# Patient Record
Sex: Male | Born: 1965 | Race: White | Hispanic: No | Marital: Single | Smoking: Never smoker
Health system: Southern US, Community
[De-identification: ages and names within clinical notes are randomized; demographics above are authoritative.]

## PROBLEM LIST (undated history)

## (undated) DIAGNOSIS — I7100 Dissection of unspecified site of aorta: Secondary | ICD-10-CM

## (undated) DIAGNOSIS — K219 Gastro-esophageal reflux disease without esophagitis: Secondary | ICD-10-CM

## (undated) DIAGNOSIS — I714 Abdominal aortic aneurysm, without rupture, unspecified: Secondary | ICD-10-CM

## (undated) DIAGNOSIS — I6529 Occlusion and stenosis of unspecified carotid artery: Secondary | ICD-10-CM

## (undated) DIAGNOSIS — I719 Aortic aneurysm of unspecified site, without rupture: Secondary | ICD-10-CM

## (undated) DIAGNOSIS — H269 Unspecified cataract: Secondary | ICD-10-CM

## (undated) DIAGNOSIS — N189 Chronic kidney disease, unspecified: Secondary | ICD-10-CM

## (undated) DIAGNOSIS — I639 Cerebral infarction, unspecified: Secondary | ICD-10-CM

## (undated) DIAGNOSIS — E786 Lipoprotein deficiency: Secondary | ICD-10-CM

## (undated) DIAGNOSIS — I10 Essential (primary) hypertension: Secondary | ICD-10-CM

## (undated) DIAGNOSIS — I480 Paroxysmal atrial fibrillation: Secondary | ICD-10-CM

## (undated) DIAGNOSIS — Z5189 Encounter for other specified aftercare: Secondary | ICD-10-CM

## (undated) DIAGNOSIS — D649 Anemia, unspecified: Secondary | ICD-10-CM

## (undated) DIAGNOSIS — G4730 Sleep apnea, unspecified: Secondary | ICD-10-CM

## (undated) DIAGNOSIS — Z952 Presence of prosthetic heart valve: Secondary | ICD-10-CM

## (undated) DIAGNOSIS — R911 Solitary pulmonary nodule: Secondary | ICD-10-CM

## (undated) DIAGNOSIS — E785 Hyperlipidemia, unspecified: Secondary | ICD-10-CM

## (undated) DIAGNOSIS — I499 Cardiac arrhythmia, unspecified: Secondary | ICD-10-CM

## (undated) DIAGNOSIS — F419 Anxiety disorder, unspecified: Secondary | ICD-10-CM

## (undated) DIAGNOSIS — D689 Coagulation defect, unspecified: Secondary | ICD-10-CM

## (undated) DIAGNOSIS — K760 Fatty (change of) liver, not elsewhere classified: Secondary | ICD-10-CM

## (undated) DIAGNOSIS — R011 Cardiac murmur, unspecified: Secondary | ICD-10-CM

## (undated) DIAGNOSIS — I4891 Unspecified atrial fibrillation: Secondary | ICD-10-CM

## (undated) DIAGNOSIS — I7103 Dissection of thoracoabdominal aorta: Secondary | ICD-10-CM

## (undated) DIAGNOSIS — F32A Depression, unspecified: Secondary | ICD-10-CM

## (undated) DIAGNOSIS — Z9889 Other specified postprocedural states: Secondary | ICD-10-CM

## (undated) DIAGNOSIS — E611 Iron deficiency: Secondary | ICD-10-CM

## (undated) DIAGNOSIS — Z7901 Long term (current) use of anticoagulants: Secondary | ICD-10-CM

## (undated) DIAGNOSIS — I509 Heart failure, unspecified: Secondary | ICD-10-CM

## (undated) DIAGNOSIS — I359 Nonrheumatic aortic valve disorder, unspecified: Secondary | ICD-10-CM

## (undated) DIAGNOSIS — I2510 Atherosclerotic heart disease of native coronary artery without angina pectoris: Secondary | ICD-10-CM

## (undated) HISTORY — DX: Iron deficiency: E61.1

## (undated) HISTORY — DX: Encounter for other specified aftercare: Z51.89

## (undated) HISTORY — DX: Depression, unspecified: F32.A

## (undated) HISTORY — DX: Coagulation defect, unspecified: D68.9

## (undated) HISTORY — DX: Dissection of thoracoabdominal aorta: I71.03

## (undated) HISTORY — DX: Sleep apnea, unspecified: G47.30

## (undated) HISTORY — DX: Anxiety disorder, unspecified: F41.9

## (undated) HISTORY — PX: EYE SURGERY: SHX253

## (undated) HISTORY — PX: CARDIAC VALVE REPLACEMENT: SHX585

## (undated) HISTORY — DX: Anemia, unspecified: D64.9

## (undated) HISTORY — DX: Cardiac murmur, unspecified: R01.1

## (undated) HISTORY — DX: Paroxysmal atrial fibrillation: I48.0

## (undated) HISTORY — DX: Unspecified cataract: H26.9

## (undated) HISTORY — DX: Long term (current) use of anticoagulants: Z79.01

## (undated) HISTORY — PX: ARTHROSCOPIC REPAIR ACL: SUR80

## (undated) HISTORY — DX: Abdominal aortic aneurysm, without rupture, unspecified: I71.40

## (undated) HISTORY — DX: Dissection of unspecified site of aorta: I71.00

## (undated) HISTORY — DX: Lipoprotein deficiency: E78.6

## (undated) HISTORY — DX: Cerebral infarction, unspecified: I63.9

## (undated) HISTORY — DX: Chronic kidney disease, unspecified: N18.9

## (undated) HISTORY — DX: Presence of prosthetic heart valve: Z95.2

## (undated) HISTORY — PX: ABDOMINAL SURGERY: SHX537

## (undated) HISTORY — DX: Hyperlipidemia, unspecified: E78.5

## (undated) HISTORY — DX: Solitary pulmonary nodule: R91.1

## (undated) HISTORY — DX: Occlusion and stenosis of unspecified carotid artery: I65.29

## (undated) HISTORY — DX: Nonrheumatic aortic valve disorder, unspecified: I35.9

## (undated) HISTORY — DX: Aortic aneurysm of unspecified site, without rupture: I71.9

## (undated) HISTORY — PX: ABDOMINAL AORTIC ANEURYSM REPAIR: SUR1152

## (undated) HISTORY — DX: Gastro-esophageal reflux disease without esophagitis: K21.9

## (undated) HISTORY — DX: Unspecified atrial fibrillation: I48.91

## (undated) HISTORY — PX: CORONARY ARTERY BYPASS GRAFT: SHX141

## (undated) HISTORY — DX: Other specified postprocedural states: Z98.890

## (undated) HISTORY — PX: CORONARY ANGIOPLASTY: SHX604

## (undated) HISTORY — PX: MENISCUS REPAIR: SHX5179

## (undated) HISTORY — DX: Cardiac arrhythmia, unspecified: I49.9

## (undated) HISTORY — DX: Fatty (change of) liver, not elsewhere classified: K76.0

## (undated) HISTORY — DX: Atherosclerotic heart disease of native coronary artery without angina pectoris: I25.10

## (undated) HISTORY — DX: Essential (primary) hypertension: I10

---

## 1999-06-16 HISTORY — PX: AORTIC VALVE REPLACEMENT: SHX41

## 2009-08-02 DIAGNOSIS — R7301 Impaired fasting glucose: Secondary | ICD-10-CM | POA: Insufficient documentation

## 2009-08-02 DIAGNOSIS — Z7901 Long term (current) use of anticoagulants: Secondary | ICD-10-CM | POA: Insufficient documentation

## 2011-06-30 DIAGNOSIS — I7103 Dissection of thoracoabdominal aorta: Secondary | ICD-10-CM | POA: Insufficient documentation

## 2013-10-16 DIAGNOSIS — E785 Hyperlipidemia, unspecified: Secondary | ICD-10-CM | POA: Insufficient documentation

## 2013-10-16 DIAGNOSIS — E782 Mixed hyperlipidemia: Secondary | ICD-10-CM | POA: Insufficient documentation

## 2014-03-15 DIAGNOSIS — Z952 Presence of prosthetic heart valve: Secondary | ICD-10-CM | POA: Insufficient documentation

## 2015-02-08 DIAGNOSIS — R001 Bradycardia, unspecified: Secondary | ICD-10-CM | POA: Insufficient documentation

## 2016-02-10 DIAGNOSIS — R3129 Other microscopic hematuria: Secondary | ICD-10-CM | POA: Insufficient documentation

## 2017-02-02 DIAGNOSIS — H53132 Sudden visual loss, left eye: Secondary | ICD-10-CM | POA: Insufficient documentation

## 2017-06-15 HISTORY — PX: COLONOSCOPY: SHX174

## 2018-02-16 DIAGNOSIS — E876 Hypokalemia: Secondary | ICD-10-CM | POA: Insufficient documentation

## 2018-02-17 DIAGNOSIS — K648 Other hemorrhoids: Secondary | ICD-10-CM | POA: Insufficient documentation

## 2018-08-13 DIAGNOSIS — S7012XA Contusion of left thigh, initial encounter: Secondary | ICD-10-CM | POA: Insufficient documentation

## 2018-11-12 DIAGNOSIS — R918 Other nonspecific abnormal finding of lung field: Secondary | ICD-10-CM | POA: Insufficient documentation

## 2018-12-12 DIAGNOSIS — R0683 Snoring: Secondary | ICD-10-CM | POA: Insufficient documentation

## 2019-10-08 DIAGNOSIS — R7989 Other specified abnormal findings of blood chemistry: Secondary | ICD-10-CM | POA: Insufficient documentation

## 2019-10-08 DIAGNOSIS — I480 Paroxysmal atrial fibrillation: Secondary | ICD-10-CM | POA: Insufficient documentation

## 2019-10-08 DIAGNOSIS — R778 Other specified abnormalities of plasma proteins: Secondary | ICD-10-CM | POA: Insufficient documentation

## 2019-10-08 DIAGNOSIS — R55 Syncope and collapse: Secondary | ICD-10-CM | POA: Insufficient documentation

## 2019-10-08 DIAGNOSIS — N182 Chronic kidney disease, stage 2 (mild): Secondary | ICD-10-CM | POA: Insufficient documentation

## 2019-10-08 DIAGNOSIS — G4733 Obstructive sleep apnea (adult) (pediatric): Secondary | ICD-10-CM | POA: Insufficient documentation

## 2019-10-08 DIAGNOSIS — Z8679 Personal history of other diseases of the circulatory system: Secondary | ICD-10-CM | POA: Insufficient documentation

## 2019-10-18 DIAGNOSIS — U071 COVID-19: Secondary | ICD-10-CM | POA: Insufficient documentation

## 2019-10-18 DIAGNOSIS — R791 Abnormal coagulation profile: Secondary | ICD-10-CM | POA: Insufficient documentation

## 2020-01-05 DIAGNOSIS — R079 Chest pain, unspecified: Secondary | ICD-10-CM | POA: Insufficient documentation

## 2020-01-30 DIAGNOSIS — S060X0A Concussion without loss of consciousness, initial encounter: Secondary | ICD-10-CM | POA: Insufficient documentation

## 2020-02-05 DIAGNOSIS — K625 Hemorrhage of anus and rectum: Secondary | ICD-10-CM | POA: Insufficient documentation

## 2020-02-07 DIAGNOSIS — Z8673 Personal history of transient ischemic attack (TIA), and cerebral infarction without residual deficits: Secondary | ICD-10-CM | POA: Insufficient documentation

## 2020-02-08 DIAGNOSIS — D62 Acute posthemorrhagic anemia: Secondary | ICD-10-CM | POA: Insufficient documentation

## 2020-02-16 DIAGNOSIS — H9319 Tinnitus, unspecified ear: Secondary | ICD-10-CM | POA: Insufficient documentation

## 2020-02-16 DIAGNOSIS — K5790 Diverticulosis of intestine, part unspecified, without perforation or abscess without bleeding: Secondary | ICD-10-CM | POA: Insufficient documentation

## 2020-03-12 DIAGNOSIS — F0781 Postconcussional syndrome: Secondary | ICD-10-CM | POA: Insufficient documentation

## 2020-03-21 DIAGNOSIS — H40013 Open angle with borderline findings, low risk, bilateral: Secondary | ICD-10-CM | POA: Insufficient documentation

## 2020-03-21 DIAGNOSIS — H2520 Age-related cataract, morgagnian type, unspecified eye: Secondary | ICD-10-CM | POA: Insufficient documentation

## 2020-05-21 DIAGNOSIS — H59022 Cataract (lens) fragments in eye following cataract surgery, left eye: Secondary | ICD-10-CM | POA: Insufficient documentation

## 2020-11-20 DIAGNOSIS — N3001 Acute cystitis with hematuria: Secondary | ICD-10-CM | POA: Insufficient documentation

## 2020-11-20 DIAGNOSIS — K5901 Slow transit constipation: Secondary | ICD-10-CM | POA: Insufficient documentation

## 2020-11-20 HISTORY — DX: Acute cystitis with hematuria: N30.01

## 2021-08-14 DIAGNOSIS — I219 Acute myocardial infarction, unspecified: Secondary | ICD-10-CM | POA: Insufficient documentation

## 2021-08-14 DIAGNOSIS — I517 Cardiomegaly: Secondary | ICD-10-CM | POA: Insufficient documentation

## 2021-08-14 DIAGNOSIS — G459 Transient cerebral ischemic attack, unspecified: Secondary | ICD-10-CM | POA: Insufficient documentation

## 2021-08-14 DIAGNOSIS — I10 Essential (primary) hypertension: Secondary | ICD-10-CM | POA: Insufficient documentation

## 2021-08-14 DIAGNOSIS — I7100 Dissection of unspecified site of aorta: Secondary | ICD-10-CM | POA: Insufficient documentation

## 2021-08-14 DIAGNOSIS — I7789 Other specified disorders of arteries and arterioles: Secondary | ICD-10-CM | POA: Insufficient documentation

## 2021-08-15 ENCOUNTER — Other Ambulatory Visit: Payer: Self-pay

## 2021-08-15 ENCOUNTER — Encounter: Payer: Self-pay | Admitting: Internal Medicine

## 2021-08-15 ENCOUNTER — Ambulatory Visit (INDEPENDENT_AMBULATORY_CARE_PROVIDER_SITE_OTHER): Payer: Medicaid Other | Admitting: Internal Medicine

## 2021-08-15 ENCOUNTER — Other Ambulatory Visit
Admission: RE | Admit: 2021-08-15 | Discharge: 2021-08-15 | Disposition: A | Discharge status: Home / Self Care | Payer: Medicaid Other | Source: Ambulatory Visit | Attending: Internal Medicine | Admitting: Internal Medicine

## 2021-08-15 VITALS — BP 120/78 | HR 61 | Ht 68.0 in | Wt 176.2 lb

## 2021-08-15 DIAGNOSIS — I719 Aortic aneurysm of unspecified site, without rupture: Secondary | ICD-10-CM | POA: Insufficient documentation

## 2021-08-15 DIAGNOSIS — I480 Paroxysmal atrial fibrillation: Secondary | ICD-10-CM

## 2021-08-15 DIAGNOSIS — I10 Essential (primary) hypertension: Secondary | ICD-10-CM | POA: Diagnosis present

## 2021-08-15 DIAGNOSIS — I7100 Dissection of unspecified site of aorta: Secondary | ICD-10-CM | POA: Insufficient documentation

## 2021-08-15 DIAGNOSIS — Z7901 Long term (current) use of anticoagulants: Secondary | ICD-10-CM | POA: Diagnosis present

## 2021-08-15 DIAGNOSIS — I219 Acute myocardial infarction, unspecified: Secondary | ICD-10-CM | POA: Insufficient documentation

## 2021-08-15 DIAGNOSIS — I252 Old myocardial infarction: Secondary | ICD-10-CM | POA: Diagnosis not present

## 2021-08-15 DIAGNOSIS — Z952 Presence of prosthetic heart valve: Secondary | ICD-10-CM | POA: Insufficient documentation

## 2021-08-15 DIAGNOSIS — I38 Endocarditis, valve unspecified: Secondary | ICD-10-CM | POA: Insufficient documentation

## 2021-08-15 LAB — LIPID PANEL
Cholesterol: 88 mg/dL (ref 0–200)
HDL: 31 mg/dL — ABNORMAL LOW (ref >40)
LDL Cholesterol: 24 mg/dL (ref 0–99)
Total CHOL/HDL Ratio: 2.8 RATIO
Triglycerides: 165 mg/dL — ABNORMAL HIGH (ref <150)
VLDL: 33 mg/dL (ref 0–40)

## 2021-08-15 LAB — COMPREHENSIVE METABOLIC PANEL
ALT: 18 U/L (ref 0–44)
AST: 26 U/L (ref 15–41)
Albumin: 3.9 g/dL (ref 3.5–5.0)
Alkaline Phosphatase: 49 U/L (ref 38–126)
Anion gap: 3 — ABNORMAL LOW (ref 5–15)
BUN: 13 mg/dL (ref 6–20)
CO2: 28 mmol/L (ref 22–32)
Calcium: 8.7 mg/dL — ABNORMAL LOW (ref 8.9–10.3)
Chloride: 105 mmol/L (ref 98–111)
Creatinine, Ser: 1.07 mg/dL (ref 0.61–1.24)
GFR, Estimated: >60 mL/min (ref >60)
Glucose, Bld: 104 mg/dL — ABNORMAL HIGH (ref 70–99)
Potassium: 3.7 mmol/L (ref 3.5–5.1)
Sodium: 136 mmol/L (ref 135–145)
Total Bilirubin: 0.7 mg/dL (ref 0.3–1.2)
Total Protein: 6.9 g/dL (ref 6.5–8.1)

## 2021-08-15 LAB — CBC
HCT: 37.8 % — ABNORMAL LOW (ref 39.0–52.0)
Hemoglobin: 12.7 g/dL — ABNORMAL LOW (ref 13.0–17.0)
MCH: 31.1 pg (ref 26.0–34.0)
MCHC: 33.6 g/dL (ref 30.0–36.0)
MCV: 92.4 fL (ref 80.0–100.0)
Platelets: 134 10*3/uL — ABNORMAL LOW (ref 150–400)
RBC: 4.09 MIL/uL — ABNORMAL LOW (ref 4.22–5.81)
RDW: 14.5 % (ref 11.5–15.5)
WBC: 5.7 10*3/uL (ref 4.0–10.5)
nRBC: 0 % (ref 0.0–0.2)

## 2021-08-15 LAB — PROTIME-INR
INR: 2.7 — ABNORMAL HIGH (ref 0.8–1.2)
Prothrombin Time: 28.5 seconds — ABNORMAL HIGH (ref 11.4–15.2)

## 2021-08-15 MED ORDER — LISINOPRIL 10 MG PO TABS: 10.0000 mg | ORAL_TABLET | Freq: Every day | ORAL | 1 refills | Status: DC

## 2021-08-15 NOTE — Progress Notes (Signed)
? ?New Outpatient Visit ?Date: 08/15/2021 ? ?Primary Care provider: ?None ? ?Chief Complaint: Establish cardiology care ? ?HPI:  Ryan [name] is a 56 y.o. male who is being seen today to establish cardiology care after moving to the area from [place]. 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 ? ?Today, Ryan [name] reports that he feels fairly well.  He has experienced chronic, daily chest pain since his aortic dissection repair and AVR in 2001.  He notes that it is sometimes a pressure-like sensation and sometimes a sharp pain.  The discomfort comes and goes.  If it gets up to 7/10 in intensity, he starts to get worried.  He underwent catheterization for evaluation of this same pain a year ago and was found to have no CAD.  He did have a remote lateral wall MI seen on cardiac MRI in the absence of angiographic CAD.  He denies shortness of breath, palpitations, lightheadedness, and edema.  His last INR was checked in [place] on 07/22/2021 and was therapeutic at 2.3. ? ?-------------------------------------------------------------------------------------------------- ? ?Cardiovascular History & Procedures: ?Cardiovascular Problems: ?Aortic dissection status post root repair and mechanical AVR with residual chronic dissection extending from innominate artery through the aortic bifurcation ?Stroke ?Chronic left bundle branch block ? ?Risk Factors: ?Stroke, hypertension, hyperlipidemia, and male gender ? ?Cath/PCI: ?LHC (07/22/2020, [facility]): LMCA normal.  LAD normal.  LCx normal.  Dominant RCA normal. ? ?CV Surgery: ?Aortic root repair and mechanical AVR  (2001), ? ?EP Procedures and Devices: ?None ? ?Non-Invasive Evaluation(s): ?CTA chest, abdomen, and pelvis (04/25/2021, [facility]): Patient status post AVR and ascending aortic graft.  Residual dissection in the arch to the level of the bilateral common iliac arteries.  Aortic root measures up to 5.0 cm.  Arch and descending aorta measure up to 4.8 cm.  Findings are stable from prior study in 01/2021. ?TTE (02/05/2021, [facility]): Technically difficult study.  Mildly dilated left ventricle with LVEF 55-60% with abnormal septal motion.  Moderate left atrial enlargement.  Normal mechanical AVR gradient (mean gradient 7 mmHg) with mild regurgitation. ? ?-------------------------------------------------------------------------------------------------- ? ?Past Medical History:  ?Diagnosis Date  ? Anemia   ? Aortic aneurysm and dissection ([place])   ? Aortic valvular disease   ? Arrhythmia   ? Atrial fibrillation ([place])   ? Chronic anticoagulation   ? Coronary atherosclerosis   ? Dissecting aortic aneurysm, thoracoabdominal ([place])   ? Fatty liver   ? GERD (gastroesophageal reflux disease)   ? Heart murmur   ? Hyperlipidemia   ? Hypertension   ? Hypolipidemia   ? Iron deficiency   ? Pulmonary nodule   ? Sleep apnea   ? Stroke [facility])   ? ? ?Past Surgical History:  ?Procedure Laterality Date  ? ABDOMINAL AORTIC ANEURYSM REPAIR    ? AORTIC VALVE REPLACEMENT  2001  ? St Jude Mechanical  ? ARTHROSCOPIC REPAIR ACL    ? COLONOSCOPY  2019  ? MENISCUS REPAIR    ? ? ?Current Meds  ?Medication Sig  ? acetaminophen (TYLENOL) 500 MG tablet Take 500 mg by mouth every 6 (six) hours as needed.  ? aspirin 81 MG EC tablet Take 81 mg by mouth daily.  ? Ferrous Sulfate Dried (HIGH POTENCY IRON)  65 MG TABS Take 650 mg by mouth daily.  ? lisinopril (ZESTRIL) 10 MG tablet Take 10 mg by mouth daily.  ? lovastatin (MEVACOR) 20 MG tablet Take 20 mg by mouth at bedtime.  ? Potassium 99 MG TABS Take 99 mg by mouth every evening.  ? warfarin  (COUMADIN) 5 MG tablet 5 mg take 2.5 mg all days except Sat and Wed take 5 mg or as directed by anticoagulation clinic.  ? ? ?Allergies: Alitraq, Iodinated contrast media, and Iodine ? ?Social History  ? ?Tobacco Use  ? Smoking status: Never  ?  Passive exposure: Past  ? Smokeless tobacco: Never  ?Vaping Use  ? Vaping Use: Never used  ?Substance Use Topics  ? Alcohol use: Yes  ?  Comment: 2 mixed drinks per year  ? Drug use: Never  ? ? ?Family History  ?Problem Relation Age of Onset  ? Heart attack Mother   ? Hypertension Mother   ? Hyperlipidemia Mother   ? Heart disease Mother   ? Heart attack Maternal Grandfather   ? ? ?Review of Systems: ?A 12-system review of systems was performed and was negative except as noted in the HPI. ? ?-------------------------------------------------------------------------------------------------- ? ?Physical Exam: ?BP 120/78 (BP Location: Right Arm, Patient Position: Sitting, Cuff Size: Normal)   Pulse 61   Ht 5\' 8"  (1.727 m)   Wt 176 lb 4 oz (79.9 kg)   SpO2 98%   BMI 26.80 kg/m?  ? ?General: NAD. ?HEENT: No conjunctival pallor or scleral icterus. Facemask in place. ?Neck: Supple without lymphadenopathy, thyromegaly, JVD, or HJR. No carotid bruit. ?Lungs: Normal work of breathing. Clear to auscultation bilaterally without wheezes or crackles. ?Heart: Regular rate and rhythm with 3/6 systolic murmur and mechanical S2.  No rubs or gallops.  Non-displaced PMI. ?Abd: Bowel sounds present. Soft, NT/ND without hepatosplenomegaly ?Ext: No lower extremity edema. Radial, PT, and DP pulses are 2+ bilaterally ?Skin: Warm and dry without rash. ?Neuro: CNIII-XII intact. Strength and fine-touch sensation intact in upper and lower extremities bilaterally. ?Psych: Normal mood and affect. ? ?EKG: Normal sinus rhythm with left bundle branch block. ? ?Outside labs: ?CBC (04/25/2021): WBC 4.3, Hgb 12.9, HCT 37.0, PLT 166 ? ?CMP (04/25/2021): Sodium 140, potassium 3.0, chloride 110, CO2 27, BUN  10, creatinine 1.1, calcium 8.2, AST 20, ALT 25, alkaline phosphatase 69, total bilirubin 0.8, total protein 6.5, albumin 3.6 ? ?Lipid panel (02/04/2021): Total cholesterol 67, triglycerides 121, HDL 27, LDL 16 ? ?TSH (02/04/2021): 1.58 ? ?INR (07/22/2021): 2.3 ? ?-------------------------------------------------------------------------------------------------- ? ?ASSESSMENT AND PLAN: ?Aortic aneurysm and dissection status post repair and AVR: ?Patient status post aortic root repair and mechanical AVR for management of aortic dissection.  There is a residual dissection flap extending from the innominate artery to the aortic bifurcation that has been monitored by cardiothoracic surgery.  We will continue current medications including aspirin and warfarin.  We will check a CBC and INR today as well as refer Ryan [name] to cardiac surgery for ongoing surveillance.  We will also arrange for long-term management of his anticoagulation in our clinic. ? ?History of MI: ?Query if this may be cardioembolic given absence of atherosclerotic CAD but evidence of scar on remote cardiac MRI.  Continue aspirin and statin therapy.  I will recheck a lipid panel and CMP today. ? ?Hypertension: ?Blood pressure well controlled today.  Continue lisinopril. ? ?Paroxysmal atrial fibrillation: ?No palpitations reported.  EKG today shows sinus rhythm with chronic left bundle branch block.  Continue anticoagulation with  warfarin in the setting of mechanical AVR. ? ?Follow-up: Return to clinic in 6 months. ? ?Nelva Bush, MD ?08/15/2021 ?4:07 PM ? ?

## 2021-08-15 NOTE — Patient Instructions (Signed)
Medication Instructions:  ? ?Your physician recommends that you continue on your current medications as directed. Please refer to the Current Medication list given to you today. ? ?*If you need a refill on your cardiac medications before your next appointment, please call your pharmacy* ? ? ?Lab Work: ? ?Today at the medical mall at [facility]: CBC, CMET, Lipid panel, PT/INR ? ?If you have labs (blood work) drawn today and your tests are completely normal, you will receive your results only by: ?MyChart Message (if you have MyChart) OR ?A paper copy in the mail ?If you have any lab test that is abnormal or we need to change your treatment, we will call you to review the results. ? ? ?Testing/Procedures: ? ?None ordered ? ? ?Follow-Up: ?At [facility], you and your health needs are our priority.  As part of our continuing mission to provide you with exceptional heart care, we have created designated Provider Care Teams.  These Care Teams include your primary Cardiologist (physician) and Advanced Practice Providers (APPs -  Physician Assistants and Nurse Practitioners) who all work together to provide you with the care you need, when you need it. ? ?We recommend signing up for the patient portal called "MyChart".  Sign up information is provided on this After Visit Summary.  MyChart is used to connect with patients for Virtual Visits (Telemedicine).  Patients are able to view lab/test results, encounter notes, upcoming appointments, etc.  Non-urgent messages can be sent to your provider as well.   ?To learn more about what you can do with MyChart, go to [email or web address].   ? ?Your next appointment:   ? ?1) Scheduled with coumadin clinic ~08/20/21 or the following week ? ?2) 6 month(s) with Dr. Okey Dupre or APP ? ?The format for your next appointment:   ?In Person ? ?Provider:   ?You may see Dr. Cristal Deer End or one of the following Advanced Practice Providers on your designated Care Team:   ?Nicolasa Ducking, NP ?Eula Listen, PA-C ?Cadence Fransico Michael, PA-C ? ? ?Other Instructions ? ?You have been referred to cardiothoracic surgery to follow up thoracic aortic dissection.  ?You will receive a call from their office to schedule appointment.  ? ?

## 2021-08-20 ENCOUNTER — Ambulatory Visit (INDEPENDENT_AMBULATORY_CARE_PROVIDER_SITE_OTHER): Payer: Medicaid Other

## 2021-08-20 ENCOUNTER — Other Ambulatory Visit: Payer: Self-pay

## 2021-08-20 DIAGNOSIS — Z952 Presence of prosthetic heart valve: Secondary | ICD-10-CM

## 2021-08-20 DIAGNOSIS — Z7901 Long term (current) use of anticoagulants: Secondary | ICD-10-CM

## 2021-08-20 DIAGNOSIS — Z5181 Encounter for therapeutic drug level monitoring: Secondary | ICD-10-CM

## 2021-08-20 LAB — POCT INR: INR: 3 (ref 2.0–3.0)

## 2021-08-20 NOTE — Patient Instructions (Signed)
-   continue current dosage of warfarin 5 mg - 1/2 tablet every day EXCEPT 1 whole tablet on MONDAYS, WEDNESDAYS & SATURDAYS. ?- Recheck INR in 4 weeks ?

## 2021-08-26 ENCOUNTER — Other Ambulatory Visit: Payer: Self-pay

## 2021-08-26 ENCOUNTER — Observation Stay
Admission: EM | Admit: 2021-08-26 | Discharge: 2021-08-27 | Disposition: A | Discharge status: Home / Self Care | Payer: Medicaid Other | Attending: Hospitalist | Admitting: Hospitalist

## 2021-08-26 ENCOUNTER — Emergency Department: Payer: Medicaid Other

## 2021-08-26 DIAGNOSIS — I480 Paroxysmal atrial fibrillation: Secondary | ICD-10-CM | POA: Diagnosis present

## 2021-08-26 DIAGNOSIS — G459 Transient cerebral ischemic attack, unspecified: Secondary | ICD-10-CM | POA: Diagnosis not present

## 2021-08-26 DIAGNOSIS — Z7901 Long term (current) use of anticoagulants: Secondary | ICD-10-CM | POA: Insufficient documentation

## 2021-08-26 DIAGNOSIS — I716 Thoracoabdominal aortic aneurysm, without rupture, unspecified: Secondary | ICD-10-CM | POA: Diagnosis not present

## 2021-08-26 DIAGNOSIS — Z20822 Contact with and (suspected) exposure to covid-19: Secondary | ICD-10-CM | POA: Diagnosis not present

## 2021-08-26 DIAGNOSIS — K219 Gastro-esophageal reflux disease without esophagitis: Secondary | ICD-10-CM | POA: Insufficient documentation

## 2021-08-26 DIAGNOSIS — I10 Essential (primary) hypertension: Secondary | ICD-10-CM | POA: Diagnosis not present

## 2021-08-26 DIAGNOSIS — Z7982 Long term (current) use of aspirin: Secondary | ICD-10-CM | POA: Diagnosis not present

## 2021-08-26 DIAGNOSIS — Z79899 Other long term (current) drug therapy: Secondary | ICD-10-CM | POA: Diagnosis not present

## 2021-08-26 DIAGNOSIS — Z952 Presence of prosthetic heart valve: Secondary | ICD-10-CM

## 2021-08-26 DIAGNOSIS — R202 Paresthesia of skin: Secondary | ICD-10-CM | POA: Diagnosis present

## 2021-08-26 LAB — URINE DRUG SCREEN, QUALITATIVE (ARMC ONLY)
Amphetamines, Ur Screen: NOT DETECTED
Barbiturates, Ur Screen: NOT DETECTED
Benzodiazepine, Ur Scrn: NOT DETECTED
Cannabinoid 50 Ng, Ur ~~LOC~~: NOT DETECTED
Cocaine Metabolite,Ur ~~LOC~~: NOT DETECTED
MDMA (Ecstasy)Ur Screen: NOT DETECTED
Methadone Scn, Ur: NOT DETECTED
Opiate, Ur Screen: NOT DETECTED
Phencyclidine (PCP) Ur S: NOT DETECTED
Tricyclic, Ur Screen: NOT DETECTED

## 2021-08-26 LAB — DIFFERENTIAL
Abs Immature Granulocytes: 0.04 10*3/uL (ref 0.00–0.07)
Basophils Absolute: 0 10*3/uL (ref 0.0–0.1)
Basophils Relative: 0 %
Eosinophils Absolute: 0.1 10*3/uL (ref 0.0–0.5)
Eosinophils Relative: 2 %
Immature Granulocytes: 1 %
Lymphocytes Relative: 25 %
Lymphs Abs: 1.2 10*3/uL (ref 0.7–4.0)
Monocytes Absolute: 0.3 10*3/uL (ref 0.1–1.0)
Monocytes Relative: 7 %
Neutro Abs: 3 10*3/uL (ref 1.7–7.7)
Neutrophils Relative %: 65 %

## 2021-08-26 LAB — PROTIME-INR
INR: 2.3 — ABNORMAL HIGH (ref 0.8–1.2)
Prothrombin Time: 25 seconds — ABNORMAL HIGH (ref 11.4–15.2)

## 2021-08-26 LAB — CBC
HCT: 36.7 % — ABNORMAL LOW (ref 39.0–52.0)
Hemoglobin: 12.3 g/dL — ABNORMAL LOW (ref 13.0–17.0)
MCH: 30.8 pg (ref 26.0–34.0)
MCHC: 33.5 g/dL (ref 30.0–36.0)
MCV: 92 fL (ref 80.0–100.0)
Platelets: 176 10*3/uL (ref 150–400)
RBC: 3.99 MIL/uL — ABNORMAL LOW (ref 4.22–5.81)
RDW: 14.2 % (ref 11.5–15.5)
WBC: 4.7 10*3/uL (ref 4.0–10.5)
nRBC: 0 % (ref 0.0–0.2)

## 2021-08-26 LAB — ETHANOL: Alcohol, Ethyl (B): <10 mg/dL (ref <10)

## 2021-08-26 LAB — COMPREHENSIVE METABOLIC PANEL
ALT: 17 U/L (ref 0–44)
AST: 27 U/L (ref 15–41)
Albumin: 3.5 g/dL (ref 3.5–5.0)
Alkaline Phosphatase: 50 U/L (ref 38–126)
Anion gap: 5 (ref 5–15)
BUN: 11 mg/dL (ref 6–20)
CO2: 27 mmol/L (ref 22–32)
Calcium: 8.2 mg/dL — ABNORMAL LOW (ref 8.9–10.3)
Chloride: 101 mmol/L (ref 98–111)
Creatinine, Ser: 0.92 mg/dL (ref 0.61–1.24)
GFR, Estimated: >60 mL/min (ref >60)
Glucose, Bld: 106 mg/dL — ABNORMAL HIGH (ref 70–99)
Potassium: 3.4 mmol/L — ABNORMAL LOW (ref 3.5–5.1)
Sodium: 133 mmol/L — ABNORMAL LOW (ref 135–145)
Total Bilirubin: 0.7 mg/dL (ref 0.3–1.2)
Total Protein: 6.6 g/dL (ref 6.5–8.1)

## 2021-08-26 LAB — URINALYSIS, ROUTINE W REFLEX MICROSCOPIC
Bacteria, UA: NONE SEEN
Bilirubin Urine: NEGATIVE
Glucose, UA: NEGATIVE mg/dL
Ketones, ur: NEGATIVE mg/dL
Leukocytes,Ua: NEGATIVE
Nitrite: NEGATIVE
Protein, ur: NEGATIVE mg/dL
Specific Gravity, Urine: 1.019 (ref 1.005–1.030)
Squamous Epithelial / HPF: NONE SEEN (ref 0–5)
pH: 5 (ref 5.0–8.0)

## 2021-08-26 LAB — APTT: aPTT: 38 seconds — ABNORMAL HIGH (ref 24–36)

## 2021-08-26 LAB — CBG MONITORING, ED: Glucose-Capillary: 112 mg/dL — ABNORMAL HIGH (ref 70–99)

## 2021-08-26 MED ORDER — PANTOPRAZOLE SODIUM 40 MG PO TBEC
40.0000 mg | DELAYED_RELEASE_TABLET | Freq: Every day | ORAL | Status: DC
  Administered 2021-08-27: 40 mg via ORAL
  Filled 2021-08-26: qty 1

## 2021-08-26 MED ORDER — SENNOSIDES-DOCUSATE SODIUM 8.6-50 MG PO TABS: 1.0000 | ORAL_TABLET | Freq: Every evening | ORAL | Status: DC | PRN

## 2021-08-26 MED ORDER — POTASSIUM CHLORIDE 10 MEQ/100ML IV SOLN: 10.0000 meq | Freq: Once | INTRAVENOUS | Status: DC

## 2021-08-26 MED ORDER — ACETAMINOPHEN 325 MG PO TABS
650.0000 mg | ORAL_TABLET | ORAL | Status: DC | PRN
  Administered 2021-08-26: 650 mg via ORAL
  Filled 2021-08-26: qty 2

## 2021-08-26 MED ORDER — SODIUM CHLORIDE 0.9 % IV SOLN: INTRAVENOUS | Status: DC

## 2021-08-26 MED ORDER — ACETAMINOPHEN 650 MG RE SUPP: 650.0000 mg | RECTAL | Status: DC | PRN

## 2021-08-26 MED ORDER — WARFARIN SODIUM 2.5 MG PO TABS: 2.5000 mg | ORAL_TABLET | ORAL | Status: DC

## 2021-08-26 MED ORDER — ASPIRIN EC 81 MG PO TBEC
81.0000 mg | DELAYED_RELEASE_TABLET | Freq: Every day | ORAL | Status: DC
  Administered 2021-08-27: 81 mg via ORAL
  Filled 2021-08-26: qty 1

## 2021-08-26 MED ORDER — WARFARIN SODIUM 5 MG PO TABS
5.0000 mg | ORAL_TABLET | ORAL | Status: DC
  Filled 2021-08-26: qty 1

## 2021-08-26 MED ORDER — STROKE: EARLY STAGES OF RECOVERY BOOK
Freq: Once | Status: AC
  Administered 2021-08-26: 1

## 2021-08-26 MED ORDER — ACETAMINOPHEN 160 MG/5ML PO SOLN
650.0000 mg | ORAL | Status: DC | PRN
  Filled 2021-08-26: qty 20.3

## 2021-08-26 MED ORDER — WARFARIN SODIUM 2.5 MG PO TABS
1.2500 mg | ORAL_TABLET | Freq: Once | ORAL | Status: AC
  Administered 2021-08-27: 1.25 mg via ORAL
  Filled 2021-08-26 (×2): qty 0.5

## 2021-08-26 MED ORDER — POTASSIUM CHLORIDE CRYS ER 20 MEQ PO TBCR
20.0000 meq | EXTENDED_RELEASE_TABLET | Freq: Once | ORAL | Status: AC
  Administered 2021-08-26: 20 meq via ORAL
  Filled 2021-08-26: qty 1

## 2021-08-26 MED ORDER — WARFARIN - PHARMACIST DOSING INPATIENT
Freq: Every day | Status: DC
  Filled 2021-08-26: qty 1

## 2021-08-26 MED ORDER — PRAVASTATIN SODIUM 20 MG PO TABS: 20.0000 mg | ORAL_TABLET | Freq: Every day | ORAL | Status: DC

## 2021-08-26 MED ORDER — LISINOPRIL 10 MG PO TABS
10.0000 mg | ORAL_TABLET | Freq: Every day | ORAL | Status: DC
  Administered 2021-08-27: 10 mg via ORAL
  Filled 2021-08-26: qty 1

## 2021-08-26 NOTE — ED Notes (Signed)
Neurologist speaking with pt via teleneurology ?

## 2021-08-26 NOTE — ED Notes (Signed)
Teleneurology on at the bedside, called for RN to provide assessment then be transferred to neurologist ?

## 2021-08-26 NOTE — Consult Note (Signed)
[facility] ?[facility] TeleNeurology Consult Services ? ? ?Patient Name:   Ryan [name], Ryan [name] ?Date of Birth:   16-May-1966 ?Identification Number:   MRN - [id number] ?Date of Service:   08/26/2021 20:56:31 ? ?Diagnosis: ?      G45.9 - Transient cerebral ischemic attack, unspecified ? ?Impression: ?     Patient presents for transient right upper extremity paresthesias which have since resolved. No tPA due to resolution of symptoms as well as on Coumadin with recent therapeutic INR. Concern for potential recurrent TIA. Recommend continuation of aspirin and Coumadin, admission for MRI to rule out acute ischemic stroke, echocardiogram, neuro follow-up. ? ?Our recommendations are outlined below. ? ?Recommendations: ? ?      Stroke/Telemetry Floor ?      Neuro Checks ?      Bedside Swallow Eval ?      DVT Prophylaxis ?      IV Fluids, Normal Saline ?      Head of Bed 30 Degrees ?      Euglycemia and Avoid Hyperthermia (PRN Acetaminophen) ?      Toxic/metabolic/infx workup per ED including UA, UDS ?      MRI brain without IV contrast ?      If no cerebral blood vessel imaging done in ED (such as CTA), recommend MRA head/neck without contrast, or carotid ultrasound if cannot obtain MRA ?      TSH, A1c, lipid profile ?      Transthoracic echocardiogram ?      Continuous telemetry ?      Physical, occupational, and speech therapies ?      q4h neuro checks/NIHSS ?      NPO until bedside swallow ?      Neurology follow-up ? ?Routine Consultation with [facility] for Follow up Care ? ?Sign Out: ?      Discussed with Emergency Department Provider ? ? ? ?------------------------------------------------------------------------------ ? ?Advanced Imaging: ?Advanced Imaging Not Completed because: ? ?does not meet criteria due to NIHSS <6 and no cortical signs ? ? ?Metrics: ?Last Known Well: 08/26/2021 19:15:00 ?[facility] Notification Time: 08/26/2021 20:56:31 ?Arrival Time: 08/26/2021 20:14:00 ?Stamp Time: 08/26/2021  20:56:31 ?Initial Response Time: 08/26/2021 20:58:30 ?Symptoms: RUE parethesias. ?NIHSS Start Assessment Time: 08/26/2021 21:05:11 ?Patient is not a candidate for Thrombolytic. ?Thrombolytic Medical Decision: 08/26/2021 21:05:14 ?Patient was not deemed candidate for Thrombolytic because of following reasons: ?Coagulopathy. ?Resolved symptoms (no residual disabling symptoms). ? ?I personally Reviewed the CT Head and it Showed ? ?ED Physician notified of diagnostic impression and management plan on 08/26/2021 21:08:47 ? ? ? ?------------------------------------------------------------------------------ ? ?History of Present Illness: ?Patient is a 56 year old Male. ? ?Patient was brought by private transportation with symptoms of RUE parethesias. ?Patient with a history of an aortic dissection in 2001 that was fixed surgically, aortic valve replacement, prior strokes and TIAs, on Coumadin last INR 2.7 in early March 2023, presents to the hospital for transient right upper extremity paresthesias that lasted about 45 minutes this evening and have now completely resolved. Denies any other symptoms. His NIH stroke scale is a 0. Reports that his previous TIAs presented similarly. ? ? ?Past Medical History: ?     Coronary Artery Disease ?     Stroke ?[place] PMH:  TIA x3, AVR, aortic dissection ? ?Medications: ? ?Anticoagulant use:  Yes coumadin ?Antiplatelet use: Yes asa ?Reviewed EMR for current medications ? ?Allergies:  ?Reviewed ? ?Social History: ?Drug Use: No ? ?Family History: ? ?There is no family history of premature  cerebrovascular disease pertinent to this consultation ? ?ROS : ?14 Points Review of Systems was performed and was negative except mentioned in HPI. ? ?Past Surgical History: ?There Is No Surgical History Contributory To Today?s Visit ? ? ? ?Examination: ?BP(114/71), Pulse(80), Blood Glucose(112) ?1A: Level of Consciousness - Alert; keenly responsive + 0 ?1B: Ask Month and Age - Both Questions Right +  0 ?1C: Blink Eyes & Squeeze Hands - Performs Both Tasks + 0 ?2: Test Horizontal Extraocular Movements - Normal + 0 ?3: Test Visual Fields - No Visual Loss + 0 ?4: Test Facial Palsy (Use Grimace if Obtunded) - Normal symmetry + 0 ?5A: Test Left Arm Motor Drift - No Drift for 10 Seconds + 0 ?5B: Test Right Arm Motor Drift - No Drift for 10 Seconds + 0 ?6A: Test Left Leg Motor Drift - No Drift for 5 Seconds + 0 ?6B: Test Right Leg Motor Drift - No Drift for 5 Seconds + 0 ?7: Test Limb Ataxia (FNF/Heel-Shin) - No Ataxia + 0 ?8: Test Sensation - Normal; No sensory loss + 0 ?9: Test Language/Aphasia - Normal; No aphasia + 0 ?10: Test Dysarthria - Normal + 0 ?11: Test Extinction/Inattention - No abnormality + 0 ? ?NIHSS Score: 0 ? ? ?Pre-Morbid Modified Rankin Scale: ?0 Points = No symptoms at all ? ? ?Patient/Family was informed the Neurology Consult would occur via TeleHealth consult by way of interactive audio and video telecommunications and consented to receiving care in this manner. ? ? ?Patient is being evaluated for possible acute neurologic impairment and high probability of imminent or life-threatening deterioration. I spent total of 35 minutes providing care to this patient, including time for face to face visit via telemedicine, review of medical records, imaging studies and discussion of findings with providers, the patient and/or family. ? ? ?Dr Knox Royalty ? ? ?[facility] ?[phone] ? ? ?Case [id number] ? ?

## 2021-08-26 NOTE — ED Notes (Signed)
CODE STROKE CALLED BY MD ?

## 2021-08-26 NOTE — Progress Notes (Addendum)
ANTICOAGULATION CONSULT NOTE - Initial Consult ? ?Pharmacy Consult for  Warfarin  ?Indication:  Mechanical heart valve  ? ?Allergies  ?Allergen Reactions  ? Alitraq Rash  ? Iodinated Contrast Media Rash  ? Iodine Rash  ?  CT dye ?CT dye ? ?CT dye ?CT dye  ? ? ?Patient Measurements: ?  ?Heparin Dosing Weight:  ? ?Vital Signs: ?Temp: 97.9 ?F (36.6 ?C) (03/14 2021) ?Temp Source: Oral (03/14 2021) ?BP: 106/70 (03/14 2215) ?Pulse Rate: 54 (03/14 2215) ? ?Labs: ?Recent Labs  ?  08/26/21 ?2036  ?HGB 12.3*  ?HCT 36.7*  ?PLT 176  ?APTT 38*  ?LABPROT 25.0*  ?INR 2.3*  ?CREATININE 0.92  ? ? ?Estimated Creatinine Clearance: 88.8 mL/min (by C-G formula based on SCr of 0.92 mg/dL). ? ? ?Medical History: ?Past Medical History:  ?Diagnosis Date  ? Anemia   ? Aortic aneurysm and dissection ([facility])   ? Aortic valvular disease   ? Arrhythmia   ? Atrial fibrillation ([facility])   ? Chronic anticoagulation   ? Coronary atherosclerosis   ? Dissecting aortic aneurysm, thoracoabdominal ([facility])   ? Fatty liver   ? GERD (gastroesophageal reflux disease)   ? Heart murmur   ? Hyperlipidemia   ? Hypertension   ? Hypolipidemia   ? Iron deficiency   ? Pulmonary nodule   ? Sleep apnea   ? Stroke [facility])   ? ? ?Medications:  ? ? ?Assessment: ?Pharmacy consulted to dose warfarin in this 56 year old male admitted with mechanical heart valve, Afib.    ?Pt was on warfarin 5 mg PO on Mon, Wed and Sat and warfarin 2.5 mg PO Tue, Thur, Fri and Sun. ?Last dose on 3/14 @ 0200.  ? ?3/14:  INR @ 2036 = 2.3  ? ?Goal of Therapy:  ?INR 2.5 - 3.5  ?Monitor platelets by anticoagulation protocol: Yes ?  ?Plan:  ?Will order warfarin 1.25 mg PO STAT on 3/14 @ 2300 to bring INR into therapeutic range. ?Will continue pt home warfarin dosing for now but may need to adjust based on INR results.  ?Will recheck INR on 3/15 with AM labs.  ? ?Chanan Detwiler D ?08/26/2021,10:30 PM ? ? ?

## 2021-08-26 NOTE — ED Provider Notes (Signed)
? ?Ryan [name] ?Provider Note ? ? ? Event Date/Time  ? First MD Initiated Contact with Patient 08/26/21 2020   ?  (approximate) ? ?History  ? ?Chief Complaint: Numbness ? ?HPI ? ?Ryan [name] is a 56 y.o. male with a past medical history of a TIA/CVA without long-term deficit per patient, hypertension, hyperlipidemia, presents to the emergency department for cute onset of right arm numbness.  According to the patient 45 minutes prior to arrival he developed acute onset of right arm numbness.  States he was working at [employer] trying to grab the fry basket but could not do so due to numbness and some weakness in the right upper extremity.  Patient came immediately to the emergency department and a code stroke was activated upon my evaluation.  Patient denies any headache.  Denies any slurred speech or confusion.  Denies any leg symptoms. ? ?Physical Exam  ? ?Triage Vital Signs: ?ED Triage Vitals  ?Enc Vitals Group  ?   BP 08/26/21 2021 114/71  ?   Pulse Rate 08/26/21 2021 72  ?   Resp 08/26/21 2021 19  ?   Temp 08/26/21 2021 97.9 ?F (36.6 ?C)  ?   Temp Source 08/26/21 2021 Oral  ?   SpO2 08/26/21 2021 98 %  ?   Weight --   ?   Height --   ?   Head Circumference --   ?   Peak Flow --   ?   Pain Score 08/26/21 2020 0  ?   Pain Loc --   ?   Pain Edu? --   ?   Excl. in [place]? --   ? ? ?Most recent vital signs: ?Vitals:  ? 08/26/21 2130 08/26/21 2145  ?BP: 117/65 116/85  ?Pulse: (!) 57 62  ?Resp: (!) 24 20  ?Temp:    ?SpO2: 95% 97%  ? ? ?General: Awake, no distress.  ?CV:  Good peripheral perfusion.  Regular rate and rhythm  ?Resp:  Normal effort.  Equal breath sounds bilaterally.  ?Abd:  No distention.  Soft, nontender.  No rebound or guarding. ?Other:  On examination patient has equal grip strength 5/5 strength in all extremities.  No cranial nerve deficits.  Sensation is intact and equal bilateral upper and lower extremities on my examination.  Continues to feel a sensation of heaviness and weakness in  that right arm. ? ? ?ED Results / Procedures / Treatments  ? ?EKG ? ?EKG viewed and interpreted by myself shows a normal sinus rhythm at 75 bpm with a widened QRS, normal axis, normal intervals, nonspecific but no concerning ST changes. ? ?RADIOLOGY ? ?I personally reviewed the CT images, no acute abnormality seen on my evaluation.  [place] no large bleed. ?Radiology is read the CT scan is negative. ? ? ?MEDICATIONS ORDERED IN ED: ?Medications - No data to display ? ? ?IMPRESSION / MDM / ASSESSMENT AND PLAN / ED COURSE  ?I reviewed the triage vital signs and the nursing notes. ? ?Patient presents to the emergency department for right arm weakness/numbness starting 45 minutes prior to arrival.  Overall the patient appears well, no distress.  Good neurological exam on my evaluation however given the onset of symptoms he is within the window and still complaining of a heaviness and numbness sensation I have activated a code stroke.  Neurology is currently evaluating via telemetry neurology. ? ?Patient's lab work is overall reassuring.  Chemistry is normal.  CBC is normal.  Alcohol negative.  Please see neurology note for NIH stroke scale. ? ?I spoken to neurology, do not wish to give TNK given improving exam.  They recommend admission to the [name] for further work-up and evaluation for TIA/CVA work-up. ? ?CRITICAL CARE ?Performed by: Harvest Dark ? ? ?Total critical care time: 30 minutes ? ?Critical care time was exclusive of separately billable procedures and treating other patients. ? ?Critical care was necessary to treat or prevent imminent or life-threatening deterioration. ? ?Critical care was time spent personally by me on the following activities: development of treatment plan with patient and/or surrogate as well as nursing, discussions with consultants, evaluation of patient's response to treatment, examination of patient, obtaining history from patient or surrogate, ordering and performing treatments  and interventions, ordering and review of laboratory studies, ordering and review of radiographic studies, pulse oximetry and re-evaluation of patient's condition. ? ? ?FINAL CLINICAL IMPRESSION(S) / ED DIAGNOSES  ? ?TIA  ? ? ?Note:  This document was prepared using Dragon voice recognition software and may include unintentional dictation errors. ?  Harvest Dark, MD ?08/26/21 2210 ? ?

## 2021-08-26 NOTE — ED Notes (Signed)
Pt to CT scan at this time.

## 2021-08-26 NOTE — ED Notes (Signed)
Neurologist called to speak with MD and plan to admit for observation of TIA symptoms. Pt and family updated with plan. Pt also encouraged to use urinal to provide urine specimen.  ?

## 2021-08-26 NOTE — H&P (Signed)
?History and Physical  ? ? ?Ryan [name] TKW:[id number] DOB: 24-Jan-1966 DOA: 08/26/2021 ? ?PCP: Pcp, No  ? ?Patient coming from: Home  ? ?Chief Complaint: Right arm numbness  ? ?HPI: Ryan [name] is a pleasant 56 y.o. 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  Patient reports that he was in his usual state and having an uneventful day at work when he noticed right arm numbness at approximately 7:15 PM.  He was using his right arm to prepare food when symptoms developed.  There was no associated change in vision or hearing, no weakness, no speech difficulty. He denies palpitations, chest discomfort, cough, SOB, or recent fever or chills.  ? ?ED Course: Upon arrival to the ED, patient is found to be afebrile and saturating well on room air with stable BP. EKG features SR with chronic LBBB. No acute findings on head CT. INR is 2.3 and potassium 3.4. Neurology assessed patient in ED, no tPA was given due to resolution of symptoms and elevated INR, and admission for TIA workup was recommended.  ? ?Review of Systems:  ?All other systems reviewed and apart from HPI, are negative. ? ?Past Medical History:  ?Diagnosis Date  ? Anemia   ? Aortic aneurysm and dissection (HCC)   ? Aortic valvular disease   ? Arrhythmia   ? Atrial fibrillation (HCC)   ? Chronic anticoagulation   ? Coronary atherosclerosis   ? Dissecting aortic aneurysm, thoracoabdominal (HCC)   ? Fatty liver   ? GERD (gastroesophageal reflux disease)   ? Heart murmur   ? Hyperlipidemia   ? Hypertension   ? Hypolipidemia   ? Iron deficiency   ? Pulmonary nodule   ? Sleep apnea   ? Stroke [facility])   ? ? ?Past Surgical History:  ?Procedure Laterality Date  ? ABDOMINAL AORTIC ANEURYSM REPAIR    ? AORTIC VALVE REPLACEMENT  2001  ? [employer] Mechanical  ?  ARTHROSCOPIC REPAIR ACL    ? COLONOSCOPY  2019  ? MENISCUS REPAIR    ? ? ?Social History:  ? reports that he has never smoked. He has been exposed to tobacco smoke. He has never used smokeless tobacco. He reports current alcohol use. He reports that he does not use drugs. ? ?Allergies  ?Allergen Reactions  ? Alitraq Rash  ? Iodinated Contrast Media Rash  ? Iodine Rash  ?  CT dye ?CT dye ? ?CT dye ?CT dye  ? ? ?Family History  ?Problem Relation Age of Onset  ? Heart attack Mother   ? Hypertension Mother   ? Hyperlipidemia Mother   ? Heart disease Mother   ? Heart attack Maternal Grandfather   ? ? ? ?Prior to Admission medications   ?Medication Sig Start Date End Date Taking? Authorizing Provider  ?aspirin 81 MG EC tablet Take 81 mg by mouth daily.   Yes [provider]  ?Ferrous Sulfate Dried (HIGH POTENCY IRON) 65 MG TABS Take 650 mg by mouth daily. 08/13/21  Yes [provider]  ?lisinopril (ZESTRIL) 10 MG tablet Take 1 tablet (10 mg total) by mouth daily. 08/15/21 02/11/22 Yes End, Cristal Deer, MD  ?lovastatin (MEVACOR) 20 MG tablet Take 20 mg by mouth at bedtime.   Yes [provider]  ?pantoprazole (PROTONIX) 40 MG tablet Take 40 mg by mouth daily. 02/12/20  Yes [provider]  ?Potassium 99 MG TABS Take 99 mg by mouth every evening.   Yes [provider]  ?warfarin (COUMADIN) 5 MG tablet Take 2.5-5 mg by mouth daily at 4 PM. Take 5 mg Monday, Weds, Sat. 2.5 mg on all other days   Yes [provider]  ?acetaminophen (TYLENOL) 500 MG tablet Take 500 mg by mouth every 6 (six) hours as needed.    [provider]  ? ? ?Physical Exam: ?Vitals:  ? 08/26/21 2130 08/26/21 2145 08/26/21 2200 08/26/21 2215  ?BP: 117/65 116/85 115/62 106/70  ?Pulse: (!) 57 62 (!) 48 (!) 54  ?Resp: (!) 24 20 20 20   ?Temp:      ?TempSrc:      ?SpO2: 95% 97% 95% 99%  ? ? ?Constitutional: NAD, calm  ?Eyes: PERTLA, lids and conjunctivae normal ?ENMT: Mucous membranes are moist. Posterior  pharynx clear of any exudate or lesions.   ?Neck: supple, no masses  ?Respiratory:  no wheezing, no crackles. No accessory muscle use.  ?Cardiovascular: S1 & S2 heard, regular rate and rhythm. No extremity edema.   ?Abdomen: No distension, no tenderness, soft. Bowel sounds active.  ?Musculoskeletal: no clubbing / cyanosis. No joint deformity upper and lower extremities.   ?Skin: no significant rashes, lesions, ulcers. Warm, dry, well-perfused. ?Neurologic: CN 2-12 grossly intact. Sensation intact. Strength 5/5 in all 4 limbs. Alert and oriented.  ?Psychiatric: Pleasant. Cooperative.  ? ? ?Labs and Imaging on Admission: I have personally reviewed following labs and imaging studies ? ?CBC: ?Recent Labs  ?Lab 08/26/21 ?2036  ?WBC 4.7  ?NEUTROABS 3.0  ?HGB 12.3*  ?HCT 36.7*  ?MCV 92.0  ?PLT 176  ? ?Basic Metabolic Panel: ?Recent Labs  ?Lab 08/26/21 ?2036  ?NA 133*  ?K 3.4*  ?CL 101  ?CO2 27  ?GLUCOSE 106*  ?BUN 11  ?CREATININE 0.92  ?CALCIUM 8.2*  ? ?GFR: ?Estimated Creatinine Clearance: 88.8 mL/min (by C-G formula based on SCr of 0.92 mg/dL). ?Liver Function Tests: ?Recent Labs  ?Lab 08/26/21 ?2036  ?AST 27  ?ALT 17  ?ALKPHOS 50  ?BILITOT 0.7  ?PROT 6.6  ?ALBUMIN 3.5  ? ?No results for input(s): LIPASE, AMYLASE in the last 168 hours. ?No results for input(s): AMMONIA in the last 168 hours. ?Coagulation Profile: ?Recent Labs  ?Lab 08/20/21 ?1437 08/26/21 ?2036  ?INR 3.0 2.3*  ? ?Cardiac Enzymes: ?No results for input(s): CKTOTAL, CKMB, CKMBINDEX, TROPONINI in the last 168 hours. ?BNP (last 3 results) ?No results for input(s): PROBNP in the last 8760 hours. ?HbA1C: ?No results for input(s): HGBA1C in the last 72 hours. ?CBG: ?Recent Labs  ?Lab 08/26/21 ?2026  ?GLUCAP 112*  ? ?Lipid Profile: ?No results for input(s): CHOL, HDL, LDLCALC, TRIG, CHOLHDL, LDLDIRECT in the last 72 hours. ?Thyroid Function Tests: ?No results for input(s): TSH, T4TOTAL, FREET4, T3FREE, THYROIDAB in the last 72 hours. ?Anemia Panel: ?No results  for input(s): VITAMINB12, FOLATE, FERRITIN, TIBC, IRON, RETICCTPCT in the last 72 hours. ?Urine analysis: ?   ?Component Value Date/Time  ? COLORURINE YELLOW (A) 08/26/2021 2036  ? APPEARANCEUR CLEAR (A) 08/26/2021 2036  ? LABSPEC 1.019 08/26/2021 2036  ? PHURINE 5.0 08/26/2021 2036  ? GLUCOSEU NEGATIVE 08/26/2021 2036  ? HGBUR LARGE (A) 08/26/2021 2036  ? BILIRUBINUR NEGATIVE 08/26/2021 2036  ? KETONESUR NEGATIVE 08/26/2021 2036  ? PROTEINUR NEGATIVE 08/26/2021 2036  ? NITRITE NEGATIVE 08/26/2021 2036  ? LEUKOCYTESUR NEGATIVE 08/26/2021 2036  ? ?Sepsis Labs: ?@LABRCNTIP (procalcitonin:4,lacticidven:4) ?)No results found for this or any  previous visit (from the past 240 hour(s)).  ? ?Radiological Exams on Admission: ?CT HEAD CODE STROKE WO CONTRAST ? ?Result Date: 08/26/2021 ?CLINICAL DATA:  Code stroke.  Right arm numbness EXAM: CT HEAD WITHOUT CONTRAST TECHNIQUE: Contiguous axial images were obtained from the base of the skull through the vertex without intravenous contrast. RADIATION DOSE REDUCTION: This exam was performed according to the departmental dose-optimization program which includes automated exposure control, adjustment of the mA and/or kV according to patient size and/or use of iterative reconstruction technique. COMPARISON:  None. FINDINGS: Brain: No evidence of acute infarction, hemorrhage, cerebral edema, mass, mass effect, or midline shift. Ventricles and sulci are normal for age. Posterior fossa arachnoid cyst. No hydrocephalus or acute extra-axial fluid collection. Vascular: No hyperdense vessel or unexpected calcification. Skull: Normal. Negative for fracture or focal lesion. Sinuses/Orbits: Mucosal thickening in the imaged right maxillary sinus, right ethmoid air cells, and inferior right frontal sinus. Imaged orbits are unremarkable. Other: Fluid in the left mastoid air cells. ASPECTS [facility] Stroke Program Early CT Score) - Ganglionic level infarction (caudate, lentiform nuclei, internal  capsule, insula, M1-M3 cortex): 7 - Supraganglionic infarction (M4-M6 cortex): 3 Total score (0-10 with 10 being normal): 10 IMPRESSION: 1. No acute intracranial process. 2. ASPECTS is 10 Code stroke imaging result

## 2021-08-26 NOTE — ED Triage Notes (Signed)
Pt presents to ER c/o right arm numbness that started appx 45 minutes ago.  Pt denies numbness anywhere else.  Pt has hx of TIA and MI in past.  Pt A&O x4 at this time in NAD. No unilateral weakness noted, no headache, blurry vision or dizziness.  Pt states he takes coumadin at home and is compliant.  NIH 0 at this time.   ?

## 2021-08-26 NOTE — ED Notes (Signed)
Pt return from CT.

## 2021-08-26 NOTE — Progress Notes (Signed)
Code stroke activated at 2053. Dr Cheral Bay on screen at 2102 ?

## 2021-08-26 NOTE — ED Notes (Signed)
Called [facility] for code STROKE Mora Bellman) ?

## 2021-08-27 ENCOUNTER — Observation Stay: Payer: Medicaid Other

## 2021-08-27 ENCOUNTER — Observation Stay (HOSPITAL_BASED_OUTPATIENT_CLINIC_OR_DEPARTMENT_OTHER)
Admit: 2021-08-27 | Discharge: 2021-08-27 | Disposition: A | Discharge status: Home / Self Care | Payer: Medicaid Other | Attending: Family Medicine | Admitting: Family Medicine

## 2021-08-27 DIAGNOSIS — G459 Transient cerebral ischemic attack, unspecified: Secondary | ICD-10-CM

## 2021-08-27 LAB — LIPID PANEL
Cholesterol: 76 mg/dL (ref 0–200)
HDL: 22 mg/dL — ABNORMAL LOW (ref >40)
LDL Cholesterol: 34 mg/dL (ref 0–99)
Total CHOL/HDL Ratio: 3.5 RATIO
Triglycerides: 98 mg/dL (ref <150)
VLDL: 20 mg/dL (ref 0–40)

## 2021-08-27 LAB — ECHOCARDIOGRAM COMPLETE
AR max vel: 1.3 cm2
AV Area VTI: 1.41 cm2
AV Area mean vel: 1.35 cm2
AV Mean grad: 6.8 mmHg
AV Peak grad: 13.4 mmHg
Ao pk vel: 1.83 m/s
Area-P 1/2: 3.2 cm2
Height: 68 in
MV VTI: 1.45 cm2
S' Lateral: 3.4 cm
Weight: 2768 oz

## 2021-08-27 LAB — PROTIME-INR
INR: 2.6 — ABNORMAL HIGH (ref 0.8–1.2)
Prothrombin Time: 28 seconds — ABNORMAL HIGH (ref 11.4–15.2)

## 2021-08-27 LAB — TSH: TSH: 1.506 u[IU]/mL (ref 0.350–4.500)

## 2021-08-27 LAB — HIV ANTIBODY (ROUTINE TESTING W REFLEX): HIV Screen 4th Generation wRfx: NONREACTIVE

## 2021-08-27 LAB — RESP PANEL BY RT-PCR (FLU A&B, COVID) ARPGX2
Influenza A by PCR: NEGATIVE
Influenza B by PCR: NEGATIVE
SARS Coronavirus 2 by RT PCR: NEGATIVE

## 2021-08-27 IMAGING — MR MR MRA NECK W/O CM
1 of 2 series · 28 of 48 positions shown · non-contrast
Comparison: No prior MRI or MRA, correlation is made with CT head
[DATE]

CLINICAL DATA: Stroke code, right arm numbness

EXAM:
MRI HEAD WITHOUT CONTRAST
MRA HEAD WITHOUT CONTRAST
MRA NECK WITHOUT CONTRAST
TECHNIQUE: Multiplanar, multi-echo pulse sequences of the brain and surrounding
structures were acquired without intravenous contrast. Angiographic
images of the Circle of Willis were acquired using MRA technique
without intravenous contrast. Angiographic images of the neck were
acquired using MRA technique without intravenous contrast. Carotid
stenosis measurements (when applicable) are obtained utilizing
NASCET criteria, using the distal internal carotid diameter as the
denominator.

[Series 19: TOF · axial · 0.5mm · 0.54mm/px · z∈[-189,-18]mm · 28 of 388 slices shown]
[im 1/388]
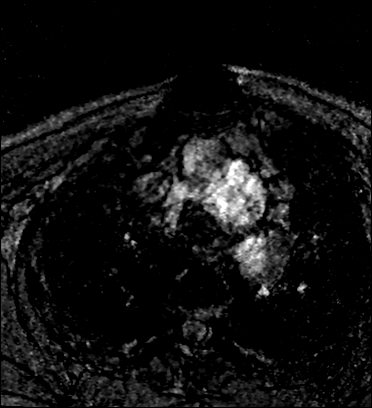
[im 12/388]
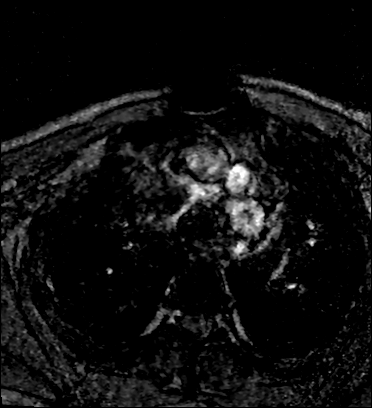
[im 23/388]
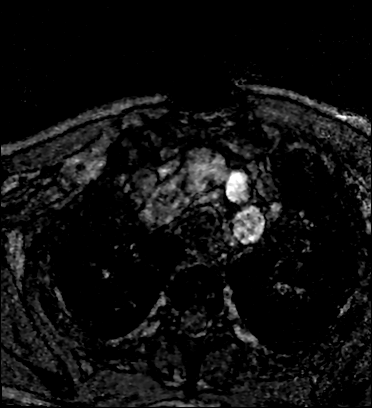
[im 34/388]
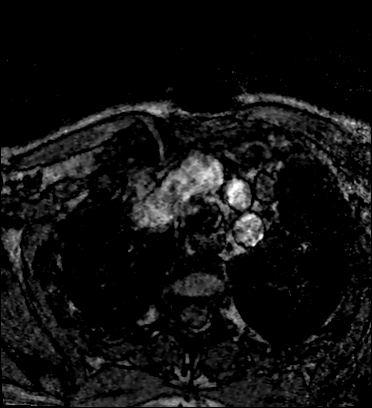
[im 45/388]
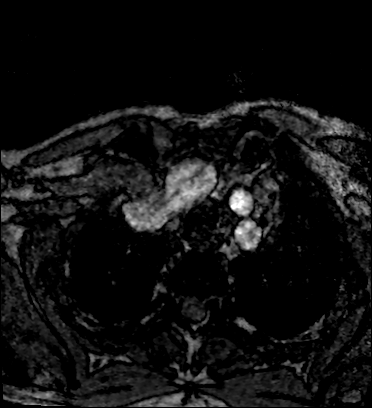
[im 56/388]
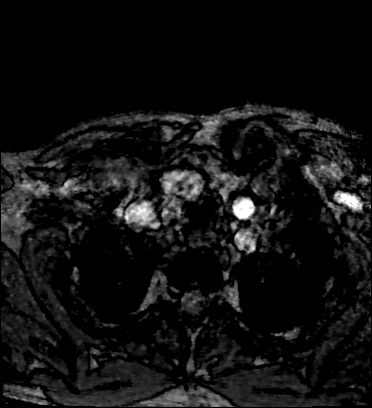
[im 67/388]
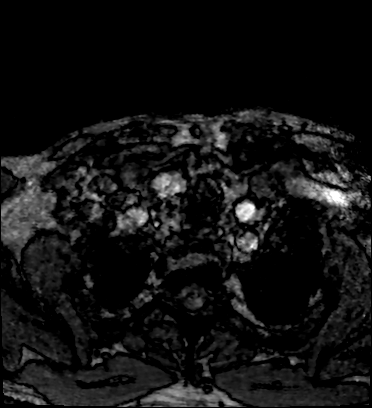
[im 78/388]
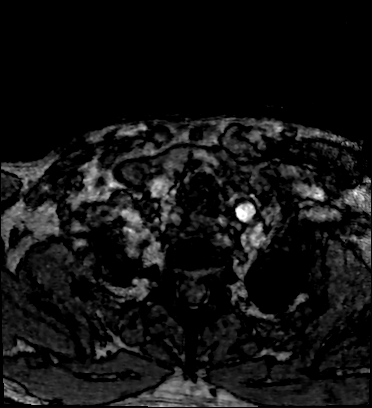
[im 89/388]
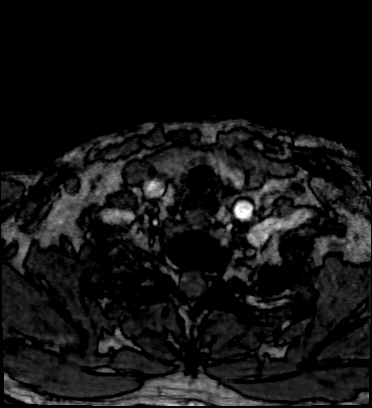
[im 100/388]
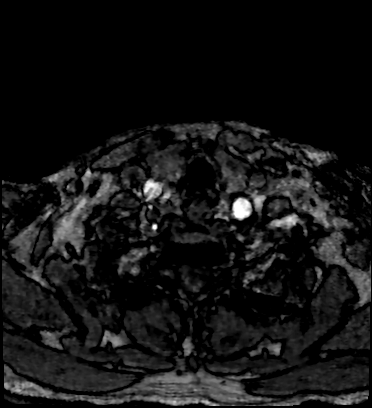
[im 111/388]
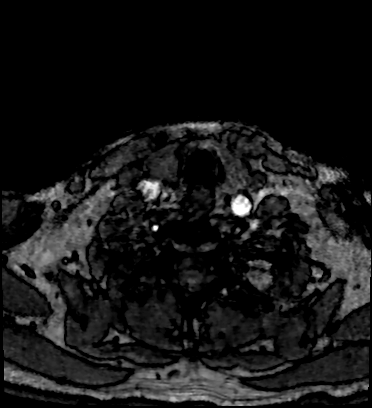
[im 122/388]
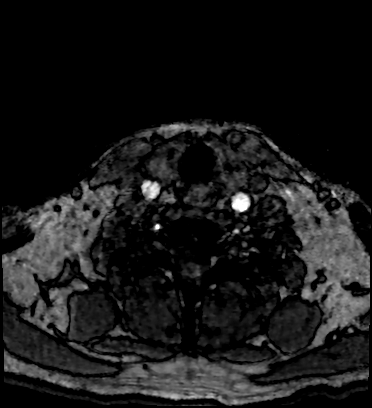
[im 133/388]
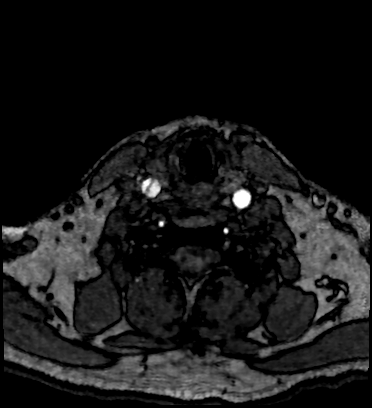
[im 144/388]
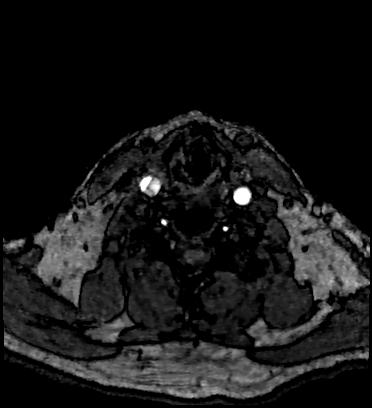
[im 155/388]
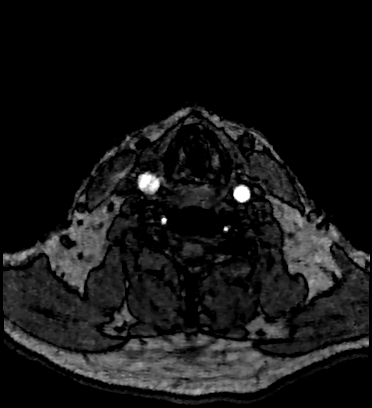
[im 166/388]
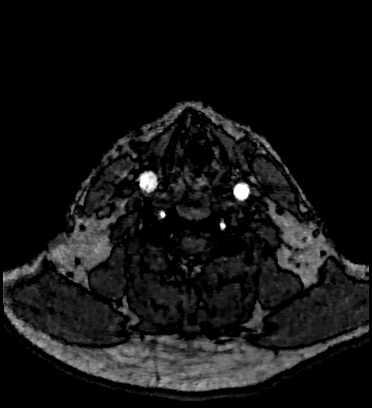
[im 177/388]
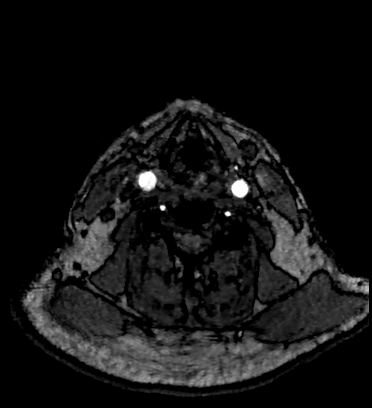
[im 188/388]
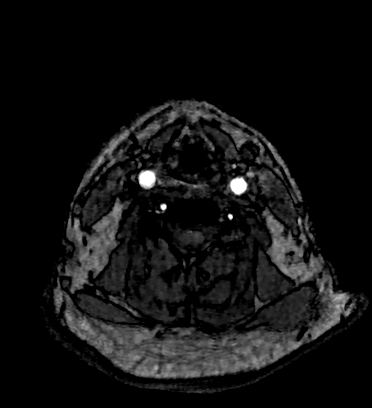
[im 200/388]
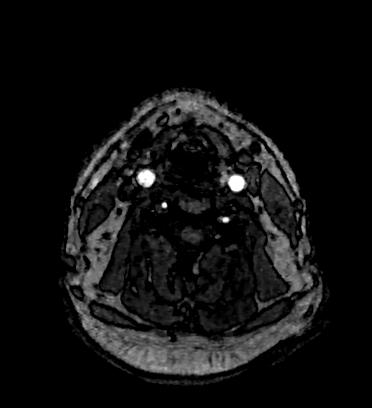
[im 211/388]
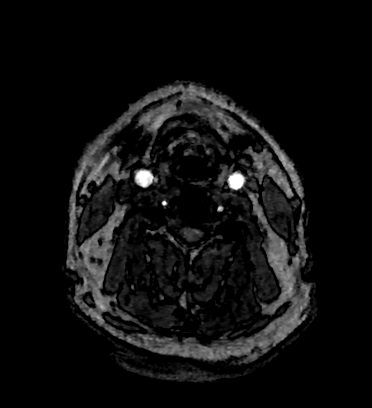
[im 222/388]
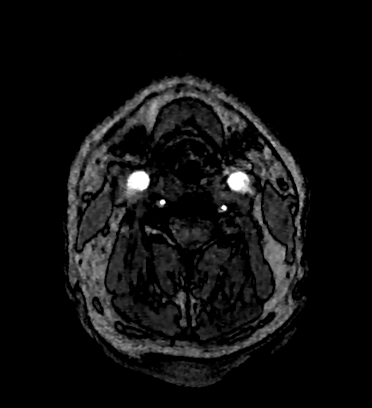
[im 233/388]
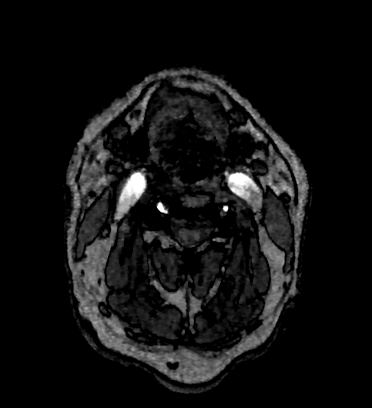
[im 244/388]
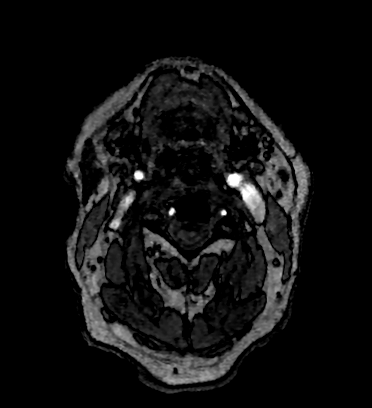
[im 255/388]
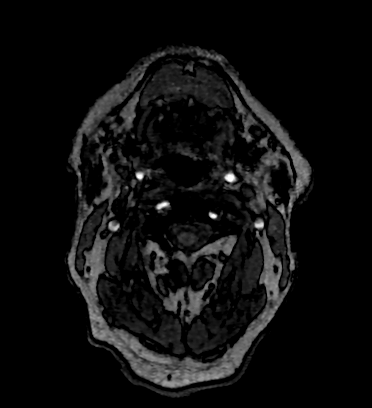
[im 266/388]
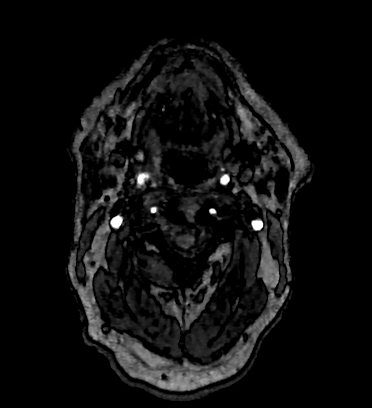
[im 321/388]
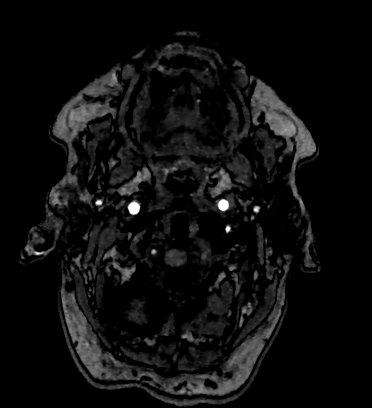
[im 332/388]
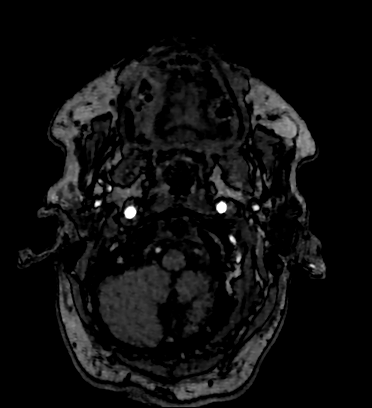
[im 365/388]
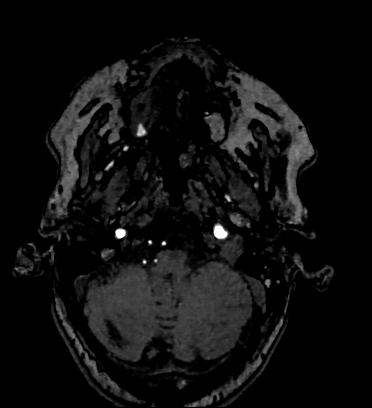

[28 of 48 positions shown; findings below may reference images not displayed]

FINDINGS: MRI HEAD FINDINGS

Brain: No restricted diffusion to suggest acute or subacute infarct.
No acute hemorrhage, mass, mass effect, or midline shift. Numerous
foci of hemosiderin deposition, primarily in the bilateral
cerebellar hemispheres, but also in the inferior cerebral
hemispheres, which are nonspecific but likely sequela of prior
microhemorrhages. Arachnoid cyst in the posterior fossa, superior to
the cerebellum. No hydrocephalus or acute extra-axial collection.
Multiple lacunar infarcts in the bilateral cerebellar hemispheres,
basal ganglia, and right thalamus. T2 hyperintense signal in the
periventricular white matter, likely the sequela of chronic small
vessel ischemic disease.

Vascular: Normal flow voids.

Skull and upper cervical spine: Normal marrow signal. Degenerative
changes in the cervical spine.

Sinuses/Orbits: Mucosal thickening in the right-greater-than-left
maxillary sinus and right-greater-than-left ethmoid air cells.
Status post left lens replacement.

Other: Fluid in left mastoid air cells.

MRA HEAD FINDINGS

Anterior circulation: Both internal carotid arteries are patent to
the termini, without significant stenosis.

A1 segments patent. Normal anterior communicating artery. Anterior
cerebral arteries are patent to their distal aspects.

No M1 stenosis or occlusion. Normal MCA bifurcations. Distal MCA
branches perfused and symmetric.

Posterior circulation: Vertebral arteries patent to the
vertebrobasilar junction without stenosis. Posterior inferior
cerebral arteries patent bilaterally.

Basilar patent to its distal aspect. Superior cerebellar arteries
patent bilaterally.

Fetal origin of the left PCA, from a patent left posterior
communicating artery with aplastic left P1. Near fetal origin of the
right PCA, with a patent right posterior communicating artery with a
diminutive right P1. PCAs perfused to their distal aspects without
stenosis. The bilateral posterior communicating arteries are not
visualized.

Anatomic variants: Fetal origin of the left PCA. Near fetal origin
of the right PCA.

MRA NECK FINDINGS

The aorta is poorly visualized; however there is suggestion of a
dissection extending from the aorta into the brachiocephalic and
right common carotid artery (series 19, image 135 and series [1X],
image 6), although this may be artifactual.

Common, internal, and external carotid arteries are otherwise
patent, without hemodynamically significant stenosis.

The origins of the vertebral arteries are poorly evaluated secondary
to artifact. The remainder of the extracranial vertebral arteries
are patent, without hemodynamically significant stenosis.

Other: None
IMPRESSION: 1. No acute intracranial process.
2. Multiple small foci of hemosiderin deposition, primarily in the
cerebellar hemispheres but also in the inferior cerebral
hemispheres. These are nonspecific and most likely the consequence
of hypertensive microhemorrhages, although they can also be seen in
the setting of cerebral amyloid angiopathy.
3. Suggestion of aortic dissection extending into the
brachiocephalic and right common carotid artery, although this may
be artifactual. If there is clinical concern for dissection,
consider CTA neck or CTA chest.
4. No hemodynamically significant stenosis in the neck.
5. No intracranial large vessel occlusion or significant stenosis.

These results were called by telephone at the time of interpretation
on [DATE] at [DATE] to provider TAM , who verbally
acknowledged these results.

## 2021-08-27 IMAGING — MR MR HEAD W/O CM
13 of 15 series · 39 of 48 positions shown · non-contrast
Comparison: No prior MRI or MRA, correlation is made with CT head
[DATE]

CLINICAL DATA: Stroke code, right arm numbness

EXAM:
MRI HEAD WITHOUT CONTRAST
MRA HEAD WITHOUT CONTRAST
MRA NECK WITHOUT CONTRAST
TECHNIQUE: Multiplanar, multi-echo pulse sequences of the brain and surrounding
structures were acquired without intravenous contrast. Angiographic
images of the Circle of Willis were acquired using MRA technique
without intravenous contrast. Angiographic images of the neck were
acquired using MRA technique without intravenous contrast. Carotid
stenosis measurements (when applicable) are obtained utilizing
NASCET criteria, using the distal internal carotid diameter as the
denominator.

[Series 5: ax dwi_tracew · axial · 3.0mm · 0.65mm/px · z∈[-37,+117]mm · 3 of 48 slices shown (1 of 2)]
[im 1/48]
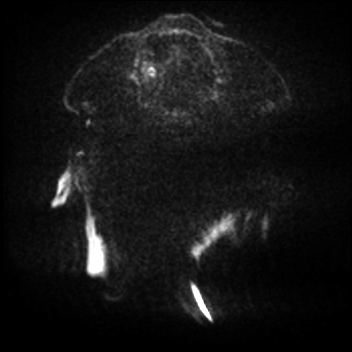
[im 24/48]
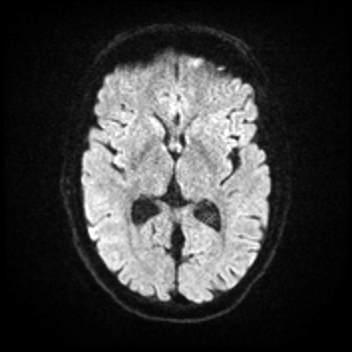
[im 48/48]
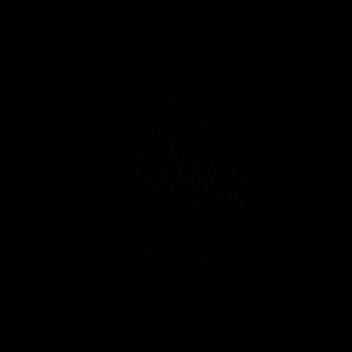

[Series 6: ax dwi_adc · axial · 3.0mm · 0.65mm/px · z∈[-37,+110]mm · 3 of 46 slices shown (1 of 2)]
[im 1/46]
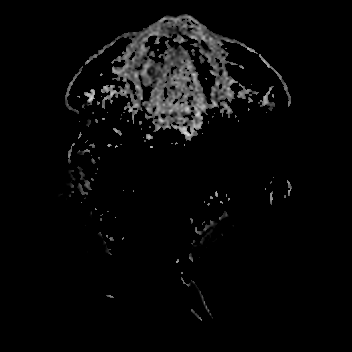
[im 23/46]
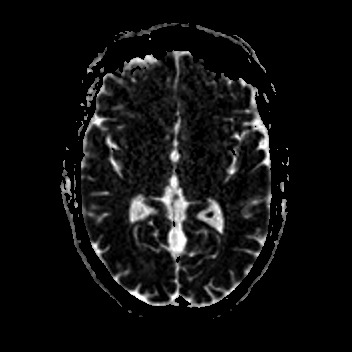
[im 46/46]
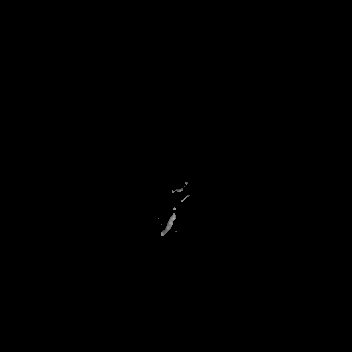

[Series 13: cor dwi_tracew · coronal · 5.0mm · 0.65mm/px · 2 of 40 slices shown]
[im 1/40]
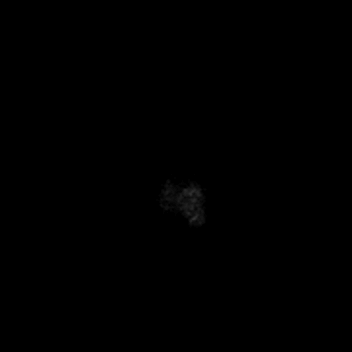
[im 40/40]
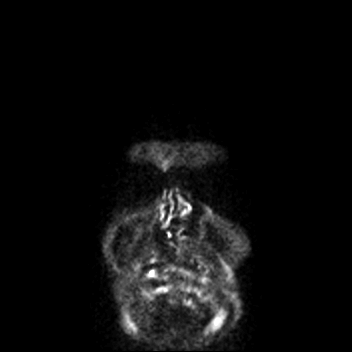

[Series 14: cor dwi_adc · coronal · 5.0mm · 0.65mm/px · 2 of 39 slices shown]
[im 1/39]
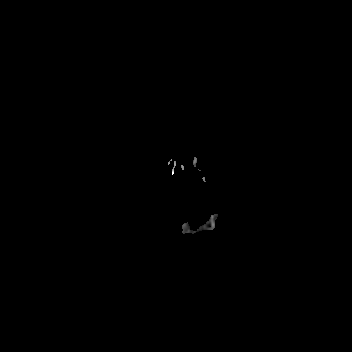
[im 39/39]
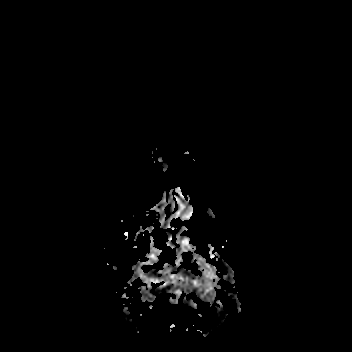

[Series 15: ax dwi_tracew · axial · 3.0mm · 0.65mm/px · z∈[-27,+125]mm · 3 of 48 slices shown (2 of 2)]
[im 1/48]
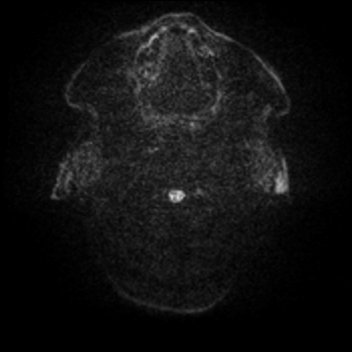
[im 24/48]
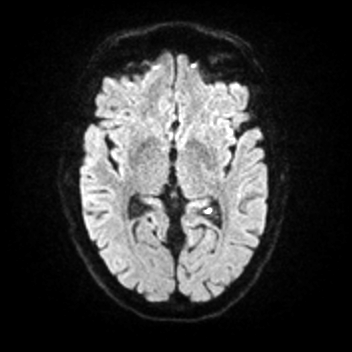
[im 48/48]
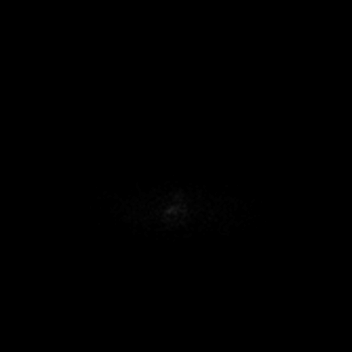

[Series 16: ax dwi_adc · axial · 3.0mm · 0.65mm/px · z∈[-27,+125]mm · 3 of 48 slices shown (2 of 2)]
[im 1/48]
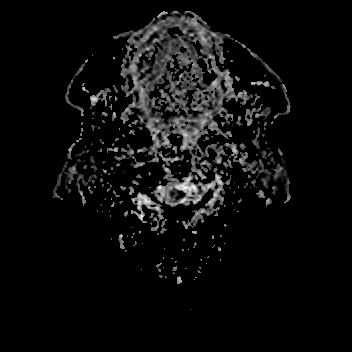
[im 24/48]
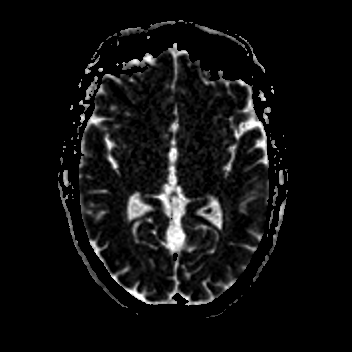
[im 48/48]
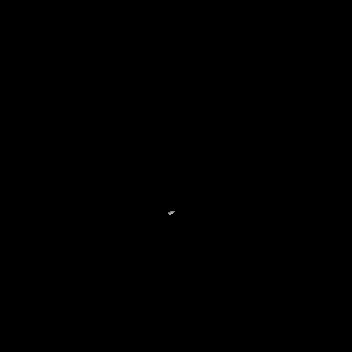

[Series 17: T1 · sagittal · 5.0mm · 0.62mm/px · 1 of 21 slices shown (1 of 2)]
[im 1/21]
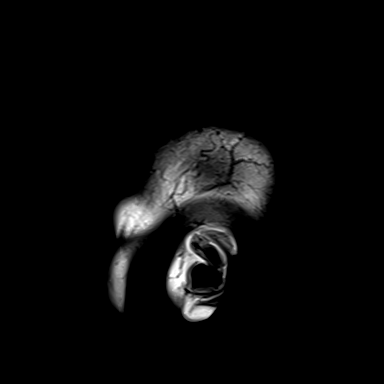

[Series 18: T2 · axial · 5.0mm · 0.53mm/px · z∈[-17,+124]mm · 2 of 25 slices shown (1 of 2)]
[im 1/25]
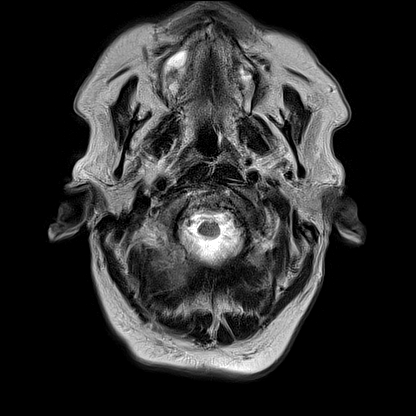
[im 25/25]
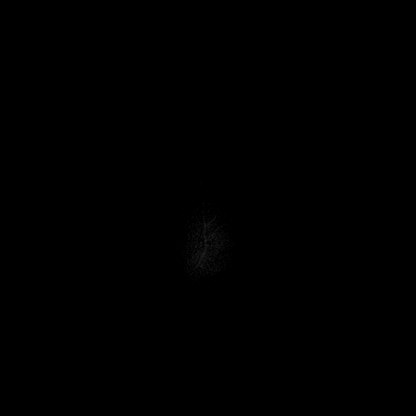

[Series 19: mag_images · axial · 3.0mm · 0.90mm/px · z∈[-33,+141]mm · 4 of 60 slices shown]
[im 1/60]
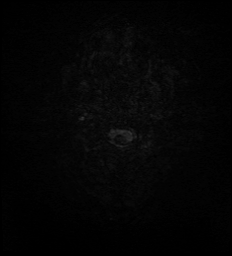
[im 20/60]
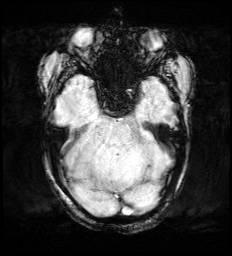
[im 40/60]
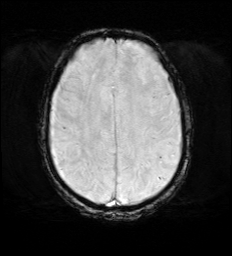
[im 60/60]
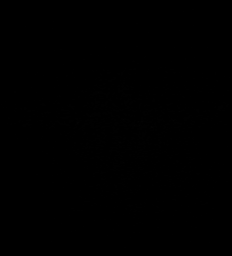

[Series 20: pha_images · axial · 3.0mm · 0.90mm/px · z∈[-33,+141]mm · 3 of 57 slices shown]
[im 1/57]
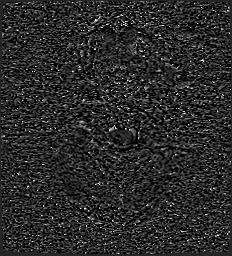
[im 29/57]
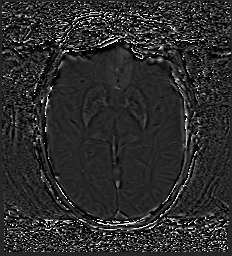
[im 57/57]
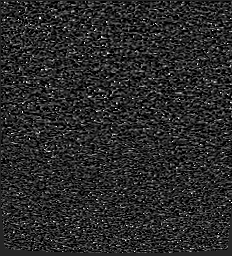

[Series 23: FLAIR · axial · 3.0mm · 0.53mm/px · z∈[-26,+133]mm · 3 of 55 slices shown]
[im 1/55]
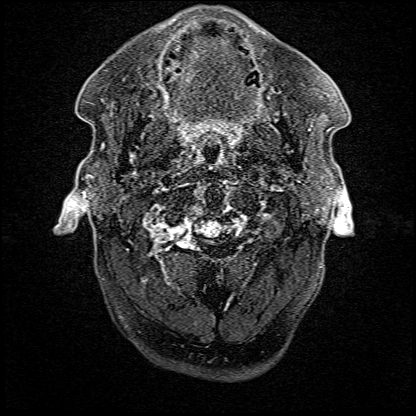
[im 28/55]
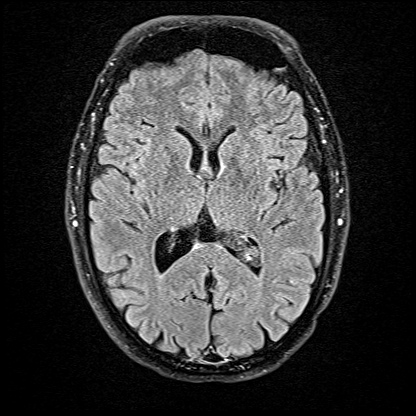
[im 55/55]
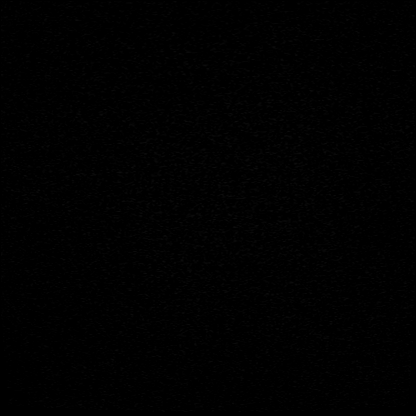

[Series 24: T1 · axial · 1.0mm · 0.98mm/px · z∈[-22,+134]mm · 8 of 160 slices shown (2 of 2)]
[im 1/160]
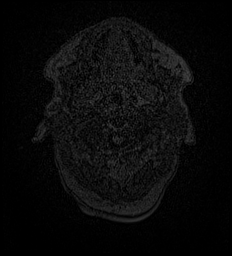
[im 18/160]
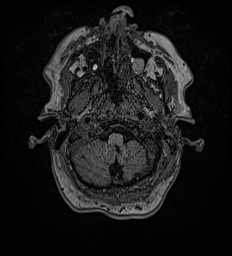
[im 54/160]
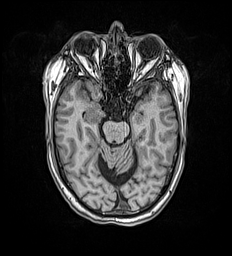
[im 71/160]
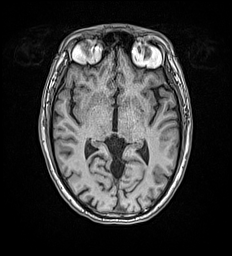
[im 89/160]
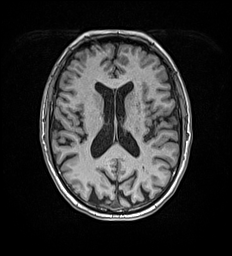
[im 107/160]
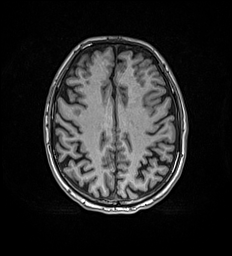
[im 142/160]
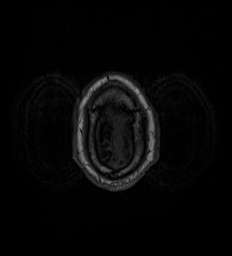
[im 160/160]
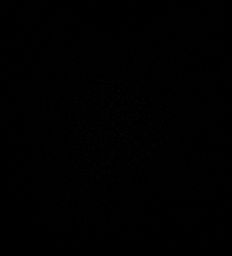

[Series 25: T2 · coronal · 5.0mm · 0.57mm/px · 2 of 29 slices shown (2 of 2)]
[im 1/29]
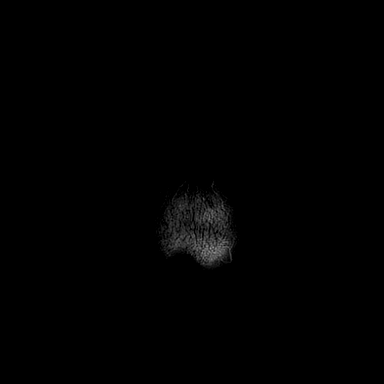
[im 29/29]
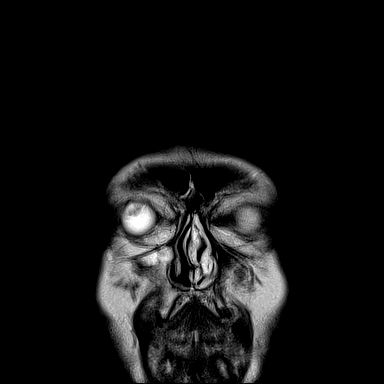

[39 of 48 positions shown; findings below may reference images not displayed]

FINDINGS: MRI HEAD FINDINGS

Brain: No restricted diffusion to suggest acute or subacute infarct.
No acute hemorrhage, mass, mass effect, or midline shift. Numerous
foci of hemosiderin deposition, primarily in the bilateral
cerebellar hemispheres, but also in the inferior cerebral
hemispheres, which are nonspecific but likely sequela of prior
microhemorrhages. Arachnoid cyst in the posterior fossa, superior to
the cerebellum. No hydrocephalus or acute extra-axial collection.
Multiple lacunar infarcts in the bilateral cerebellar hemispheres,
basal ganglia, and right thalamus. T2 hyperintense signal in the
periventricular white matter, likely the sequela of chronic small
vessel ischemic disease.

Vascular: Normal flow voids.

Skull and upper cervical spine: Normal marrow signal. Degenerative
changes in the cervical spine.

Sinuses/Orbits: Mucosal thickening in the right-greater-than-left
maxillary sinus and right-greater-than-left ethmoid air cells.
Status post left lens replacement.

Other: Fluid in left mastoid air cells.

MRA HEAD FINDINGS

Anterior circulation: Both internal carotid arteries are patent to
the termini, without significant stenosis.

A1 segments patent. Normal anterior communicating artery. Anterior
cerebral arteries are patent to their distal aspects.

No M1 stenosis or occlusion. Normal MCA bifurcations. Distal MCA
branches perfused and symmetric.

Posterior circulation: Vertebral arteries patent to the
vertebrobasilar junction without stenosis. Posterior inferior
cerebral arteries patent bilaterally.

Basilar patent to its distal aspect. Superior cerebellar arteries
patent bilaterally.

Fetal origin of the left PCA, from a patent left posterior
communicating artery with aplastic left P1. Near fetal origin of the
right PCA, with a patent right posterior communicating artery with a
diminutive right P1. PCAs perfused to their distal aspects without
stenosis. The bilateral posterior communicating arteries are not
visualized.

Anatomic variants: Fetal origin of the left PCA. Near fetal origin
of the right PCA.

MRA NECK FINDINGS

The aorta is poorly visualized; however there is suggestion of a
dissection extending from the aorta into the brachiocephalic and
right common carotid artery (series 19, image 135 and series [1X],
image 6), although this may be artifactual.

Common, internal, and external carotid arteries are otherwise
patent, without hemodynamically significant stenosis.

The origins of the vertebral arteries are poorly evaluated secondary
to artifact. The remainder of the extracranial vertebral arteries
are patent, without hemodynamically significant stenosis.

Other: None
IMPRESSION: 1. No acute intracranial process.
2. Multiple small foci of hemosiderin deposition, primarily in the
cerebellar hemispheres but also in the inferior cerebral
hemispheres. These are nonspecific and most likely the consequence
of hypertensive microhemorrhages, although they can also be seen in
the setting of cerebral amyloid angiopathy.
3. Suggestion of aortic dissection extending into the
brachiocephalic and right common carotid artery, although this may
be artifactual. If there is clinical concern for dissection,
consider CTA neck or CTA chest.
4. No hemodynamically significant stenosis in the neck.
5. No intracranial large vessel occlusion or significant stenosis.

These results were called by telephone at the time of interpretation
on [DATE] at [DATE] to provider TAM , who verbally
acknowledged these results.

## 2021-08-27 MED ORDER — POTASSIUM CHLORIDE CRYS ER 20 MEQ PO TBCR
40.0000 meq | EXTENDED_RELEASE_TABLET | Freq: Once | ORAL | Status: AC
  Administered 2021-08-27: 40 meq via ORAL
  Filled 2021-08-27: qty 2

## 2021-08-27 NOTE — Evaluation (Signed)
Occupational Therapy Evaluation ?Patient Details ?Name: Ryan [name] ?MRN: [id number] ?DOB: [birth year] ?Today's Date: 08/27/2021 ? ? ?History of Present Illness Ryan [name] is a pleasant 56 y.o. 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 Upon arrival to the ED, patient is found to be afebrile and saturating well on room air with stable BP. EKG features SR with chronic LBBB. No acute findings on head CT. INR is 2.3 and potassium 3.4. Neurology assessed patient in ED, no tPA was given due to resolution of symptoms and elevated INR, and admission for TIA workup was recommended. Immaging negative for acute infarct.  ? ?Clinical Impression ?  ?Ryan [name] was seen for OT evaluation this date. Prior to hospital admission, pt was independent in all aspects of ADL/IADL, working 4 days/week, and denies falls history in past 12 months. Pt lives alone in a 1 story home with 3 steps to enter. Currently pt reporting symptoms have resolved. Pt demonstrates baseline independence to perform ADL and mobility tasks and no strength, sensory, coordination, cognitive, or visual deficits appreciated with assessment. No skilled OT needs identified. Will sign off. Please re-consult if additional OT needs arise during this hospital stay.  ?  ?   ? ?Recommendations for follow up therapy are one component of a multi-disciplinary discharge planning process, led by the attending physician.  Recommendations may be updated based on patient status, additional functional criteria and insurance authorization.  ? ?Follow Up Recommendations ? No OT follow up  ?  ?Assistance Recommended at Discharge PRN  ?Patient can return home with the following   ? ?  ?Functional Status Assessment ? Patient has not had a recent decline in their functional status   ?Equipment Recommendations ? None recommended by OT  ?  ?Recommendations for Other Services   ? ? ?  ?Precautions / Restrictions Precautions ?Precautions: Fall ?Precaution Comments: Low fall ?Restrictions ?Weight Bearing Restrictions: No  ? ?  ? ?Mobility Bed Mobility ?Overal bed mobility: Independent ?  ?  ?  ?  ?  ?  ?  ?  ? ?Transfers ?Overall transfer level: Independent ?  ?  ?  ?  ?  ?  ?  ?  ?  ?  ? ?  ?Balance Overall balance assessment: No apparent balance deficits (not formally assessed) ?  ?  ?  ?  ?  ?  ?  ?  ?  ?  ?  ?  ?  ?  ?  ?  ?  ?  ?   ? ?ADL either performed or assessed with clinical judgement  ? ?ADL Overall ADL's : At baseline ?  ?  ?  ?  ?  ?  ?  ?  ?  ?  ?  ?  ?  ?  ?  ?  ?  ?  ?  ?General ADL Comments: Pt presents at baseline level of functional independence for ADL management. He is up ad lib in room, denies any strength, sensory, or visual deficits at this time. Endorses all symptoms have resolved.  ? ? ? ?Vision Patient Visual Report: No change from baseline ?   ?   ?Perception   ?  ?Praxis   ?  ? ?Pertinent Vitals/Pain Pain Assessment ?Pain Assessment: No/denies pain  ? ? ? ?Hand Dominance Right ?  ?Extremity/Trunk  Assessment Upper Extremity Assessment ?Upper Extremity Assessment: Overall WFL for tasks assessed ?  ?Lower Extremity Assessment ?Lower Extremity Assessment: Overall WFL for tasks assessed ?  ?Cervical / Trunk Assessment ?Cervical / Trunk Assessment: Normal ?  ?Communication Communication ?Communication: No difficulties ?  ?Cognition Arousal/Alertness: Awake/alert ?Behavior During Therapy: [facility] for tasks assessed/performed ?Overall Cognitive Status: Within Functional Limits for tasks assessed ?  ?  ?  ?  ?  ?  ?  ?  ?  ?  ?  ?  ?  ?  ?  ?  ?General Comments: Pleasant, conversational, A&Ox4 ?  ?  ?General Comments    ? ?  ?Exercises   ?  ?Shoulder Instructions    ? ? ?Home Living Family/patient expects to be discharged to:: Private residence ?Living Arrangements:  Alone ?Available Help at Discharge: Friend(s);Available PRN/intermittently ?Type of Home: House ?Home Access: Stairs to enter ?Entrance Stairs-Number of Steps: 3 ?Entrance Stairs-Rails: None ?Home Layout: One level ?  ?  ?  ?  ?  ?  ?  ?Home Equipment: None ?  ?  ?  ? ?  ?Prior Functioning/Environment Prior Level of Function : Independent/Modified Independent ?  ?  ?  ?  ?  ?  ?  ?ADLs Comments: Independent, working 4 days/week at [employer]. ?  ? ?  ?  ?OT Problem List: Impaired sensation;Impaired vision/perception;Decreased knowledge of use of DME or AE ?  ?   ?OT Treatment/Interventions:    ?  ?OT Goals(Current goals can be found in the care plan section) Acute Rehab OT Goals ?Patient Stated Goal: to get back to work ?OT Goal Formulation: All assessment and education complete, DC therapy ?Time For Goal Achievement: 08/27/21 ?Potential to Achieve Goals: Good  ?OT Frequency:   ?  ? ?Co-evaluation   ?  ?  ?  ?  ? ?  ?AM-PAC OT "6 Clicks" Daily Activity     ?Outcome Measure Help from another person eating meals?: None ?Help from another person taking care of personal grooming?: None ?Help from another person toileting, which includes using toliet, bedpan, or urinal?: None ?Help from another person bathing (including washing, rinsing, drying)?: None ?Help from another person to put on and taking off regular upper body clothing?: None ?Help from another person to put on and taking off regular lower body clothing?: None ?6 Click Score: 24 ?  ?End of Session   ? ?Activity Tolerance: Patient tolerated treatment well ?Patient left: in bed ? ?OT Visit Diagnosis: Other symptoms and signs involving the nervous system (R29.898)  ?              ?Time: [id number] ?OT Time Calculation (min): 13 min ?Charges:  OT General Charges ?$OT Visit: 1 Visit ?OT Evaluation ?$OT Eval Low Complexity: 1 Low ? ?Shara Blazing, M.S., OTR/L ?[facility] - [facility] ?Ascom: [phone] ?08/27/21, 10:05 AM ? ?

## 2021-08-27 NOTE — Discharge Summary (Signed)
? ?Physician Discharge Summary ? ? ?Ryan [name]  male DOB: 1965/07/30  ?YIR:[id number] ? ?PCP: Pcp, No ? ?Admit date: 08/26/2021 ?Discharge date: 08/27/2021 ? ?Admitted From: home ?Disposition:  home ?CODE STATUS: Full code ? ? ?Hospital Course:  ?For full details, please see H&P, progress notes, consult notes and ancillary notes.  ?Briefly,  ?Ryan [name] is a pleasant 56 y.o. 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  Pt was seen by teleNeuro on presentation.   ? ?TIA  ?- Presents with transient RUE numbness, now resolved.  Pt reported multiple past TIA events.   ?- No acute findings on head CT.  MRI brain neg for stroke. ?--cont home ASA 81 and statin ?  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 ?--cont warfarin for the mech aortic valve ? ?PAF ?- Continue warfarin  ? ?Hypokalemia ?--repleted. ?--cont home potassium supplement ? ? ?Discharge Diagnoses:  ?Principal Problem: ?  TIA (transient ischemic attack) ?Active Problems: ?  History of heart valve replacement with mechanical valve ?  Paroxysmal atrial fibrillation (HCC) ? ? ? ? ?Discharge Instructions: ? ?Allergies as of 08/27/2021   ? ?   Reactions  ? Alitraq Rash  ? Iodinated Contrast Media Rash  ? Iodine Rash  ? CT dye ?CT dye ?CT dye ?CT dye  ? ?  ? ?  ?Medication List  ?  ? ?TAKE these medications   ? ?acetaminophen 500 MG tablet ?Commonly known as: TYLENOL ?Take  500 mg by mouth every 6 (six) hours as needed. ?  ?aspirin 81 MG EC tablet ?Take 81 mg by mouth daily. ?  ?High Potency Iron 65 MG Tabs ?Take 650 mg by mouth daily. ?  ?lisinopril 10 MG tablet ?Commonly known as: ZESTRIL ?Take 1 tablet (10 mg total) by mouth daily. ?  ?lovastatin 20 MG tablet ?Commonly known as: MEVACOR ?Take 20 mg by mouth at bedtime. ?  ?pantoprazole 40 MG tablet ?Commonly known as: PROTONIX ?Take 40 mg by mouth daily. ?  ?Potassium 99 MG Tabs ?Take 99 mg by mouth every evening. ?  ?warfarin 5 MG tablet ?Commonly known as: COUMADIN ?Take as directed. If you are unsure how to take this medication, talk to your nurse or doctor. ?Original instructions: Take 2.5-5 mg by mouth daily at 4 PM. Take 5 mg Monday, Weds, Sat. 2.5 mg on all other days ?  ? ?  ? ? ? ? ?Allergies  ?Allergen Reactions  ? Alitraq Rash  ? Iodinated Contrast Media Rash  ? Iodine Rash  ?  CT dye ?CT dye ? ?CT dye ?CT dye  ? ? ? ?The results of significant diagnostics from this hospitalization (including imaging, microbiology, ancillary and laboratory) are listed below for reference.  ? ?Consultations: ? ? ?Procedures/Studies: ?MR ANGIO HEAD WO CONTRAST ? ?Result Date: 08/27/2021 ?CLINICAL DATA:  Stroke code, right arm numbness EXAM: MRI HEAD WITHOUT CONTRAST MRA HEAD WITHOUT CONTRAST MRA NECK WITHOUT CONTRAST TECHNIQUE: Multiplanar, multi-echo pulse sequences of the  brain and surrounding structures were acquired without intravenous contrast. Angiographic images of the Circle of Willis were acquired using MRA technique without intravenous contrast. Angiographic images of the neck were acquired using MRA technique without intravenous contrast. Carotid stenosis measurements (when applicable) are obtained utilizing NASCET criteria, using the distal internal carotid diameter as the denominator. COMPARISON:  No prior MRI or MRA, correlation is made with CT head 08/26/2021 FINDINGS: MRI HEAD FINDINGS Brain: No restricted diffusion to  suggest acute or subacute infarct. No acute hemorrhage, mass, mass effect, or midline shift. Numerous foci of hemosiderin deposition, primarily in the bilateral cerebellar hemispheres, but also in the inferior cerebral hemispheres, which are nonspecific but likely sequela of prior microhemorrhages. 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 No hydrocephalus or acute extra-axial collection. Multiple lacunar infarcts in the bilateral cerebellar hemispheres, basal ganglia, and right thalamus. T2 hyperintense signal in the periventricular white matter, likely the sequela of chronic small vessel ischemic disease. Vascular: Normal flow voids. Skull and upper cervical spine: Normal marrow signal. Degenerative changes in the cervical spine. Sinuses/Orbits: Mucosal thickening in the right-greater-than-left maxillary sinus and right-greater-than-left ethmoid air cells. Status post left lens replacement. Other: Fluid in left mastoid air cells. MRA HEAD FINDINGS Anterior circulation: Both internal carotid arteries are patent to the termini, without significant stenosis. A1 segments patent. Normal anterior communicating artery. Anterior cerebral arteries are patent to their distal aspects. No M1 stenosis or occlusion. Normal MCA bifurcations. Distal MCA branches perfused and symmetric. Posterior circulation: Vertebral arteries patent to the vertebrobasilar junction without stenosis. Posterior inferior cerebral arteries patent bilaterally. Basilar patent to its distal aspect. Superior cerebellar arteries patent bilaterally. Fetal origin of the left PCA, from a patent left posterior communicating artery with aplastic left P1. Near fetal origin of the right PCA, with a patent right posterior communicating artery with a diminutive right P1. PCAs perfused to their distal aspects without stenosis. The bilateral posterior communicating arteries are not visualized. Anatomic variants: Fetal origin of the left PCA.  Near fetal origin of the right PCA. MRA NECK FINDINGS The aorta is poorly visualized; however there is suggestion of a dissection extending from the aorta into the brachiocephalic and right common carotid artery (series 19, image 135 and series 1113, image 6), although this may be artifactual. Common, internal, and external carotid arteries are otherwise patent, without hemodynamically significant stenosis. The origins of the vertebral arteries are poorly evaluated secondary to artifact. The remainder of the extracranial vertebral arteries are patent, without hemodynamically significant stenosis. Other: None IMPRESSION: 1. No acute intracranial process. 2. Multiple small foci of hemosiderin deposition, primarily in the cerebellar hemispheres but also in the inferior cerebral hemispheres. These are nonspecific and most likely the consequence of hypertensive microhemorrhages, although they can also be seen in the setting of cerebral amyloid angiopathy. 3. Suggestion of aortic dissection extending into the brachiocephalic and right common carotid artery, although this may be artifactual. If there is clinical concern for dissection, consider CTA neck or CTA chest. 4. No hemodynamically significant stenosis in the neck. 5. No intracranial large vessel occlusion or significant stenosis. These results were called by telephone at the time of interpretation on 08/27/2021 at 1:41 am to provider TIMOTHY OPYD , who verbally acknowledged these results. Electronically Signed   By: Wiliam Ke M.D.   On: 08/27/2021 01:40  ? ?MR ANGIO NECK WO CONTRAST ? ?Result Date: 08/27/2021 ?CLINICAL DATA:  Stroke code, right arm numbness EXAM: MRI HEAD WITHOUT CONTRAST MRA HEAD WITHOUT CONTRAST  MRA NECK WITHOUT CONTRAST TECHNIQUE: Multiplanar, multi-echo pulse sequences of the brain and surrounding structures were acquired without intravenous contrast. Angiographic images of the Circle of Willis were acquired using MRA technique without  intravenous contrast. Angiographic images of the neck were acquired using MRA technique without intravenous contrast. Carotid stenosis measurements (when applicable) are obtained utilizing NASCET criteria, usin

## 2021-08-27 NOTE — Progress Notes (Signed)
SLP Cancellation Note ? ?Patient Details ?Name: Ryan [name] ?MRN: [id number] ?DOB: 09/10/1965 ? ? ?Cancelled treatment:       Reason Eval/Treat Not Completed: SLP screened, no needs identified, will sign off (chart reviewed; consulted pt in room during breakfast meal) ?Pt denied any difficulty swallowing and is currently on a regular diet; tolerates swallowing pills w/ water per NSG. Pt fed himself sips of liquids during visit w/ no overt deficits noted. Pt conversed in conversation w/out expressive/receptive deficits noted; pt denied any speech-language deficits. Speech clear. Pt was oriented x3 during conversation; quite pleasant in conversation.  ?No further skilled ST services indicated as pt appears at his baseline. Pt agreed. NSG to reconsult if any change in status while admitted.   ? ? ? ? ? ? ?Jerilynn Som, MS, CCC-SLP ?Speech Language Pathologist ?Rehab Services; [facility] [place] ?[phone] (ascom) ?Mendi Constable ?08/27/2021, 9:28 AM ?

## 2021-08-27 NOTE — Progress Notes (Signed)
PT Cancellation Note ? ?Patient Details ?Name: Ryan [name] ?MRN: [id number] ?DOB: 12-12-1965 ? ? ?Cancelled Treatment:    Reason Eval/Treat Not Completed: PT screened, no needs identified, will sign off (Pt reports full resolution of symptoms. Pt denies any acute changes in strength, balance, or basic mobility. No PT evaluation warranted at this time- PT will sign off and defer any additional workup to outpatient PT on as need basis.) ? ?10:22 AM, 08/27/21 ?Rosamaria Lints, PT, DPT ?Physical Therapist - [place] ?[facility]  ?[phone] (ASCOM)  ? ? ?Ryan [name] ?08/27/2021, 10:22 AM ?

## 2021-08-27 NOTE — Clinical Social Work Note (Signed)
?  Transition of Care (TOC) Screening Note ? ? ?Patient Details  ?Name: Ryan [name] ?Date of Birth: 1966/01/01 ? ? ?Transition of Care (TOC) CM/SW Contact:    ?Akansha Wyche A Lakecia Deschamps, LCSW ?Phone [phone] ?08/27/2021, 9:20 AM ? ? ? ?Transition of Care Department [facility]) has reviewed patient and no TOC needs have been identified at this time. We will continue to monitor patient advancement through interdisciplinary progression rounds. If new patient transition needs arise, please place a TOC consult. ? ? ?

## 2021-08-27 NOTE — Progress Notes (Signed)
ANTICOAGULATION CONSULT NOTE - Initial Consult ? ?Pharmacy Consult for  Warfarin  ?Indication:  Mechanical heart valve  ? ?Allergies  ?Allergen Reactions  ? Alitraq Rash  ? Iodinated Contrast Media Rash  ? Iodine Rash  ?  CT dye ?CT dye ? ?CT dye ?CT dye  ? ? ?Patient Measurements: ?Height: 5\' 8"  (172.7 cm) ?Weight: 78.5 kg (173 lb) ?IBW/kg (Calculated) : 68.4 ?Heparin Dosing Weight:  ? ?Vital Signs: ?Temp: 97.5 ?F (36.4 ?C) (03/15 0749) ?Temp Source: Oral (03/15 [id number]) ?BP: 129/65 (03/15 0749) ?Pulse Rate: 56 (03/15 0749) ? ?Labs: ?Recent Labs  ?  08/26/21 ?2036 08/27/21 ?0457  ?HGB 12.3*  --   ?HCT 36.7*  --   ?PLT 176  --   ?APTT 38*  --   ?LABPROT 25.0* 28.0*  ?INR 2.3* 2.6*  ?CREATININE 0.92  --   ? ? ? ?Estimated Creatinine Clearance: 88.8 mL/min (by C-G formula based on SCr of 0.92 mg/dL). ? ? ?Medical History: ?Past Medical History:  ?Diagnosis Date  ? Anemia   ? Aortic aneurysm and dissection ([place])   ? Aortic valvular disease   ? Arrhythmia   ? Atrial fibrillation ([place])   ? Chronic anticoagulation   ? Coronary atherosclerosis   ? 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 Fatty liver   ? GERD (gastroesophageal reflux disease)   ? Heart murmur   ? Hyperlipidemia   ? Hypertension   ? Hypolipidemia   ? Iron deficiency   ? Pulmonary nodule   ? Sleep apnea   ? Stroke [facility])   ? ? ?Medications:  ? ? ?Assessment: ?Pharmacy consulted to dose warfarin in this 56 year old male admitted with mechanical heart valve, Afib.    ?Pt was on warfarin 5 mg PO on Mon, Wed and Sat and warfarin 2.5 mg PO Tue, Thur, Fri and Sun. ?Last dose on 3/14 @ 0200.  ? ?3/14:  INR @ 2036 = 2.3  ?3/15:  INR @ 0539 = 2.6 ? ?Baseline labs:  ?HgB 12.3, Plts 176 ? ?Goal of Therapy:  ?INR 2.5 - 3.5  ?Monitor platelets by anticoagulation protocol: Yes ?  ?Plan: INR is therapeutic.  ?Patient received 1.25mg  PO dose on 3/15 @ 0111 to bring INR into therapeutic range. ?Predict INR to trend up tomorrow  ?Will give warfarin 2.5mg  x1 today and  continue home regimen upon discharge. ?Monitor INR daily, CBC at least every 3 days per protocol. ? ? ?Herby Abraham, PharmD Candidate ?08/27/2021,8:59 AM ? ? ?

## 2021-08-27 NOTE — Progress Notes (Signed)
*  PRELIMINARY RESULTS* ?Echocardiogram ?2D Echocardiogram has been performed. ? ?Ryan [name], Sonia Side ?08/27/2021, 10:41 AM ?

## 2021-08-28 LAB — HEMOGLOBIN A1C
Hgb A1c MFr Bld: 5.4 % (ref 4.8–5.6)
Mean Plasma Glucose: 108 mg/dL

## 2021-09-06 ENCOUNTER — Emergency Department: Payer: Medicaid Other

## 2021-09-06 ENCOUNTER — Emergency Department
Admission: EM | Admit: 2021-09-06 | Discharge: 2021-09-06 | Disposition: A | Discharge status: Home / Self Care | Payer: Medicaid Other | Attending: Emergency Medicine | Admitting: Emergency Medicine

## 2021-09-06 DIAGNOSIS — H538 Other visual disturbances: Secondary | ICD-10-CM | POA: Insufficient documentation

## 2021-09-06 DIAGNOSIS — Z7901 Long term (current) use of anticoagulants: Secondary | ICD-10-CM | POA: Diagnosis not present

## 2021-09-06 DIAGNOSIS — H539 Unspecified visual disturbance: Secondary | ICD-10-CM

## 2021-09-06 DIAGNOSIS — D649 Anemia, unspecified: Secondary | ICD-10-CM | POA: Insufficient documentation

## 2021-09-06 DIAGNOSIS — N189 Chronic kidney disease, unspecified: Secondary | ICD-10-CM | POA: Diagnosis not present

## 2021-09-06 LAB — PROTIME-INR
INR: 2.9 — ABNORMAL HIGH (ref 0.8–1.2)
Prothrombin Time: 30.1 seconds — ABNORMAL HIGH (ref 11.4–15.2)

## 2021-09-06 LAB — CBC WITH DIFFERENTIAL/PLATELET
Abs Immature Granulocytes: 0.06 10*3/uL (ref 0.00–0.07)
Basophils Absolute: 0 10*3/uL (ref 0.0–0.1)
Basophils Relative: 0 %
Eosinophils Absolute: 0.1 10*3/uL (ref 0.0–0.5)
Eosinophils Relative: 2 %
HCT: 36.9 % — ABNORMAL LOW (ref 39.0–52.0)
Hemoglobin: 12.3 g/dL — ABNORMAL LOW (ref 13.0–17.0)
Immature Granulocytes: 1 %
Lymphocytes Relative: 23 %
Lymphs Abs: 1.3 10*3/uL (ref 0.7–4.0)
MCH: 31.1 pg (ref 26.0–34.0)
MCHC: 33.3 g/dL (ref 30.0–36.0)
MCV: 93.4 fL (ref 80.0–100.0)
Monocytes Absolute: 0.4 10*3/uL (ref 0.1–1.0)
Monocytes Relative: 7 %
Neutro Abs: 3.7 10*3/uL (ref 1.7–7.7)
Neutrophils Relative %: 67 %
Platelets: 168 10*3/uL (ref 150–400)
RBC: 3.95 MIL/uL — ABNORMAL LOW (ref 4.22–5.81)
RDW: 14.3 % (ref 11.5–15.5)
WBC: 5.6 10*3/uL (ref 4.0–10.5)
nRBC: 0 % (ref 0.0–0.2)

## 2021-09-06 LAB — BASIC METABOLIC PANEL
Anion gap: 7 (ref 5–15)
BUN: 10 mg/dL (ref 6–20)
CO2: 26 mmol/L (ref 22–32)
Calcium: 8.6 mg/dL — ABNORMAL LOW (ref 8.9–10.3)
Chloride: 104 mmol/L (ref 98–111)
Creatinine, Ser: 1.08 mg/dL (ref 0.61–1.24)
GFR, Estimated: >60 mL/min (ref >60)
Glucose, Bld: 136 mg/dL — ABNORMAL HIGH (ref 70–99)
Potassium: 3.5 mmol/L (ref 3.5–5.1)
Sodium: 137 mmol/L (ref 135–145)

## 2021-09-06 IMAGING — CT CT HEAD W/O CM
4 series · 17 of 47 positions shown, 19 images · non-contrast
Comparison: Chest CT [DATE].

CLINICAL DATA: 56-year-old male with history of acute onset of
neurologic deficit. Headache and visual changes.



[Series 2: head wo · axial · 0.43mm/px · z∈[-87,+18]mm · 7 of 29 slices shown, 9 images]
[im 4/29  brain]
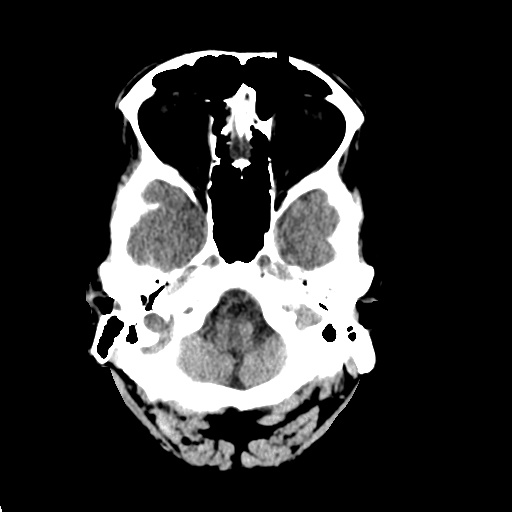
[im 4/29  bone]
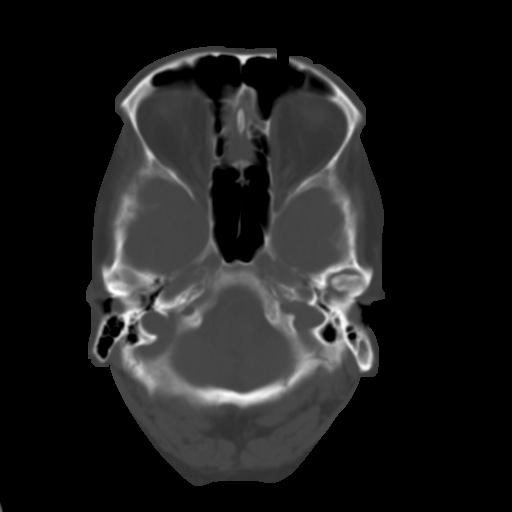
[im 8/29  brain]
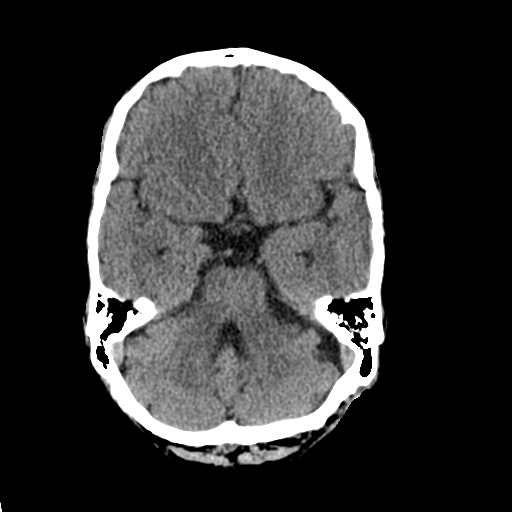
[im 11/29  brain]
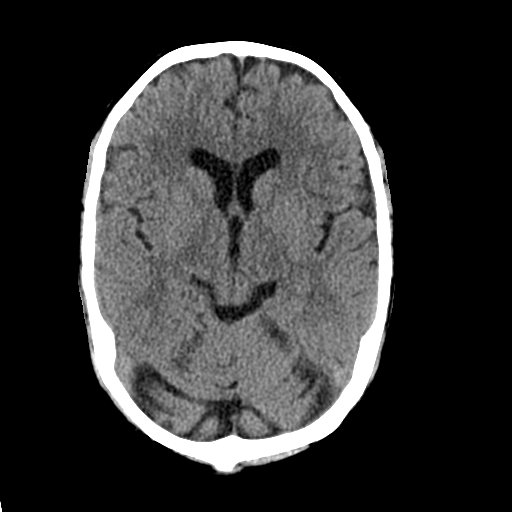
[im 15/29  brain]
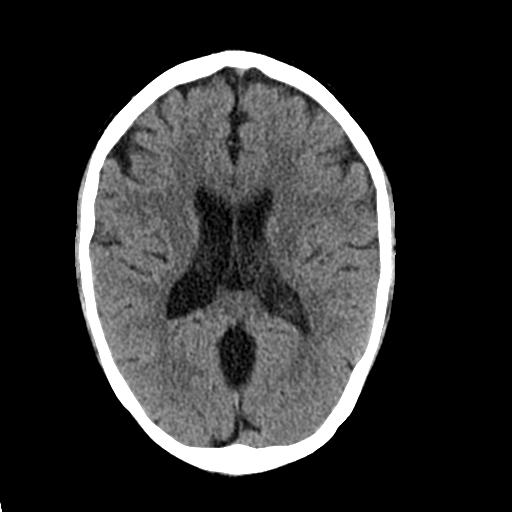
[im 18/29  brain]
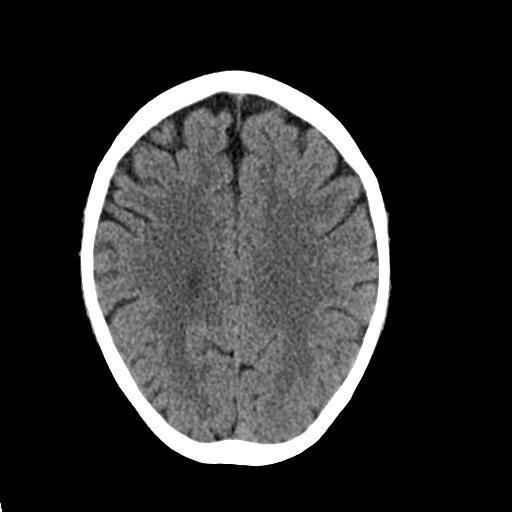
[im 18/29  bone]
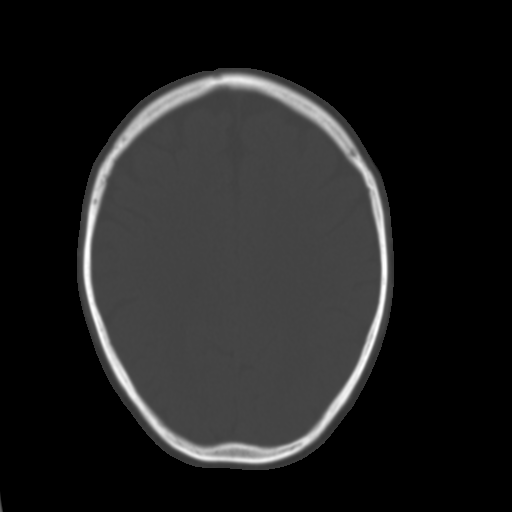
[im 22/29  brain]
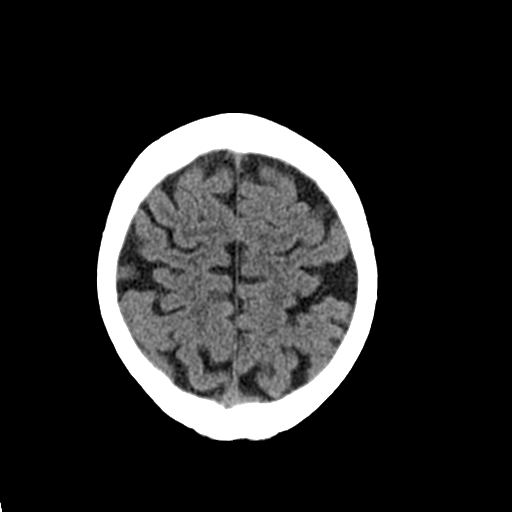
[im 25/29  brain]
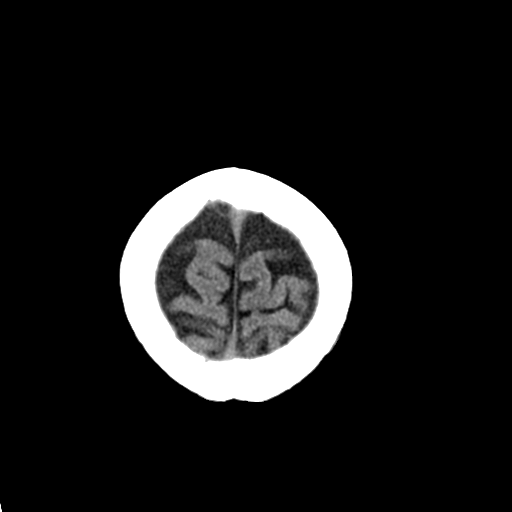

[Series 3: head bone · axial · 0.43mm/px · z∈[-88,-40]mm · 4 of 71 slices shown]
[im 8/71  bone]
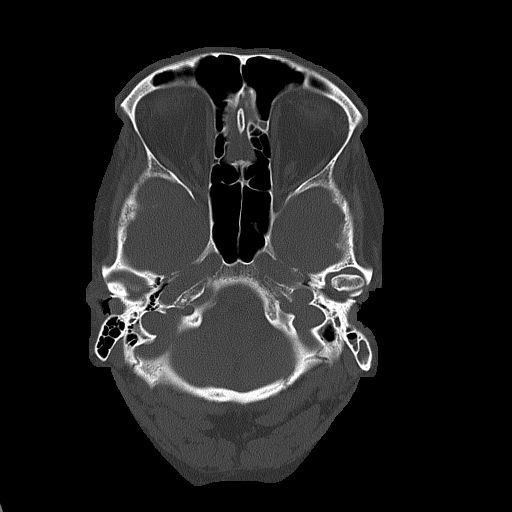
[im 15/71  bone]
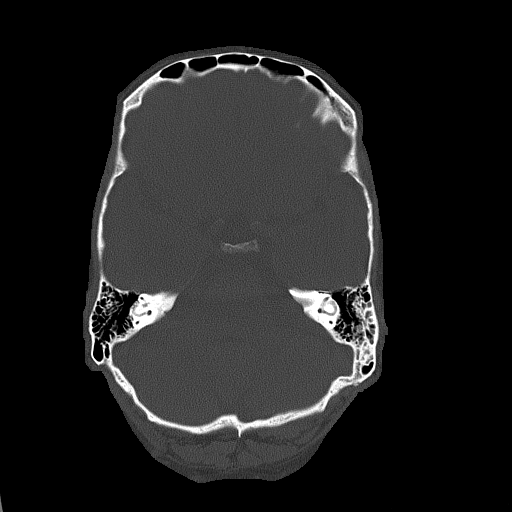
[im 22/71  bone]
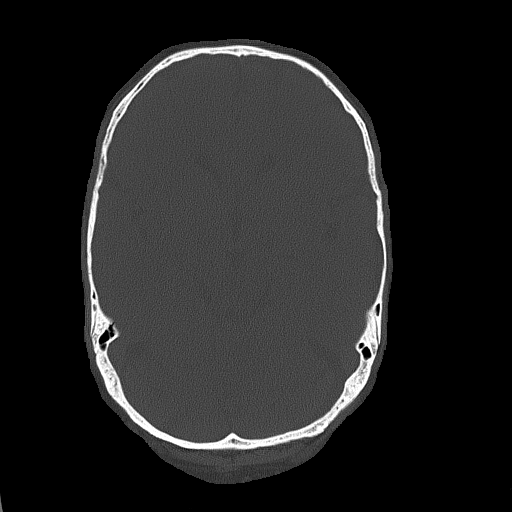
[im 32/71  bone]
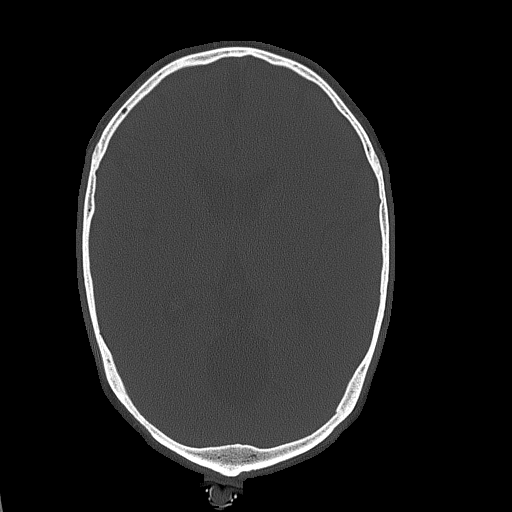

[Series 4: coronal soft tissue · coronal · 0.31mm/px · 3 of 66 slices shown]
[im 22/66  brain]
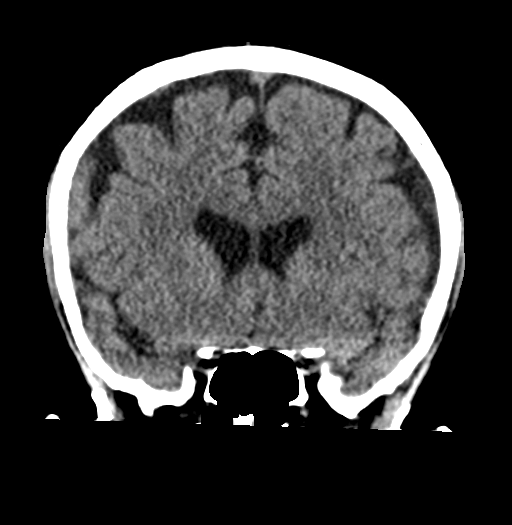
[im 29/66  brain]
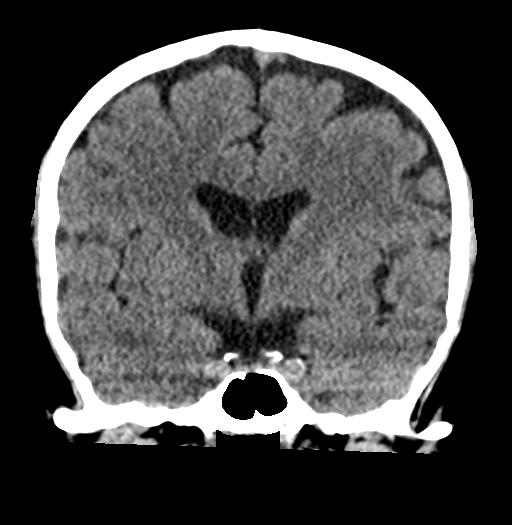
[im 37/66  brain]
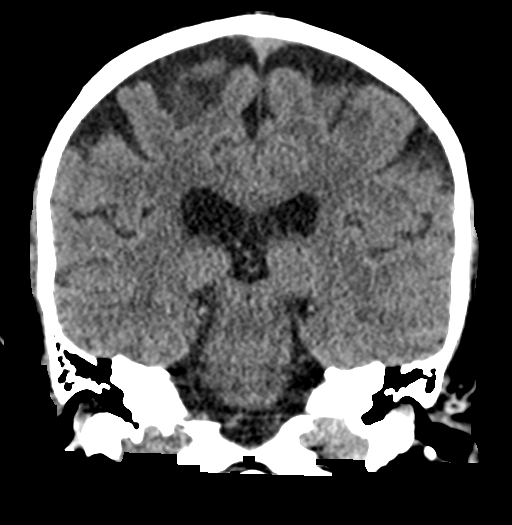

[Series 5: sagittal soft tissue · sagittal · 0.31mm/px · 3 of 53 slices shown]
[im 18/53  brain]
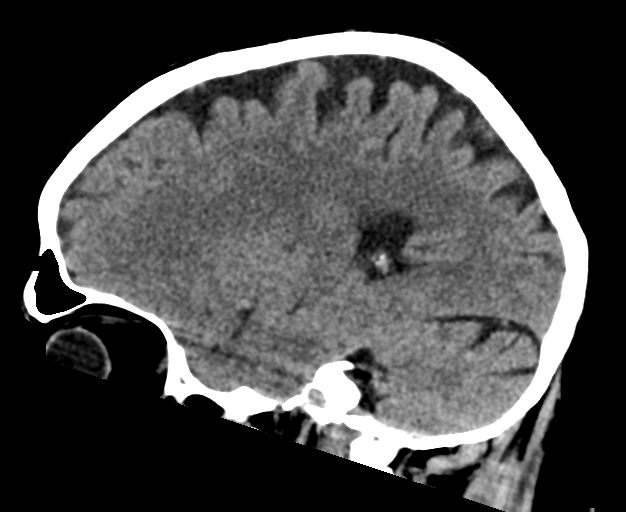
[im 27/53  brain]
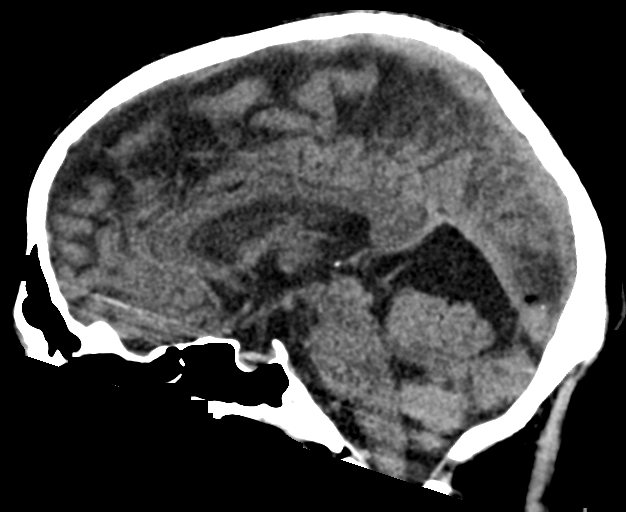
[im 35/53  brain]
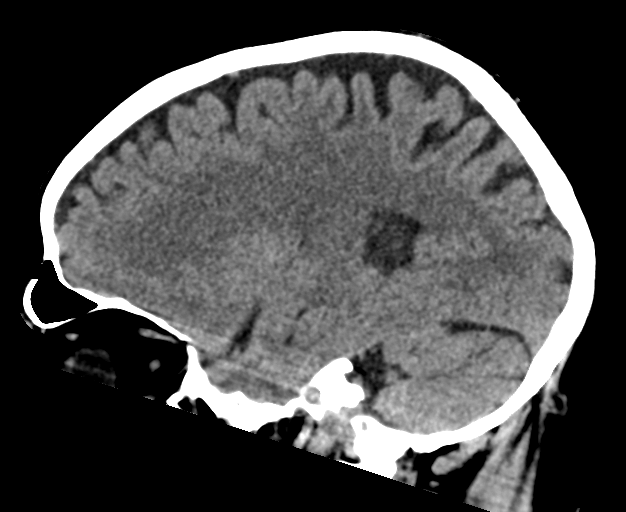

[17 of 47 positions shown; findings below may reference images not displayed]

FINDINGS: Brain: No evidence of acute infarction, hemorrhage, hydrocephalus,
extra-axial collection or mass lesion/mass effect. Mild cerebellar
atrophy. Arachnoid cyst in the superior aspect of the posterior
cranial fossa, similar to prior examination.

Vascular: No hyperdense vessel or unexpected calcification.

Skull: Normal. Negative for fracture or focal lesion.

Sinuses/Orbits: No acute finding.

Other: None.
IMPRESSION: 1. No acute intracranial abnormalities. The appearance of the brain
is unchanged compared to the prior examination, as above.

## 2021-09-06 NOTE — ED Notes (Signed)
Pt taken to CT.

## 2021-09-06 NOTE — ED Provider Notes (Signed)
? ?[facility] ?Provider Note ? ? ? Event Date/Time  ? First MD Initiated Contact with Patient 09/06/21 [phone]   ?  (approximate) ? ? ?History  ? ?Eye Problem and Headache ? ? ?HPI ? ?Ryan [name] is a 56 y.o. 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  He presents tonight for evaluation of visual changes.  He reports that he was watching TV around 2:30 AM and he noticed black spots on the lateral aspects of his vision.  He reports that if he covered his right eye he is dark spots on the left lateral visual field only.  If he covered his left eye he only saw dark spots on the right lateral visual field.  The symptoms then resolved on their own. ? ?He continued watching TV but then about an hour later he said that it was as if he was looking through a window screen.  He could still see everything clearly but it appeared that a screen pattern was in his vision.  He came to the ED for further evaluation. ? ?He denies any numbness or weakness in any extremities.  He reports that the visual changes have persisted.  He has a mild frontal headache that started after the onset of the visual changes.  No difficulty speaking or finding words.  No recent infectious symptoms.  Denies chest pain or shortness of breath as well as nausea and vomiting. ?  ? ? ?Physical Exam  ? ?Triage Vital Signs: ?ED Triage Vitals  ?Enc Vitals Group  ?   BP 09/06/21 0320 133/70  ?   Pulse Rate 09/06/21 0320 68  ?   Resp 09/06/21 0320 20  ?   Temp 09/06/21 0320 98.7 ?F (37.1 ?C)  ?   Temp Source 09/06/21 0320 Oral  ?   SpO2 09/06/21 0320 95 %  ?   Weight --   ?   Height --   ?   Head Circumference --   ?   Peak Flow --   ?   Pain Score 09/06/21 0332 5  ?   Pain Loc --   ?   Pain Edu? --   ?   Excl. in [place]? --    ? ? ?Most recent vital signs: ?Vitals:  ? 09/06/21 0516 09/06/21 0615  ?BP: 109/65 107/68  ?Pulse: 66 64  ?Resp: 20 20  ?Temp:    ?SpO2: 97% 95%  ? ? ? ?General: Awake, no distress.  ?CV:  Good peripheral perfusion.  ?Resp:  Normal effort.  ?Abd:  No distention.  ?Other:  Normal pupillary exam; patient has some chronic changes in the left eye due to a cataract surgery but his pupils are equal and reactive, extraocular motion is intact.  He has normal peripheral vision and acuity.  No dysmetria with finger-to-nose testing, no numbness or weakness in his extremities, good extremity strength, no balance issues or difficulty with ambulation. ? ? ?ED Results / Procedures / Treatments  ? ?Labs ?(all labs ordered are listed, but only abnormal results are displayed) ?Labs Reviewed  ?PROTIME-INR - Abnormal; Notable for the following components:  ?    Result Value  ? Prothrombin Time 30.1 (*)   ? INR 2.9 (*)   ? All other components within normal limits  ?CBC WITH DIFFERENTIAL/PLATELET - Abnormal;  Notable for the following components:  ? RBC 3.95 (*)   ? Hemoglobin 12.3 (*)   ? HCT 36.9 (*)   ? All other components within normal limits  ?BASIC METABOLIC PANEL - Abnormal; Notable for the following components:  ? Glucose, Bld 136 (*)   ? Calcium 8.6 (*)   ? All other components within normal limits  ? ? ? ?EKG ? ?ED ECG REPORT ?ILoleta Rose, the attending physician, personally viewed and interpreted this ECG. ? ?Date: 09/06/2021 ?EKG Time: 5:14 AM ?Rate: 58 ?Rhythm: Borderline sinus bradycardia ?QRS Axis: normal ?Intervals: Left bundle branch block ?ST/T Wave abnormalities: Non-specific ST segment / T-wave changes, but no clear evidence of acute ischemia. ?Narrative Interpretation: no definitive evidence of acute ischemia; does not meet STEMI criteria. ? ? ? ?RADIOLOGY ?No acute abnormalities identified on head CT. ? ? ? ?PROCEDURES: ? ?Critical Care performed: No ? ?Procedures ? ? ?MEDICATIONS ORDERED IN ED: ?Medications -  No data to display ? ? ?IMPRESSION / MDM / ASSESSMENT AND PLAN / ED COURSE  ?I reviewed the triage vital signs and the nursing notes. ?             ?               ? ?Differential diagnosis includes, but is not limited to, optic neuritis, CVA, TIA, retinal vein or artery occlusion, vitreous detachment, retinal detachment. ? ?Patient's symptoms are somewhat confusing and not consistent with any 1 particular diagnosis or neurological pattern.  He has extensive chronic medical issues that could contribute, including but not limited to his long-term anticoagulation with warfarin, but the symptoms he is describing do not seem somewhat inconsistent. ? ?Given his comorbidities and nonspecific symptoms, we will begin evaluation with a CT head without contrast to look for any evidence of bleeding in the setting of warfarin anticoagulation.  I will check basic lab work including CBC, BMP, and pro time INR.  If his symptoms continue or worsen and he has a reassuring head CT, anticipate that he may benefit from an MRI.  I do not think it is likely he is having an acute CVA, and there is no benefit to a teleneurology evaluation at this point as he is not a candidate for tPA given his anticoagulation and minimal symptoms.  He agrees with the current plan of care. ? ? ? ? ?Clinical Course as of 09/06/21 0751  ?Sat Sep 06, 2021  ?2376 CT HEAD WO CONTRAST ( ) ?I personally reviewed the patient's CT head and see no evidence of bleeding or other acute abnormality.  Radiologist confirms that there is no evidence of acute abnormality and no change from prior. [CF]  ?0531 CBC with Differential(!) ?Reassuring CBC with differential with an essentially normal hemoglobin and no leukocytosis [CF]  ?2831 I reassessed the patient and he has been sleeping comfortably.  He said that his symptoms have gone away completely.  They have been gone for more than an hour.  His vision is completely back to normal.  He has no pain. ? ?I explained the  results of his CT scan and explained that our next step would be to get an MRI if he has persistent issues.  However given that he has recently been worked up for stroke like symptoms and he is anticoagulated on warfarin, it is his choice if she wants to pursue additional evaluation or follow-up as an outpatient.  He said that he would like to go home.  I explained  to him that I cannot definitively tell him that nothing emergent is happening, and he says that he understands but he wants to go home.  He said that he would like a work note for later tonight but he is comfortable going home for now. ? ?I gave him strong recommendations to follow-up as an outpatient with his neurologist and ophthalmologist and I gave him some contact information should he need to do so.  I also gave strict return precautions.  He understands and agrees with the plan. [CF]  ?  ?Clinical Course User Index ?[CF] Loleta Rose, MD  ? ? ? ?FINAL CLINICAL IMPRESSION(S) / ED DIAGNOSES  ? ?Final diagnoses:  ?Visual changes  ? ? ? ?Rx / DC Orders  ? ?ED Discharge Orders   ? ? None  ? ?  ? ? ? ?Note:  This document was prepared using Dragon voice recognition software and may include unintentional dictation errors. ?  ?Loleta Rose, MD ?09/06/21 [phone] ? ?

## 2021-09-06 NOTE — ED Triage Notes (Signed)
Pt states that tonight around 0230 he was watching TV when he noticed black spots in his vision. The pt states that now it appears he is looking through a screen in both eyes. He reports that he developed a headache after the visual changes.  ?

## 2021-09-06 NOTE — Discharge Instructions (Signed)
As we discussed, your evaluation was generally reassuring although we do not have a good reason for the symptoms you experienced.  We offered to obtain an MRI for further evaluation of possible stroke, but given that your symptoms have completely resolved and that you were just seen at a hospital for strokelike symptoms, your preference is to go home.  We think that is appropriate but encourage you to follow-up as an outpatient with your neurologist and eye doctor.  You were given contact information for the [facility] if you need an eye doctor that you can call.  Return to the emergency department if you develop new or worsening symptoms that concern you. ?

## 2021-09-06 NOTE — ED Notes (Signed)
Per ems pt with blurry vision for 30 minutes, feels like he has a screen over his eyes.  ?

## 2021-09-17 ENCOUNTER — Ambulatory Visit (INDEPENDENT_AMBULATORY_CARE_PROVIDER_SITE_OTHER): Payer: Medicaid Other

## 2021-09-17 DIAGNOSIS — Z952 Presence of prosthetic heart valve: Secondary | ICD-10-CM | POA: Diagnosis not present

## 2021-09-17 DIAGNOSIS — Z5181 Encounter for therapeutic drug level monitoring: Secondary | ICD-10-CM

## 2021-09-17 DIAGNOSIS — Z7901 Long term (current) use of anticoagulants: Secondary | ICD-10-CM

## 2021-09-17 LAB — POCT INR: INR: 3.6 — AB (ref 2.0–3.0)

## 2021-09-17 NOTE — Patient Instructions (Signed)
-   skip warfarin tonight, then ?- continue current dosage of warfarin 5 mg - 1/2 tablet every day EXCEPT 1 whole tablet on MONDAYS, [place]. ?- Recheck INR in 3 weeks ?

## 2021-09-22 ENCOUNTER — Encounter: Payer: Self-pay | Admitting: Internal Medicine

## 2021-09-22 ENCOUNTER — Other Ambulatory Visit: Payer: Self-pay

## 2021-09-22 MED ORDER — WARFARIN SODIUM 5 MG PO TABS: 2.5000 mg | ORAL_TABLET | Freq: Every day | ORAL | 1 refills | Status: DC

## 2021-09-27 ENCOUNTER — Emergency Department
Admission: EM | Admit: 2021-09-27 | Discharge: 2021-09-28 | Disposition: A | Discharge status: Home / Self Care | Payer: Medicaid Other | Attending: Emergency Medicine | Admitting: Emergency Medicine

## 2021-09-27 ENCOUNTER — Emergency Department: Payer: Medicaid Other

## 2021-09-27 DIAGNOSIS — Z8673 Personal history of transient ischemic attack (TIA), and cerebral infarction without residual deficits: Secondary | ICD-10-CM | POA: Insufficient documentation

## 2021-09-27 DIAGNOSIS — R0602 Shortness of breath: Secondary | ICD-10-CM | POA: Diagnosis not present

## 2021-09-27 DIAGNOSIS — R079 Chest pain, unspecified: Secondary | ICD-10-CM | POA: Diagnosis not present

## 2021-09-27 DIAGNOSIS — R778 Other specified abnormalities of plasma proteins: Secondary | ICD-10-CM | POA: Insufficient documentation

## 2021-09-27 DIAGNOSIS — Z7982 Long term (current) use of aspirin: Secondary | ICD-10-CM | POA: Insufficient documentation

## 2021-09-27 LAB — BASIC METABOLIC PANEL
Anion gap: 6 (ref 5–15)
BUN: 10 mg/dL (ref 6–20)
CO2: 26 mmol/L (ref 22–32)
Calcium: 8.5 mg/dL — ABNORMAL LOW (ref 8.9–10.3)
Chloride: 105 mmol/L (ref 98–111)
Creatinine, Ser: 1.06 mg/dL (ref 0.61–1.24)
GFR, Estimated: >60 mL/min (ref >60)
Glucose, Bld: 114 mg/dL — ABNORMAL HIGH (ref 70–99)
Potassium: 3.6 mmol/L (ref 3.5–5.1)
Sodium: 137 mmol/L (ref 135–145)

## 2021-09-27 LAB — CBC WITH DIFFERENTIAL/PLATELET
Abs Immature Granulocytes: 0.04 10*3/uL (ref 0.00–0.07)
Basophils Absolute: 0 10*3/uL (ref 0.0–0.1)
Basophils Relative: 0 %
Eosinophils Absolute: 0.1 10*3/uL (ref 0.0–0.5)
Eosinophils Relative: 2 %
HCT: 36 % — ABNORMAL LOW (ref 39.0–52.0)
Hemoglobin: 11.9 g/dL — ABNORMAL LOW (ref 13.0–17.0)
Immature Granulocytes: 1 %
Lymphocytes Relative: 30 %
Lymphs Abs: 1.7 10*3/uL (ref 0.7–4.0)
MCH: 31.2 pg (ref 26.0–34.0)
MCHC: 33.1 g/dL (ref 30.0–36.0)
MCV: 94.5 fL (ref 80.0–100.0)
Monocytes Absolute: 0.3 10*3/uL (ref 0.1–1.0)
Monocytes Relative: 6 %
Neutro Abs: 3.4 10*3/uL (ref 1.7–7.7)
Neutrophils Relative %: 61 %
Platelets: 139 10*3/uL — ABNORMAL LOW (ref 150–400)
RBC: 3.81 MIL/uL — ABNORMAL LOW (ref 4.22–5.81)
RDW: 14.6 % (ref 11.5–15.5)
WBC: 5.6 10*3/uL (ref 4.0–10.5)
nRBC: 0 % (ref 0.0–0.2)

## 2021-09-27 LAB — TROPONIN I (HIGH SENSITIVITY): Troponin I (High Sensitivity): 28 ng/L — ABNORMAL HIGH (ref <18)

## 2021-09-27 LAB — PROTIME-INR
INR: 1.9 — ABNORMAL HIGH (ref 0.8–1.2)
Prothrombin Time: 21.6 seconds — ABNORMAL HIGH (ref 11.4–15.2)

## 2021-09-27 IMAGING — DX DG CHEST 1V PORT
1 series · 1 of 1 positions shown · non-contrast
Comparison: None.

CLINICAL DATA: Chest pain.

EXAM:
PORTABLE CHEST 1 VIEW

[chest ap]
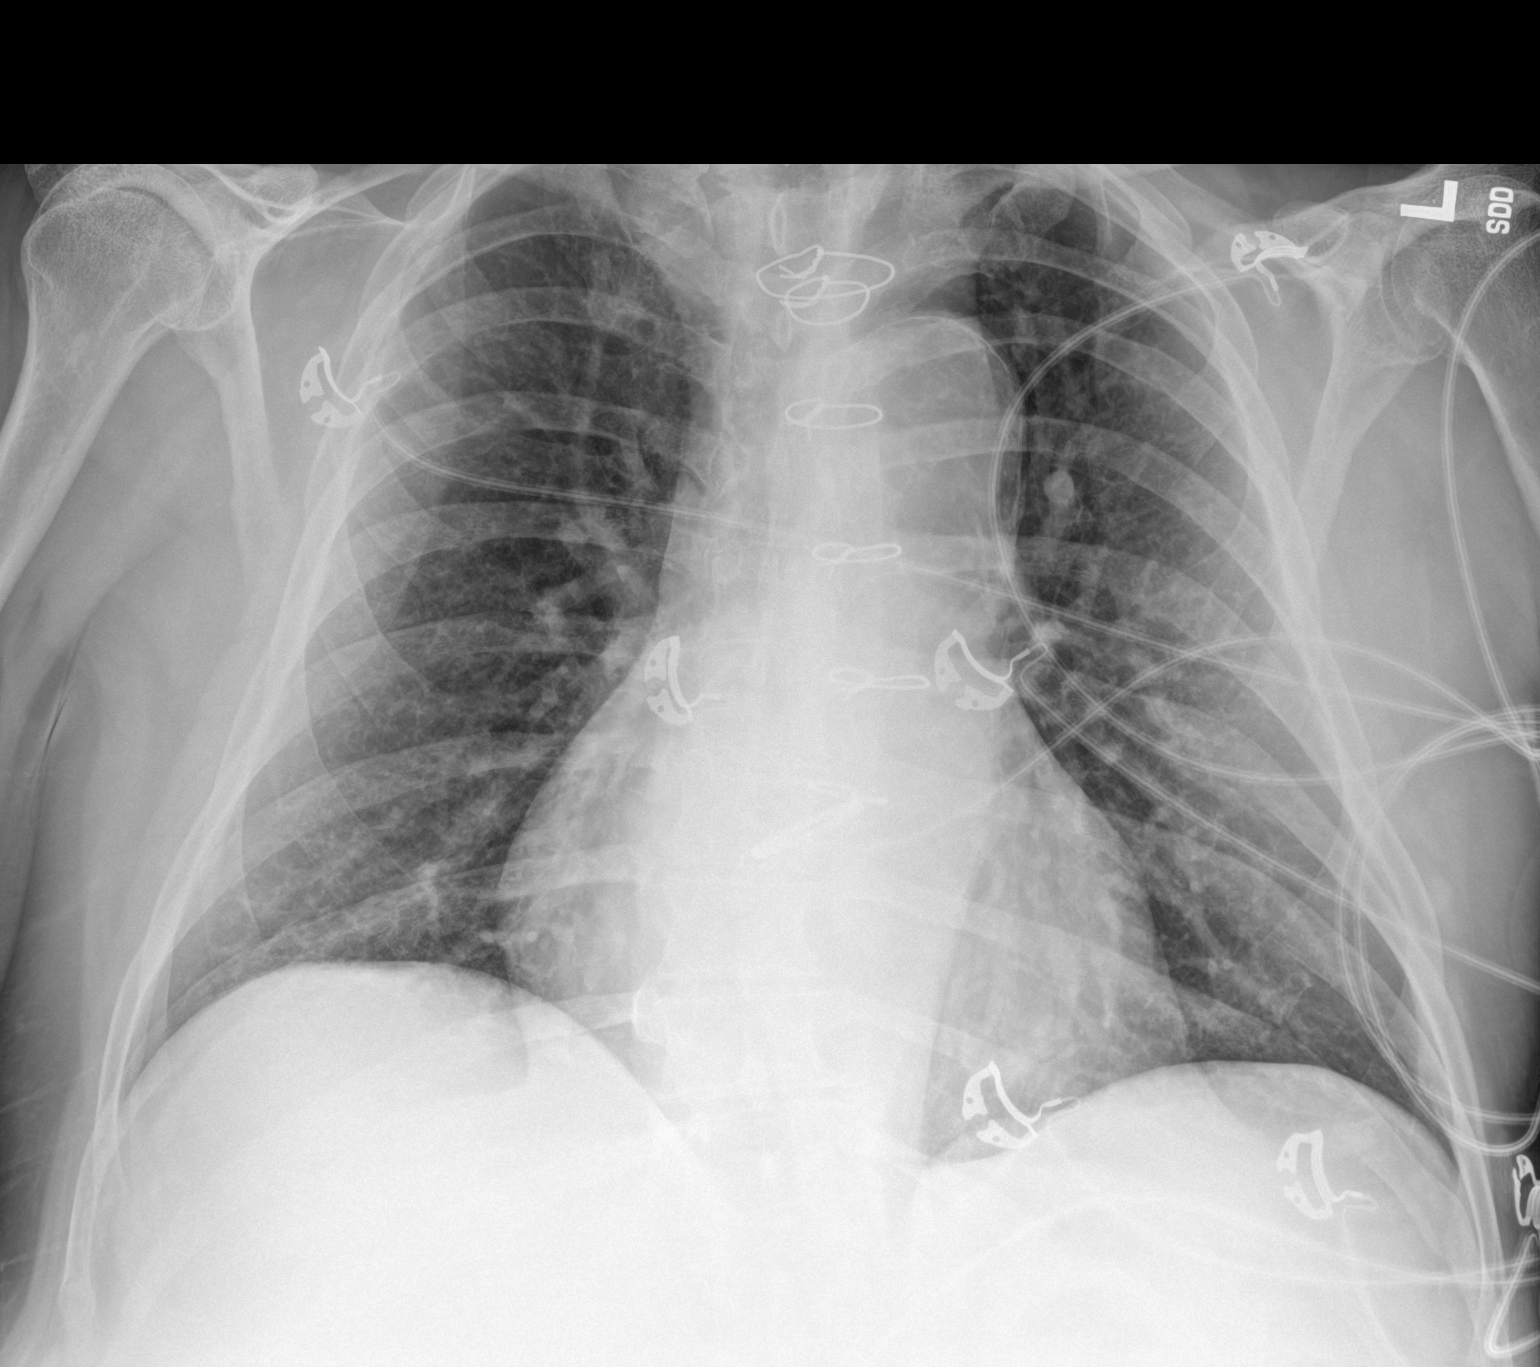

[1 of 1 positions shown; findings below may reference images not displayed]

FINDINGS: Multiple sternal wires are noted. The heart size and mediastinal
contours are within normal limits. Mild atelectasis is seen right
lung base. Both lungs are otherwise clear. There is mild
levoscoliosis of the lower thoracic spine.
IMPRESSION: Evidence of prior median sternotomy without acute or active
cardiopulmonary disease.

## 2021-09-27 MED ORDER — HYDROCORTISONE SOD SUC (PF) 250 MG IJ SOLR
200.0000 mg | Freq: Once | INTRAMUSCULAR | Status: AC
  Administered 2021-09-27: 200 mg via INTRAVENOUS
  Filled 2021-09-27: qty 200

## 2021-09-27 MED ORDER — DIPHENHYDRAMINE HCL 50 MG/ML IJ SOLN
50.0000 mg | Freq: Once | INTRAMUSCULAR | Status: AC
  Administered 2021-09-28: 50 mg via INTRAVENOUS
  Filled 2021-09-27: qty 1

## 2021-09-27 MED ORDER — DIPHENHYDRAMINE HCL 25 MG PO CAPS: 50.0000 mg | ORAL_CAPSULE | Freq: Once | ORAL | Status: AC

## 2021-09-27 NOTE — ED Triage Notes (Signed)
Pt arrive with [facility], stroke 2012, MI 2011, afib pmh, 2 weeks ago TIA. Started having chest pain approx 836 pm. CBG 117 with ems, non-radiating CP. Vss per ems. Pt received 2x nitro spray. 324 aspirin. Pt takes aspirin daily. 18 g in R AC place. Nitro given 818, 827pm ?

## 2021-09-27 NOTE — ED Provider Notes (Signed)
? ?[facility] ?Provider Note ? ? ? Event Date/Time  ? First MD Initiated Contact with Patient 09/27/21 2102   ?  (approximate) ? ? ?History  ? ?Chest Pain ? ? ?HPI ? ?Ryan [name] is a 56 y.o. male  who, per cardiology note dated 03/04/21 has history of aortic dissection repair, TIA, LBBB, who presents to the emergency department today because of concerns for chest pain.  Patient states that he has history of chronic chest pain.  He states he normally lives at a 4 out of 10.  He was at work today when his pain went up to an 8.  Located in the center part of his chest.  It was accompanied by some shortness of breath.  Patient was given nitro and aspirin by EMS.  He states that that took his pain down to a 7.  He does have a history of aortic dissection, aortic valve replacement and MI. ? ?Physical Exam  ? ?Triage Vital Signs: ?ED Triage Vitals [09/27/21 2040]  ?Enc Vitals Group  ?   BP 137/70  ?   Pulse Rate 63  ?   Resp 20  ?   Temp 98.2 ?F (36.8 ?C)  ?   Temp src   ?   SpO2 96 %  ?   Weight 173 lb (78.5 kg)  ?   Height 5\' 8"  (1.727 m)  ?   Head Circumference   ?   Peak Flow   ?   Pain Score 7  ?   Pain Loc   ?   Pain Edu?   ?   Excl. in [place]?   ? ? ?Most recent vital signs: ?Vitals:  ? 09/27/21 2040  ?BP: 137/70  ?Pulse: 63  ?Resp: 20  ?Temp: 98.2 ?F (36.8 ?C)  ?SpO2: 96%  ? ?General: Awake, no distress.  ?CV:  Good peripheral perfusion. Regular rate and rhythm. ?Resp:  Normal effort. Lungs clear to auscultation ?Abd:  No distention. Non tender. ? ?ED Results / Procedures / Treatments  ? ?Labs ?(all labs ordered are listed, but only abnormal results are displayed) ?Labs Reviewed  ?CBC WITH DIFFERENTIAL/PLATELET - Abnormal; Notable for the following components:  ?    Result Value  ? RBC 3.81 (*)   ? Hemoglobin 11.9 (*)   ? HCT 36.0 (*)   ? Platelets 139 (*)   ? All other components within normal limits  ?BASIC METABOLIC PANEL  ?TROPONIN I (HIGH SENSITIVITY)  ? ? ? ?EKG ? ?INance Pear,  attending physician, personally viewed and interpreted this EKG ? ?EKG Time: 2039 ?Rate: 61 ?Rhythm: sinus rhythm ?Axis: normal ?Intervals: qtc 462 ?QRS: LBBB ?ST changes: no st elevation ?Impression: abnormal ekg ? ? ?RADIOLOGY ?I independently interpreted and visualized the cxr. My interpretation: No pneumonia. No pneumothorax. ?Radiology interpretation:  ?   ?IMPRESSION:  ?Evidence of prior median sternotomy without acute or active  ?cardiopulmonary disease.  ?   ? ? ?PROCEDURES: ? ?Critical Care performed: No ? ?Procedures ? ? ?MEDICATIONS ORDERED IN ED: ?Medications - No data to display ? ? ?IMPRESSION / MDM / ASSESSMENT AND PLAN / ED COURSE  ?I reviewed the triage vital signs and the nursing notes. ?             ?               ? ?Differential diagnosis includes, but is not limited to, ACS, pneumonia, pneumothorax, dissection. ? ?Patient presented to the emergency  department today because of concerns for chest pain.  Patient states he does have history of chronic chest pain.  The time my exam his chest pain is improving.  Initial troponin was very minimally elevated at 28.  EKG without findings concerning for ACS.  Given history of aortic dissection in the past I do feel it would be beneficial to evaluate his aorta.  He has contrast allergies.  We will premedicate. ? ?FINAL CLINICAL IMPRESSION(S) / ED DIAGNOSES  ? ?Chest pain ? ? ? ?Note:  This document was prepared using Dragon voice recognition software and may include unintentional dictation errors. ? ?  ?Nance Pear, MD ?09/27/21 2321 ? ?

## 2021-09-28 ENCOUNTER — Other Ambulatory Visit: Payer: Self-pay

## 2021-09-28 ENCOUNTER — Emergency Department: Payer: Medicaid Other

## 2021-09-28 LAB — TROPONIN I (HIGH SENSITIVITY): Troponin I (High Sensitivity): 27 ng/L — ABNORMAL HIGH (ref <18)

## 2021-09-28 IMAGING — CT CT ANGIO CHEST
2 of 8 series · 16 of 46 positions shown · IV contrast (agent unspecified)
Comparison: Portable chest [DATE].

CLINICAL DATA: 56-year-old male with history of aortic dissection.
Chest pain.

EXAM:
CT ANGIOGRAPHY CHEST WITH CONTRAST
TECHNIQUE: Multi detector noncontrast chest images 1st acquired.

[Series 8: thins · axial · 0.83mm/px · z∈[-688,-353]mm · 13 of 624 slices shown]
[im 33/624  lung]
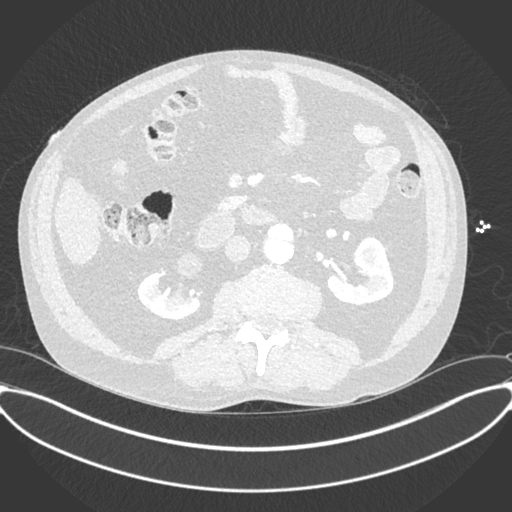
[im 99/624  soft-tissue]
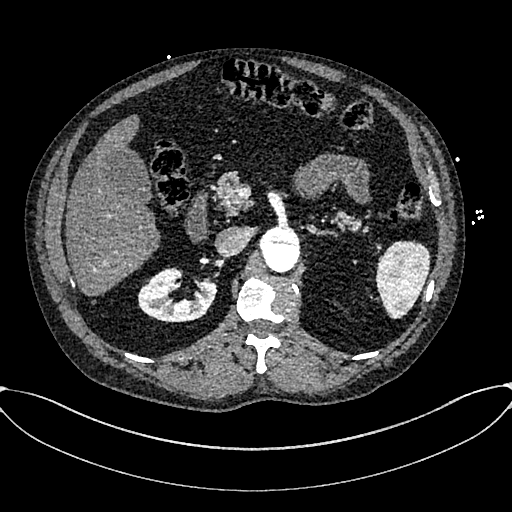
[im 132/624  lung]
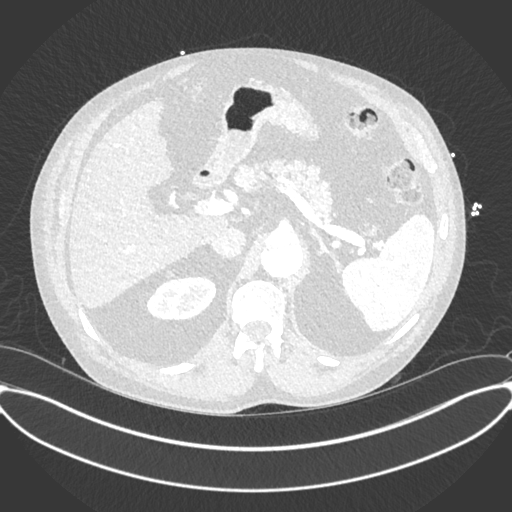
[im 164/624  soft-tissue]
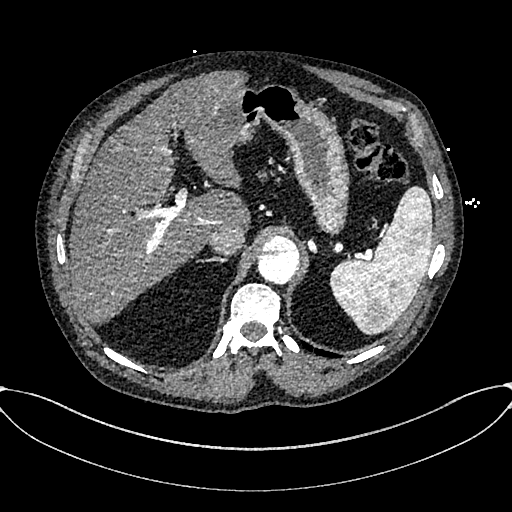
[im 230/624  lung]
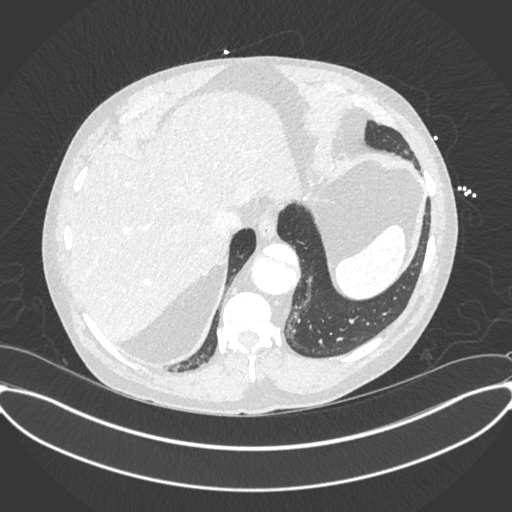
[im 263/624  soft-tissue]
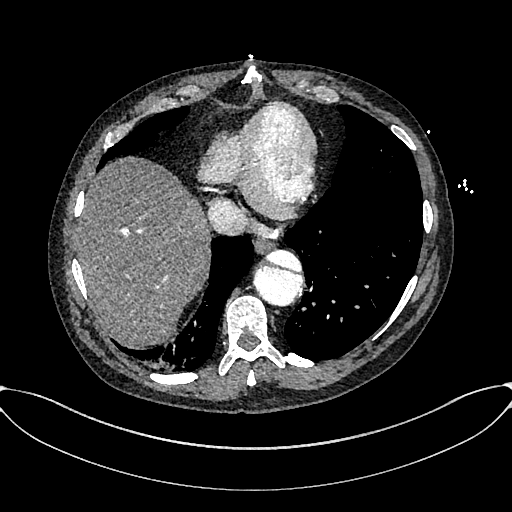
[im 328/624  lung]
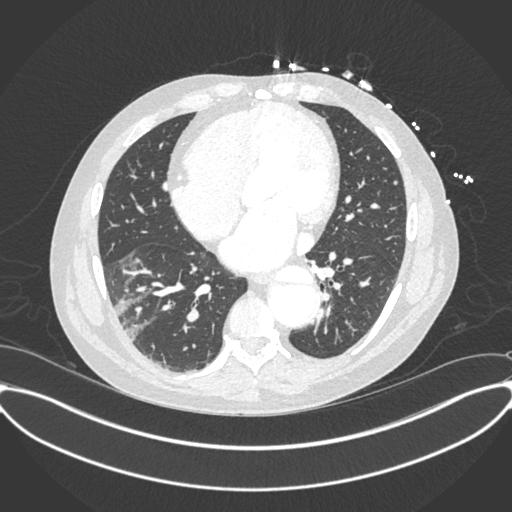
[im 361/624  soft-tissue]
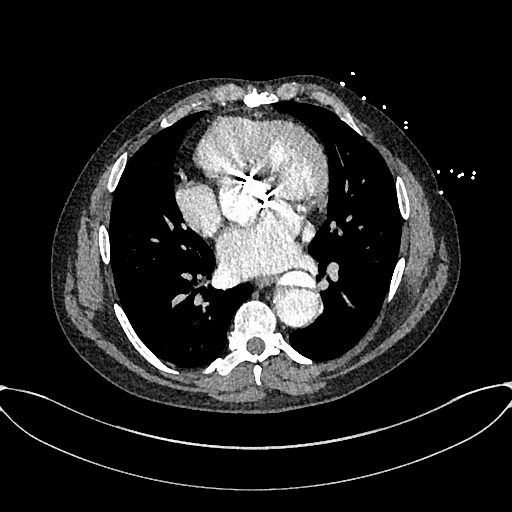
[im 394/624  lung]
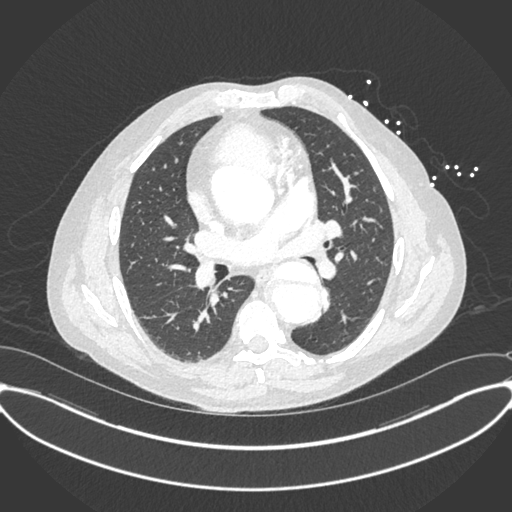
[im 460/624  soft-tissue]
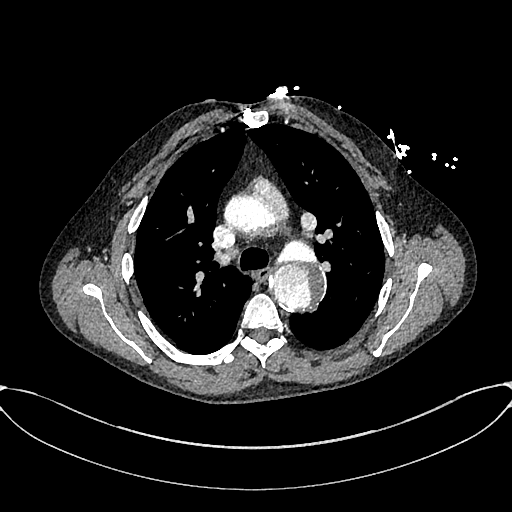
[im 492/624  lung]
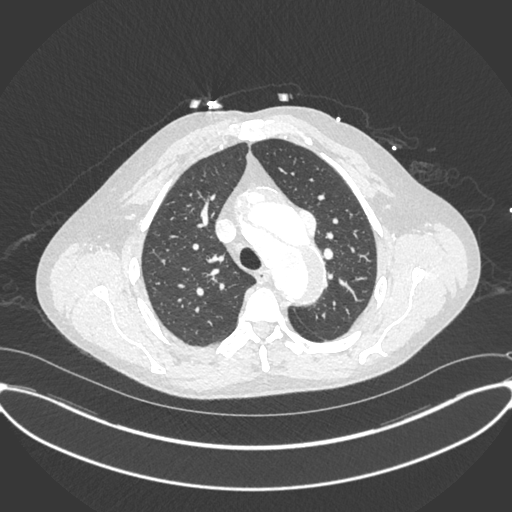
[im 525/624  soft-tissue]
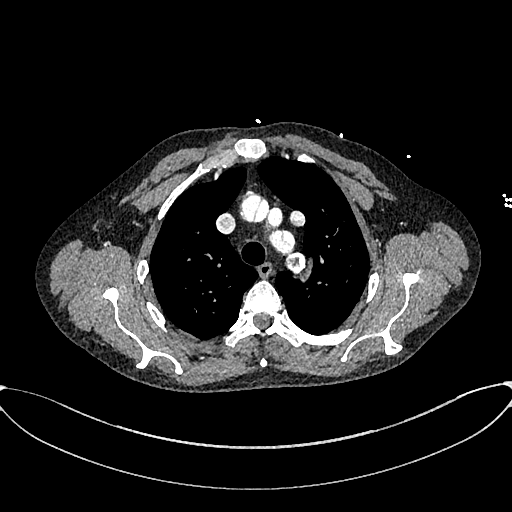
[im 591/624  lung]
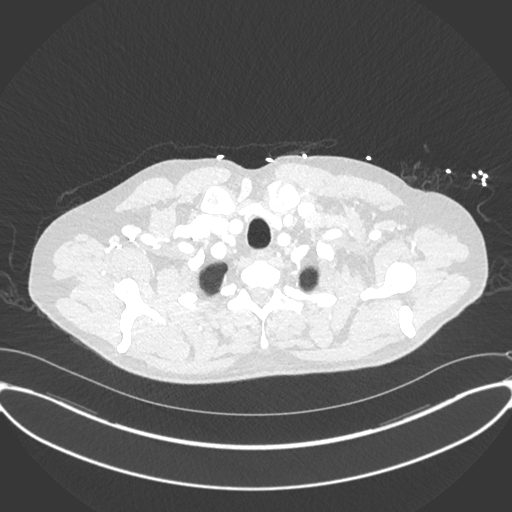

[Series 9: cor · coronal · 0.69mm/px · 3 of 164 slices shown]
[im 41/164  soft-tissue]
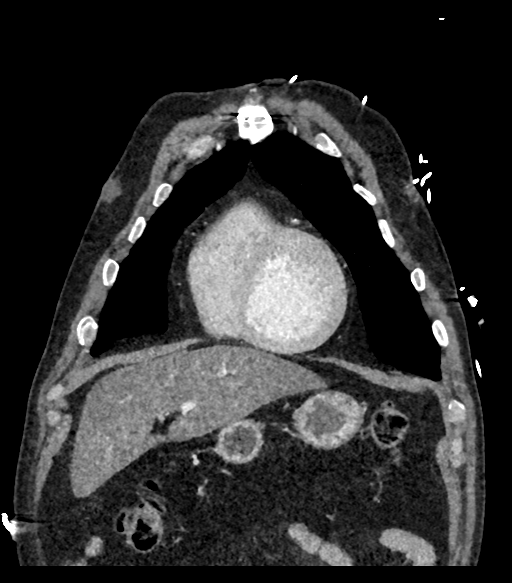
[im 82/164  soft-tissue]
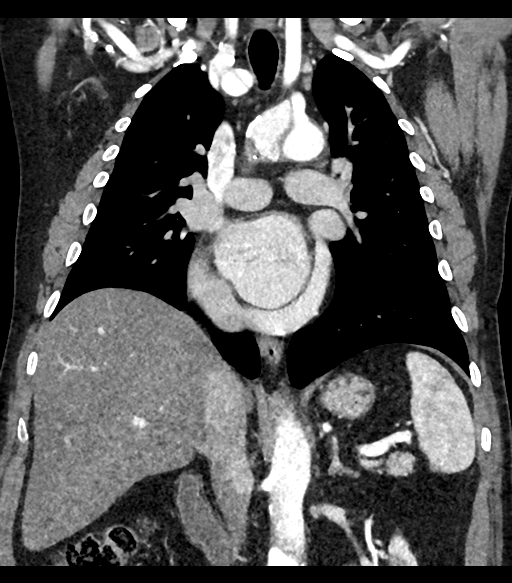
[im 123/164  soft-tissue]
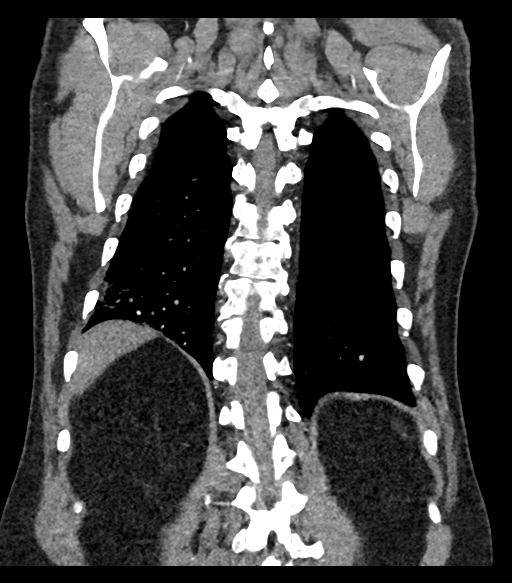

[16 of 46 positions shown; findings below may reference images not displayed]

Multidetector CT imaging of the chest was performed using the
standard protocol during bolus administration of intravenous
contrast. Multiplanar CT image reconstructions and MIPs were
obtained to evaluate the vascular anatomy.

RADIATION DOSE REDUCTION: This exam was performed according to the
departmental dose-optimization program which includes automated
exposure control, adjustment of the mA and/or kV according to
patient size and/or use of iterative reconstruction technique.

CONTRAST:  75mL OMNIPAQUE IOHEXOL 350 MG/ML SOLN
FINDINGS: Cardiovascular:  This study designed for aortic contrast timing.

Previous sternotomy and aortic valve replacement. Globular dilated
aortic root measures up to 51 mm diameter. Evidence of surgically
repaired ascending aorta distal to the root.

Complex aortic dissection begins in the arch at the brachiocephalic
origin. The true lumen is that depicted with a rose on series 8,
image 120. Dissection flap tracks cephalad through the
brachiocephalic artery into the visible Common carotid (series 9,
image 75 and series 8, image 1. A dissection flap extends a short
distance into the proximal right subclavian artery, terminating just
before the vertebral artery origin (series 9, image 87). Patent
right vertebral artery origin. Left CCA is spared. But dissection
flap spirals into the proximal left subclavian artery and terminates
at the left vertebral artery origin (series 8, images 88 through 50.
Left vertebral artery origin remains patent.

Complex partially thrombosed arch dissection then continues from the
great vessels distally. The true lumen is located ventrally and is
the smallest component of the descending aorta, demonstrating the
earliest enhancement on series 8, image 186. See also series 12,
image 101. Superimposed Calcified aortic atherosclerosis.

All told, the abnormally dilated arch measures up to 46 mm diameter
and the proximal descending aorta is up to 54 mm diameter.

Similar configuration of the true and false lumens throughout the
descending aorta and into the visible upper abdomen. Celiac and SMA
arise from the true lumen. Dissection flap extends into the left
main renal artery origin on series 8, image 536 and continues in the
left main renal artery. The right renal artery appears to primarily
arise from the false lumen but remains patent. Dissection continues
infrarenal E. superimposed calcified abdominal aortic
atherosclerosis.

Cardiomegaly. No pericardial effusion. Duplicated SVC (normal
variant). Grossly patent main renal arteries.

Mediastinum/Nodes: Postoperative changes with no mediastinal mass or
lymphadenopathy.

Lungs/Pleura: Major airways are patent. No pleural effusion. Left
lung is essentially clear.

There is streaky nonenhancing airspace opacity in the lateral and
posterior basal segments of the right lower lobe which more
resembles aspiration or infection than atelectasis.

Upper Abdomen: Abdominal aortic dissection detailed above. Hepatic
steatosis. Negative visible gallbladder, spleen, pancreas, adrenal
glands, and bowel in the visible upper abdomen. There is a degree of
bilateral renal cortical atrophy, but the kidneys are symmetrically
enhancing and nonobstructed.

Musculoskeletal: Prior sternotomy. No acute osseous abnormality
identified.

Review of the MIP images confirms the above findings.
IMPRESSION: 1. Complex Aortic Dissection with previously repaired ascending
aorta and aortic valve replacement.
Dilated aortic root up to 51 mm diameter. Arch dilated up to 46 mm
diameter. Descending aorta dilated up to 54 mm.
Complicated Arch dissection with multiple false and/or partially
thrombosed lumens.
Dissection flaps extend into all great vessels except the Left
carotid, and dissection continues cephalad in the Right CCA beyond
the level of this imaging.
Aortic dissection continues into the abdomen and tracks into the
left renal artery.
But all visible great vessels and major abdominal aortic branches
remain patent.
Additional details above. Aortic Atherosclerosis ([1X]-[1X]).
Aortic aneurysm NOS ([1X]-[1X]).

*Cardiomegaly. No pericardial or pleural effusion. Duplicated SVC
(normal variant).

*Streaky airspace opacity in the right lower lobe more resembles
infection or aspiration than atelectasis.

*Hepatic steatosis.

## 2021-09-28 MED ORDER — IOHEXOL 350 MG/ML SOLN
75.0000 mL | Freq: Once | INTRAVENOUS | Status: AC | PRN
  Administered 2021-09-28: 75 mL via INTRAVENOUS

## 2021-09-28 NOTE — Discharge Instructions (Signed)
Your lab tests and CT scan today were all okay.  Continue taking your medications and follow-up with your doctor this week for further evaluation of your symptoms. ?
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
## 2021-09-28 NOTE — ED Notes (Signed)
Patient transported to CT 

## 2021-09-28 NOTE — ED Notes (Signed)
Pt taken to lobby in wheelchair. Pt has personal phone which is charged. [place] froze up so pt was unable to sign for d/c paperwork.  ?

## 2021-09-28 NOTE — ED Provider Notes (Signed)
Procedures ? ?  ? ?----------------------------------------- ?5:03 AM on 09/28/2021 ?----------------------------------------- ? ?Second troponin flat.  CT angiogram viewed interpreted by me, extensive dissection as previously described.  Radiology report reviewed. ? ?Outside records reviewed including CT angiogram chest abdomen pelvis from August 2022 which describes extensive aortic dissection, showing that findings on today's CT are chronic and stable.   ? ?Work-up is overall reassuring and pain has improved back to patient's baseline chronic pain.  Does not require admission due to reassuring work-up vital signs and improvement of symptoms. ? ? ? ?  ?Sharman Cheek, MD ?09/28/21 [phone] ? ?

## 2021-09-29 ENCOUNTER — Telehealth: Payer: Self-pay

## 2021-09-29 ENCOUNTER — Ambulatory Visit (INDEPENDENT_AMBULATORY_CARE_PROVIDER_SITE_OTHER): Payer: Medicaid Other | Admitting: Nurse Practitioner

## 2021-09-29 ENCOUNTER — Encounter: Payer: Self-pay | Admitting: Nurse Practitioner

## 2021-09-29 VITALS — BP 109/59 | HR 52 | Temp 97.6°F | Ht 68.0 in | Wt 182.0 lb

## 2021-09-29 DIAGNOSIS — I480 Paroxysmal atrial fibrillation: Secondary | ICD-10-CM | POA: Diagnosis not present

## 2021-09-29 DIAGNOSIS — E611 Iron deficiency: Secondary | ICD-10-CM | POA: Insufficient documentation

## 2021-09-29 DIAGNOSIS — I7100 Dissection of unspecified site of aorta: Secondary | ICD-10-CM | POA: Diagnosis not present

## 2021-09-29 DIAGNOSIS — Z136 Encounter for screening for cardiovascular disorders: Secondary | ICD-10-CM

## 2021-09-29 DIAGNOSIS — R7301 Impaired fasting glucose: Secondary | ICD-10-CM | POA: Diagnosis not present

## 2021-09-29 DIAGNOSIS — G459 Transient cerebral ischemic attack, unspecified: Secondary | ICD-10-CM

## 2021-09-29 DIAGNOSIS — I7103 Dissection of thoracoabdominal aorta: Secondary | ICD-10-CM | POA: Diagnosis not present

## 2021-09-29 DIAGNOSIS — N182 Chronic kidney disease, stage 2 (mild): Secondary | ICD-10-CM

## 2021-09-29 DIAGNOSIS — Z7689 Persons encountering health services in other specified circumstances: Secondary | ICD-10-CM

## 2021-09-29 NOTE — Assessment & Plan Note (Signed)
Chronic. Followed by Cardiology, Dr. END. Continue with current medication regimen. Patient has mechanical valve and on Coumadin.  Has an appointment with Cardiac Surgeon soon.  Continue to follow up with Dr. Okey Dupre.  Return in 3 months.  Call sooner if concerns arise.  ?

## 2021-09-29 NOTE — Assessment & Plan Note (Signed)
New problem.  Happened in march 2023.  Unsure of whether he has follow up with Neurology.  Will place referral for patient to get established.  ?

## 2021-09-29 NOTE — Assessment & Plan Note (Signed)
Chronic. Followed by Cardiology, Dr. END. Continue with current medication regimen. Continue to follow up with Dr. End.  Return in 3 months.  Call sooner if concerns arise.  ?

## 2021-09-29 NOTE — Assessment & Plan Note (Signed)
Chronic. Followed by Cardiology, Dr. END. Continue with current medication regimen. Continue to follow up with Dr. Okey Dupre.  Return in 3 months.  Call sooner if concerns arise.  ?

## 2021-09-29 NOTE — Telephone Encounter (Signed)
? ?  Telephone encounter was:  Successful.  ?09/29/2021 ?Name: Ryan [name] MRN: [id number] DOB: 14-Jul-1965 ? ?Ryan [name] is a 56 y.o. year old male who is a primary care patient of Larae Grooms, NP . The community resource team was consulted for assistance with Financial Difficulties related to financial stain ? ?Care guide performed the following interventions: Patient provided with information about care guide support team and interviewed to confirm resource needs.Patient stated  no resources ae needed ? ?Follow Up Plan:  No further follow up planned at this time. The patient has been provided with needed resources. ? ? ? ?Lenard Forth ?Care Guide, Embedded Care Coordination ?[place], Care Management  ?[phone] ?[address] [place], [place], [place] [address] ?Phone: [phone] ?Email: [email or web address] ? ?  ?

## 2021-09-29 NOTE — Assessment & Plan Note (Signed)
Labs ordered today.  Will make recommendations based on lab results. ?

## 2021-09-29 NOTE — Assessment & Plan Note (Signed)
Chronic. Ongoing since 2001 per patient. Continue with iron supplement.  Labs ordered today. Will make recommendations based on labs results.   ?

## 2021-09-30 ENCOUNTER — Ambulatory Visit: Payer: Medicaid Other | Admitting: Nurse Practitioner

## 2021-09-30 ENCOUNTER — Encounter: Payer: Self-pay | Admitting: Nurse Practitioner

## 2021-09-30 VITALS — BP 128/72 | HR 50 | Ht 68.0 in | Wt 183.0 lb

## 2021-09-30 DIAGNOSIS — I10 Essential (primary) hypertension: Secondary | ICD-10-CM

## 2021-09-30 DIAGNOSIS — E782 Mixed hyperlipidemia: Secondary | ICD-10-CM

## 2021-09-30 DIAGNOSIS — I7100 Dissection of unspecified site of aorta: Secondary | ICD-10-CM

## 2021-09-30 DIAGNOSIS — R072 Precordial pain: Secondary | ICD-10-CM

## 2021-09-30 DIAGNOSIS — I480 Paroxysmal atrial fibrillation: Secondary | ICD-10-CM

## 2021-09-30 DIAGNOSIS — Z952 Presence of prosthetic heart valve: Secondary | ICD-10-CM

## 2021-09-30 DIAGNOSIS — R778 Other specified abnormalities of plasma proteins: Secondary | ICD-10-CM

## 2021-09-30 LAB — COMPREHENSIVE METABOLIC PANEL
ALT: 14 IU/L (ref 0–44)
AST: 17 IU/L (ref 0–40)
Albumin/Globulin Ratio: 1.7 (ref 1.2–2.2)
Albumin: 3.8 g/dL (ref 3.8–4.9)
Alkaline Phosphatase: 66 IU/L (ref 44–121)
BUN/Creatinine Ratio: 12 (ref 9–20)
BUN: 11 mg/dL (ref 6–24)
Bilirubin Total: 0.3 mg/dL (ref 0.0–1.2)
CO2: 23 mmol/L (ref 20–29)
Calcium: 8.3 mg/dL — ABNORMAL LOW (ref 8.7–10.2)
Chloride: 106 mmol/L (ref 96–106)
Creatinine, Ser: 0.95 mg/dL (ref 0.76–1.27)
Globulin, Total: 2.2 g/dL (ref 1.5–4.5)
Glucose: 111 mg/dL — ABNORMAL HIGH (ref 70–99)
Potassium: 3.6 mmol/L (ref 3.5–5.2)
Sodium: 142 mmol/L (ref 134–144)
Total Protein: 6 g/dL (ref 6.0–8.5)
eGFR: 95 mL/min/{1.73_m2} (ref >59)

## 2021-09-30 LAB — CBC WITH DIFFERENTIAL/PLATELET
Basophils Absolute: 0 10*3/uL (ref 0.0–0.2)
Basos: 0 %
EOS (ABSOLUTE): 0.1 10*3/uL (ref 0.0–0.4)
Eos: 2 %
Hematocrit: 38.6 % (ref 37.5–51.0)
Hemoglobin: 12.6 g/dL — ABNORMAL LOW (ref 13.0–17.7)
Immature Grans (Abs): 0.1 10*3/uL (ref 0.0–0.1)
Immature Granulocytes: 1 %
Lymphocytes Absolute: 1.5 10*3/uL (ref 0.7–3.1)
Lymphs: 29 %
MCH: 31.8 pg (ref 26.6–33.0)
MCHC: 32.6 g/dL (ref 31.5–35.7)
MCV: 98 fL — ABNORMAL HIGH (ref 79–97)
Monocytes Absolute: 0.3 10*3/uL (ref 0.1–0.9)
Monocytes: 6 %
Neutrophils Absolute: 3.3 10*3/uL (ref 1.4–7.0)
Neutrophils: 62 %
Platelets: 154 10*3/uL (ref 150–450)
RBC: 3.96 x10E6/uL — ABNORMAL LOW (ref 4.14–5.80)
RDW: 14.3 % (ref 11.6–15.4)
WBC: 5.4 10*3/uL (ref 3.4–10.8)

## 2021-09-30 LAB — HEMOGLOBIN A1C
Est. average glucose Bld gHb Est-mCnc: 114 mg/dL
Hgb A1c MFr Bld: 5.6 % (ref 4.8–5.6)

## 2021-09-30 LAB — LIPID PANEL
Chol/HDL Ratio: 2.9 ratio (ref 0.0–5.0)
Cholesterol, Total: 81 mg/dL — ABNORMAL LOW (ref 100–199)
HDL: 28 mg/dL — ABNORMAL LOW (ref >39)
LDL Chol Calc (NIH): 18 mg/dL (ref 0–99)
Triglycerides: 232 mg/dL — ABNORMAL HIGH (ref 0–149)
VLDL Cholesterol Cal: 35 mg/dL (ref 5–40)

## 2021-09-30 NOTE — Progress Notes (Signed)
? ? ?Office Visit  ?  ?Patient Name: Ryan [name] ?Date of Encounter: 09/30/2021 ? ?Primary Care Provider:  Jon Billings, NP ?Primary Cardiologist:  None ? ?Chief Complaint  ?  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 ? ?Past Medical History  ?  ?Past Medical History:  ?Diagnosis Date  ? Anemia   ? Aortic aneurysm and dissection ([place])   ? a. 2001 s/p grafting and AVR; b. 09/2021 CTA Chest: Dil Ao root up to 60mm. Ao arch 59mm. Desc Ao 77mm. Complicated arch dissection w/ mult false and/or partially thrombosed lumens. Dissection flaps extend into all great vessels except L carotid. Dissection cont into R CCA and into abd->L RA.  ? Chronic anticoagulation   ? Dissecting aortic aneurysm, thoracoabdominal ([place])   ? Fatty liver   ? GERD (gastroesophageal reflux disease)   ? H/O mechanical aortic valve replacement   ? a. 2001 s/p AVR in setting of Asc Ao aneurysm repair; b. 08/2021 Echo: EF 50-55%, no rwma, mild LVH, GrII DD, nl RV fxn, mild MR, triv AI w/ mean AoV grad 56mmHg.  ? Heart murmur   ? History of cardiac catheterization   ? a. 08/2019 MV: basal-dist lateral and inflat defect; b. 07/2020 Cath: nl cors.  ? Hyperlipidemia   ? Hypertension   ? Hypolipidemia   ? Iron deficiency   ? PAF (paroxysmal atrial fibrillation) ([place])   ? a. CHA2DS2VASc = 3.  Chronic warfarin (also mech AVR).  ? Pulmonary nodule   ? Sleep apnea   ? Stroke [facility])   ? ?Past Surgical History:  ?Procedure Laterality Date  ? ABDOMINAL SURGERY    ? AORTIC VALVE REPLACEMENT  2001  ? St Jude Mechanical  ? ARTHROSCOPIC REPAIR ACL    ? COLONOSCOPY  2019  ? MENISCUS REPAIR    ? ? ?Allergies ? ?Allergies   ?Allergen Reactions  ? Alitraq Rash  ? Iodinated Contrast Media Rash  ? Iodine Rash  ?  CT dye ?CT dye ? ?CT dye ?CT dye  ? ? ?History of Present Illness  ?  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  He has chronic, daily chest pain, dating back to his aortic dissection repair and AVR in 2001.  Stress testing in March 2021 suggested basal-distal lateral and inferolateral defect.  Diagnostic catheterization performed in [place] in February 2022 showed normal coronary arteries.  He recently moved to [place] from [place] and establish care with Dr. Saunders Revel in early March.  Approximately 10 days after his most recent cardiology office visit, he was admitted to [facility] for transient right upper extremity numbness without acute findings on head CT or brain MRI.  Symptoms were felt to be most consistent with TIA.  An echocardiogram was performed and showed an EF of 50-55%, mild LVH, grade 2 diastolic dysfunction, mild MR, and trivial AI.  More recently, he was seen in the emergency department between April 15 and 16 due to chest pain associated with  dyspnea that worsened while he was at work.  He says that when he awoke this morning, he just felt "off."  He noted some visual disturbance while at work with tunnel vision, and just felt uneasy and then noted an increase in baseline level of chest pain.  Troponin was minimally elevated with a flat trend at 28  27.  CTA of the chest continued to show dilated thoracic aorta with complicated arch dissection and multiple false and/or partially thrombosed lumens as described above in the past medical history.  Overall,  CT findings were felt to be stable from CT performed in [place] in November 2022, and patient was discharged home. ? ?Since recent ED visit, Ryan [name] has felt well.  He does note some degree of dyspnea on exertion but also notes that he has been much less active, eating more, and gaining weight.  His weight is up 7 pounds on our scale since his last visit in March.  He has constant, 4/10 chest pain, which he notes is being tolerable.  He works at [employer] but tries to limit his fast food intake.  That said, he does frequently snack on the onion rings.  He denies palpitations, PND, orthopnea, dizziness, syncope, edema, or early satiety. ? ?Home Medications  ?  ?Current Outpatient Medications  ?Medication Sig Dispense Refill  ? acetaminophen (TYLENOL) 500 MG tablet Take 500 mg by mouth every 6 (six) hours as needed.    ? aspirin 81 MG EC tablet Take 81 mg by mouth daily.    ? Ferrous Sulfate Dried (HIGH POTENCY IRON) 65 MG TABS Take 650 mg by mouth daily.    ? lisinopril (ZESTRIL) 10 MG tablet Take 1 tablet (10 mg total) by mouth daily. 90 tablet 1  ? lovastatin (MEVACOR) 20 MG tablet Take 20 mg by mouth at bedtime.    ? Potassium 99 MG TABS Take 99 mg by mouth every evening.    ? warfarin (COUMADIN) 5 MG tablet Take 0.5-1 tablets (2.5-5 mg total) by mouth daily at 4 PM. Take 5 mg Monday, Weds, Sat. 2.5 mg on all other days 30 tablet 1  ? ?No current facility-administered medications for this visit.  ?  ? ?Review of Systems  ?  ?Constant 4/10 chest pain with occasional worsening.  Some increase in baseline level of dyspnea on exertion since moving to [place], which she associates with inactivity and weight gain.  He denies palpitations, PND, orthopnea, dizziness, syncope, edema, or early satiety.  All other systems reviewed and are otherwise negative except as noted above. ?  ? ?Physical Exam  ?  ?VS:  BP 128/72 (BP Location: Left Arm, Patient Position: Sitting, Cuff Size: Normal)   Pulse (!) 50   Ht  5\' 8"  (1.727 m)   Wt 183 lb (83 kg)   SpO2 99%   BMI 27.83 kg/m?  , BMI Body mass index is 27.83 kg/m?. ?    ?GEN: Well nourished, well developed, in no acute distress. ?HEENT: normal. ?Neck: Supple, no JVD, carotid bruits, or masses. ?Cardiac: RRR, 2/6 systolic murmur at the upper sternal borders with mechanical S2.  No rubs or gallops.  No clubbing, cyanosis, edema.  Radials/PT 2+ and equal bilaterally.  ?Respiratory:  Respirations regular and unlabored, clear to auscultation bilaterally. ?GI: Soft, nontender, nondistended, BS + x 4. ?MS: no deformity or atrophy. ?Skin: warm and dry, no rash. ?Neuro:  Strength and sensation are intact. ?Psych: Normal affect. ? ?Accessory Clinical Findings  ?  ?  ECG personally reviewed by me today -sinus bradycardia, 50, left bundle branch block- no acute changes. ? ?Lab Results  ?Component Value Date  ? WBC 5.4 09/29/2021  ? HGB 12.6 (L) 09/29/2021  ? HCT 38.6 09/29/2021  ? MCV 98 (H) 09/29/2021  ? PLT 154 09/29/2021  ? ?Lab Results  ?Component Value Date  ? CREATININE 0.95 09/29/2021  ? BUN 11 09/29/2021  ? NA 142 09/29/2021  ? K 3.6 09/29/2021  ? CL 106 09/29/2021  ? CO2 23 09/29/2021  ? ?Lab Results  ?Component Value Date  ? ALT 14 09/29/2021  ? AST 17 09/29/2021  ? ALKPHOS 66 09/29/2021  ? BILITOT 0.3 09/29/2021  ? ?Lab Results  ?Component Value Date  ? CHOL 81 (L) 09/29/2021  ? HDL 28 (L) 09/29/2021  ? [place] 18 09/29/2021  ? TRIG 232 (H) 09/29/2021  ? CHOLHDL 2.9 09/29/2021  ?  ?Lab Results  ?Component Value Date  ? HGBA1C 5.6 09/29/2021  ? ?Lab Results  ?Component Value Date  ? INR 1.9 (H) 09/27/2021  ? INR 3.6 (A) 09/17/2021  ? INR 2.9 (H) 09/06/2021  ?  ?Assessment & Plan  ?  ?1.  Chronic chest pain/elevated troponin: Patient with a history of constant 4/10 chest pain dating back to his aneurysm repair and aortic valve replacement in 2001.  He has had multiple cardiac evaluations over the years with stress testing in March 2001 suggesting basal to distal lateral and  inferolateral defect while diagnostic catheterization in February 2022 showed normal coronary arteries.  He was recently seen in the emergency department with acute on chronic worsening of chest discomfort.  Troponin

## 2021-09-30 NOTE — Progress Notes (Signed)
Hi Ryan [name].  It was nice to meet you yesterday.  Your lab work looks good.  Your triglycerides are elevated.  I recommend decreasing your processed food and refined sugar intake.  Your hemoglobin and hematocrit has improved from prior. Continue with your iron supplement.  I will see you at our next visit.  Please let me know if you have any questions.

## 2021-09-30 NOTE — Patient Instructions (Signed)
Medication Instructions:  ?Your physician has recommended you make the following change in your medication:  ? ?TAKE WHOLE TABLET Coumadin 5 mg today only. ? ?*If you need a refill on your cardiac medications before your next appointment, please call your pharmacy* ? ? ?Lab Work: ?None ? ?If you have labs (blood work) drawn today and your tests are completely normal, you will receive your results only by: ?MyChart Message (if you have MyChart) OR ?A paper copy in the mail ?If you have any lab test that is abnormal or we need to change your treatment, we will call you to review the results. ? ? ?Testing/Procedures: ?None ? ? ?Follow-Up: ?At [facility], you and your health needs are our priority.  As part of our continuing mission to provide you with exceptional heart care, we have created designated Provider Care Teams.  These Care Teams include your primary Cardiologist (physician) and Advanced Practice Providers (APPs -  Physician Assistants and Nurse Practitioners) who all work together to provide you with the care you need, when you need it. ? ? ?Your next appointment:   ?2-3 month(s) ? ?The format for your next appointment:   ?In Person ? ?Provider:   ?Nelva Bush, MD or Murray Hodgkins, NP  ? ? ? ? ?Important Information About Sugar ? ? ? ? ?  ?

## 2021-10-08 ENCOUNTER — Ambulatory Visit (INDEPENDENT_AMBULATORY_CARE_PROVIDER_SITE_OTHER): Payer: Medicaid Other

## 2021-10-08 DIAGNOSIS — Z7901 Long term (current) use of anticoagulants: Secondary | ICD-10-CM

## 2021-10-08 DIAGNOSIS — Z5181 Encounter for therapeutic drug level monitoring: Secondary | ICD-10-CM | POA: Diagnosis not present

## 2021-10-08 DIAGNOSIS — Z952 Presence of prosthetic heart valve: Secondary | ICD-10-CM | POA: Diagnosis not present

## 2021-10-08 LAB — POCT INR: INR: 2.5 (ref 2.0–3.0)

## 2021-10-08 NOTE — Patient Instructions (Signed)
-   continue 1/2 tablet every day EXCEPT 1 whole tablet on MONDAYS, WEDNESDAYS & SATURDAYS. - Recheck INR in 6 weeks 

## 2021-10-15 ENCOUNTER — Institutional Professional Consult (permissible substitution): Payer: Medicaid Other | Admitting: Surgery

## 2021-10-15 ENCOUNTER — Encounter: Payer: Self-pay | Admitting: Surgery

## 2021-10-15 VITALS — BP 124/63 | HR 50 | Resp 20 | Ht 68.0 in | Wt 183.0 lb

## 2021-10-15 DIAGNOSIS — I7100 Dissection of unspecified site of aorta: Secondary | ICD-10-CM

## 2021-10-15 NOTE — Progress Notes (Signed)
? ?Cardiothoracic Surgery Consultation ? ?PCP is Larae Grooms, NP ?Referring Provider is End, Cristal Deer, MD ? ?Chief Complaint  ?Patient presents with  ? Thoracic Aortic Dissection  ?  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 ? ?HPI: ? ?The patient is a 56 year old gentleman with history of hypertension, hyperlipidemia, paroxysmal atrial fibrillation, obstructive sleep apnea, previous stroke, who underwent replacement of his ascending aorta and aortic valve replacement using a mechanical valve for an acute type a dissection in 2001 at [facility] in [place] by Dr. Rigoberto Noel.  He reports an uncomplicated postoperative course.  He has been maintained on Coumadin.  He has been followed there since until he recently moved to [place].  Review of CT scan reports on Care Everywhere shows that the patient had a residual dissection flap beyond the repair extending up into the innominate and right common carotid arteries as well as left subclavian artery.  His most recent CTA of the chest and abdomen on 04/25/2021 at [facility] network showed redemonstration of a residual dissection in the aortic arch through the descending and abdominal aorta and extending into bilateral common iliac arteries.  There was stable dilation of the aortic root measuring 5 cm.  There is aneurysmal dilation of the arch to 4.7 cm and descending thoracic aorta to 4.8 cm which was stable.  The dissection flap continued into the innominate and right common carotid artery as well as the left subclavian artery without change.  The celiac and SMA arise from the true lumen.  The dissection involve the origin of the renal arteries which enhance normally.  The IMA enhances arising from the true lumen.  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 ? ?The patient reports chronic chest pain that has been present since his  surgery.  He has chronic shortness of breath.  He continues to work for [employer]. ?Past Medical History:  ?Diagnosis Date  ? Anemia   ? Aortic aneurysm and dissection (HCC)   ? a. 2001 s/p grafting and AVR; b. 09/2021 CTA Chest: Dil Ao root up to 27mm. Ao arch 46mm. Desc Ao 52mm. Complicated arch dissection w/ mult false and/or partially thrombosed lumens. Dissection flaps extend into all great vessels except L carotid. Dissection cont into R CCA and into abd->L RA.  ? Chronic anticoagulation   ? Dissecting aortic aneurysm, thoracoabdominal (HCC)   ? Fatty liver   ? GERD (gastroesophageal reflux disease)   ? H/O mechanical aortic valve replacement   ? a. 2001 s/p AVR in setting of Asc Ao aneurysm repair; b. 08/2021 Echo: EF 50-55%, no rwma, mild LVH, GrII DD, nl RV fxn, mild MR, triv AI w/ mean AoV grad .  ? Heart murmur   ? History of cardiac catheterization   ? a. 08/2019 MV: basal-dist lateral and inflat defect; b. 07/2020 Cath: nl cors.  ? Hyperlipidemia   ? Hypertension   ? Hypolipidemia   ? Iron deficiency   ? PAF (paroxysmal atrial fibrillation) (HCC)   ? a. CHA2DS2VASc = 3.  Chronic warfarin (also mech AVR).  ? Pulmonary nodule   ? Sleep apnea   ? Stroke [facility])   ? ? ?Past Surgical History:  ?Procedure Laterality Date  ? ABDOMINAL SURGERY    ? AORTIC VALVE REPLACEMENT  2001  ? [employer] Jude Mechanical  ? ARTHROSCOPIC REPAIR ACL    ? COLONOSCOPY  2019  ? MENISCUS REPAIR    ? ? ?Family History  ?Problem Relation Age of Onset  ? Heart attack Mother   ? Hypertension Mother   ? Hyperlipidemia Mother   ? Heart disease Mother   ? Heart attack Maternal Grandfather   ? ? ?Social History ?Social History  ? ?Tobacco Use  ? Smoking status: Never  ?  Passive exposure: Past  ? Smokeless tobacco: Never  ?Vaping Use  ? Vaping Use: Never used  ?Substance Use Topics  ? Alcohol use: Yes  ?  Comment: 2 mixed drinks per year  ? Drug use: Never  ? ? ?Current Outpatient Medications  ?Medication Sig Dispense Refill  ? acetaminophen  (TYLENOL) 500 MG tablet Take 500 mg by mouth every 6 (six) hours as needed.    ? aspirin 81 MG EC tablet Take 81 mg by mouth daily.    ? Ferrous Sulfate Dried (HIGH POTENCY IRON) 65 MG TABS Take 650 mg by mouth daily.    ? lisinopril (ZESTRIL) 10 MG tablet Take 1 tablet (10 mg total) by mouth daily. 90 tablet 1  ? lovastatin (MEVACOR) 20 MG tablet Take 20 mg by mouth at bedtime.    ? Potassium 99 MG TABS Take 99 mg by mouth every evening.    ? warfarin (COUMADIN) 5 MG tablet Take 0.5-1 tablets (2.5-5 mg total) by mouth daily at 4 PM. Take 5 mg Monday, Weds, Sat. 2.5 mg on all other days 30 tablet 1  ? ?No current facility-administered medications for this visit.  ? ? ?Allergies  ?Allergen Reactions  ? Alitraq Rash  ? Iodinated Contrast Media Rash  ? Iodine Rash  ?  CT dye ?CT dye ? ?CT dye ?CT dye  ? ? ?Review of Systems  ?Constitutional:  Negative for activity change and fatigue.  ?HENT: Negative.    ?Eyes:   ?     Blurry vision and floaters.  ?Respiratory:  Positive for shortness of breath.   ?Cardiovascular:  Positive for chest pain and leg swelling.  ?Gastrointestinal:  Positive for abdominal pain.  ?     History of black stools  ?Endocrine: Negative.   ?Genitourinary:  Positive for frequency.  ?Musculoskeletal:  Positive for myalgias.  ?Skin: Negative.   ?Allergic/Immunologic: Negative.   ?Neurological:  Positive for numbness and headaches. Negative for dizziness and syncope.  ?Hematological:  Bruises/bleeds easily.  ?Psychiatric/Behavioral:    ?     Anxiety and depression.  ? ?BP 124/63   Pulse (!) 50   Resp 20   Ht 5\' 8"  (1.727 m)   Wt 183 lb (83 kg)   SpO2 97% Comment: RA  BMI 27.83 kg/m?  ?Physical Exam ?Constitutional:   ?   Appearance: Normal appearance. He is normal weight.  ?HENT:  ?   Head: Normocephalic and atraumatic.  ?Eyes:  ?   Extraocular Movements: Extraocular movements intact.  ?   Conjunctiva/sclera: Conjunctivae normal.  ?   Pupils: Pupils are equal, round, and reactive to light.   ?Neck:  ?   Vascular: No carotid bruit.  ?Cardiovascular:  ?   Rate and Rhythm: Normal rate and regular rhythm.  ?   Pulses: Normal pulses.  ?   Heart sounds: Murmur heard.  ?   Comments: 2/6 systolic murmur along the right sternal border.  There is no diastolic murmur.  Crisp mechanical valve click. ?Pulmonary:  ?   Effort: Pulmonary effort is normal.  ?   Breath sounds: Normal breath sounds.  ?  Abdominal:  ?   General: Abdomen is flat. There is no distension.  ?   Palpations: Abdomen is soft.  ?   Tenderness: There is no abdominal tenderness.  ?Musculoskeletal:     ?   General: No swelling. Normal range of motion.  ?   Cervical back: Neck supple.  ?Skin: ?   General: Skin is warm and dry.  ?Neurological:  ?   General: No focal deficit present.  ?   Mental Status: He is alert and oriented to person, place, and time.  ?Psychiatric:     ?   Mood and Affect: Mood normal.     ?   Behavior: Behavior normal.  ? ? ? ?Diagnostic Tests: ? ?Narrative & Impression  ?CLINICAL DATA:  56 year old male with history of aortic dissection. ?Chest pain. ?  ?EXAM: ?CT ANGIOGRAPHY CHEST WITH CONTRAST ?  ?TECHNIQUE: ?Multi detector noncontrast chest images 1st acquired. ?  ?Multidetector CT imaging of the chest was performed using the ?standard protocol during bolus administration of intravenous ?contrast. Multiplanar CT image reconstructions and MIPs were ?obtained to evaluate the vascular anatomy. ?  ?RADIATION DOSE REDUCTION: This exam was performed according to the ?departmental dose-optimization program which includes automated ?exposure control, adjustment of the mA and/or kV according to ?patient size and/or use of iterative reconstruction technique. ?  ?CONTRAST:  14mL OMNIPAQUE IOHEXOL 350 MG/ML SOLN ?  ?COMPARISON:  Portable chest 09/27/2021. ?  ?FINDINGS: ?Cardiovascular:  This study designed for aortic contrast timing. ?  ?Previous sternotomy and aortic valve replacement. Globular dilated ?aortic root measures up to 51 mm  diameter. Evidence of surgically ?repaired ascending aorta distal to the root. ?  ?Complex aortic dissection begins in the arch at the brachiocephalic ?origin. The true lumen is that depicted with a rose on series 8,

## 2021-10-20 ENCOUNTER — Other Ambulatory Visit: Payer: Self-pay | Admitting: Nurse Practitioner

## 2021-10-20 MED ORDER — LOVASTATIN 20 MG PO TABS: 20.0000 mg | ORAL_TABLET | Freq: Every day | ORAL | 1 refills | Status: DC

## 2021-11-19 ENCOUNTER — Telehealth: Payer: Self-pay

## 2021-11-19 NOTE — Telephone Encounter (Signed)
Tried calling patient to reschedule INR check.  No VM set up.

## 2021-11-29 ENCOUNTER — Emergency Department: Payer: Medicaid Other

## 2021-11-29 ENCOUNTER — Other Ambulatory Visit: Payer: Self-pay

## 2021-11-29 ENCOUNTER — Emergency Department
Admission: EM | Admit: 2021-11-29 | Discharge: 2021-11-30 | Disposition: A | Discharge status: Home / Self Care | Payer: Medicaid Other | Attending: Emergency Medicine | Admitting: Emergency Medicine

## 2021-11-29 DIAGNOSIS — I447 Left bundle-branch block, unspecified: Secondary | ICD-10-CM | POA: Insufficient documentation

## 2021-11-29 DIAGNOSIS — R079 Chest pain, unspecified: Secondary | ICD-10-CM

## 2021-11-29 DIAGNOSIS — N189 Chronic kidney disease, unspecified: Secondary | ICD-10-CM | POA: Insufficient documentation

## 2021-11-29 DIAGNOSIS — R0789 Other chest pain: Secondary | ICD-10-CM | POA: Insufficient documentation

## 2021-11-29 LAB — CBC WITH DIFFERENTIAL/PLATELET
Abs Immature Granulocytes: 0.05 10*3/uL (ref 0.00–0.07)
Basophils Absolute: 0 10*3/uL (ref 0.0–0.1)
Basophils Relative: 1 %
Eosinophils Absolute: 0.1 10*3/uL (ref 0.0–0.5)
Eosinophils Relative: 2 %
HCT: 36.2 % — ABNORMAL LOW (ref 39.0–52.0)
Hemoglobin: 12.1 g/dL — ABNORMAL LOW (ref 13.0–17.0)
Immature Granulocytes: 1 %
Lymphocytes Relative: 24 %
Lymphs Abs: 1.6 10*3/uL (ref 0.7–4.0)
MCH: 31.3 pg (ref 26.0–34.0)
MCHC: 33.4 g/dL (ref 30.0–36.0)
MCV: 93.8 fL (ref 80.0–100.0)
Monocytes Absolute: 0.5 10*3/uL (ref 0.1–1.0)
Monocytes Relative: 7 %
Neutro Abs: 4.4 10*3/uL (ref 1.7–7.7)
Neutrophils Relative %: 65 %
Platelets: 155 10*3/uL (ref 150–400)
RBC: 3.86 MIL/uL — ABNORMAL LOW (ref 4.22–5.81)
RDW: 14 % (ref 11.5–15.5)
WBC: 6.7 10*3/uL (ref 4.0–10.5)
nRBC: 0 % (ref 0.0–0.2)

## 2021-11-29 LAB — BASIC METABOLIC PANEL
Anion gap: 6 (ref 5–15)
BUN: 17 mg/dL (ref 6–20)
CO2: 27 mmol/L (ref 22–32)
Calcium: 8.8 mg/dL — ABNORMAL LOW (ref 8.9–10.3)
Chloride: 110 mmol/L (ref 98–111)
Creatinine, Ser: 1.26 mg/dL — ABNORMAL HIGH (ref 0.61–1.24)
GFR, Estimated: >60 mL/min (ref >60)
Glucose, Bld: 99 mg/dL (ref 70–99)
Potassium: 3.7 mmol/L (ref 3.5–5.1)
Sodium: 143 mmol/L (ref 135–145)

## 2021-11-29 LAB — TROPONIN I (HIGH SENSITIVITY): Troponin I (High Sensitivity): 44 ng/L — ABNORMAL HIGH (ref <18)

## 2021-11-29 IMAGING — DX DG CHEST 1V PORT
1 series · 1 of 1 positions shown · non-contrast
Comparison: [DATE]

CLINICAL DATA: Left chest pain and shortness of breath.

EXAM:
PORTABLE CHEST 1 VIEW

[chest ap]
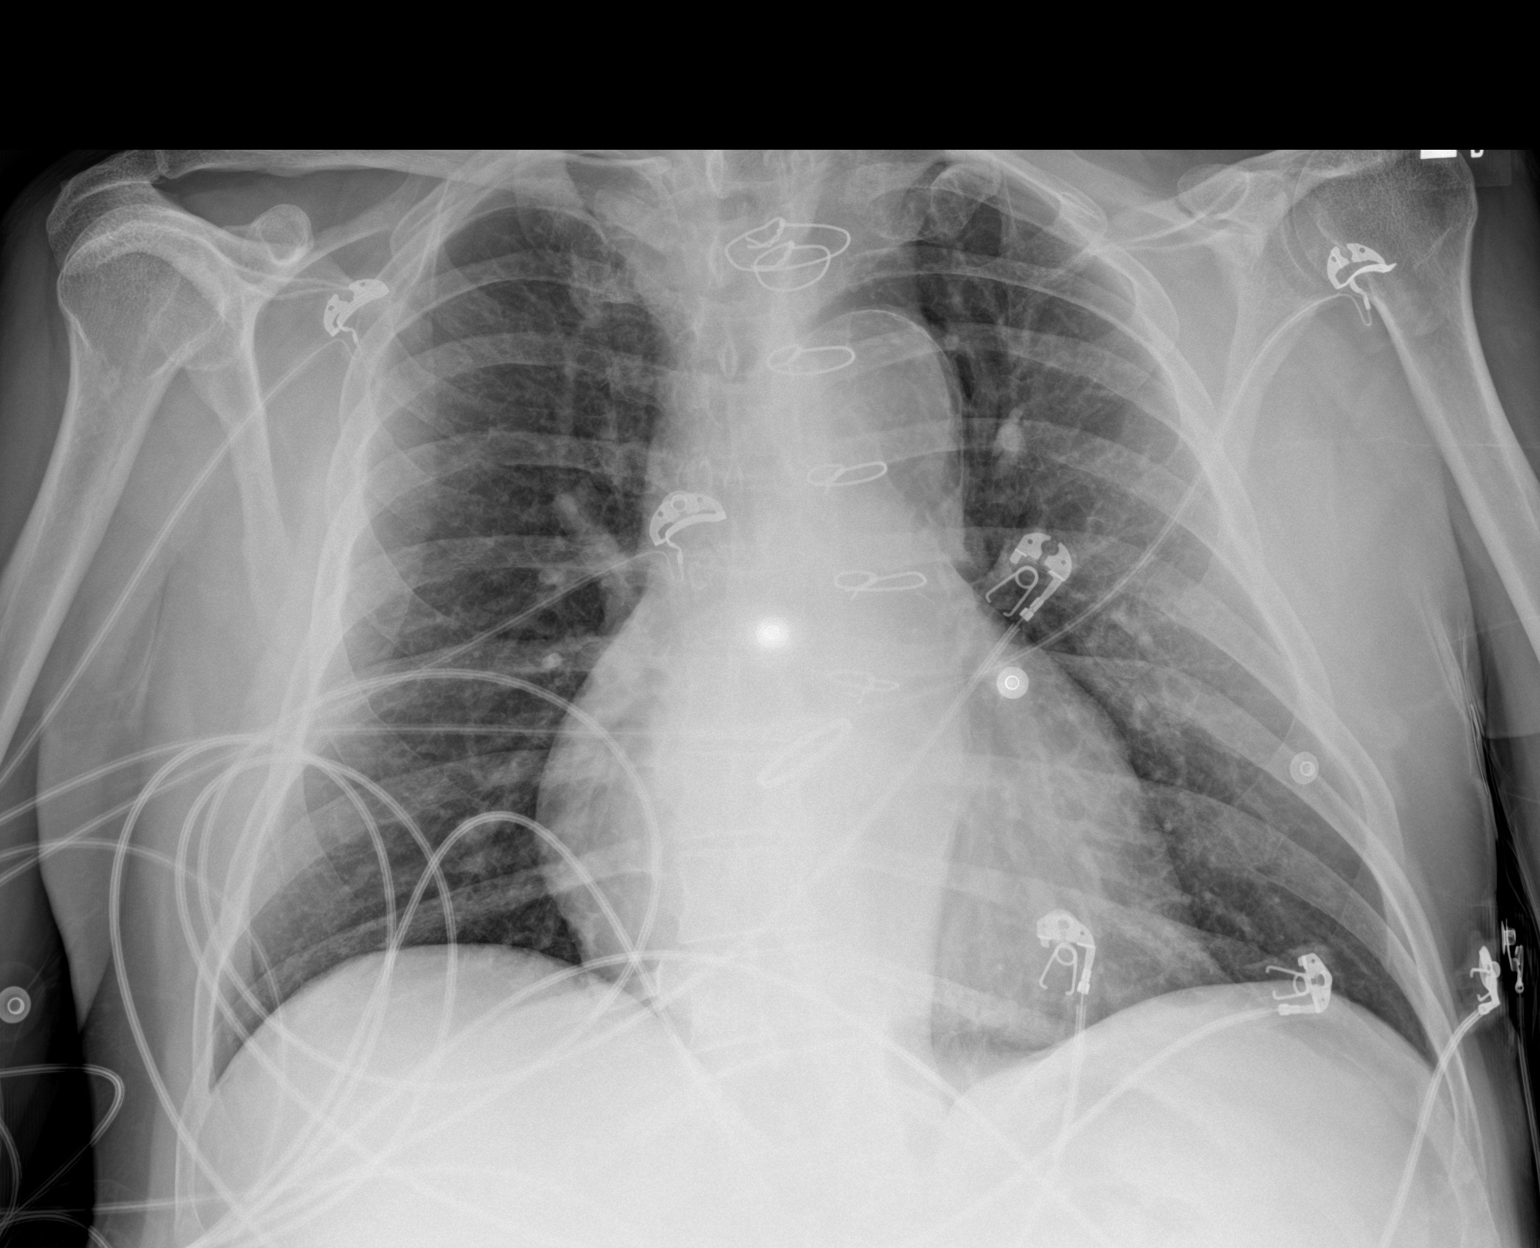

[1 of 1 positions shown; findings below may reference images not displayed]

FINDINGS: Postoperative changes in the mediastinum. Heart size and pulmonary
vascularity are normal. Lungs are clear. No pleural effusions. No
pneumothorax. Mediastinal contours appear intact. Calcification of
the aorta. Degenerative changes in the spine and shoulders.
IMPRESSION: No active disease.

## 2021-11-29 MED ORDER — ASPIRIN 81 MG PO CHEW
324.0000 mg | CHEWABLE_TABLET | Freq: Once | ORAL | Status: AC
  Administered 2021-11-29: 324 mg via ORAL
  Filled 2021-11-29: qty 4

## 2021-11-29 NOTE — ED Provider Notes (Signed)
[facility] Provider Note    Event Date/Time   First MD Initiated Contact with Patient 11/29/21 2131     (approximate)   History   Chief Complaint: Chest Pain   HPI  Ryan [name] is a 56 y.o. male with a past history of TIA, CKD, paroxysmal atrial fibrillation, aortic dissection who comes ED complaining of chest pain that woke him up from sleep at about 8:00 PM tonight.  Pain is in left lateral chest, felt like tightness, nonradiating.  No shortness of breath diaphoresis or vomiting.  No exertional or pleuritic symptoms.  Lasted about 5 minutes and then resolved spontaneously.  He has not had any symptoms since first responders arrived, currently back to baseline and asymptomatic on arrival to the ED.  Denies any recent illness, eating and drinking normally.     Physical Exam   Triage Vital Signs: ED Triage Vitals  Enc Vitals Group     BP 11/29/21 2135 125/69     Pulse Rate 11/29/21 2135 74     Resp 11/29/21 2135 (!) 22     Temp 11/29/21 2135 97.8 F (36.6 C)     Temp Source 11/29/21 2135 Oral     SpO2 11/29/21 2132 96 %     Weight 11/29/21 2136 173 lb (78.5 kg)     Height 11/29/21 2136 5\' 8"  (1.727 m)     Head Circumference --      Peak Flow --      Pain Score 11/29/21 2135 2     Pain Loc --      Pain Edu? --      Excl. in GC? --     Most recent vital signs: Vitals:   11/29/21 2200 11/29/21 2230  BP: (!) 116/59 (!) 106/57  Pulse: 66 63  Resp:  19  Temp:    SpO2: 95% 95%    General: Awake, no distress.  CV:  Good peripheral perfusion.  Regular rate and rhythm.  Symmetric peripheral pulses Resp:  Normal effort.  Clear to auscultation bilaterally Abd:  No distention.  Soft nontender Other:  Moist mucosa.  Chest wall nontender   ED Results / Procedures / Treatments   Labs (all labs ordered are listed, but only abnormal results are displayed) Labs Reviewed  BASIC METABOLIC PANEL - Abnormal; Notable for the following components:       Result Value   Creatinine, Ser 1.26 (*)    Calcium 8.8 (*)    All other components within normal limits  CBC WITH DIFFERENTIAL/PLATELET - Abnormal; Notable for the following components:   RBC 3.86 (*)    Hemoglobin 12.1 (*)    HCT 36.2 (*)    All other components within normal limits  TROPONIN I (HIGH SENSITIVITY) - Abnormal; Notable for the following components:   Troponin I (High Sensitivity) 44 (*)    All other components within normal limits  TROPONIN I (HIGH SENSITIVITY)     EKG Interpreted by me Sinus rhythm, rate of 78.  Normal axis.  Left bundle branch block.  No acute ischemic changes.  There is slight septal ST depression.  Compared to previous EKG on September 30, 2021, no significant change.   RADIOLOGY Chest x-ray interpreted by me, negative for consolidation or pneumothorax.  Radiology report reviewed   PROCEDURES:  Procedures   MEDICATIONS ORDERED IN ED: Medications  aspirin chewable tablet 324 mg (324 mg Oral Given 11/29/21 2143)     IMPRESSION / MDM / ASSESSMENT  AND PLAN / ED COURSE  I reviewed the triage vital signs and the nursing notes.                              Differential diagnosis includes, but is not limited to, musculoskeletal pain, GERD, non-STEMI, pulmonary edema, pleural effusion, pneumothorax  Patient's presentation is most consistent with acute presentation with potential threat to life or bodily function.  Patient with multiple comorbidities comes the ED complaining of nonspecific chest pain.  He had 1 single brief episode which resolved spontaneously.  Patient given aspirin.  No acute EKG changes.  Chest x-ray unremarkable.  Initial serum labs overall unremarkable, high-sensitivity troponin of 44 which is compatible with his chronic baseline of mildly elevated troponin.   Considering the patient's symptoms, medical history, and physical examination today, I have low suspicion for ACS, PE, TAD, pneumothorax, carditis, mediastinitis,  pneumonia, CHF, or sepsis.   We will trend with repeat troponin, if steady I think patient can be discharged home to follow-up with his doctor.          FINAL CLINICAL IMPRESSION(S) / ED DIAGNOSES   Final diagnoses:  Nonspecific chest pain     Rx / DC Orders   ED Discharge Orders     None        Note:  This document was prepared using Dragon voice recognition software and may include unintentional dictation errors.   Sharman Cheek, MD 11/29/21 2321

## 2021-11-29 NOTE — Discharge Instructions (Addendum)

## 2021-11-29 NOTE — ED Triage Notes (Signed)
Pt arrives via [facility] from home with CC of CP that woke pt up from sleep. Denies SOB at this time.

## 2021-11-30 LAB — PROTIME-INR
INR: 2.2 — ABNORMAL HIGH (ref 0.8–1.2)
Prothrombin Time: 24.2 seconds — ABNORMAL HIGH (ref 11.4–15.2)

## 2021-11-30 LAB — TROPONIN I (HIGH SENSITIVITY): Troponin I (High Sensitivity): 42 ng/L — ABNORMAL HIGH (ref <18)

## 2021-11-30 NOTE — ED Provider Notes (Signed)
I was asked by Dr. Scotty Court to follow-up the second troponin and as long as that was stable plan was to discharge patient home.  Repeat troponin is 42 with initial being 44.  Patient remains without any episodes of chest pain for almost 3 hours since arriving to the emergency room.  He does have a cardiologist.   Recommended close follow-up with his cardiologist or return to the hospital if he develops any further episodes of chest pain.  At this time patient is stable for discharge as per plan left by Dr. Darleen Crocker, [place], MD 11/30/21 0020

## 2021-12-09 ENCOUNTER — Encounter: Payer: Self-pay | Admitting: Nurse Practitioner

## 2021-12-09 ENCOUNTER — Other Ambulatory Visit: Payer: Self-pay

## 2021-12-09 ENCOUNTER — Ambulatory Visit (INDEPENDENT_AMBULATORY_CARE_PROVIDER_SITE_OTHER): Payer: Medicaid Other | Admitting: Nurse Practitioner

## 2021-12-09 VITALS — BP 108/56 | HR 58 | Ht 67.0 in | Wt 178.8 lb

## 2021-12-09 DIAGNOSIS — I480 Paroxysmal atrial fibrillation: Secondary | ICD-10-CM

## 2021-12-09 DIAGNOSIS — I7100 Dissection of unspecified site of aorta: Secondary | ICD-10-CM

## 2021-12-09 DIAGNOSIS — E782 Mixed hyperlipidemia: Secondary | ICD-10-CM

## 2021-12-09 DIAGNOSIS — I10 Essential (primary) hypertension: Secondary | ICD-10-CM | POA: Diagnosis not present

## 2021-12-09 DIAGNOSIS — R072 Precordial pain: Secondary | ICD-10-CM

## 2021-12-09 DIAGNOSIS — Z952 Presence of prosthetic heart valve: Secondary | ICD-10-CM | POA: Diagnosis not present

## 2021-12-10 ENCOUNTER — Ambulatory Visit (INDEPENDENT_AMBULATORY_CARE_PROVIDER_SITE_OTHER): Payer: Medicaid Other

## 2021-12-10 ENCOUNTER — Other Ambulatory Visit: Payer: Self-pay

## 2021-12-10 DIAGNOSIS — Z952 Presence of prosthetic heart valve: Secondary | ICD-10-CM | POA: Diagnosis not present

## 2021-12-10 DIAGNOSIS — Z7901 Long term (current) use of anticoagulants: Secondary | ICD-10-CM | POA: Diagnosis not present

## 2021-12-10 DIAGNOSIS — Z5181 Encounter for therapeutic drug level monitoring: Secondary | ICD-10-CM | POA: Diagnosis not present

## 2021-12-10 LAB — POCT INR: INR: 3.1 — AB (ref 2.0–3.0)

## 2021-12-10 MED ORDER — WARFARIN SODIUM 5 MG PO TABS: 2.5000 mg | ORAL_TABLET | Freq: Every day | ORAL | 1 refills | Status: DC

## 2021-12-10 NOTE — Patient Instructions (Signed)
-   continue 1/2 tablet every day EXCEPT 1 whole tablet on MONDAYS, WEDNESDAYS & SATURDAYS. - Recheck INR in 6 weeks

## 2021-12-12 ENCOUNTER — Encounter
Admission: RE | Admit: 2021-12-12 | Discharge: 2021-12-12 | Disposition: A | Discharge status: Home / Self Care | Payer: Medicaid Other | Source: Ambulatory Visit | Attending: Nurse Practitioner | Admitting: Nurse Practitioner

## 2021-12-12 DIAGNOSIS — R072 Precordial pain: Secondary | ICD-10-CM | POA: Insufficient documentation

## 2021-12-12 LAB — NM MYOCAR MULTI W/SPECT W/WALL MOTION / EF
LV dias vol: 188 mL (ref 62–150)
LV sys vol: 94 mL
Nuc Stress EF: 50 %
Peak HR: 72 {beats}/min
Percent HR: 43 %
Rest HR: 52 {beats}/min
Rest Nuclear Isotope Dose: 10.9 mCi
SDS: 2
SRS: 14
SSS: 11
ST Depression (mm): 0 mm
Stress Nuclear Isotope Dose: 30.7 mCi
TID: 1.35

## 2021-12-12 MED ORDER — TECHNETIUM TC 99M TETROFOSMIN IV KIT
30.7200 | PACK | Freq: Once | INTRAVENOUS | Status: AC | PRN
  Administered 2021-12-12: 30.72 via INTRAVENOUS

## 2021-12-12 MED ORDER — REGADENOSON 0.4 MG/5ML IV SOLN
0.4000 mg | Freq: Once | INTRAVENOUS | Status: AC
  Administered 2021-12-12: 0.4 mg via INTRAVENOUS

## 2021-12-12 MED ORDER — TECHNETIUM TC 99M TETROFOSMIN IV KIT
10.9300 | PACK | Freq: Once | INTRAVENOUS | Status: AC | PRN
  Administered 2021-12-12: 10.93 via INTRAVENOUS

## 2021-12-18 NOTE — Progress Notes (Signed)
BP 117/68   Pulse (!) 47   Temp 97.7 F (36.5 C) (Oral)   Wt 176 lb (79.8 kg)   SpO2 97%   BMI 27.57 kg/m    Subjective:    Patient ID: Ryan [name], male    DOB: 15-Sep-1965, 56 y.o.   MRN: [id number]  HPI: Ryan [name] is a 56 y.o. male  Chief Complaint  Patient presents with   Abdominal Pain     Near hernia, ongoing since. Denies nausea, vomiting or diarrhea. States LBM: this AM.    ABDOMINAL ISSUES Duration:  Sunday Nature:  feels like someone is pushing out on his abdomen and bloating Location: peri-umbilical  Severity: 8/10  Radiation: no Episode duration: Frequency: intermittent Alleviating factors: nothing Aggravating factors: nothing Treatments attempted: antacids Constipation: yes Diarrhea: no Episodes of diarrhea/day: Mucous in the stool: no Heartburn: yes Bloating:yes Flatulence: yes Nausea: no Vomiting: no Episodes of vomit/day: Melena or hematochezia: no Rash: no Jaundice: no Fever: no Weight loss: no  Relevant past medical, surgical, family and social history reviewed and updated as indicated. Interim medical history since our last visit reviewed. Allergies and medications reviewed and updated.  Review of Systems  Constitutional:  Negative for fatigue and fever.  Gastrointestinal:  Positive for abdominal pain and constipation. Negative for blood in stool, diarrhea, nausea and vomiting.    Per HPI unless specifically indicated above     Objective:    BP 117/68   Pulse (!) 47   Temp 97.7 F (36.5 C) (Oral)   Wt 176 lb (79.8 kg)   SpO2 97%   BMI 27.57 kg/m   Wt Readings from Last 3 Encounters:  12/19/21 176 lb (79.8 kg)  12/09/21 178 lb 12.8 oz (81.1 kg)  11/29/21 173 lb (78.5 kg)    Physical Exam Vitals and nursing note reviewed.  Constitutional:      General: He is not in acute distress.    Appearance: Normal appearance. He is not ill-appearing, toxic-appearing or diaphoretic.  HENT:     Head: Normocephalic.     Right Ear:  External ear normal.     Left Ear: External ear normal.     Nose: Nose normal. No congestion or rhinorrhea.     Mouth/Throat:     Mouth: Mucous membranes are moist.  Eyes:     General:        Right eye: No discharge.        Left eye: No discharge.     Extraocular Movements: Extraocular movements intact.     Conjunctiva/sclera: Conjunctivae normal.     Pupils: Pupils are equal, round, and reactive to light.  Cardiovascular:     Rate and Rhythm: Normal rate and regular rhythm.     Heart sounds: No murmur heard. Pulmonary:     Effort: Pulmonary effort is normal. No respiratory distress.     Breath sounds: Normal breath sounds. No wheezing, rhonchi or rales.  Abdominal:     General: Abdomen is flat. Bowel sounds are normal.     Tenderness: There is abdominal tenderness in the right upper quadrant and left upper quadrant.  Musculoskeletal:     Cervical back: Normal range of motion and neck supple.  Skin:    General: Skin is warm and dry.     Capillary Refill: Capillary refill takes less than 2 seconds.  Neurological:     General: No focal deficit present.     Mental Status: He is alert and oriented to person, place,  and time.  Psychiatric:        Mood and Affect: Mood normal.        Behavior: Behavior normal.        Thought Content: Thought content normal.        Judgment: Judgment normal.     Results for orders placed or performed during the hospital encounter of 12/12/21  NM Myocar Multi W/Spect W/Wall Motion / EF  Result Value Ref Range   Rest BP 107/78 mmHg   Rest HR 52.0 bpm   Peak HR 72 bpm   Peak BP 110/54 mmHg   Percent HR 43.0 %   ST Depression (mm) 0 mm   Rest Nuclear Isotope Dose 10.9 mCi   Stress Nuclear Isotope Dose 30.7 mCi   SSS 11.0    SRS 14.0    SDS 2.0    TID 1.35    LV sys vol 94.0 mL   LV dias vol 188.0 62 - 150 mL   Nuc Stress EF 50 %      Assessment & Plan:   Problem List Items Addressed This Visit       Other   RUQ pain - Primary     Acute RUQ pain x 5 days.  Having constipation which is unusual for him.  Suspect acute cholecystitis.  Patient does have a hernia as well as Abdominal Aneurysm.  Will order [place] of abdomen to rule out acute cholecystitis.  At 2am patient last had cereal has not had anything since.  Will make further recommendations based on imaging results.       Relevant Orders   US Abdomen Complete     Follow up plan: No follow-ups on file.

## 2021-12-19 ENCOUNTER — Encounter: Payer: Self-pay | Admitting: Emergency Medicine

## 2021-12-19 ENCOUNTER — Other Ambulatory Visit: Payer: Self-pay | Admitting: Nurse Practitioner

## 2021-12-19 ENCOUNTER — Ambulatory Visit: Payer: Self-pay | Admitting: Nurse Practitioner

## 2021-12-19 ENCOUNTER — Inpatient Hospital Stay
Admission: EM | Admit: 2021-12-19 | Discharge: 2021-12-22 | DRG: 446 | Disposition: A | Discharge status: Home / Self Care | Payer: Medicaid Other | Source: Ambulatory Visit | Attending: Internal Medicine | Admitting: Internal Medicine

## 2021-12-19 ENCOUNTER — Ambulatory Visit
Admission: RE | Admit: 2021-12-19 | Discharge: 2021-12-19 | Disposition: A | Discharge status: Home / Self Care | Payer: Medicaid Other | Source: Ambulatory Visit | Attending: Nurse Practitioner | Admitting: Nurse Practitioner

## 2021-12-19 ENCOUNTER — Encounter: Payer: Self-pay | Admitting: Nurse Practitioner

## 2021-12-19 ENCOUNTER — Other Ambulatory Visit: Payer: Self-pay

## 2021-12-19 VITALS — BP 117/68 | HR 47 | Temp 97.7°F | Wt 176.0 lb

## 2021-12-19 DIAGNOSIS — E785 Hyperlipidemia, unspecified: Secondary | ICD-10-CM | POA: Diagnosis present

## 2021-12-19 DIAGNOSIS — I716 Thoracoabdominal aortic aneurysm, without rupture, unspecified: Secondary | ICD-10-CM | POA: Diagnosis present

## 2021-12-19 DIAGNOSIS — I480 Paroxysmal atrial fibrillation: Secondary | ICD-10-CM | POA: Diagnosis present

## 2021-12-19 DIAGNOSIS — H40013 Open angle with borderline findings, low risk, bilateral: Secondary | ICD-10-CM | POA: Diagnosis present

## 2021-12-19 DIAGNOSIS — Z83438 Family history of other disorder of lipoprotein metabolism and other lipidemia: Secondary | ICD-10-CM

## 2021-12-19 DIAGNOSIS — Z8673 Personal history of transient ischemic attack (TIA), and cerebral infarction without residual deficits: Secondary | ICD-10-CM

## 2021-12-19 DIAGNOSIS — Z91041 Radiographic dye allergy status: Secondary | ICD-10-CM

## 2021-12-19 DIAGNOSIS — D6959 Other secondary thrombocytopenia: Secondary | ICD-10-CM | POA: Diagnosis present

## 2021-12-19 DIAGNOSIS — Z7982 Long term (current) use of aspirin: Secondary | ICD-10-CM

## 2021-12-19 DIAGNOSIS — R1011 Right upper quadrant pain: Principal | ICD-10-CM

## 2021-12-19 DIAGNOSIS — R109 Unspecified abdominal pain: Secondary | ICD-10-CM | POA: Diagnosis present

## 2021-12-19 DIAGNOSIS — Z8249 Family history of ischemic heart disease and other diseases of the circulatory system: Secondary | ICD-10-CM

## 2021-12-19 DIAGNOSIS — Z7901 Long term (current) use of anticoagulants: Secondary | ICD-10-CM

## 2021-12-19 DIAGNOSIS — K819 Cholecystitis, unspecified: Secondary | ICD-10-CM

## 2021-12-19 DIAGNOSIS — Z952 Presence of prosthetic heart valve: Secondary | ICD-10-CM

## 2021-12-19 DIAGNOSIS — N182 Chronic kidney disease, stage 2 (mild): Secondary | ICD-10-CM | POA: Diagnosis present

## 2021-12-19 DIAGNOSIS — K8000 Calculus of gallbladder with acute cholecystitis without obstruction: Principal | ICD-10-CM | POA: Diagnosis present

## 2021-12-19 DIAGNOSIS — I7103 Dissection of thoracoabdominal aorta: Secondary | ICD-10-CM | POA: Diagnosis present

## 2021-12-19 DIAGNOSIS — I129 Hypertensive chronic kidney disease with stage 1 through stage 4 chronic kidney disease, or unspecified chronic kidney disease: Secondary | ICD-10-CM | POA: Diagnosis present

## 2021-12-19 DIAGNOSIS — Z79899 Other long term (current) drug therapy: Secondary | ICD-10-CM

## 2021-12-19 LAB — CBC
HCT: 40 % (ref 39.0–52.0)
Hemoglobin: 13.4 g/dL (ref 13.0–17.0)
MCH: 31.4 pg (ref 26.0–34.0)
MCHC: 33.5 g/dL (ref 30.0–36.0)
MCV: 93.7 fL (ref 80.0–100.0)
Platelets: 166 10*3/uL (ref 150–400)
RBC: 4.27 MIL/uL (ref 4.22–5.81)
RDW: 14 % (ref 11.5–15.5)
WBC: 5.8 10*3/uL (ref 4.0–10.5)
nRBC: 0 % (ref 0.0–0.2)

## 2021-12-19 LAB — COMPREHENSIVE METABOLIC PANEL
ALT: 24 U/L (ref 0–44)
AST: 26 U/L (ref 15–41)
Albumin: 4.3 g/dL (ref 3.5–5.0)
Alkaline Phosphatase: 57 U/L (ref 38–126)
Anion gap: 8 (ref 5–15)
BUN: 12 mg/dL (ref 6–20)
CO2: 24 mmol/L (ref 22–32)
Calcium: 8.7 mg/dL — ABNORMAL LOW (ref 8.9–10.3)
Chloride: 106 mmol/L (ref 98–111)
Creatinine, Ser: 1.03 mg/dL (ref 0.61–1.24)
GFR, Estimated: >60 mL/min (ref >60)
Glucose, Bld: 98 mg/dL (ref 70–99)
Potassium: 4 mmol/L (ref 3.5–5.1)
Sodium: 138 mmol/L (ref 135–145)
Total Bilirubin: 1.2 mg/dL (ref 0.3–1.2)
Total Protein: 7.3 g/dL (ref 6.5–8.1)

## 2021-12-19 LAB — URINALYSIS, ROUTINE W REFLEX MICROSCOPIC
Bilirubin Urine: NEGATIVE
Glucose, UA: NEGATIVE mg/dL
Ketones, ur: NEGATIVE mg/dL
Leukocytes,Ua: NEGATIVE
Nitrite: NEGATIVE
Protein, ur: NEGATIVE mg/dL
Specific Gravity, Urine: 1.017 (ref 1.005–1.030)
pH: 5 (ref 5.0–8.0)

## 2021-12-19 LAB — PROTIME-INR
INR: 2.4 — ABNORMAL HIGH (ref 0.8–1.2)
Prothrombin Time: 26.2 seconds — ABNORMAL HIGH (ref 11.4–15.2)

## 2021-12-19 LAB — LIPASE, BLOOD: Lipase: 48 U/L (ref 11–51)

## 2021-12-19 MED ORDER — HYDROCODONE-ACETAMINOPHEN 5-325 MG PO TABS: 1.0000 | ORAL_TABLET | ORAL | Status: DC | PRN

## 2021-12-19 MED ORDER — MORPHINE SULFATE (PF) 2 MG/ML IV SOLN: 2.0000 mg | INTRAVENOUS | Status: DC | PRN

## 2021-12-19 MED ORDER — HYDRALAZINE HCL 20 MG/ML IJ SOLN: 5.0000 mg | INTRAMUSCULAR | Status: DC | PRN

## 2021-12-19 MED ORDER — SODIUM CHLORIDE 0.9 % IV SOLN: INTRAVENOUS | Status: AC

## 2021-12-19 MED ORDER — SODIUM CHLORIDE 0.9% FLUSH
3.0000 mL | Freq: Two times a day (BID) | INTRAVENOUS | Status: DC
  Administered 2021-12-20 – 2021-12-21 (×3): 3 mL via INTRAVENOUS

## 2021-12-19 MED ORDER — ACETAMINOPHEN 325 MG PO TABS: 650.0000 mg | ORAL_TABLET | Freq: Four times a day (QID) | ORAL | Status: DC | PRN

## 2021-12-19 MED ORDER — ACETAMINOPHEN 650 MG RE SUPP: 650.0000 mg | Freq: Four times a day (QID) | RECTAL | Status: DC | PRN

## 2021-12-19 NOTE — Progress Notes (Signed)
Called patient and made him aware of the results.  Recommended patient be seen in the ER for further evaluation and treatment.

## 2021-12-19 NOTE — Assessment & Plan Note (Signed)
Acute RUQ pain x 5 days.  Having constipation which is unusual for him.  Suspect acute cholecystitis.  Patient does have a hernia as well as Abdominal Aneurysm.  Will order [place] of abdomen to rule out acute cholecystitis.  At 2am patient last had cereal has not had anything since.  Will make further recommendations based on imaging results.
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
## 2021-12-19 NOTE — H&P (Signed)
History and Physical    MEHKI KLUMPP ERX:[id number] DOB: [birth year] DOA: 12/19/2021  PCP: Larae Grooms, NP    Patient coming from:  Home    Chief Complaint:  Abd pain    HPI:  Ryan [name] is a 56 y.o. male seen in ed with complaints of abdominal pain: Duration: Since Sunday Frequency: Intermittent Location: Periumbilical/upper abdominal, Quality: Pressure-like with bloating Rate: 8 out of 10 Radiation: Nonradiating Aggravating: None Alleviating: None Associated factors: +Heartburn, +bloating, +flatulence, no nausea vomiting diarrhea fever chills. Patient was seen by primary care physician who ordered right upper quadrant ultrasound for suspicion of acute cholecystitis and recommended patient go to the emergency room for further management.  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  ED Course:   Vitals:   12/19/21 2300 12/20/21 0000 12/20/21 0018 12/20/21 0100  BP: 128/70 121/69 135/71 (!) 106/47  Pulse: (!) 49 (!) 49 (!) 52 (!) 50  Resp: 20 (!) 23 (!) 22 20  Temp:   97.8 F (36.6 C)   TempSrc:   Oral   SpO2: 99% 97% 99% 96%  Weight:      Height:      In the emergency room patient is alert awake oriented afebrile oxygenating 99% on room air. CMP today is within normal limits. CBC is within normal limits today Urinalysis shows large hemoglobin and 0-5 RBCs. >>Ultrasound of the abdomen  shows: IMPRESSION: 1. Cholelithiasis with borderline gallbladder wall thickening and positive sonographic Murphy sign. Findings are indeterminate though could represent early acute cholecystitis.   2. Diffusely increased liver parenchymal echogenicity which is nonspecific but most commonly seen with hepatic steatosis.    Review of Systems:  Review of Systems  Gastrointestinal:  Positive for abdominal pain and nausea.  All other systems reviewed and are negative.  Past Medical History:  Diagnosis Date   Anemia    Aortic aneurysm and dissection (HCC)    a. 2001 s/p grafting and AVR; b. 09/2021 CTA Chest: Dil Ao root up to 70mm. Ao arch 20mm. Desc Ao 36mm. Complicated arch dissection w/ mult false and/or partially thrombosed lumens. Dissection flaps extend into all great vessels except L carotid. Dissection cont into R CCA and into abd->L RA.   Chronic anticoagulation    Dissecting aortic aneurysm, thoracoabdominal (HCC)    Fatty liver    GERD (gastroesophageal reflux disease)    H/O mechanical aortic valve replacement    a. 2001 s/p AVR in setting of Asc Ao aneurysm repair; b. 08/2021 Echo: EF 50-55%, no rwma, mild LVH, GrII DD, nl RV fxn, mild MR, triv AI w/ mean AoV grad .   Heart murmur    History of cardiac catheterization    a. 08/2019 MV: basal-dist lateral and inflat defect; b. 07/2020 Cath: nl cors.   Hyperlipidemia    Hypertension    Hypolipidemia    Iron deficiency    PAF (paroxysmal atrial fibrillation) (HCC)    a. CHA2DS2VASc = 3.  Chronic warfarin (also mech AVR).  Pulmonary nodule    Sleep apnea    Stroke [facility])     Past Surgical History:  Procedure Laterality Date   ABDOMINAL SURGERY     AORTIC VALVE REPLACEMENT  2001   [employer] Mechanical   ARTHROSCOPIC REPAIR ACL     COLONOSCOPY  2019   MENISCUS REPAIR       reports that he has never smoked. He has been exposed to tobacco smoke. He has never used smokeless tobacco. He reports current alcohol use. He  reports that he does not use drugs.  Allergies  Allergen Reactions   Alitraq Rash   Iodinated Contrast Media Rash   Iodine Rash    CT dye CT dye  CT dye CT dye    Family History  Problem Relation Age of Onset   Heart attack Mother    Hypertension Mother    Hyperlipidemia Mother    Heart disease Mother    Heart attack Maternal Grandfather     Prior to Admission medications   Medication Sig Start Date End Date Taking? Authorizing Provider  acetaminophen (TYLENOL) 500 MG tablet Take 500 mg by mouth every 6 (six) hours as needed.    [provider]  aspirin 81 MG EC tablet Take 81 mg by mouth daily.    [provider]  Ferrous Sulfate Dried (HIGH POTENCY IRON) 65 MG TABS Take 650 mg by mouth daily. 08/13/21   [provider]  lisinopril (ZESTRIL) 10 MG tablet Take 1 tablet (10 mg total) by mouth daily. 08/15/21 02/11/22  End, Harrell Gave, MD  lovastatin (MEVACOR) 20 MG tablet Take 1 tablet (20 mg total) by mouth at bedtime. 10/20/21   Jon Billings, NP  Potassium 99 MG TABS Take 99 mg by mouth every evening.    [provider]  warfarin (COUMADIN) 5 MG tablet Take 0.5-1 tablets (2.5-5 mg total) by mouth daily at 4 PM. Take 5 mg Monday, Weds, Sat. 2.5 mg on all other days 12/10/21   Nelva Bush, MD    Physical Exam: Vitals:   12/19/21 2300 12/20/21 0000 12/20/21 0018 12/20/21 0100  BP: 128/70 121/69 135/71 (!) 106/47  Pulse: (!) 49 (!) 49 (!) 52 (!) 50  Resp: 20 (!) 23 (!) 22 20  Temp:   97.8 F (36.6 C)   TempSrc:   Oral   SpO2: 99% 97% 99% 96%  Weight:      Height:       Physical Exam Vitals and nursing note reviewed.  Constitutional:      General: He is not in acute distress.    Appearance: Normal appearance. He is not ill-appearing, toxic-appearing or diaphoretic.  HENT:     Head: Normocephalic and atraumatic.     Right Ear: Hearing and external ear normal.     Left Ear: Hearing and external ear normal.     Nose: Nose normal.  No nasal deformity.     Mouth/Throat:     Lips: Pink.     Mouth: Mucous membranes are moist.     Tongue: No lesions.     Pharynx: Oropharynx is clear.  Eyes:     Extraocular Movements: Extraocular movements intact.     Pupils: Pupils are equal, round, and reactive to light.  Neck:     Vascular: No carotid bruit.  Cardiovascular:     Rate and Rhythm: Normal rate and regular rhythm.     Pulses: Normal pulses.     Heart sounds: Normal heart sounds.  Pulmonary:  Effort: Pulmonary effort is normal.     Breath sounds: Normal breath sounds.  Abdominal:     General: Bowel sounds are normal. There is distension.     Palpations: Abdomen is soft. There is no mass.     Tenderness: There is abdominal tenderness. There is guarding.     Hernia: No hernia is present.  Musculoskeletal:     Right lower leg: No edema.     Left lower leg: No edema.  Skin:    General: Skin is warm.  Neurological:     General: No focal deficit present.     Mental Status: He is alert and oriented to person, place, and time.     Cranial Nerves: Cranial nerves 2-12 are intact.     Motor: Motor function is intact.  Psychiatric:        Attention and Perception: Attention normal.        Mood and Affect: Mood normal.        Speech: Speech normal.        Behavior: Behavior normal. Behavior is cooperative.        Cognition and Memory: Cognition normal.     Labs on Admission: I have personally reviewed following labs and imaging studies BMET Recent Labs  Lab 12/19/21 1625  NA 138  K 4.0  CL 106  CO2 24  BUN 12  CREATININE 1.03  GLUCOSE 98   Electrolytes Recent Labs  Lab 12/19/21 1625  CALCIUM 8.7*   Sepsis Markers No results for input(s): "LATICACIDVEN", "PROCALCITON", "O2SATVEN" in the last 168 hours. ABG No results for input(s): "PHART", "PCO2ART", "PO2ART" in the last 168 hours. Liver Enzymes Recent Labs  Lab 12/19/21 1625  AST 26  ALT 24  ALKPHOS 57  BILITOT 1.2  ALBUMIN 4.3    Cardiac Enzymes No results for input(s): "TROPONINI", "PROBNP" in the last 168 hours. No results found for: "DDIMER" Coag's Recent Labs  Lab 12/19/21 2153  INR 2.4*    No results found for this or any previous visit (from the past 240 hour(s)).   Current Facility-Administered Medications:    0.9 %  sodium chloride infusion, , Intravenous, Continuous, Para Skeans, MD, Last Rate: 50 mL/hr at 12/20/21 0024, Started During Downtime at 12/20/21 0024   acetaminophen (TYLENOL) tablet 650 mg, 650 mg, Oral, Q6H PRN **OR** acetaminophen (TYLENOL) suppository 650 mg, 650 mg, Rectal, Q6H PRN, Para Skeans, MD   aspirin EC tablet 81 mg, 81 mg, Oral, Daily, Posey Pronto, Gretta Cool, MD   hydrALAZINE (APRESOLINE) injection 5 mg, 5 mg, Intravenous, Q4H PRN, Para Skeans, MD   HYDROcodone-acetaminophen (NORCO/VICODIN) 5-325 MG per tablet 1 tablet, 1 tablet, Oral, Q4H PRN, Para Skeans, MD   lisinopril (ZESTRIL) tablet 10 mg, 10 mg, Oral, Daily, Jenasis Straley V, MD   morphine (PF) 2 MG/ML injection 2 mg, 2 mg, Intravenous, Q4H PRN, Para Skeans, MD   pravastatin (PRAVACHOL) tablet 20 mg, 20 mg, Oral, q1800, Florina Ou V, MD   sodium chloride flush (NS) 0.9 % injection 3 mL, 3 mL, Intravenous, Q12H, Para Skeans, MD  Current Outpatient Medications:    acetaminophen (TYLENOL) 500 MG tablet, Take 500 mg by mouth every 6 (six) hours as needed., Disp: , Rfl:    aspirin 81 MG EC tablet, Take 81 mg by mouth daily., Disp: , Rfl:    Ferrous Sulfate Dried (HIGH POTENCY IRON) 65 MG TABS, Take 2 tablets by mouth daily., Disp: , Rfl:    lisinopril (  ZESTRIL) 10 MG tablet, Take 1 tablet (10 mg total) by mouth daily., Disp: 90 tablet, Rfl: 1   lovastatin (MEVACOR) 20 MG tablet, Take 1 tablet (20 mg total) by mouth at bedtime., Disp: 90 tablet, Rfl: 1   Potassium 99 MG TABS, Take 2 tablets by mouth every evening., Disp: , Rfl:    warfarin (COUMADIN) 5 MG tablet, Take 0.5-1 tablets (2.5-5 mg total) by mouth daily at 4 PM.  Take 5 mg Monday, Weds, Sat. 2.5 mg on all other days, Disp: 30 tablet, Rfl: 1  COVID-19 Labs No results for input(s): "DDIMER", "FERRITIN", "LDH", "CRP" in the last 72 hours. Lab Results  Component Value Date   SARSCOV2NAA NEGATIVE 08/26/2021    Radiological Exams on Admission: CT ABDOMEN PELVIS WO CONTRAST  Result Date: 12/20/2021 CLINICAL DATA:  Right-sided abdominal pain for several days, initial encounter EXAM: CT ABDOMEN AND PELVIS WITHOUT CONTRAST TECHNIQUE: Multidetector CT imaging of the abdomen and pelvis was performed following the standard protocol without IV contrast. RADIATION DOSE REDUCTION: This exam was performed according to the departmental dose-optimization program which includes automated exposure control, adjustment of the mA and/or kV according to patient size and/or use of iterative reconstruction technique. COMPARISON:  Ultrasound from the previous day, by report with CT from 11/09/2020. FINDINGS: Lower chest: No acute abnormality. Hepatobiliary: Fatty infiltration of the liver is noted. The gallbladder shows evidence of cholelithiasis similar to that seen on prior ultrasound. No pericholecystic fluid or inflammatory change is seen. Pancreas: Unremarkable. No pancreatic ductal dilatation or surrounding inflammatory changes. Spleen: Normal in size without focal abnormality. Adrenals/Urinary Tract: Adrenal glands are within normal limits. Kidneys show no renal calculi or obstructive changes. Small hypodense exophytic lesions are noted likely representing cysts. No follow-up up is recommended. No obstructive changes are seen. The bladder is well distended. Stomach/Bowel: Scattered diverticular change of the colon is noted. No obstructive changes are seen. The appendix is within normal limits. Small bowel and stomach are within normal limits. Vascular/Lymphatic: Dilatation of the abdominal aorta to 4.3 cm is noted. Chronic changes of dissection are seen. By report this is stable from  prior exam of 2022. Evaluation is limited due to lack of IV contrast. Reproductive: Prostate is within normal limits. Other: There is an area of calcification centrally within the mesentery best seen on image 39 of series 2. Some surrounding inflammatory change in the mesentery is noted. No free fluid is noted. Bilateral fat containing inguinal hernias are noted left greater than right. Musculoskeletal: No acute or significant osseous findings. IMPRESSION: Cholelithiasis without complicating factors. 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 Aneurysmal dilatation of the abdominal aorta to 4.3 cm is noted. Areas of chronic dissection are seen. These are stable in appearance when compared with prior CT by report from 11/09/2020. Recommend follow-up every 12 months and vascular consultation. This recommendation follows ACR consensus guidelines: White Paper of the ACR Incidental Findings Committee II on Vascular Findings. J Am Coll Radiol 2013; 10:789-794. Area of calcification in the mesentery with some surrounding inflammatory change also stable in appearance from prior CT examination from May of 2022. This is likely related to chronic scarring and possible fat necrosis. Electronically Signed   By: Alcide Clever M.D.   On: 12/20/2021 00:30   US ABDOMEN LIMITED RUQ (LIVER/GB)  Result Date: 12/19/2021 CLINICAL DATA:  Right upper quadrant pain EXAM: ULTRASOUND ABDOMEN LIMITED RIGHT UPPER QUADRANT COMPARISON:  None Available. FINDINGS: Gallbladder: Borderline gallbladder wall thickening. There is a shadowing calculus measuring 1.4 cm and sludge. Positive  sonographic Murphy sign. Common bile duct: Diameter: 0.5 cm, within normal limits Liver: No focal lesion identified. Diffusely increased parenchymal echogenicity. Portal vein is patent on color Doppler imaging with normal direction of blood flow towards the liver. Other: None. IMPRESSION: 1. Cholelithiasis with borderline gallbladder wall thickening and positive  sonographic Murphy sign. Findings are indeterminate though could represent early acute cholecystitis. 2. Diffusely increased liver parenchymal echogenicity which is nonspecific but most commonly seen with hepatic steatosis. Electronically Signed   By: Audie Pinto M.D.   On: 12/19/2021 15:21    EKG: Independently reviewed.  Sinus rhythm 73 LVH with QRS of 142 T wave inversions in the lateral leads-V2 V3 V4 V5 V6. T wave inversions in 1 to and aVL          Assessment and Plan: * RUQ pain Pt coming with abd pain concerning for cholecystitis.  We will admit and obtain HIDA. Gen surg on board - dr.cintron.  Prn tylenol.  Pt also has h/o kidney stones and will get ct abd non contrast.      Paroxysmal atrial fibrillation (HCC) Pt is on coumadin.  Pharmacy consult.  Sinus rhythm .   Open angle with borderline findings, low risk, bilateral No eyedrops in chart. review med rec and resume as needed.,   History of heart valve replacement with mechanical valve Cont coumadin and pharmacy consult for Anticoagulation management.   Dissecting aortic aneurysm, thoracoabdominal (HCC) Pt has chronic dissection being followed by cardiology. Pt has never smoked but has lived with smokers all his life, including parents. We will continue home regimen of lisinopril. Prn hydralazine.  Per ct results: Complex Aortic Dissection with previously repaired ascending aorta and aortic valve replacement. Dilated aortic root up to 51 mm diameter. Arch dilated up to 46 mm diameter. Descending aorta dilated up to 54 mm. Complicated Arch dissection with multiple false and/or partially thrombosed lumens. Dissection flaps extend into all great vessels except the Left carotid, and dissection continues cephalad in the Right CCA beyond the level of this imaging. Aortic dissection continues into the abdomen and tracks into the left renal artery.    Chronic kidney disease (CKD), stage II  (mild) Lab Results  Component Value Date   CREATININE 1.03 12/19/2021   CREATININE 1.26 (H) 11/29/2021   CREATININE 0.95 09/29/2021  we will follow. Avoid contrast study. Renally dose meds.     DVT prophylaxis:  SCDs  Code Status:  Full code  Family Communication:  Williams,Phyllis (Relative)  [phone] (Home Phone)   Disposition Plan:  To be determined  Consults called:  General surgery-Dr. Peyton Najjar  Admission status: Inpatient.   Para Skeans MD [facility]  6 PM- 2 AM. Please contact me via secure Chat 6 PM-2 AM. [phone] ( Pager ) To contact the [facility] Attending or Consulting provider [place] or covering provider during after hours [place], for this patient.   Check the care team in [facility] and look for a) attending/consulting [facility] provider listed and b) the [facility] team listed Log into [email or web address] and use [place]'s universal password to access. If you do not have the password, please contact the hospital operator. Locate the [facility] provider you are looking for under [facility] and page to a number that you can be directly reached. If you still have difficulty reaching the provider, please page the [facility] (Director on Call) for the Hospitalists listed on amion for assistance. [email or web address] 12/20/2021, 1:19 AM

## 2021-12-19 NOTE — ED Triage Notes (Signed)
Pt endorses abd pain since Sunday. Seen at PCP today and sent for [place] due to right side tenderness. US performed and worried about gallstones.

## 2021-12-19 NOTE — ED Provider Triage Note (Signed)
Emergency Medicine Provider Triage Evaluation Note  Ryan [name], a 56 y.o. male  was evaluated in triage.  Pt complains of right upper quadrant abdominal pain.  Patient reports after he was notified by his PCP of an outpatient ultrasound report concerning for an acute cholecystitis.  Review of Systems  Positive: RUQ abd pain Negative: FCS  Physical Exam  BP 130/79 (BP Location: Left Arm)   Pulse 75   Temp 98.8 F (37.1 C) (Oral)   Resp 16   SpO2 97%  Gen:   Awake, no distress   Resp:  Normal effort  MSK:   Moves extremities without difficulty  Other:  Soft, minimally tender to palp over the RUQ  Medical Decision Making  Medically screening exam initiated at 4:19 PM.  Appropriate orders placed.  Adelfa Koh was informed that the remainder of the evaluation will be completed by another provider, this initial triage assessment does not replace that evaluation, and the importance of remaining in the ED until their evaluation is complete.  Patient to the ED for evaluation following an outpatient abdominal ultrasound concerning for acute cholecystitis   Thaison Kolodziejski, Charlesetta Ivory, PA-C 12/19/21 1622

## 2021-12-20 ENCOUNTER — Inpatient Hospital Stay: Payer: Medicaid Other

## 2021-12-20 DIAGNOSIS — R1011 Right upper quadrant pain: Secondary | ICD-10-CM | POA: Diagnosis not present

## 2021-12-20 LAB — COMPREHENSIVE METABOLIC PANEL
ALT: 21 U/L (ref 0–44)
AST: 26 U/L (ref 15–41)
Albumin: 3.7 g/dL (ref 3.5–5.0)
Alkaline Phosphatase: 51 U/L (ref 38–126)
Anion gap: 4 — ABNORMAL LOW (ref 5–15)
BUN: 14 mg/dL (ref 6–20)
CO2: 25 mmol/L (ref 22–32)
Calcium: 8.3 mg/dL — ABNORMAL LOW (ref 8.9–10.3)
Chloride: 109 mmol/L (ref 98–111)
Creatinine, Ser: 1.04 mg/dL (ref 0.61–1.24)
GFR, Estimated: >60 mL/min (ref >60)
Glucose, Bld: 96 mg/dL (ref 70–99)
Potassium: 3.6 mmol/L (ref 3.5–5.1)
Sodium: 138 mmol/L (ref 135–145)
Total Bilirubin: 1.1 mg/dL (ref 0.3–1.2)
Total Protein: 6.6 g/dL (ref 6.5–8.1)

## 2021-12-20 LAB — CBC
HCT: 35.6 % — ABNORMAL LOW (ref 39.0–52.0)
Hemoglobin: 12.1 g/dL — ABNORMAL LOW (ref 13.0–17.0)
MCH: 31.8 pg (ref 26.0–34.0)
MCHC: 34 g/dL (ref 30.0–36.0)
MCV: 93.4 fL (ref 80.0–100.0)
Platelets: 126 10*3/uL — ABNORMAL LOW (ref 150–400)
RBC: 3.81 MIL/uL — ABNORMAL LOW (ref 4.22–5.81)
RDW: 14.1 % (ref 11.5–15.5)
WBC: 5.1 10*3/uL (ref 4.0–10.5)
nRBC: 0 % (ref 0.0–0.2)

## 2021-12-20 LAB — PROTIME-INR
INR: 2.4 — ABNORMAL HIGH (ref 0.8–1.2)
Prothrombin Time: 26.1 seconds — ABNORMAL HIGH (ref 11.4–15.2)

## 2021-12-20 MED ORDER — ASPIRIN 81 MG PO TBEC
81.0000 mg | DELAYED_RELEASE_TABLET | Freq: Every day | ORAL | Status: DC
  Administered 2021-12-20 – 2021-12-22 (×3): 81 mg via ORAL
  Filled 2021-12-20 (×3): qty 1

## 2021-12-20 MED ORDER — WARFARIN SODIUM 2.5 MG PO TABS: 2.5000 mg | ORAL_TABLET | ORAL | Status: DC

## 2021-12-20 MED ORDER — PIPERACILLIN-TAZOBACTAM 3.375 G IVPB
3.3750 g | Freq: Four times a day (QID) | INTRAVENOUS | Status: DC
Start: 2021-12-20 — End: 2021-12-20

## 2021-12-20 MED ORDER — WARFARIN SODIUM 5 MG PO TABS
5.0000 mg | ORAL_TABLET | ORAL | Status: AC
Start: 2021-12-20 — End: 2021-12-20
  Administered 2021-12-20: 5 mg via ORAL
  Filled 2021-12-20: qty 1

## 2021-12-20 MED ORDER — PRAVASTATIN SODIUM 20 MG PO TABS
20.0000 mg | ORAL_TABLET | Freq: Every day | ORAL | Status: DC
  Administered 2021-12-20 – 2021-12-21 (×2): 20 mg via ORAL
  Filled 2021-12-20 (×2): qty 1

## 2021-12-20 MED ORDER — WARFARIN SODIUM 2.5 MG PO TABS
2.5000 mg | ORAL_TABLET | Freq: Once | ORAL | Status: DC
Start: 2021-12-20 — End: 2021-12-20
  Filled 2021-12-20: qty 1

## 2021-12-20 MED ORDER — LISINOPRIL 10 MG PO TABS
10.0000 mg | ORAL_TABLET | Freq: Every day | ORAL | Status: DC
  Administered 2021-12-20 – 2021-12-22 (×3): 10 mg via ORAL
  Filled 2021-12-20 (×3): qty 1

## 2021-12-20 MED ORDER — TECHNETIUM TC 99M MEBROFENIN IV KIT
5.0000 | PACK | Freq: Once | INTRAVENOUS | Status: AC | PRN
  Administered 2021-12-20: 5.25 via INTRAVENOUS

## 2021-12-20 MED ORDER — WARFARIN SODIUM 5 MG PO TABS: 5.0000 mg | ORAL_TABLET | ORAL | Status: DC

## 2021-12-20 MED ORDER — WARFARIN SODIUM 2.5 MG PO TABS
2.5000 mg | ORAL_TABLET | ORAL | Status: DC
  Filled 2021-12-20: qty 1

## 2021-12-20 MED ORDER — WARFARIN - PHARMACIST DOSING INPATIENT
Freq: Every day | Status: DC
  Filled 2021-12-20: qty 1

## 2021-12-20 MED ORDER — PIPERACILLIN-TAZOBACTAM 3.375 G IVPB
3.3750 g | Freq: Three times a day (TID) | INTRAVENOUS | Status: DC
  Administered 2021-12-20 – 2021-12-22 (×6): 3.375 g via INTRAVENOUS
  Filled 2021-12-20 (×8): qty 50

## 2021-12-20 NOTE — Progress Notes (Signed)
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, history of strokes TIAs, paroxysmal atrial fibrillation, hypertension, hyperlipidemia, obstructive sleep apnea, March 2023 echo showing 50 to 55% ejection fraction with mild LVH grade 2 diastolic dysfunction normal RV function mild MR, no right wall motion abnormality, iron deficiency anemia  was seen by primary care physician who ordered right upper quadrant ultrasound for suspicion of acute cholecystitis and recommended patient go to the emergency room for further management    Consultants:  surgery  Procedures:   Antimicrobials:      Subjective: With ruq pain.no sob or cp  Objective: Vitals:   12/20/21 0600 12/20/21 0800 12/20/21 1000 12/20/21 1300  BP: 102/64 104/62 112/74 122/80  Pulse: (!) 53 (!) 49 (!) 50 (!) 55  Resp: 20 (!) 21 18 20   Temp:  98.8 F (37.1 C) 98.6 F (37 C) 98.7 F (37.1 C)  TempSrc:  Oral Oral Oral  SpO2: 94% 96% 98% 98%  Weight:      Height:        Intake/Output Summary (Last 24 hours) at 12/20/2021 1612 Last data filed at 12/20/2021 0411 Gross per 24 hour  Intake 124.58 ml  Output 200 ml  Net -75.42 ml   Filed Weights   12/19/21 1623  Weight: 80 kg    Examination: Calm, NAD Cta no w/r Reg s1/s2 no gallop Soft TTP /voluntary guarding of LUQ No edema Aaoxox3  Mood and affect appropriate in current setting     Data Reviewed: I have personally reviewed following labs and imaging studies  CBC: Recent Labs  Lab 12/19/21 1625 12/20/21 0411  WBC 5.8 5.1  HGB 13.4 12.1*  HCT 40.0 35.6*  MCV 93.7 93.4  PLT 166 126*   Basic Metabolic Panel: Recent Labs  Lab  12/19/21 1625 12/20/21 0411  NA 138 138  K 4.0 3.6  CL 106 109  CO2 24 25  GLUCOSE 98 96  BUN 12 14  CREATININE 1.03 1.04  CALCIUM 8.7* 8.3*   GFR: Estimated Creatinine Clearance: 82.3 mL/min (by C-G formula based on SCr of 1.04 mg/dL). Liver Function Tests: Recent Labs  Lab 12/19/21 1625 12/20/21 0411  AST 26 26  ALT 24 21  ALKPHOS 57 51  BILITOT 1.2 1.1  PROT 7.3 6.6  ALBUMIN 4.3 3.7   Recent Labs  Lab 12/19/21 1625  LIPASE 48   No results for input(s): "AMMONIA" in the last 168 hours. Coagulation Profile: Recent Labs  Lab 12/19/21 2153 12/20/21 0411  INR 2.4* 2.4*   Cardiac Enzymes: No results for input(s): "CKTOTAL", "CKMB", "CKMBINDEX", "TROPONINI" in the last 168 hours. BNP (last 3 results) No results for input(s): "PROBNP" in the last 8760 hours. HbA1C: No results for input(s): "HGBA1C" in the last 72 hours. CBG: No results for input(s): "GLUCAP" in the last 168 hours. Lipid Profile: No results for input(s): "CHOL", "HDL", "LDLCALC", "TRIG", "CHOLHDL", "LDLDIRECT" in the last 72 hours. Thyroid Function Tests: No results for input(s): "TSH", "T4TOTAL", "FREET4", "T3FREE", "THYROIDAB" in the last 72 hours. Anemia Panel: No results for input(s): "VITAMINB12", "FOLATE", "FERRITIN", "TIBC", "IRON", "RETICCTPCT" in the last 72 hours. Sepsis Labs: No results for input(s): "PROCALCITON", "LATICACIDVEN" in the  last 168 hours.  No results found for this or any previous visit (from the past 240 hour(s)).       Radiology Studies: NM Hepatobiliary Liver Func  Result Date: 12/20/2021 CLINICAL DATA:  Evaluate for acute cholecystitis. EXAM: NUCLEAR MEDICINE HEPATOBILIARY IMAGING TECHNIQUE: Sequential images of the abdomen were obtained out to 60 minutes following intravenous administration of radiopharmaceutical. RADIOPHARMACEUTICALS:  5.25 mCi Tc-81m  Choletec IV COMPARISON:  CT from 12/20/2021 and gallbladder sonogram from 12/19/2021. A. FINDINGS: Prompt  uptake and biliary excretion of activity by the liver is seen. Biliary activity passes into small bowel, consistent with patent common bile duct. After 1 hour of imaging no gallbladder activity was visualized. Imaging was performed for an additional 30 minutes without gallbladder activity. IMPRESSION: 1. Nonvisualization of the gallbladder compatible with cholecystitis. 2. Patent common bile duct. Electronically Signed   By: Signa Kell M.D.   On: 12/20/2021 11:39   CT ABDOMEN PELVIS WO CONTRAST  Result Date: 12/20/2021 CLINICAL DATA:  Right-sided abdominal pain for several days, initial encounter EXAM: CT ABDOMEN AND PELVIS WITHOUT CONTRAST TECHNIQUE: Multidetector CT imaging of the abdomen and pelvis was performed following the standard protocol without IV contrast. RADIATION DOSE REDUCTION: This exam was performed according to the departmental dose-optimization program which includes automated exposure control, adjustment of the mA and/or kV according to patient size and/or use of iterative reconstruction technique. COMPARISON:  Ultrasound from the previous day, by report with CT from 11/09/2020. FINDINGS: Lower chest: No acute abnormality. Hepatobiliary: Fatty infiltration of the liver is noted. The gallbladder shows evidence of cholelithiasis similar to that seen on prior ultrasound. No pericholecystic fluid or inflammatory change is seen. Pancreas: Unremarkable. No pancreatic ductal dilatation or surrounding inflammatory changes. Spleen: Normal in size without focal abnormality. Adrenals/Urinary Tract: Adrenal glands are within normal limits. Kidneys show no renal calculi or obstructive changes. Small hypodense exophytic lesions are noted likely representing cysts. No follow-up up is recommended. No obstructive changes are seen. The bladder is well distended. Stomach/Bowel: Scattered diverticular change of the colon is noted. No obstructive changes are seen. The appendix is within normal limits. Small  bowel and stomach are within normal limits. Vascular/Lymphatic: Dilatation of the abdominal aorta to 4.3 cm is noted. Chronic changes of dissection are seen. By report this is stable from prior exam of 2022. Evaluation is limited due to lack of IV contrast. Reproductive: Prostate is within normal limits. Other: There is an area of calcification centrally within the mesentery best seen on image 39 of series 2. Some surrounding inflammatory change in the mesentery is noted. No free fluid is noted. Bilateral fat containing inguinal hernias are noted left greater than right. Musculoskeletal: No acute or significant osseous findings. IMPRESSION: Cholelithiasis without complicating factors. 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 Aneurysmal dilatation of the abdominal aorta to 4.3 cm is noted. Areas of chronic dissection are seen. These are stable in appearance when compared with prior CT by report from 11/09/2020. Recommend follow-up every 12 months and vascular consultation. This recommendation follows ACR consensus guidelines: White Paper of the ACR Incidental Findings Committee II on Vascular Findings. J Am Coll Radiol 2013; 10:789-794. Area of calcification in the mesentery with some surrounding inflammatory change also stable in appearance from prior CT examination from May of 2022. This is likely related to chronic scarring and possible fat necrosis. Electronically Signed   By: Alcide Clever M.D.   On: 12/20/2021 00:30   US ABDOMEN LIMITED RUQ (LIVER/GB)  Result Date: 12/19/2021 CLINICAL DATA:  Right  upper quadrant pain EXAM: ULTRASOUND ABDOMEN LIMITED RIGHT UPPER QUADRANT COMPARISON:  None Available. FINDINGS: Gallbladder: Borderline gallbladder wall thickening. There is a shadowing calculus measuring 1.4 cm and sludge. Positive sonographic Murphy sign. Common bile duct: Diameter: 0.5 cm, within normal limits Liver: No focal lesion identified. Diffusely increased parenchymal echogenicity. Portal vein is  patent on color Doppler imaging with normal direction of blood flow towards the liver. Other: None. IMPRESSION: 1. Cholelithiasis with borderline gallbladder wall thickening and positive sonographic Murphy sign. Findings are indeterminate though could represent early acute cholecystitis. 2. Diffusely increased liver parenchymal echogenicity which is nonspecific but most commonly seen with hepatic steatosis. Electronically Signed   By: Audie Pinto M.D.   On: 12/19/2021 15:21        Scheduled Meds:  aspirin EC  81 mg Oral Daily   lisinopril  10 mg Oral Daily   pravastatin  20 mg Oral q1800   sodium chloride flush  3 mL Intravenous Q12H   Continuous Infusions:  sodium chloride 50 mL/hr at 12/20/21 0024   piperacillin-tazobactam (ZOSYN)  IV 3.375 g (12/20/21 1321)    Assessment & Plan:   Principal Problem:   RUQ pain Active Problems:   Paroxysmal atrial fibrillation (HCC)   Chronic kidney disease (CKD), stage II (mild)   Dissecting aortic aneurysm, thoracoabdominal (HCC)   History of heart valve replacement with mechanical valve   Open angle with borderline findings, low risk, bilateral   1.RUQ Hida with cholecystitis Holding Coumadin in case needs surgical intervention if fails conservative therapy General surgery following Continue IV antibiotics therapy and if he responds, he should be evaluated for delayed cholecystectomy in 6 to 8 weeks after episode.  If fails recommend percutaneous cholecystectomy by IR Continue IV antibiotics   2. PAF In sinus On coumadin..>will hold in case will need surgery  Once inr <2, will bridge with heparin   3. History of heart valve replacement with mechanical valve Coumadin, once INR less than 2 would bridge with heparin   4. Dissecting aortic aneurysm, thoracoabdominal (HCC) Chronic Dc hydralazine as it increases shear stress. Followed by Dr. End-cardiology Keep bp on lower side Currently without pain. Monitor  closely    DVT prophylaxis: on coumadin Code Status:full Family Communication: none at bedside Disposition Plan: back home Status is: Inpatient Remains inpatient appropriate because: iv treatment      LOS: 1 day   Time spent: 57 min    Nolberto Hanlon, MD [facility] Pager [phone]  If 7PM-7AM, please contact night-coverage 12/20/2021, 4:12 PM

## 2021-12-20 NOTE — Assessment & Plan Note (Addendum)
Pt coming with abd pain concerning for cholecystitis.  We will admit and obtain HIDA. Gen surg on board - dr.cintron.  Prn tylenol.  Pt also has h/o kidney stones and will get ct abd non contrast.

## 2021-12-20 NOTE — Assessment & Plan Note (Signed)
Lab Results  Component Value Date   CREATININE 1.03 12/19/2021   CREATININE 1.26 (H) 11/29/2021   CREATININE 0.95 09/29/2021  we will follow. Avoid contrast study. Renally dose meds.

## 2021-12-20 NOTE — Assessment & Plan Note (Signed)
Pt is on coumadin.  Pharmacy consult.  Sinus rhythm .

## 2021-12-20 NOTE — Assessment & Plan Note (Addendum)
Pt has chronic dissection being followed by cardiology. Pt has never smoked but has lived with smokers all his life, including parents. We will continue home regimen of lisinopril. Prn hydralazine.  Per ct results: Complex Aortic Dissection with previously repaired ascending aorta and aortic valve replacement. Dilated aortic root up to 51 mm diameter. Arch dilated up to 46 mm diameter. Descending aorta dilated up to 54 mm. Complicated Arch dissection with multiple false and/or partially thrombosed lumens. Dissection flaps extend into all great vessels except the Left carotid, and dissection continues cephalad in the Right CCA beyond the level of this imaging. Aortic dissection continues into the abdomen and tracks into the left renal artery.

## 2021-12-20 NOTE — Progress Notes (Signed)
ANTICOAGULATION CONSULT NOTE - Initial Consult  Pharmacy Consult for Warfarin  Indication: atrial fibrillation and mechanical heart valve  Allergies  Allergen Reactions   Alitraq Rash   Iodinated Contrast Media Rash   Iodine Rash    CT dye CT dye  CT dye CT dye    Patient Measurements: Height: 5\' 7"  (170.2 cm) Weight: 80 kg (176 lb 5.9 oz) IBW/kg (Calculated) : 66.1 Heparin Dosing Weight:   Vital Signs: Temp: 97.8 F (36.6 C) (07/08 0018) Temp Source: Oral (07/08 0018) BP: 106/47 (07/08 0100) Pulse Rate: 50 (07/08 0100)  Labs: Recent Labs    12/19/21 1625 12/19/21 2153  HGB 13.4  --   HCT 40.0  --   PLT 166  --   LABPROT  --  26.2*  INR  --  2.4*  CREATININE 1.03  --     Estimated Creatinine Clearance: 83.1 mL/min (by C-G formula based on SCr of 1.03 mg/dL).   Medical History: Past Medical History:  Diagnosis Date   Anemia    Aortic aneurysm and dissection (HCC)    a. 2001 s/p grafting and AVR; b. 09/2021 CTA Chest: Dil Ao root up to 36mm. Ao arch 41mm. Desc Ao 45mm. Complicated arch dissection w/ mult false and/or partially thrombosed lumens. Dissection flaps extend into all great vessels except L carotid. Dissection cont into R CCA and into abd->L RA.   Chronic anticoagulation    Dissecting aortic aneurysm, thoracoabdominal (HCC)    Fatty liver    GERD (gastroesophageal reflux disease)    H/O mechanical aortic valve replacement    a. 2001 s/p AVR in setting of Asc Ao aneurysm repair; b. 08/2021 Echo: EF 50-55%, no rwma, mild LVH, GrII DD, nl RV fxn, mild MR, triv AI w/ mean AoV grad 09/2021.   Heart murmur    History of cardiac catheterization    a. 08/2019 MV: basal-dist lateral and inflat defect; b. 07/2020 Cath: nl cors.   Hyperlipidemia    Hypertension    Hypolipidemia    Iron deficiency    PAF (paroxysmal atrial fibrillation) (HCC)    a. CHA2DS2VASc = 3.  Chronic warfarin (also mech AVR).   Pulmonary nodule    Sleep apnea    Stroke [facility])      Medications:    Assessment: Pharmacy consulted to dose warfarin in this 56 year old male admitted with AFib, Mech Heart Valve.    Home warfarin dose :   warfarin 5 mg PO Mon, Wed and Sat                                         Warfarin 2.5 mg PO Tue, Th, Fri and Sun -   last warfarin dose 2.5 mg PO on 7/6 @ 2300.  Pt did not take dose on 7/7.  INR :  7/7 @ 2153 = 2.4 , SUBtherapeutic  Goal of Therapy:  INR :  2.5 - 3.5   Plan:  INR on 7/7 @ 2153 = 2.4 , subtherapeutic - Pt did not have PM dose on 7/7 - Will order warfarin 5 mg PO STAT for 7/8 @ 0200 (100 % of usual Fri dose) - Will reorder pt home warfarin dose :         Warfarin 5 mg PO Mon, Wed and Sat         Warfarin 2.5 mg PO Tue, Thurs, Fri  and Sun  - Will check INR/CBC daily.   Makana Rostad D 12/20/2021,2:01 AM

## 2021-12-20 NOTE — Progress Notes (Signed)
ANTICOAGULATION CONSULT NOTE - Initial Consult  Pharmacy Consult for Warfarin  Indication: atrial fibrillation and mechanical heart valve  Allergies  Allergen Reactions   Alitraq Rash   Iodinated Contrast Media Rash   Iodine Rash    CT dye CT dye  CT dye CT dye    Patient Measurements: Height: 5\' 7"  (170.2 cm) Weight: 80 kg (176 lb 5.9 oz) IBW/kg (Calculated) : 66.1 Heparin Dosing Weight:   Vital Signs: Temp: 98.6 F (37 C) (07/08 1000) Temp Source: Oral (07/08 1000) BP: 112/74 (07/08 1000) Pulse Rate: 50 (07/08 1000)  Labs: Recent Labs    12/19/21 1625 12/19/21 2153 12/20/21 0411  HGB 13.4  --  12.1*  HCT 40.0  --  35.6*  PLT 166  --  126*  LABPROT  --  26.2* 26.1*  INR  --  2.4* 2.4*  CREATININE 1.03  --  1.04     Estimated Creatinine Clearance: 82.3 mL/min (by C-G formula based on SCr of 1.04 mg/dL).   Medical History: Past Medical History:  Diagnosis Date   Anemia    Aortic aneurysm and dissection (HCC)    a. 2001 s/p grafting and AVR; b. 09/2021 CTA Chest: Dil Ao root up to 27mm. Ao arch 33mm. Desc Ao 51mm. Complicated arch dissection w/ mult false and/or partially thrombosed lumens. Dissection flaps extend into all great vessels except L carotid. Dissection cont into R CCA and into abd->L RA.   Chronic anticoagulation    Dissecting aortic aneurysm, thoracoabdominal (HCC)    Fatty liver    GERD (gastroesophageal reflux disease)    H/O mechanical aortic valve replacement    a. 2001 s/p AVR in setting of Asc Ao aneurysm repair; b. 08/2021 Echo: EF 50-55%, no rwma, mild LVH, GrII DD, nl RV fxn, mild MR, triv AI w/ mean AoV grad 09/2021.   Heart murmur    History of cardiac catheterization    a. 08/2019 MV: basal-dist lateral and inflat defect; b. 07/2020 Cath: nl cors.   Hyperlipidemia    Hypertension    Hypolipidemia    Iron deficiency    PAF (paroxysmal atrial fibrillation) (HCC)    a. CHA2DS2VASc = 3.  Chronic warfarin (also mech AVR).   Pulmonary  nodule    Sleep apnea    Stroke [facility])     Medications:  Home warfarin dose :    Warfarin 5 mg PO Mon, Wed and Sat Warfarin 2.5 mg PO Tue, Th, Fri and Sun - last warfarin dose 2.5 mg PO on 7/6 @ 2300.  Pt did not take dose on 7/7.   Assessment: Pharmacy consulted to dose warfarin in this 56 year old male admitted with AFib, Mech Heart Valve.      INR :  7/7 @ 2153 = 2.4 , SUBtherapeutic  Goal of Therapy:  INR :  2.5 - 3.5   Plan:  INR on 7/7 @ 0411 = 2.4 , subtherapeutic - Pt did not have PM dose on 7/7, but was given 5mg  dose at 0409 on 7/8. - will give additional 2.5mg  dose this afternoon - Will check INR/CBC daily.   Lela Murfin A Gracen Ringwald 12/20/2021,11:11 AM

## 2021-12-20 NOTE — Assessment & Plan Note (Signed)
Cont coumadin and pharmacy consult for Anticoagulation management.

## 2021-12-20 NOTE — Assessment & Plan Note (Addendum)
No eyedrops in chart. review med rec and resume as needed.,

## 2021-12-20 NOTE — Consult Note (Signed)
SURGICAL CONSULTATION NOTE   HISTORY OF PRESENT ILLNESS (HPI):  56 y.o. male presented to [facility] ED for evaluation of abdominal pain since 6 days ago. Patient reports he started having abdominal pain last Sunday.  Pain localized in the upper abdomen.  Pain radiates to the right upper quadrant.  Pain aggravated by eating.  At the ED he was found with ultrasound showing cholelithiasis with minimal gallbladder wall thickening.  CT scan of the abdomen and pelvis shows distended bowel but no stranding.  I personally evaluated the images.  Due to acute occult findings of cholecystitis patient was admitted for further work-up of the abdominal pain.  Patient had HIDA scan this morning that shows nonvisualization of the gallbladder consistent with cholecystitis.  I personally evaluated the images of the HIDA scan.  Patient has significant history of A-fib on Coumadin, aortic aneurysm and dissection s/p grafting and aortic valve replacement on Coumadin.  Surgery is consulted by Dr. Sidney Ace in this context for evaluation and management of abdominal pain.  PAST MEDICAL HISTORY (PMH):  Past Medical History:  Diagnosis Date   Anemia    Aortic aneurysm and dissection (HCC)    a. 2001 s/p grafting and AVR; b. 09/2021 CTA Chest: Dil Ao root up to 72mm. Ao arch 75mm. Desc Ao 61mm. Complicated arch dissection w/ mult false and/or partially thrombosed lumens. Dissection flaps extend into all great vessels except L carotid. Dissection cont into R CCA and into abd->L RA.   Chronic anticoagulation    Dissecting aortic aneurysm, thoracoabdominal (HCC)    Fatty liver    GERD (gastroesophageal reflux disease)    H/O mechanical aortic valve replacement    a. 2001 s/p AVR in setting of Asc Ao aneurysm repair; b. 08/2021 Echo: EF 50-55%, no rwma, mild LVH, GrII DD, nl RV fxn, mild MR, triv AI w/ mean AoV grad .   Heart murmur    History of cardiac catheterization    a. 08/2019 MV: basal-dist lateral and inflat defect;  b. 07/2020 Cath: nl cors.   Hyperlipidemia    Hypertension    Hypolipidemia    Iron deficiency    PAF (paroxysmal atrial fibrillation) (HCC)    a. CHA2DS2VASc = 3.  Chronic warfarin (also mech AVR).   Pulmonary nodule    Sleep apnea    Stroke (HCC)      PAST SURGICAL HISTORY (PSH):  Past Surgical History:  Procedure Laterality Date   ABDOMINAL SURGERY     AORTIC VALVE REPLACEMENT  2001   St Jude Mechanical   ARTHROSCOPIC REPAIR ACL     COLONOSCOPY  2019   MENISCUS REPAIR       MEDICATIONS:  Prior to Admission medications   Medication Sig Start Date End Date Taking? Authorizing Provider  acetaminophen (TYLENOL) 500 MG tablet Take 500 mg by mouth every 6 (six) hours as needed.   Yes [provider]  aspirin 81 MG EC tablet Take 81 mg by mouth daily.   Yes [provider]  Ferrous Sulfate Dried (HIGH POTENCY IRON) 65 MG TABS Take 2 tablets by mouth daily. 08/13/21  Yes [provider]  lisinopril (ZESTRIL) 10 MG tablet Take 1 tablet (10 mg total) by mouth daily. 08/15/21 02/11/22 Yes End, Cristal Deer, MD  lovastatin (MEVACOR) 20 MG tablet Take 1 tablet (20 mg total) by mouth at bedtime. 10/20/21  Yes Larae Grooms, NP  Potassium 99 MG TABS Take 2 tablets by mouth every evening.   Yes [provider]  warfarin (COUMADIN)  5 MG tablet Take 0.5-1 tablets (2.5-5 mg total) by mouth daily at 4 PM. Take 5 mg Monday, Weds, Sat. 2.5 mg on all other days 12/10/21  Yes End, Cristal Deer, MD     ALLERGIES:  Allergies  Allergen Reactions   Alitraq Rash   Iodinated Contrast Media Rash   Iodine Rash    CT dye CT dye  CT dye CT dye     SOCIAL HISTORY:  Social History   Socioeconomic History   Marital status: Single    Spouse name: Not on file   Number of children: Not on file   Years of education: Not on file   Highest education level: Not on file  Occupational History   Not on file  Tobacco Use   Smoking status: Never    Passive exposure: Past    Smokeless tobacco: Never  Vaping Use   Vaping Use: Never used  Substance and Sexual Activity   Alcohol use: Yes    Comment: 2 mixed drinks per year   Drug use: Never   Sexual activity: Not Currently  Other Topics Concern   Not on file  Social History Narrative   Not on file   Social Determinants of Health   Financial Resource Strain: Not on file  Food Insecurity: Not on file  Transportation Needs: Not on file  Physical Activity: Not on file  Stress: Not on file  Social Connections: Not on file  Intimate Partner Violence: Not on file      FAMILY HISTORY:  Family History  Problem Relation Age of Onset   Heart attack Mother    Hypertension Mother    Hyperlipidemia Mother    Heart disease Mother    Heart attack Maternal Grandfather      REVIEW OF SYSTEMS:  Constitutional: denies weight loss, fever, chills, or sweats  Eyes: denies any other vision changes, history of eye injury  ENT: denies sore throat, hearing problems  Respiratory: denies shortness of breath, wheezing  Cardiovascular: denies chest pain, palpitations  Gastrointestinal: Positive abdominal pain, nausea and vomiting Genitourinary: denies burning with urination or urinary frequency Musculoskeletal: denies any other joint pains or cramps  Skin: denies any other rashes or skin discolorations  Neurological: denies any other headache, dizziness, weakness  Psychiatric: denies any other depression, anxiety   All other review of systems were negative   VITAL SIGNS:  Temp:  [97.7 F (36.5 C)-98.8 F (37.1 C)] 97.8 F (36.6 C) (07/08 0018) Pulse Rate:  [47-75] 53 (07/08 0600) Resp:  [16-23] 20 (07/08 0600) BP: (94-135)/(47-79) 102/64 (07/08 0600) SpO2:  [94 %-100 %] 94 % (07/08 0600) Weight:  [79.8 kg-80 kg] 80 kg (07/07 1623)     Height: 5\' 7"  (170.2 cm) Weight: 80 kg BMI (Calculated): 27.62   INTAKE/OUTPUT:  This shift: No intake/output data recorded.  Last 2 shifts: @IOLAST2SHIFTS @   PHYSICAL  EXAM:  Constitutional:  -- Normal body habitus  -- Awake, alert, and oriented x3  Eyes:  -- Pupils equally round and reactive to light  -- No scleral icterus  Ear, nose, and throat:  -- No jugular venous distension  Pulmonary:  -- No crackles  -- Equal breath sounds bilaterally -- Breathing non-labored at rest Cardiovascular:  -- S1, S2 present  -- No pericardial rubs Gastrointestinal:  -- Abdomen soft, tender to palpation in the right upper quadrant, non-distended, no guarding or rebound tenderness -- No abdominal masses appreciated, pulsatile or otherwise  Musculoskeletal and Integumentary:  -- Wounds: None appreciated --  Extremities: B/L UE and LE FROM, hands and feet warm, no edema  Neurologic:  -- Motor function: intact and symmetric -- Sensation: intact and symmetric   Labs:     Latest Ref Rng & Units 12/20/2021    4:11 AM 12/19/2021    4:25 PM 11/29/2021    9:36 PM  CBC  WBC 4.0 - 10.5 K/uL 5.1  5.8  6.7   Hemoglobin 13.0 - 17.0 g/dL 12.1  13.4  12.1   Hematocrit 39.0 - 52.0 % 35.6  40.0  36.2   Platelets 150 - 400 K/uL 126  166  155       Latest Ref Rng & Units 12/20/2021    4:11 AM 12/19/2021    4:25 PM 11/29/2021    9:36 PM  CMP  Glucose 70 - 99 mg/dL 96  98  99   BUN 6 - 20 mg/dL 14  12  17    Creatinine 0.61 - 1.24 mg/dL 1.04  1.03  1.26   Sodium 135 - 145 mmol/L 138  138  143   Potassium 3.5 - 5.1 mmol/L 3.6  4.0  3.7   Chloride 98 - 111 mmol/L 109  106  110   CO2 22 - 32 mmol/L 25  24  27    Calcium 8.9 - 10.3 mg/dL 8.3  8.7  8.8   Total Protein 6.5 - 8.1 g/dL 6.6  7.3    Total Bilirubin 0.3 - 1.2 mg/dL 1.1  1.2    Alkaline Phos 38 - 126 U/L 51  57    AST 15 - 41 U/L 26  26    ALT 0 - 44 U/L 21  24      Imaging studies:  EXAM: NUCLEAR MEDICINE HEPATOBILIARY IMAGING   TECHNIQUE: Sequential images of the abdomen were obtained out to 60 minutes following intravenous administration of radiopharmaceutical.   RADIOPHARMACEUTICALS:  5.25 mCi Tc-36m   Choletec IV   COMPARISON:  CT from 12/20/2021 and gallbladder sonogram from 12/19/2021. A.   FINDINGS: Prompt uptake and biliary excretion of activity by the liver is seen. Biliary activity passes into small bowel, consistent with patent common bile duct. After 1 hour of imaging no gallbladder activity was visualized. Imaging was performed for an additional 30 minutes without gallbladder activity.   IMPRESSION: 1. Nonvisualization of the gallbladder compatible with cholecystitis. 2. Patent common bile duct.     Electronically Signed   By: Kerby Moors M.D.   On: 12/20/2021 11:39  Assessment/Plan:  56 y.o. male with acute cholecystitis, complicated by pertinent comorbidities including A-fib, history of aortic dissection status post grafting and aortic valve replacement on chronic Coumadin.  Patient with 6-day history of abdominal pain.  HIDA scan confirmed that pain etiology is cholecystitis.  Due to the pain onset more than 5 days, patient on anticoagulation on Coumadin and significant cardiac history patient not a surgical candidate for acute cholecystitis.  I will recommend to treat patient with IV antibiotic therapy.  If patient respond to IV antibiotic therapy he should be evaluated for delayed cholecystectomy 6 to 8 weeks after episode.  If he does not respond to IV antibiotic therapy I would recommend percutaneous cholecystostomy by IR.  Currently patient with localized right upper quadrant pain.  No acute abdomen.  Normal white blood cell count.  Normal bilirubin level.  I will continue to follow along.  I recommend to hold Coumadin possible bridging with heparin drip in case he does not respond to IV antibiotic therapy to  avoid any risk during cholecystostomy.  I will try clear liquid diet.  Patient oriented that if he develops any worsening pain he will need to hold diet.  Arnold Long, MD

## 2021-12-21 DIAGNOSIS — R1011 Right upper quadrant pain: Secondary | ICD-10-CM | POA: Diagnosis not present

## 2021-12-21 LAB — CBC
HCT: 37.5 % — ABNORMAL LOW (ref 39.0–52.0)
Hemoglobin: 12.7 g/dL — ABNORMAL LOW (ref 13.0–17.0)
MCH: 31.4 pg (ref 26.0–34.0)
MCHC: 33.9 g/dL (ref 30.0–36.0)
MCV: 92.8 fL (ref 80.0–100.0)
Platelets: 140 10*3/uL — ABNORMAL LOW (ref 150–400)
RBC: 4.04 MIL/uL — ABNORMAL LOW (ref 4.22–5.81)
RDW: 13.8 % (ref 11.5–15.5)
WBC: 4.7 10*3/uL (ref 4.0–10.5)
nRBC: 0 % (ref 0.0–0.2)

## 2021-12-21 LAB — PROTIME-INR
INR: 2.6 — ABNORMAL HIGH (ref 0.8–1.2)
Prothrombin Time: 27.4 seconds — ABNORMAL HIGH (ref 11.4–15.2)

## 2021-12-21 NOTE — Progress Notes (Signed)
Patient ID: Ryan [name], male   DOB: 1966-01-10, 56 y.o.   MRN: [id number]     SURGICAL PROGRESS NOTE   Hospital Day(s): 2.   Interval History: Patient seen and examined, no acute events or new complaints overnight. Patient reports feeling better.  He denies abdominal pain.  He denies any pain with a clear liquid diet.  He denies any nausea or vomiting.  Vital signs in last 24 hours: [min-max] current  Temp:  [97.6 F (36.4 C)-98.7 F (37.1 C)] 97.6 F (36.4 C) (07/09 0500) Pulse Rate:  [44-55] 45 (07/09 0734) Resp:  [15-20] 17 (07/09 0734) BP: (102-124)/(57-85) 111/63 (07/09 0734) SpO2:  [96 %-100 %] 96 % (07/09 0734)     Height: 5\' 7"  (170.2 cm) Weight: 80 kg BMI (Calculated): 27.62   Physical Exam:  Constitutional: alert, cooperative and no distress  Respiratory: breathing non-labored at rest  Cardiovascular: regular rate and sinus rhythm  Gastrointestinal: soft, non-tender, and non-distended  Labs:     Latest Ref Rng & Units 12/21/2021    9:23 AM 12/20/2021    4:11 AM 12/19/2021    4:25 PM  CBC  WBC 4.0 - 10.5 K/uL 4.7  5.1  5.8   Hemoglobin 13.0 - 17.0 g/dL 02/19/2022  62.8  31.5   Hematocrit 39.0 - 52.0 % 37.5  35.6  40.0   Platelets 150 - 400 K/uL 140  126  166       Latest Ref Rng & Units 12/20/2021    4:11 AM 12/19/2021    4:25 PM 11/29/2021    9:36 PM  CMP  Glucose 70 - 99 mg/dL 96  98  99   BUN 6 - 20 mg/dL 14  12  17    Creatinine 0.61 - 1.24 mg/dL 12/01/2021   1.60   Sodium 135 - 145 mmol/L 138  138  143   Potassium 3.5 - 5.1 mmol/L 3.6  4.0  3.7   Chloride 98 - 111 mmol/L 109  106  110   CO2 22 - 32 mmol/L 25  24  27    Calcium 8.9 - 10.3 mg/dL 8.3  8.7  8.8   Total Protein 6.5 - 8.1 g/dL 6.6  7.3    Total Bilirubin 0.3 - 1.2 mg/dL 1.1  1.2    Alkaline Phos 38 - 126 U/L 51  57    AST 15 - 41 U/L 26  26    ALT 0 - 44 U/L 21  24      Imaging studies: No new pertinent imaging studies   Assessment/Plan:  56 y.o. male with acute cholecystitis, complicated by pertinent  comorbidities including A-fib, history of aortic dissection status post grafting and aortic valve replacement on chronic Coumadin.  Acute cholecystitis -Confirmed by HIDA scan -Patient high risk for cholecystectomy due to onset of pain 6 days before admission, on Coumadin with therapeutic INR and significant cardiac disease. -Patient is responding adequately to IV antibiotic therapy.  The pain has almost resolved.  There is no tenderness on palpation today. -We will advance diet to full liquids -Continue IV antibiotic therapy -We will assess diet toleration -Continue Coumadin on hold in case patient needs any intervention during admission.  Still possibility that if he does not respond to antibiotic therapy he might need percutaneous cholecystostomy. -I encouraged the patient to ambulate  Appreciate hospitalist medical management of significant chronic medical comorbidities.  1.06, MD

## 2021-12-21 NOTE — Progress Notes (Signed)
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  Tolerating food.  No further abdominal pain.  Consultants:  surgery  Procedures:   Antimicrobials:      Subjective: Denies shortness of breath, chest pain, nausea vomiting Objective: Vitals:   12/20/21 2320 12/21/21 0500 12/21/21 0734 12/21/21 1153  BP: 103/67 (!) 102/57 111/63 120/69  Pulse: (!) 48 (!) 44 (!) 45 (!) 48  Resp: 20 15 17 18   Temp: 98.1 F (36.7 C) 97.6 F (36.4 C)    TempSrc: Oral Oral    SpO2: 98% 96% 96% 100%  Weight:      Height:        Intake/Output Summary (Last 24 hours) at 12/21/2021 1536 Last data filed at 12/21/2021 1152 Gross per 24 hour  Intake 337.29 ml  Output 250 ml  Net 87.29 ml   Filed Weights   12/19/21 1623  Weight: 80 kg    Examination: Calm, NAD Cta no w/r Reg s1/s2 no gallop Soft benign +bs No edema Aaoxox3  Mood and affect appropriate in current setting     Data Reviewed: I have personally reviewed following labs and imaging studies  CBC: Recent Labs  Lab 12/19/21 1625 12/20/21 0411 12/21/21 0923  WBC 5.8 5.1 4.7  HGB 13.4 12.1* 12.7*  HCT 40.0 35.6*  37.5*  MCV 93.7 93.4 92.8  PLT 166 126* 140*   Basic Metabolic Panel: Recent Labs  Lab 12/19/21 1625 12/20/21 0411  NA 138 138  K 4.0 3.6  CL 106 109  CO2 24 25  GLUCOSE 98 96  BUN 12 14  CREATININE 1.03 1.04  CALCIUM 8.7* 8.3*   GFR: Estimated Creatinine Clearance: 82.3 mL/min (by C-G formula based on SCr of 1.04 mg/dL). Liver Function Tests: Recent Labs  Lab 12/19/21 1625 12/20/21 0411  AST 26 26  ALT 24 21  ALKPHOS 57 51  BILITOT 1.2 1.1  PROT 7.3 6.6  ALBUMIN 4.3 3.7   Recent Labs  Lab 12/19/21 1625  LIPASE 48   No results for input(s): "AMMONIA" in the last 168 hours. Coagulation Profile: Recent Labs  Lab 12/19/21 2153 12/20/21 0411 12/21/21 0532  INR 2.4* 2.4* 2.6*   Cardiac Enzymes: No results for input(s): "CKTOTAL", "CKMB", "CKMBINDEX", "TROPONINI" in the last 168 hours. BNP (last 3 results) No results for input(s): "PROBNP" in the last 8760 hours. HbA1C: No results for input(s): "HGBA1C" in the last 72 hours. CBG: No results for input(s): "GLUCAP" in the last 168 hours. Lipid Profile: No results for input(s): "CHOL", "HDL", "LDLCALC", "TRIG", "CHOLHDL", "LDLDIRECT" in the last 72 hours. Thyroid Function Tests: No results for input(s): "TSH", "T4TOTAL", "FREET4", "T3FREE", "THYROIDAB" in the last 72 hours. Anemia Panel: No results for input(s): "VITAMINB12", "FOLATE", "FERRITIN", "TIBC", "IRON", "RETICCTPCT"  in the last 72 hours. Sepsis Labs: No results for input(s): "PROCALCITON", "LATICACIDVEN" in the last 168 hours.  No results found for this or any previous visit (from the past 240 hour(s)).       Radiology Studies: NM Hepatobiliary Liver Func  Result Date: 12/20/2021 CLINICAL DATA:  Evaluate for acute cholecystitis. EXAM: NUCLEAR MEDICINE HEPATOBILIARY IMAGING TECHNIQUE: Sequential images of the abdomen were obtained out to 60 minutes following intravenous administration of radiopharmaceutical. RADIOPHARMACEUTICALS:  5.25 mCi Tc-87m   Choletec IV COMPARISON:  CT from 12/20/2021 and gallbladder sonogram from 12/19/2021. A. FINDINGS: Prompt uptake and biliary excretion of activity by the liver is seen. Biliary activity passes into small bowel, consistent with patent common bile duct. After 1 hour of imaging no gallbladder activity was visualized. Imaging was performed for an additional 30 minutes without gallbladder activity. IMPRESSION: 1. Nonvisualization of the gallbladder compatible with cholecystitis. 2. Patent common bile duct. Electronically Signed   By: Signa Kell M.D.   On: 12/20/2021 11:39   CT ABDOMEN PELVIS WO CONTRAST  Result Date: 12/20/2021 CLINICAL DATA:  Right-sided abdominal pain for several days, initial encounter EXAM: CT ABDOMEN AND PELVIS WITHOUT CONTRAST TECHNIQUE: Multidetector CT imaging of the abdomen and pelvis was performed following the standard protocol without IV contrast. RADIATION DOSE REDUCTION: This exam was performed according to the departmental dose-optimization program which includes automated exposure control, adjustment of the mA and/or kV according to patient size and/or use of iterative reconstruction technique. COMPARISON:  Ultrasound from the previous day, by report with CT from 11/09/2020. FINDINGS: Lower chest: No acute abnormality. Hepatobiliary: Fatty infiltration of the liver is noted. The gallbladder shows evidence of cholelithiasis similar to that seen on prior ultrasound. No pericholecystic fluid or inflammatory change is seen. Pancreas: Unremarkable. No pancreatic ductal dilatation or surrounding inflammatory changes. Spleen: Normal in size without focal abnormality. Adrenals/Urinary Tract: Adrenal glands are within normal limits. Kidneys show no renal calculi or obstructive changes. Small hypodense exophytic lesions are noted likely representing cysts. No follow-up up is recommended. No obstructive changes are seen. The bladder is well distended. Stomach/Bowel: Scattered diverticular  change of the colon is noted. No obstructive changes are seen. The appendix is within normal limits. Small bowel and stomach are within normal limits. Vascular/Lymphatic: Dilatation of the abdominal aorta to 4.3 cm is noted. Chronic changes of dissection are seen. By report this is stable from prior exam of 2022. Evaluation is limited due to lack of IV contrast. Reproductive: Prostate is within normal limits. Other: There is an area of calcification centrally within the mesentery best seen on image 39 of series 2. Some surrounding inflammatory change in the mesentery is noted. No free fluid is noted. Bilateral fat containing inguinal hernias are noted left greater than right. Musculoskeletal: No acute or significant osseous findings. IMPRESSION: Cholelithiasis without complicating factors. 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 Aneurysmal dilatation of the abdominal aorta to 4.3 cm is noted. Areas of chronic dissection are seen. These are stable in appearance when compared with prior CT by report from 11/09/2020. Recommend follow-up every 12 months and vascular consultation. This recommendation follows ACR consensus guidelines: White Paper of the ACR Incidental Findings Committee II on Vascular Findings. J Am Coll Radiol 2013; 10:789-794. Area of calcification in the mesentery with some surrounding inflammatory change also stable in appearance from prior CT examination from May of 2022. This is likely related to chronic scarring and possible fat necrosis. Electronically Signed   By: Alcide Clever M.D.   On: 12/20/2021 00:30  Scheduled Meds:  aspirin EC  81 mg Oral Daily   lisinopril  10 mg Oral Daily   pravastatin  20 mg Oral q1800   sodium chloride flush  3 mL Intravenous Q12H   Continuous Infusions:  sodium chloride 50 mL/hr at 12/20/21 1742   piperacillin-tazobactam (ZOSYN)  IV 3.375 g (12/21/21 1311)    Assessment & Plan:   Principal Problem:   RUQ pain Active Problems:    Paroxysmal atrial fibrillation (HCC)   Chronic kidney disease (CKD), stage II (mild)   Dissecting aortic aneurysm, thoracoabdominal (HCC)   History of heart valve replacement with mechanical valve   Open angle with borderline findings, low risk, bilateral   1.RUQ Hida with cholecystitis Holding Coumadin in case needs surgical intervention if fails conservative therapy General surgery following Continue IV antibiotics therapy and if he responds, he should be evaluated for delayed cholecystectomy in 6 to 8 weeks after episode.  If fails recommend percutaneous cholecystectomy by IR 7/9 pain resolved Advance diet to full liquids We will assess diet toleration Continue IV antibiotics Continue to hold Coumadin in case patient needs any intervention during admission . Ambulate   2. PAF In sinus On coumadin..>will hold in case will need surgery  Once inr <2, will bridge with heparin 7/9 INR 2.6 likely due to antibiotics Continue to hold Coumadin   3. History of heart valve replacement with mechanical valve On coumadin As above   4. Thrombocytopenia Platelets had dropped, likely from infection Now improving Continue to monitor   5.Dissecting aortic aneurysm, thoracoabdominal (HCC) Chronic Dc hydralazine as it increases shear stress. Followed by Dr. End-cardiology Keep bp on lower side Currently without pain. Monitor closely    DVT prophylaxis: on coumadin Code Status:full Family Communication: none at bedside Disposition Plan: back home Status is: Inpatient Remains inpatient appropriate because: iv treatment      LOS: 2 days   Time spent: 35 min    Nolberto Hanlon, MD [facility] Pager [phone]  If 7PM-7AM, please contact night-coverage 12/21/2021, 3:36 PM

## 2021-12-22 ENCOUNTER — Ambulatory Visit: Payer: Medicaid Other | Admitting: Nurse Practitioner

## 2021-12-22 DIAGNOSIS — R1011 Right upper quadrant pain: Secondary | ICD-10-CM | POA: Diagnosis not present

## 2021-12-22 LAB — PROTIME-INR
INR: 2.4 — ABNORMAL HIGH (ref 0.8–1.2)
Prothrombin Time: 26 seconds — ABNORMAL HIGH (ref 11.4–15.2)

## 2021-12-22 MED ORDER — AMOXICILLIN-POT CLAVULANATE 875-125 MG PO TABS: 1.0000 | ORAL_TABLET | Freq: Two times a day (BID) | ORAL | 0 refills | Status: AC

## 2021-12-22 MED ORDER — PIPERACILLIN-TAZOBACTAM 3.375 G IVPB 30 MIN
3.3750 g | Freq: Once | INTRAVENOUS | Status: AC
  Administered 2021-12-22: 3.375 g via INTRAVENOUS
  Filled 2021-12-22: qty 50

## 2021-12-22 MED ORDER — AMOXICILLIN-POT CLAVULANATE 875-125 MG PO TABS: 1.0000 | ORAL_TABLET | Freq: Two times a day (BID) | ORAL | Status: DC

## 2021-12-22 NOTE — Discharge Summary (Signed)
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  General surgery was consulted.  Patient was started on IV antibiotics.  He was treated conservatively with IV antibiotics.  As he tolerated his feeding today he is stable to be discharged and cleared to go home by general surgery.   1.RUQ Acute cholecystitis Hida with cholecystitis Was started on IV antibiotics his pain resolved plan to continue transition to p.o. antibiotics follow-up with general surgery in 4 weeks for discussion of cholecystectomy  Tolerated diet prior to discharge  Patient was told to follow-up with primary care who manages his INR as his INR may increase due to being on antibiotics and will need to check it in 1 to 2 days.         2. PAF In sinus On coumadin. To f/u with pcp for close monitoring INR since on  antibiotics. Patient verbalizes an understanding. Will need to ck inr in one to two days   3. History of heart valve replacement with mechanical valve On coumadin As above     4. Thrombocytopenia Platelets had dropped, likely from infection Now improving F/u with pcp for blood work     5.Dissecting aortic aneurysm, thoracoabdominal (HCC) Chronic Followed by Dr. End-cardiology Bp control as outpatient        Discharge Diagnoses:  Principal Problem:   RUQ pain Active Problems:   Paroxysmal atrial fibrillation (HCC)   Chronic kidney disease (CKD), stage II (mild)   Dissecting aortic aneurysm, thoracoabdominal (HCC)   History of heart valve replacement with mechanical valve   Open angle with borderline findings, low risk, bilateral    Discharge Instructions  Discharge Instructions     Call MD for:  severe uncontrolled pain   Complete by: As directed    Call MD for:  temperature >100.4   Complete by: As directed    Diet - low sodium heart healthy   Complete by: As directed    Discharge instructions   Complete by: As directed    Follow-up with surgeon in 4 weeks   Increase activity slowly   Complete by: As directed       Allergies as of 12/22/2021       Reactions   Alitraq Rash   Iodinated Contrast Media Rash   Iodine Rash   CT dye CT dye CT dye CT dye        Medication List     STOP taking these medications    acetaminophen 500 MG tablet Commonly known  as: TYLENOL       TAKE these medications    amoxicillin-clavulanate 875-125 MG tablet Commonly known as: AUGMENTIN Take 1 tablet by mouth every 12 (twelve) hours for 14 days.   aspirin EC 81 MG tablet Take 81 mg by mouth daily.   High Potency Iron 65 MG Tabs Take 2 tablets by mouth daily.   lisinopril 10 MG tablet Commonly known as: ZESTRIL Take 1 tablet (10 mg total) by mouth daily.   lovastatin 20 MG tablet Commonly known as: MEVACOR Take 1 tablet (20 mg total) by mouth at  bedtime.   Potassium 99 MG Tabs Take 2 tablets by mouth every evening.   warfarin 5 MG tablet Commonly known as: COUMADIN Take as directed. If you are unsure how to take this medication, talk to your nurse or doctor. Original instructions: Take 0.5-1 tablets (2.5-5 mg total) by mouth daily at 4 PM. Take 5 mg Monday, Weds, Sat. 2.5 mg on all other days        Follow-up Information     Herbert Pun, MD Follow up in 4 week(s).   Specialty: General Surgery Why: Appointment on Tuesday 01/20/2022 at [place] information: [place] [address] [phone]         Jon Billings, NP. Go in 1 week(s).   Specialty: Nurse Practitioner Why: Appointment on Wednesday 12/31/2021 at 1:20pm Contact information: [place] [address] [phone]                Allergies  Allergen Reactions   Alitraq Rash   Iodinated Contrast Media Rash   Iodine Rash    CT dye CT dye  CT dye CT dye    Consultations: General surgery   Procedures/Studies: NM Hepatobiliary Liver Func  Result Date: 12/20/2021 CLINICAL DATA:  Evaluate for acute cholecystitis. EXAM: NUCLEAR MEDICINE HEPATOBILIARY IMAGING TECHNIQUE: Sequential images of the abdomen were obtained out to 60 minutes following intravenous administration of radiopharmaceutical. RADIOPHARMACEUTICALS:  5.25 mCi Tc-55m  Choletec IV COMPARISON:  CT from 12/20/2021 and gallbladder sonogram from 12/19/2021. A. FINDINGS: Prompt uptake and biliary excretion of activity by the liver is seen. Biliary activity passes into small bowel, consistent with patent common bile duct. After 1 hour of imaging no gallbladder activity was visualized. Imaging was performed for an additional 30 minutes without gallbladder activity. IMPRESSION: 1. Nonvisualization of the gallbladder compatible with cholecystitis. 2. Patent common bile duct. Electronically Signed   By: Kerby Moors M.D.   On: 12/20/2021 11:39    CT ABDOMEN PELVIS WO CONTRAST  Result Date: 12/20/2021 CLINICAL DATA:  Right-sided abdominal pain for several days, initial encounter EXAM: CT ABDOMEN AND PELVIS WITHOUT CONTRAST TECHNIQUE: Multidetector CT imaging of the abdomen and pelvis was performed following the standard protocol without IV contrast. RADIATION DOSE REDUCTION: This exam was performed according to the departmental dose-optimization program which includes automated exposure control, adjustment of the mA and/or kV according to patient size and/or use of iterative reconstruction technique. COMPARISON:  Ultrasound from the previous day, by report with CT from 11/09/2020. FINDINGS: Lower chest: No acute abnormality. Hepatobiliary: Fatty infiltration of the liver is noted. The gallbladder shows evidence of cholelithiasis similar to that seen on prior ultrasound. No pericholecystic fluid or inflammatory change is seen. Pancreas: Unremarkable. No pancreatic ductal dilatation or surrounding inflammatory changes. Spleen: Normal in size without focal abnormality. Adrenals/Urinary Tract: Adrenal glands are within normal limits. Kidneys show no renal calculi or obstructive changes. Small hypodense exophytic lesions are  noted likely representing cysts. No follow-up up is recommended. No obstructive changes are seen. The bladder is well distended. Stomach/Bowel: Scattered diverticular change of the colon is noted. No obstructive changes are seen. The appendix is within normal limits. Small bowel and stomach are within normal limits. Vascular/Lymphatic: Dilatation of the abdominal aorta to 4.3 cm is noted. Chronic changes of dissection are seen. By report this is stable from prior exam of 2022. Evaluation is limited due to lack of IV contrast. Reproductive: Prostate is within normal limits. Other: There is an area of calcification centrally within the mesentery best seen on image 39 of series 2. Some surrounding inflammatory change in the mesentery is  noted. No free fluid is noted. Bilateral fat containing inguinal hernias are noted left greater than right. Musculoskeletal: No acute or significant osseous findings. IMPRESSION: Cholelithiasis without complicating factors. 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 Aneurysmal dilatation of the abdominal aorta to 4.3 cm is noted. Areas of chronic dissection are seen. These are stable in appearance when compared with prior CT by report from 11/09/2020. Recommend follow-up every 12 months and vascular consultation. This recommendation follows ACR consensus guidelines: White Paper of the ACR Incidental Findings Committee II on Vascular Findings. J Am Coll Radiol 2013; 10:789-794. Area of calcification in the mesentery with some surrounding inflammatory change also stable in appearance from prior CT examination from May of 2022. This is likely related to chronic scarring and possible fat necrosis. Electronically Signed   By: Inez Catalina M.D.   On: 12/20/2021 00:30   US ABDOMEN LIMITED RUQ (LIVER/GB)  Result Date: 12/19/2021 CLINICAL DATA:  Right upper quadrant pain EXAM: ULTRASOUND ABDOMEN LIMITED RIGHT UPPER QUADRANT COMPARISON:  None Available. FINDINGS: Gallbladder: Borderline gallbladder wall thickening. There is a shadowing calculus measuring 1.4 cm and sludge. Positive sonographic Murphy sign. Common bile duct: Diameter: 0.5 cm, within normal limits Liver: No focal lesion identified. Diffusely increased parenchymal echogenicity. Portal vein is patent on color Doppler imaging with normal direction of blood flow towards the liver. Other: None. IMPRESSION: 1. Cholelithiasis with borderline gallbladder wall thickening and positive sonographic Murphy sign. Findings are indeterminate though could represent early acute cholecystitis. 2. Diffusely increased liver parenchymal echogenicity which is nonspecific but most commonly seen with hepatic steatosis. Electronically Signed   By: Audie Pinto M.D.   On:  12/19/2021 15:21   NM Myocar Multi W/Spect W/Wall Motion / EF  Result Date: 12/12/2021   Abnormal, potentially high risk pharmacologic myocardial perfusion stress test.   There is a large, fixed defect involving the inferior and inferolateral walls most consistent with scar but cannot rule out an element of artifact.   There is no evidence of significant ischemia.   Left ventricular systolic function is mildly to moderately reduced with basal and mid inferior hypokinesis (LVEF 37% by QGS, 50% by Siemens calculation).   Attenuation correction CT demonstates sequelae of aortic valve replacement and ascending aorta repair.  There is no significant coronary artery calcification.   Incidental note is made of aortic atherosclarosis and hepatic steatosis.   Transient ischemic dilation is noted (TID 1.35).  While non-specific, this can be seen with balanced ischemia.   DG Chest Portable 1 View  Result Date: 11/29/2021 CLINICAL DATA:  Left chest pain and shortness of breath. EXAM: PORTABLE CHEST 1 VIEW COMPARISON:  09/27/2021 FINDINGS: Postoperative changes in the mediastinum. Heart size and pulmonary vascularity are normal. Lungs are clear. No pleural effusions. No pneumothorax. Mediastinal contours appear intact. Calcification of the aorta. Degenerative changes in  the spine and shoulders. IMPRESSION: No active disease. Electronically Signed   By: Burman Nieves M.D.   On: 11/29/2021 22:23      Subjective: No pain.  No shortness of breath.  No nausea or vomiting  Discharge Exam: Vitals:   12/22/21 1000 12/22/21 1241  BP: 108/68 118/66  Pulse:  61  Resp:  14  Temp:  98.3 F (36.8 C)  SpO2:  98%   Vitals:   12/22/21 0630 12/22/21 0759 12/22/21 1000 12/22/21 1241  BP:  102/63 108/68 118/66  Pulse:  (!) 45  61  Resp:  14  14  Temp:  98.3 F (36.8 C)  98.3 F (36.8 C)  TempSrc:      SpO2:  96%  98%  Weight: 78.4 kg     Height:        General: Pt is alert, awake, not in acute  distress Cardiovascular: RRR, S1/S2 +, no rubs, no gallops Respiratory: CTA bilaterally, no wheezing, no rhonchi Abdominal: Soft, NT, ND, bowel sounds + Extremities: no edema, no cyanosis    The results of significant diagnostics from this hospitalization (including imaging, microbiology, ancillary and laboratory) are listed below for reference.     Microbiology: No results found for this or any previous visit (from the past 240 hour(s)).   Labs: BNP (last 3 results) No results for input(s): "BNP" in the last 8760 hours. Basic Metabolic Panel: Recent Labs  Lab 12/19/21 1625 12/20/21 0411  NA 138 138  K 4.0 3.6  CL 106 109  CO2 24 25  GLUCOSE 98 96  BUN 12 14  CREATININE 1.03 1.04  CALCIUM 8.7* 8.3*   Liver Function Tests: Recent Labs  Lab 12/19/21 1625 12/20/21 0411  AST 26 26  ALT 24 21  ALKPHOS 57 51  BILITOT 1.2 1.1  PROT 7.3 6.6  ALBUMIN 4.3 3.7   Recent Labs  Lab 12/19/21 1625  LIPASE 48   No results for input(s): "AMMONIA" in the last 168 hours. CBC: Recent Labs  Lab 12/19/21 1625 12/20/21 0411 12/21/21 0923  WBC 5.8 5.1 4.7  HGB 13.4 12.1* 12.7*  HCT 40.0 35.6* 37.5*  MCV 93.7 93.4 92.8  PLT 166 126* 140*   Cardiac Enzymes: No results for input(s): "CKTOTAL", "CKMB", "CKMBINDEX", "TROPONINI" in the last 168 hours. BNP: Invalid input(s): "POCBNP" CBG: No results for input(s): "GLUCAP" in the last 168 hours. D-Dimer No results for input(s): "DDIMER" in the last 72 hours. Hgb A1c No results for input(s): "HGBA1C" in the last 72 hours. Lipid Profile No results for input(s): "CHOL", "HDL", "LDLCALC", "TRIG", "CHOLHDL", "LDLDIRECT" in the last 72 hours. Thyroid function studies No results for input(s): "TSH", "T4TOTAL", "T3FREE", "THYROIDAB" in the last 72 hours.  Invalid input(s): "FREET3" Anemia work up No results for input(s): "VITAMINB12", "FOLATE", "FERRITIN", "TIBC", "IRON", "RETICCTPCT" in the last 72 hours. Urinalysis     Component Value Date/Time   COLORURINE YELLOW (A) 12/19/2021 1625   APPEARANCEUR CLEAR (A) 12/19/2021 1625   LABSPEC 1.017 12/19/2021 1625   PHURINE 5.0 12/19/2021 1625   GLUCOSEU NEGATIVE 12/19/2021 1625   HGBUR LARGE (A) 12/19/2021 1625   BILIRUBINUR NEGATIVE 12/19/2021 1625   KETONESUR NEGATIVE 12/19/2021 1625   PROTEINUR NEGATIVE 12/19/2021 1625   NITRITE NEGATIVE 12/19/2021 1625   LEUKOCYTESUR NEGATIVE 12/19/2021 1625   Sepsis Labs Recent Labs  Lab 12/19/21 1625 12/20/21 0411 12/21/21 0923  WBC 5.8 5.1 4.7   Microbiology No results found for this or any previous visit (from the past  240 hour(s)).   Time coordinating discharge: Over 30 minutes  SIGNED:   Lynn Ito, MD  [facility] 12/23/2021, 6:51 PM Pager   If 7PM-7AM, please contact night-coverage [email or web address] Password [email or web address]

## 2021-12-22 NOTE — Progress Notes (Signed)
Patient ID: Ryan [name], male   DOB: 12-Feb-1966, 56 y.o.   MRN: [id number]     SURGICAL PROGRESS NOTE   Hospital Day(s): 3.   Interval History: Patient seen and examined, no acute events or new complaints overnight. Patient reports feeling well.  He denies abdominal pain.  He tolerated full liquid diet without abdominal pain.  He has not had any fevers.  Vital signs in last 24 hours: [min-max] current  Temp:  [97.7 F (36.5 C)-98.5 F (36.9 C)] 98.3 F (36.8 C) (07/10 1241) Pulse Rate:  [45-61] 61 (07/10 1241) Resp:  [14-18] 14 (07/10 1241) BP: (102-123)/(63-72) 118/66 (07/10 1241) SpO2:  [95 %-99 %] 98 % (07/10 1241) Weight:  [78.4 kg] 78.4 kg (07/10 0630)     Height: 5\' 7"  (170.2 cm) Weight: 78.4 kg BMI (Calculated): 27.06   Physical Exam:  Constitutional: alert, cooperative and no distress  Respiratory: breathing non-labored at rest  Cardiovascular: regular rate and sinus rhythm  Gastrointestinal: soft, non-tender, and non-distended  Labs:     Latest Ref Rng & Units 12/21/2021    9:23 AM 12/20/2021    4:11 AM 12/19/2021    4:25 PM  CBC  WBC 4.0 - 10.5 K/uL 4.7  5.1  5.8   Hemoglobin 13.0 - 17.0 g/dL 02/19/2022  40.9  81.1   Hematocrit 39.0 - 52.0 % 37.5  35.6  40.0   Platelets 150 - 400 K/uL 140  126  166       Latest Ref Rng & Units 12/20/2021    4:11 AM 12/19/2021    4:25 PM 11/29/2021    9:36 PM  CMP  Glucose 70 - 99 mg/dL 96  98  99   BUN 6 - 20 mg/dL 14  12  17    Creatinine 0.61 - 1.24 mg/dL 12/01/2021   7.82   Sodium 135 - 145 mmol/L 138  138  143   Potassium 3.5 - 5.1 mmol/L 3.6  4.0  3.7   Chloride 98 - 111 mmol/L 109  106  110   CO2 22 - 32 mmol/L 25  24  27    Calcium 8.9 - 10.3 mg/dL 8.3  8.7  8.8   Total Protein 6.5 - 8.1 g/dL 6.6  7.3    Total Bilirubin 0.3 - 1.2 mg/dL 1.1  1.2    Alkaline Phos 38 - 126 U/L 51  57    AST 15 - 41 U/L 26  26    ALT 0 - 44 U/L 21  24      Imaging studies: No new pertinent imaging studies   Assessment/Plan:  56 y.o. male with acute  cholecystitis, complicated by pertinent comorbidities including A-fib, history of aortic dissection status post grafting and aortic valve replacement on chronic Coumadin.   Acute cholecystitis -Confirmed by HIDA scan -Patient high risk for cholecystectomy due to onset of pain 6 days before admission, on Coumadin with therapeutic INR and significant cardiac disease. -Patient is responding adequately to IV antibiotic therapy.  The pain has resolved.  There is no tenderness on palpation today. -He tolerated full liquid diet.  Patient advance to low-fat diet -If he tolerates a low-fat diet he can be discharged with oral antibiotic therapy -I will follow him in my office in 4 weeks for discussion of cholecystectomy -I encouraged the patient to ambulate   Appreciate hospitalist medical management of significant chronic medical comorbidities.  2.13, MD

## 2021-12-22 NOTE — Care Management (Signed)
  Transition of Care [facility]) Screening Note   Patient Details  Name: Ryan [name] Date of Birth: May 14, 1966   Transition of Care [facility]) CM/SW Contact:    Caryn Section, RN Phone Number: 12/22/2021, 2:16 PM    Transition of Care Department [facility]) has reviewed patient and no TOC needs have been identified at this time. We will continue to monitor patient advancement through interdisciplinary progression rounds. If new patient transition needs arise, please place a TOC consult.    Patient is in the process of applying for medicaid, stated he does not need assistance at this time.

## 2021-12-24 ENCOUNTER — Ambulatory Visit (INDEPENDENT_AMBULATORY_CARE_PROVIDER_SITE_OTHER): Payer: Medicaid Other

## 2021-12-24 DIAGNOSIS — Z952 Presence of prosthetic heart valve: Secondary | ICD-10-CM | POA: Diagnosis not present

## 2021-12-24 DIAGNOSIS — Z7901 Long term (current) use of anticoagulants: Secondary | ICD-10-CM | POA: Diagnosis not present

## 2021-12-24 DIAGNOSIS — Z5181 Encounter for therapeutic drug level monitoring: Secondary | ICD-10-CM

## 2021-12-24 LAB — POCT INR: INR: 1.9 — AB (ref 2.0–3.0)

## 2021-12-24 NOTE — Patient Instructions (Signed)
Description   Take 1.5 tablets today, then resume same dosage of Warfarin 1/2 tablet every day EXCEPT 1 tablet on MONDAYS, WEDNESDAYS & SATURDAYS. - Recheck INR in 1 week while on Augmentin

## 2021-12-25 ENCOUNTER — Encounter: Payer: Self-pay | Admitting: Emergency Medicine

## 2021-12-25 ENCOUNTER — Other Ambulatory Visit: Payer: Self-pay

## 2021-12-25 ENCOUNTER — Inpatient Hospital Stay
Admission: EM | Admit: 2021-12-25 | Discharge: 2021-12-29 | DRG: 445 | Disposition: A | Discharge status: Home / Self Care | Payer: Medicaid Other | Attending: Internal Medicine | Admitting: Internal Medicine

## 2021-12-25 DIAGNOSIS — Z8679 Personal history of other diseases of the circulatory system: Secondary | ICD-10-CM

## 2021-12-25 DIAGNOSIS — D509 Iron deficiency anemia, unspecified: Secondary | ICD-10-CM | POA: Diagnosis present

## 2021-12-25 DIAGNOSIS — N182 Chronic kidney disease, stage 2 (mild): Secondary | ICD-10-CM | POA: Diagnosis present

## 2021-12-25 DIAGNOSIS — E782 Mixed hyperlipidemia: Secondary | ICD-10-CM | POA: Diagnosis present

## 2021-12-25 DIAGNOSIS — G4733 Obstructive sleep apnea (adult) (pediatric): Secondary | ICD-10-CM | POA: Diagnosis present

## 2021-12-25 DIAGNOSIS — I5022 Chronic systolic (congestive) heart failure: Secondary | ICD-10-CM | POA: Diagnosis present

## 2021-12-25 DIAGNOSIS — K810 Acute cholecystitis: Secondary | ICD-10-CM | POA: Diagnosis present

## 2021-12-25 DIAGNOSIS — Z8673 Personal history of transient ischemic attack (TIA), and cerebral infarction without residual deficits: Secondary | ICD-10-CM

## 2021-12-25 DIAGNOSIS — Z952 Presence of prosthetic heart valve: Secondary | ICD-10-CM

## 2021-12-25 DIAGNOSIS — K8000 Calculus of gallbladder with acute cholecystitis without obstruction: Principal | ICD-10-CM | POA: Diagnosis present

## 2021-12-25 DIAGNOSIS — Z7901 Long term (current) use of anticoagulants: Secondary | ICD-10-CM

## 2021-12-25 DIAGNOSIS — Z7982 Long term (current) use of aspirin: Secondary | ICD-10-CM

## 2021-12-25 DIAGNOSIS — I5032 Chronic diastolic (congestive) heart failure: Secondary | ICD-10-CM | POA: Diagnosis present

## 2021-12-25 DIAGNOSIS — E785 Hyperlipidemia, unspecified: Secondary | ICD-10-CM | POA: Diagnosis present

## 2021-12-25 DIAGNOSIS — Z91041 Radiographic dye allergy status: Secondary | ICD-10-CM

## 2021-12-25 DIAGNOSIS — I480 Paroxysmal atrial fibrillation: Secondary | ICD-10-CM | POA: Diagnosis present

## 2021-12-25 DIAGNOSIS — I130 Hypertensive heart and chronic kidney disease with heart failure and stage 1 through stage 4 chronic kidney disease, or unspecified chronic kidney disease: Secondary | ICD-10-CM | POA: Diagnosis present

## 2021-12-25 DIAGNOSIS — I447 Left bundle-branch block, unspecified: Secondary | ICD-10-CM | POA: Diagnosis present

## 2021-12-25 DIAGNOSIS — R1011 Right upper quadrant pain: Principal | ICD-10-CM

## 2021-12-25 DIAGNOSIS — Z8249 Family history of ischemic heart disease and other diseases of the circulatory system: Secondary | ICD-10-CM

## 2021-12-25 DIAGNOSIS — Z79899 Other long term (current) drug therapy: Secondary | ICD-10-CM

## 2021-12-25 DIAGNOSIS — K760 Fatty (change of) liver, not elsewhere classified: Secondary | ICD-10-CM | POA: Diagnosis present

## 2021-12-25 DIAGNOSIS — Z888 Allergy status to other drugs, medicaments and biological substances status: Secondary | ICD-10-CM

## 2021-12-25 DIAGNOSIS — Z83438 Family history of other disorder of lipoprotein metabolism and other lipidemia: Secondary | ICD-10-CM

## 2021-12-25 DIAGNOSIS — K219 Gastro-esophageal reflux disease without esophagitis: Secondary | ICD-10-CM | POA: Diagnosis present

## 2021-12-25 HISTORY — DX: Acute cholecystitis: K81.0

## 2021-12-25 LAB — TROPONIN I (HIGH SENSITIVITY)
Troponin I (High Sensitivity): 30 ng/L — ABNORMAL HIGH (ref <18)
Troponin I (High Sensitivity): 31 ng/L — ABNORMAL HIGH (ref <18)

## 2021-12-25 LAB — COMPREHENSIVE METABOLIC PANEL
ALT: 21 U/L (ref 0–44)
AST: 30 U/L (ref 15–41)
Albumin: 4.2 g/dL (ref 3.5–5.0)
Alkaline Phosphatase: 61 U/L (ref 38–126)
Anion gap: 5 (ref 5–15)
BUN: 7 mg/dL (ref 6–20)
CO2: 25 mmol/L (ref 22–32)
Calcium: 8.6 mg/dL — ABNORMAL LOW (ref 8.9–10.3)
Chloride: 106 mmol/L (ref 98–111)
Creatinine, Ser: 1.06 mg/dL (ref 0.61–1.24)
GFR, Estimated: >60 mL/min (ref >60)
Glucose, Bld: 113 mg/dL — ABNORMAL HIGH (ref 70–99)
Potassium: 3.6 mmol/L (ref 3.5–5.1)
Sodium: 136 mmol/L (ref 135–145)
Total Bilirubin: 0.8 mg/dL (ref 0.3–1.2)
Total Protein: 7.2 g/dL (ref 6.5–8.1)

## 2021-12-25 LAB — CBC
HCT: 38.9 % — ABNORMAL LOW (ref 39.0–52.0)
Hemoglobin: 13.1 g/dL (ref 13.0–17.0)
MCH: 31.6 pg (ref 26.0–34.0)
MCHC: 33.7 g/dL (ref 30.0–36.0)
MCV: 94 fL (ref 80.0–100.0)
Platelets: 156 10*3/uL (ref 150–400)
RBC: 4.14 MIL/uL — ABNORMAL LOW (ref 4.22–5.81)
RDW: 13.9 % (ref 11.5–15.5)
WBC: 5.8 10*3/uL (ref 4.0–10.5)
nRBC: 0 % (ref 0.0–0.2)

## 2021-12-25 LAB — PROTIME-INR
INR: 2 — ABNORMAL HIGH (ref 0.8–1.2)
Prothrombin Time: 22.9 seconds — ABNORMAL HIGH (ref 11.4–15.2)

## 2021-12-25 LAB — URINALYSIS, ROUTINE W REFLEX MICROSCOPIC
Bacteria, UA: NONE SEEN
Bilirubin Urine: NEGATIVE
Glucose, UA: NEGATIVE mg/dL
Ketones, ur: NEGATIVE mg/dL
Leukocytes,Ua: NEGATIVE
Nitrite: NEGATIVE
Protein, ur: NEGATIVE mg/dL
Specific Gravity, Urine: 1.004 — ABNORMAL LOW (ref 1.005–1.030)
Squamous Epithelial / HPF: NONE SEEN (ref 0–5)
pH: 6 (ref 5.0–8.0)

## 2021-12-25 LAB — LIPASE, BLOOD: Lipase: 71 U/L — ABNORMAL HIGH (ref 11–51)

## 2021-12-25 MED ORDER — DIPHENHYDRAMINE HCL 50 MG/ML IJ SOLN
50.0000 mg | Freq: Once | INTRAMUSCULAR | Status: AC
Start: 2021-12-26 — End: 2021-12-26

## 2021-12-25 MED ORDER — ONDANSETRON HCL 4 MG/2ML IJ SOLN
4.0000 mg | INTRAMUSCULAR | Status: AC
  Administered 2021-12-25: 4 mg via INTRAVENOUS
  Filled 2021-12-25: qty 2

## 2021-12-25 MED ORDER — ASPIRIN 81 MG PO TBEC
81.0000 mg | DELAYED_RELEASE_TABLET | Freq: Every day | ORAL | Status: DC
  Administered 2021-12-25 – 2021-12-29 (×4): 81 mg via ORAL
  Filled 2021-12-25 (×4): qty 1

## 2021-12-25 MED ORDER — ONDANSETRON HCL 4 MG PO TABS: 4.0000 mg | ORAL_TABLET | Freq: Four times a day (QID) | ORAL | Status: DC | PRN

## 2021-12-25 MED ORDER — SENNOSIDES-DOCUSATE SODIUM 8.6-50 MG PO TABS: 1.0000 | ORAL_TABLET | Freq: Every evening | ORAL | Status: DC | PRN

## 2021-12-25 MED ORDER — PREDNISONE 20 MG PO TABS
50.0000 mg | ORAL_TABLET | Freq: Four times a day (QID) | ORAL | Status: AC
  Administered 2021-12-25 – 2021-12-26 (×3): 50 mg via ORAL
  Filled 2021-12-25 (×3): qty 1

## 2021-12-25 MED ORDER — DIPHENHYDRAMINE HCL 25 MG PO CAPS
50.0000 mg | ORAL_CAPSULE | Freq: Once | ORAL | Status: AC
Start: 2021-12-26 — End: 2021-12-26
  Administered 2021-12-26: 50 mg via ORAL
  Filled 2021-12-25: qty 2

## 2021-12-25 MED ORDER — PIPERACILLIN-TAZOBACTAM 3.375 G IVPB 30 MIN
3.3750 g | Freq: Once | INTRAVENOUS | Status: AC
  Administered 2021-12-25: 3.375 g via INTRAVENOUS
  Filled 2021-12-25: qty 50

## 2021-12-25 MED ORDER — PRAVASTATIN SODIUM 20 MG PO TABS
20.0000 mg | ORAL_TABLET | Freq: Every day | ORAL | Status: DC
  Administered 2021-12-25 – 2021-12-28 (×4): 20 mg via ORAL
  Filled 2021-12-25 (×4): qty 1

## 2021-12-25 MED ORDER — LACTATED RINGERS IV SOLN: INTRAVENOUS | Status: DC

## 2021-12-25 MED ORDER — PIPERACILLIN-TAZOBACTAM 3.375 G IVPB
3.3750 g | Freq: Three times a day (TID) | INTRAVENOUS | Status: DC
  Administered 2021-12-25 – 2021-12-29 (×12): 3.375 g via INTRAVENOUS
  Filled 2021-12-25 (×12): qty 50

## 2021-12-25 MED ORDER — BISACODYL 5 MG PO TBEC: 5.0000 mg | DELAYED_RELEASE_TABLET | Freq: Every day | ORAL | Status: DC | PRN

## 2021-12-25 MED ORDER — ONDANSETRON HCL 4 MG/2ML IJ SOLN: 4.0000 mg | Freq: Four times a day (QID) | INTRAMUSCULAR | Status: DC | PRN

## 2021-12-25 NOTE — Assessment & Plan Note (Signed)
No acute issues. Continue aspirin and statin 

## 2021-12-25 NOTE — ED Triage Notes (Signed)
Pt arrived via [facility] from home with RUQ abd pain since 0130. Pt seen 12/19/21 and diagnosed with Coleocystitis and directed to follow up with general surgery on 8/8 and return to ED if pain becomes intolerable before. Pt also reports N/V/D x1 week.

## 2021-12-25 NOTE — Assessment & Plan Note (Addendum)
Diagnosed during prior admission last week.  Presented with worsening right upper quadrant pain. Percutaneous biliary drain placed by IR on 7/14 after INR was reversed with FFP and vitamin K -- General surgery, interventional radiology consulted -- Resumed on diet tolerating -- Continue IV Zosyn -- Continue IV fluids given worsening renal function -- Monitor abdominal exam, fever curve, and for signs of obstructive jaundice -- Daily CMP -- Ongoing bleeding from biliary tube, repeat CBC this afternoon prior to resuming anticoagulation

## 2021-12-25 NOTE — Assessment & Plan Note (Signed)
Due to acute cholecystitis.  Management as outlined.

## 2021-12-25 NOTE — Assessment & Plan Note (Addendum)
Heart rate controlled on admission.  Appears not on rate control agents.  Coumadin on hold for IR procedure.  Monitor on telemetry.  Will bridge with IV heparin given mechanical aortic valve after IR procedure today.

## 2021-12-25 NOTE — ED Notes (Signed)
See triage note  Presents with abd pain this am.  States he was seen last week for same.States he was dx'd with gallstones and is scheduled to see a MD in about 1-2 weeks

## 2021-12-25 NOTE — Assessment & Plan Note (Signed)
Appears euvolemic and compensated on admission.  Echo in March 2023 showed EF 50 to 55%, mild LVH, grade 2 diastolic dysfunction, mild MR. -- Monitor volume status -- ACE inhibitor on hold due to soft BP

## 2021-12-25 NOTE — Consult Note (Signed)
Pharmacy Antibiotic Note  Ryan [name] is a 56 y.o. 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  Pharmacy has been consulted for Zosyn dosing.  Plan: Zosyn 3.375g IV x1 ( inf) in ED; f/b Zosyn 3.375g IV q8h (ext'd inf)  Height: 5\' 7"  (170.2 cm) Weight: 78.3 kg (172 lb 9.9 oz) IBW/kg (Calculated) : 66.1  Temp (24hrs), Avg:98.3 F (36.8 C), Min:98.3 F (36.8 C), Max:98.3 F (36.8 C)  Recent Labs  Lab 12/19/21 1625 12/20/21 0411 12/21/21 0923 12/25/21 0349  WBC 5.8 5.1 4.7 5.8  CREATININE 1.03 1.04  --  1.06    Estimated Creatinine Clearance: 74.5 mL/min (by C-G formula based on SCr of 1.06 mg/dL).    Allergies  Allergen Reactions   Alitraq Rash   Iodinated Contrast Media Rash   Iodine Rash    CT dye CT dye  CT dye CT dye    Antimicrobials this admission: ZOS (7/13 >>   Dose adjustments this admission: CTM and adjust PRN  Microbiology results: None  Thank you for allowing pharmacy to be a part of this patient's care.  03-10-1972 12/25/2021 9:13 AM

## 2021-12-25 NOTE — Assessment & Plan Note (Signed)
No acute issues. Diligent BP control.

## 2021-12-25 NOTE — Assessment & Plan Note (Signed)
Renal function stable. Monitor BMP. 

## 2021-12-25 NOTE — Assessment & Plan Note (Signed)
Continue statin. 

## 2021-12-25 NOTE — H&P (Addendum)
History and Physical    Patient: Ryan [name] NLZ:[id number] DOB: 05-03-1966 DOA: 12/25/2021 DOS: the patient was seen and examined on 12/25/2021 PCP: Larae Grooms, NP  Patient coming from: Home  Chief Complaint:  Chief Complaint  Patient presents with   Abdominal Pain   HPI: Ryan [name] is a 56 y.o. 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  He presented to the ED on early morning on 12/25/2021 with worsening RUQ abdominal pain.  Patient had been admitted at [facility] from 7/7--12/22/2021 for similar symptoms and was diagnosed with acute cholecystitis.  General surgery at that time recommended conservative management, IV antibiotics, and consideration of surgery in 4 weeks on elective basis due to patient's complicating co-morbid conditions.  Yesterday evening, patient ate a roast beef sandwich from [employer] around 6 PM.  He woke at 1 AM with significantly worsened right upper quadrant pain radiating to the left shoulder arm and back.  He denied fevers or chills.  Denies diarrhea.  Reports dark stools but takes oral iron, no red blood seen in stool.  ED course: Patient was afebrile and with stable vital signs.  CMP notable for glucose 113, calcium 8.6.  Lipase mildly elevated at 71.  Troponin 30 >> 31.  INR 2.0.  CBC with no leukocytosis, normal platelets and hemoglobin.  EDP discussed with general surgery who saw the patient.  They recommended admission for IV antibiotics and discuss with interventional radiology regarding placement of a percutaneous biliary drain for management of acute cholecystitis.     Review of Systems: As mentioned in the history of present illness. All  other systems reviewed and are negative. Past Medical History:  Diagnosis Date   Anemia    Aortic aneurysm and dissection (HCC)    a. 2001 s/p grafting and AVR; b. 09/2021 CTA Chest: Dil Ao root up to 71mm. Ao arch 60mm. Desc Ao 34mm. Complicated arch dissection w/ mult false and/or partially thrombosed lumens. Dissection flaps extend into all great vessels except L carotid. Dissection cont into R CCA and into abd->L RA.   Chronic anticoagulation    Dissecting aortic aneurysm, thoracoabdominal (HCC)    Fatty liver    GERD (gastroesophageal reflux disease)    H/O mechanical aortic valve replacement    a. 2001 s/p AVR in setting of Asc Ao aneurysm repair; b. 08/2021 Echo: EF 50-55%, no rwma, mild LVH, GrII DD, nl RV fxn, mild MR, triv AI w/ mean AoV grad .   Heart murmur    History of cardiac catheterization    a. 08/2019 MV: basal-dist lateral and inflat defect; b. 07/2020 Cath: nl cors.   Hyperlipidemia    Hypertension    Hypolipidemia    Iron deficiency    PAF (paroxysmal atrial fibrillation) (HCC)    a. CHA2DS2VASc = 3.  Chronic warfarin (also mech AVR).   Pulmonary nodule    Sleep apnea    Stroke [facility])    Past Surgical History:  Procedure Laterality Date   ABDOMINAL SURGERY     AORTIC VALVE REPLACEMENT  2001   [employer] Mechanical   ARTHROSCOPIC REPAIR ACL     COLONOSCOPY  2019   MENISCUS REPAIR     Social History:  reports that he has never  smoked. He has been exposed to tobacco smoke. He has never used smokeless tobacco. He reports current alcohol use. He reports that he does not use drugs.  Allergies  Allergen Reactions   Alitraq Rash   Iodinated Contrast Media Rash   Iodine Rash    CT dye CT dye  CT dye CT dye    Family History  Problem Relation Age of Onset   Heart attack Mother    Hypertension Mother    Hyperlipidemia Mother    Heart disease Mother    Heart attack Maternal Grandfather     Prior to Admission medications   Medication Sig Start Date End  Date Taking? Authorizing Provider  amoxicillin-clavulanate (AUGMENTIN) 875-125 MG tablet Take 1 tablet by mouth every 12 (twelve) hours for 14 days. 12/22/21 01/05/22  Nolberto Hanlon, MD  aspirin 81 MG EC tablet Take 81 mg by mouth daily.    [provider]  Ferrous Sulfate Dried (HIGH POTENCY IRON) 65 MG TABS Take 2 tablets by mouth daily. 08/13/21   [provider]  lisinopril (ZESTRIL) 10 MG tablet Take 1 tablet (10 mg total) by mouth daily. 08/15/21 02/11/22  End, Harrell Gave, MD  lovastatin (MEVACOR) 20 MG tablet Take 1 tablet (20 mg total) by mouth at bedtime. 10/20/21   Jon Billings, NP  Potassium 99 MG TABS Take 2 tablets by mouth every evening.    [provider]  warfarin (COUMADIN) 5 MG tablet Take 0.5-1 tablets (2.5-5 mg total) by mouth daily at 4 PM. Take 5 mg Monday, Weds, Sat. 2.5 mg on all other days 12/10/21   End, Harrell Gave, MD    Physical Exam: Vitals:   12/25/21 0730 12/25/21 0736 12/25/21 1305 12/25/21 1548  BP:  110/70 (!) 106/58 (!) 103/58  Pulse:  70 (!) 48 (!) 44  Resp:  18 16 16   Temp:   97.7 F (36.5 C) 97.9 F (36.6 C)  TempSrc:   Oral   SpO2:  98% 97% 98%  Weight: 78.3 kg     Height: 5\' 7"  (1.702 m)      General exam: awake, alert, no acute distress HEENT: atraumatic, clear conjunctiva, anicteric sclera, moist mucus membranes, hearing grossly normal  Respiratory system: CTAB no wheezes, rales or rhonchi, normal respiratory effort. Cardiovascular system: normal S1/S2, RRR, no peripheral edema, mechanical valve click noted.   Gastrointestinal system: Abdomen is mildly distended with epigastric and right upper quadrant tenderness on palpation without guarding or rebound,no HSM felt, +bowel sounds. Central nervous system: A&O x3. no gross focal neurologic deficits, normal speech Extremities: moves all, no edema, normal tone Skin: dry, intact, normal temperature, normal color, No rashes, lesions or ulcers seen on visualized  skin Psychiatry: normal mood, congruent affect, judgement and insight appear normal  Data Reviewed:  CMP notable for glucose 113, calcium 8.6.  Lipase mildly elevated at 71.  Troponin 30 >> 31.  INR 2.0.  CBC with no leukocytosis, normal platelets and hemoglobin.  Urinalysis was negative for signs of infection, showed moderate hemoglobin.  EKG was normal sinus rhythm at 61 bpm, LBBB, no acute ischemic changes  Assessment and Plan: * Acute cholecystitis Diagnosed during prior admission last week.  Presented with worsening right upper quadrant pain. -- General surgery following -- IR consulted for placement of PERC biliary drain -- INR needs to be less than 1.5 for procedure -- Hold Coumadin, INR daily -- Resume diet as tolerated for now, will keep n.p.o. after midnight is once INR goal -- Continue IV Zosyn --  Continue IV fluids -- Monitor abdominal exam, fever curve, and for signs of obstructive jaundice -- Daily CMP  RUQ pain Due to acute cholecystitis.  Management as outlined.  Paroxysmal atrial fibrillation (HCC) Heart rate controlled on admission.  Appears not on rate control agents.  Coumadin on hold for IR procedure.  Monitor on telemetry  Chronic heart failure with preserved ejection fraction (HFpEF) (HCC) Appears euvolemic and compensated on admission.  Echo in March 2023 showed EF 50 to 55%, mild LVH, grade 2 diastolic dysfunction, mild MR. -- Monitor volume status -- ACE inhibitor on hold due to soft BP  History of CVA (cerebrovascular accident) No acute issues. Continue aspirin and statin.  Hx of repair of dissecting thoracic aortic aneurysm, Stanford type A No acute issues. Diligent BP control.  Hyperlipidemia Continue statin  History of heart valve replacement with mechanical valve INR 2.0 on admission.  Interventional radiology needs INR less than 1.5 for placement of percutaneous biliary drain. -- Hold Coumadin -- Daily INR's  Chronic kidney disease  (CKD), stage II (mild) Renal function stable.  Monitor BMP      Advance Care Planning:   Code Status: Full Code    Consults: General surgery, interventional radiology  Family Communication: None  Severity of Illness: The appropriate patient status for this patient is OBSERVATION. Observation status is judged to be reasonable and necessary in order to provide the required intensity of service to ensure the patient's safety. The patient's presenting symptoms, physical exam findings, and initial radiographic and laboratory data in the context of their medical condition is felt to place them at decreased risk for further clinical deterioration. Furthermore, it is anticipated that the patient will be medically stable for discharge from the hospital within 2 midnights of admission.   Author: Pennie Banter, DO 12/25/2021 4:09 PM  For on call review [email or web address].

## 2021-12-25 NOTE — Consult Note (Signed)
PHARMACY -  BRIEF ANTIBIOTIC NOTE   Pharmacy has received consult(s) for Zosyn from an ED provider.  The patient's profile has been reviewed for ht/wt/allergies/indication/available labs.    One time order(s) placed for: Zosyn 3.375g x1 in ED  Further antibiotics/pharmacy consults should be ordered by admitting physician if indicated.                       Thank you, Martyn Malay 12/25/2021  8:05 AM

## 2021-12-25 NOTE — Assessment & Plan Note (Addendum)
INR 2.0 on admission.  Interventional radiology needs INR less than 1.5 for placement of percutaneous biliary drain. -- Hold Coumadin -- Daily INR's -- FFP and vitamin K given today per request of IR to proceed with drain placement -- Start heparin drip after procedure when okay with radiologist and continue until INR is again therapeutic

## 2021-12-25 NOTE — ED Provider Notes (Signed)
[facility] Provider Note    Event Date/Time   First MD Initiated Contact with Patient 12/25/21 0725     (approximate)   History   Abdominal Pain   HPI  Ryan [name] is a 56 y.o. male on review of discharge summary from July 10 past medical history of type a dissection status post emergent root repair and mechanical AVR in 2001 with a chronic left bundle branch block and chronic aortic dissection extending from the innominate artery through the iliac bifurcation, history of strokes TIAs, paroxysmal atrial fibrillation, hypertension, hyperlipidemia, obstructive sleep apnea, March 2023 echo showing 50 to 55% ejection fraction with mild LVH grade 2 diastolic dysfunction normal RV function mild MR, no right wall motion abnormality, iron deficiency anemia    Patient was admitted for cholecystitis treated with IV antibiotic     Physical Exam   Triage Vital Signs: ED Triage Vitals  Enc Vitals Group     BP 12/25/21 0348 121/73     Pulse Rate 12/25/21 0348 61     Resp 12/25/21 0348 16     Temp 12/25/21 0348 98.3 F (36.8 C)     Temp Source 12/25/21 0348 Oral     SpO2 12/25/21 0348 96 %     Weight 12/25/21 0730 172 lb 9.9 oz (78.3 kg)     Height 12/25/21 0730 5\' 7"  (1.702 m)     Head Circumference --      Peak Flow --      Pain Score --      Pain Loc --      Pain Edu? --      Excl. in GC? --     Most recent vital signs: Vitals:   12/25/21 0554 12/25/21 0736  BP: 109/69 110/70  Pulse: 68 70  Resp: 20 18  Temp:    SpO2: 98% 98%     General: Awake, no distress.  CV:  Good peripheral perfusion.  Resp:  Normal effort.  Clear bilateral Abd:  No distention.  Moderate focal tenderness right upper quadrant.  Positive Murphy.  Patient reports this reproduces pain Other:  Peripherally warm well perfused   ED Results / Procedures / Treatments   Labs (all labs ordered are listed, but only abnormal results are displayed) Labs Reviewed  LIPASE, BLOOD  - Abnormal; Notable for the following components:      Result Value   Lipase 71 (*)    All other components within normal limits  COMPREHENSIVE METABOLIC PANEL - Abnormal; Notable for the following components:   Glucose, Bld 113 (*)    Calcium 8.6 (*)    All other components within normal limits  CBC - Abnormal; Notable for the following components:   RBC 4.14 (*)    HCT 38.9 (*)    All other components within normal limits  URINALYSIS, ROUTINE W REFLEX MICROSCOPIC - Abnormal; Notable for the following components:   Color, Urine STRAW (*)    APPearance CLEAR (*)    Specific Gravity, Urine 1.004 (*)    Hgb urine dipstick MODERATE (*)    All other components within normal limits  TROPONIN I (HIGH SENSITIVITY) - Abnormal; Notable for the following components:   Troponin I (High Sensitivity) 30 (*)    All other components within normal limits  TROPONIN I (HIGH SENSITIVITY) - Abnormal; Notable for the following components:   Troponin I (High Sensitivity) 31 (*)    All other components within normal limits  PROTIME-INR  EKG  Interpreted by me at 4 AM heart rate 60 QRS 150 QTc 460 left bundle branch block.   RADIOLOGY     PROCEDURES:  Critical Care performed: No  Procedures   MEDICATIONS ORDERED IN ED: Medications  piperacillin-tazobactam (ZOSYN) IVPB 3.375 g (3.375 g Intravenous New Bag/Given 12/25/21 0832)  senna-docusate (Senokot-S) tablet 1 tablet (has no administration in time range)  bisacodyl (DULCOLAX) EC tablet 5 mg (has no administration in time range)  ondansetron (ZOFRAN) tablet 4 mg (has no administration in time range)    Or  ondansetron (ZOFRAN) injection 4 mg (has no administration in time range)  ondansetron (ZOFRAN) injection 4 mg (4 mg Intravenous Given 12/25/21 0832)     IMPRESSION / MDM / ASSESSMENT AND PLAN / ED COURSE  I reviewed the triage vital signs and the nursing notes.                              Differential diagnosis includes,  but is not limited to, ongoing cholecystitis, worsening cholecystitis, perforation, sepsis, ACS, etc.  Differential diagnosis broad but based on patient's presentation description it appears that he has recurrent pain of cholecystitis.  Consulted with our general surgeon, Dr. Maia Plan who after seeing the patient in the ER recommended admission to the hospital service to continue Zosyn and he will consider potentially having an IR drain  Patient's presentation is most consistent with acute presentation with potential threat to life or bodily function.  The patient is on the cardiac monitor to evaluate for evidence of arrhythmia and/or significant heart rate changes.  Labs interpreted as normal CBC and metabolic panel normal LFTs.  Troponin minimally elevated appears consistent with previous baselines for the patient with no evidence of ACS today   Admission discussed with and consulted with Dr. Esaw Grandchild hospitalist.  After discussion Dr. Denton Lank will admit with general surgery consulting patient understand agreeable with plan  Patient offered pain medication, he does not wish for any at this time.  FINAL CLINICAL IMPRESSION(S) / ED DIAGNOSES   Final diagnoses:  RUQ pain     Rx / DC Orders   ED Discharge Orders     None        Note:  This document was prepared using Dragon voice recognition software and may include unintentional dictation errors.   Sharyn Creamer, MD 12/25/21 [phone]

## 2021-12-25 NOTE — Consult Note (Signed)
Chief Complaint: Patient was seen in consultation today for cholecystitis  Referring Physician(s): Carolan Shiver, MD  Supervising Physician: Pernell Dupre  Patient Status: [facility] - In-pt  History of Present Illness: Ryan [name] is a 56 y.o. male with a past medical history significant for OSA, GERD, fatty liver, paroxysmal a.fib on Coumadin, CVA, TIA, HTN, HLD, aortic dissection s/p grafting and aortic valve replacement 2001 who presented to [facility] ED today with complaints of RUQ pain. [name] was seen for the same complaint on 12/19/21 and was diagnosed with acute cholecystitis (positive HIDA 12/20/21), however given his significant cardiac disease, chronic anticoagulation and time of presentation relative to onset of symptoms (6 days) he was deemed a poor surgical candidate at that time and oral antibiotics were recommended. He was discharged on 7/10 once his pain improved. He states that he took his antibiotics as prescribed and his pain initially improved however he ate [employer] last night and the pain returned so he came back to the ED. He was again seen by general surgery today for subacute cholecystitis and due to the longtime onset of cholecystitis and degree of inflammation he is not currently a surgical candidate, IR has been consulted for percutaneous cholecystostomy placement.   Patient seen on the floor, just arrived to his room. He states his pain is ok right now, he is hoping that this procedure will help get him to a point where he can have his gall bladder taken which he thinks will resolve the intermittent chest pain and abdominal pain he's been having. He understands the procedure and is agreeable to proceed.  Past Medical History:  Diagnosis Date   Anemia    Aortic aneurysm and dissection (HCC)    a. 2001 s/p grafting and AVR; b. 09/2021 CTA Chest: Dil Ao root up to 55mm. Ao arch 47mm. Desc Ao 47mm. Complicated arch dissection w/ mult false and/or partially thrombosed  lumens. Dissection flaps extend into all great vessels except L carotid. Dissection cont into R CCA and into abd->L RA.   Chronic anticoagulation    Dissecting aortic aneurysm, thoracoabdominal (HCC)    Fatty liver    GERD (gastroesophageal reflux disease)    H/O mechanical aortic valve replacement    a. 2001 s/p AVR in setting of Asc Ao aneurysm repair; b. 08/2021 Echo: EF 50-55%, no rwma, mild LVH, GrII DD, nl RV fxn, mild MR, triv AI w/ mean AoV grad .   Heart murmur    History of cardiac catheterization    a. 08/2019 MV: basal-dist lateral and inflat defect; b. 07/2020 Cath: nl cors.   Hyperlipidemia    Hypertension    Hypolipidemia    Iron deficiency    PAF (paroxysmal atrial fibrillation) (HCC)    a. CHA2DS2VASc = 3.  Chronic warfarin (also mech AVR).   Pulmonary nodule    Sleep apnea    Stroke [facility])     Past Surgical History:  Procedure Laterality Date   ABDOMINAL SURGERY     AORTIC VALVE REPLACEMENT  2001   [employer] Mechanical   ARTHROSCOPIC REPAIR ACL     COLONOSCOPY  2019   MENISCUS REPAIR      Allergies: Alitraq, Iodinated contrast media, and Iodine  Medications: Prior to Admission medications   Medication Sig Start Date End Date Taking? Authorizing Provider  amoxicillin-clavulanate (AUGMENTIN) 875-125 MG tablet Take 1 tablet by mouth every 12 (twelve) hours for 14 days. 12/22/21 01/05/22 Yes Lynn Ito, MD  aspirin 81 MG EC  tablet Take 81 mg by mouth daily.   Yes [provider]  Ferrous Sulfate Dried (HIGH POTENCY IRON) 65 MG TABS Take 2 tablets by mouth daily. 08/13/21  Yes [provider]  lisinopril (ZESTRIL) 10 MG tablet Take 1 tablet (10 mg total) by mouth daily. 08/15/21 02/11/22 Yes End, Cristal Deer, MD  lovastatin (MEVACOR) 20 MG tablet Take 1 tablet (20 mg total) by mouth at bedtime. 10/20/21  Yes Larae Grooms, NP  Potassium 99 MG TABS Take 2 tablets by mouth every evening.   Yes [provider]  warfarin (COUMADIN) 5 MG  tablet Take 0.5-1 tablets (2.5-5 mg total) by mouth daily at 4 PM. Take 5 mg Monday, Weds, Sat. 2.5 mg on all other days 12/10/21  Yes End, Cristal Deer, MD     Family History  Problem Relation Age of Onset   Heart attack Mother    Hypertension Mother    Hyperlipidemia Mother    Heart disease Mother    Heart attack Maternal Grandfather     Social History   Socioeconomic History   Marital status: Single    Spouse name: Not on file   Number of children: Not on file   Years of education: Not on file   Highest education level: Not on file  Occupational History   Not on file  Tobacco Use   Smoking status: Never    Passive exposure: Past   Smokeless tobacco: Never  Vaping Use   Vaping Use: Never used  Substance and Sexual Activity   Alcohol use: Yes    Comment: 2 mixed drinks per year   Drug use: Never   Sexual activity: Not Currently  Other Topics Concern   Not on file  Social History Narrative   Not on file   Social Determinants of Health   Financial Resource Strain: Not on file  Food Insecurity: Not on file  Transportation Needs: Not on file  Physical Activity: Not on file  Stress: Not on file  Social Connections: Not on file     Review of Systems: A 12 point ROS discussed and pertinent positives are indicated in the HPI above.  All other systems are negative.  Review of Systems  Constitutional:  Negative for chills and fever.  Respiratory:  Negative for cough and shortness of breath.   Cardiovascular:  Positive for chest pain (intermittent, mostly right side, none currently).  Gastrointestinal:  Positive for abdominal pain (RUQ, mild currently). Negative for diarrhea, nausea and vomiting.  Musculoskeletal:  Negative for back pain.  Neurological:  Negative for dizziness and headaches.    Vital Signs: BP (!) 106/58 (BP Location: Left Arm)   Pulse (!) 48   Temp 97.7 F (36.5 C) (Oral)   Resp 16   Ht 5\' 7"  (1.702 m)   Wt 172 lb 9.9 oz (78.3 kg)   SpO2 97%    BMI 27.04 kg/m   Physical Exam Vitals and nursing note reviewed.  Constitutional:      General: He is not in acute distress. HENT:     Head: Normocephalic.     Mouth/Throat:     Mouth: Mucous membranes are moist.     Pharynx: Oropharynx is clear. No oropharyngeal exudate or posterior oropharyngeal erythema.  Eyes:     General: No scleral icterus. Cardiovascular:     Rate and Rhythm: Regular rhythm. Bradycardia present.  Pulmonary:     Effort: Pulmonary effort is normal.     Breath sounds: Normal breath sounds.  Abdominal:  General: There is no distension.     Palpations: Abdomen is soft.     Tenderness: There is abdominal tenderness (RUQ).  Skin:    General: Skin is warm and dry.     Coloration: Skin is not jaundiced.  Neurological:     Mental Status: He is alert and oriented to person, place, and time.  Psychiatric:        Mood and Affect: Mood normal.        Behavior: Behavior normal.        Thought Content: Thought content normal.        Judgment: Judgment normal.      MD Evaluation Airway: WNL Heart: WNL Abdomen: WNL Chest/ Lungs: WNL ASA  Classification: 3 Mallampati/Airway Score: Two   Imaging: NM Hepatobiliary Liver Func  Result Date: 12/20/2021 CLINICAL DATA:  Evaluate for acute cholecystitis. EXAM: NUCLEAR MEDICINE HEPATOBILIARY IMAGING TECHNIQUE: Sequential images of the abdomen were obtained out to 60 minutes following intravenous administration of radiopharmaceutical. RADIOPHARMACEUTICALS:  5.25 mCi Tc-22m  Choletec IV COMPARISON:  CT from 12/20/2021 and gallbladder sonogram from 12/19/2021. A. FINDINGS: Prompt uptake and biliary excretion of activity by the liver is seen. Biliary activity passes into small bowel, consistent with patent common bile duct. After 1 hour of imaging no gallbladder activity was visualized. Imaging was performed for an additional 30 minutes without gallbladder activity. IMPRESSION: 1. Nonvisualization of the gallbladder  compatible with cholecystitis. 2. Patent common bile duct. Electronically Signed   By: Signa Kell M.D.   On: 12/20/2021 11:39   CT ABDOMEN PELVIS WO CONTRAST  Result Date: 12/20/2021 CLINICAL DATA:  Right-sided abdominal pain for several days, initial encounter EXAM: CT ABDOMEN AND PELVIS WITHOUT CONTRAST TECHNIQUE: Multidetector CT imaging of the abdomen and pelvis was performed following the standard protocol without IV contrast. RADIATION DOSE REDUCTION: This exam was performed according to the departmental dose-optimization program which includes automated exposure control, adjustment of the mA and/or kV according to patient size and/or use of iterative reconstruction technique. COMPARISON:  Ultrasound from the previous day, by report with CT from 11/09/2020. FINDINGS: Lower chest: No acute abnormality. Hepatobiliary: Fatty infiltration of the liver is noted. The gallbladder shows evidence of cholelithiasis similar to that seen on prior ultrasound. No pericholecystic fluid or inflammatory change is seen. Pancreas: Unremarkable. No pancreatic ductal dilatation or surrounding inflammatory changes. Spleen: Normal in size without focal abnormality. Adrenals/Urinary Tract: Adrenal glands are within normal limits. Kidneys show no renal calculi or obstructive changes. Small hypodense exophytic lesions are noted likely representing cysts. No follow-up up is recommended. No obstructive changes are seen. The bladder is well distended. Stomach/Bowel: Scattered diverticular change of the colon is noted. No obstructive changes are seen. The appendix is within normal limits. Small bowel and stomach are within normal limits. Vascular/Lymphatic: Dilatation of the abdominal aorta to 4.3 cm is noted. Chronic changes of dissection are seen. By report this is stable from prior exam of 2022. Evaluation is limited due to lack of IV contrast. Reproductive: Prostate is within normal limits. Other: There is an area of  calcification centrally within the mesentery best seen on image 39 of series 2. Some surrounding inflammatory change in the mesentery is noted. No free fluid is noted. Bilateral fat containing inguinal hernias are noted left greater than right. Musculoskeletal: No acute or significant osseous findings. IMPRESSION: Cholelithiasis without complicating factors. 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 Aneurysmal dilatation of the abdominal aorta to 4.3 cm is noted. Areas of chronic dissection are seen. These are  stable in appearance when compared with prior CT by report from 11/09/2020. Recommend follow-up every 12 months and vascular consultation. This recommendation follows ACR consensus guidelines: White Paper of the ACR Incidental Findings Committee II on Vascular Findings. J Am Coll Radiol 2013; 10:789-794. Area of calcification in the mesentery with some surrounding inflammatory change also stable in appearance from prior CT examination from May of 2022. This is likely related to chronic scarring and possible fat necrosis. Electronically Signed   By: Alcide Clever M.D.   On: 12/20/2021 00:30   US ABDOMEN LIMITED RUQ (LIVER/GB)  Result Date: 12/19/2021 CLINICAL DATA:  Right upper quadrant pain EXAM: ULTRASOUND ABDOMEN LIMITED RIGHT UPPER QUADRANT COMPARISON:  None Available. FINDINGS: Gallbladder: Borderline gallbladder wall thickening. There is a shadowing calculus measuring 1.4 cm and sludge. Positive sonographic Murphy sign. Common bile duct: Diameter: 0.5 cm, within normal limits Liver: No focal lesion identified. Diffusely increased parenchymal echogenicity. Portal vein is patent on color Doppler imaging with normal direction of blood flow towards the liver. Other: None. IMPRESSION: 1. Cholelithiasis with borderline gallbladder wall thickening and positive sonographic Murphy sign. Findings are indeterminate though could represent early acute cholecystitis. 2. Diffusely increased liver parenchymal  echogenicity which is nonspecific but most commonly seen with hepatic steatosis. Electronically Signed   By: Emmaline Kluver M.D.   On: 12/19/2021 15:21   NM Myocar Multi W/Spect W/Wall Motion / EF  Result Date: 12/12/2021   Abnormal, potentially high risk pharmacologic myocardial perfusion stress test.   There is a large, fixed defect involving the inferior and inferolateral walls most consistent with scar but cannot rule out an element of artifact.   There is no evidence of significant ischemia.   Left ventricular systolic function is mildly to moderately reduced with basal and mid inferior hypokinesis (LVEF 37% by QGS, 50% by Siemens calculation).   Attenuation correction CT demonstates sequelae of aortic valve replacement and ascending aorta repair.  There is no significant coronary artery calcification.   Incidental note is made of aortic atherosclarosis and hepatic steatosis.   Transient ischemic dilation is noted (TID 1.35).  While non-specific, this can be seen with balanced ischemia.   DG Chest Portable 1 View  Result Date: 11/29/2021 CLINICAL DATA:  Left chest pain and shortness of breath. EXAM: PORTABLE CHEST 1 VIEW COMPARISON:  09/27/2021 FINDINGS: Postoperative changes in the mediastinum. Heart size and pulmonary vascularity are normal. Lungs are clear. No pleural effusions. No pneumothorax. Mediastinal contours appear intact. Calcification of the aorta. Degenerative changes in the spine and shoulders. IMPRESSION: No active disease. Electronically Signed   By: Burman Nieves M.D.   On: 11/29/2021 22:23    Labs:  CBC: Recent Labs    12/19/21 1625 12/20/21 0411 12/21/21 0923 12/25/21 0349  WBC 5.8 5.1 4.7 5.8  HGB 13.4 12.1* 12.7* 13.1  HCT 40.0 35.6* 37.5* 38.9*  PLT 166 126* 140* 156    COAGS: Recent Labs    08/26/21 2036 08/27/21 0457 12/21/21 0532 12/22/21 0416 12/24/21 1359 12/25/21 0840  INR 2.3*   < > 2.6* 2.4* 1.9* 2.0*  APTT 38*  --   --   --   --   --    <  > = values in this interval not displayed.    BMP: Recent Labs    11/29/21 2136 12/19/21 1625 12/20/21 0411 12/25/21 0349  NA 143 138 138 136  K 3.7 4.0 3.6 3.6  CL 110 106 109 106  CO2 27 24 25  25  GLUCOSE 99 98 96 113*  BUN 17 12 14 7   CALCIUM 8.8* 8.7* 8.3* 8.6*  CREATININE 1.26* 1.03 1.04 1.06  GFRNONAA >60 >60 >60 >60    LIVER FUNCTION TESTS: Recent Labs    09/29/21 0905 12/19/21 1625 12/20/21 0411 12/25/21 0349  BILITOT 0.3 1.2 1.1 0.8  AST 17 26 26 30   ALT 14 24 21 21   ALKPHOS 66 57 51 61  PROT 6.0 7.3 6.6 7.2  ALBUMIN 3.8 4.3 3.7 4.2    TUMOR MARKERS: No results for input(s): "AFPTM", "CEA", "CA199", "CHROMGRNA" in the last 8760 hours.  Assessment and Plan:  56 y/o M with recently diagnosed acute cholecystitis who has failed outpatient management with antibiotics and is currently not a surgical candidate seen today for percutaneous cholecystostomy placement. Patient history and imaging reviewed by Dr. who approves procedure, however INR will need to be 1.5 or less before we can safely proceed (on chronic Coumadin for mechanical heart valve, INR 2.0 this morning). Hospitalist, general surgery and patient are aware.   We will tentatively plan for procedure tomorrow in IR pending AM INR results. Patient to be NPO (sips with meds) at midnight, hold anticoagulation until post procedure (if needed patient may be placed on heparin - if SQ will need to hold AM dose, if gtt will need to be held ~4 hours prior to procedure).   Patient also has a contrast allergy and will need to be pre-medicated for this procedure, we will tentatively plan on 0900 procedure time and if needed can extend the pre-medication regimen. I have placed orders for the following:  Prednisone 50 mg PO at 2000, 0200 and 0800 Benadryl 50 mg PO at 0800  Risks and benefits were discussed with the patient including, but not limited to, bleeding, infection, gallbladder perforation, bile leak,  sepsis or even death.  All of the patient's questions were answered, patient is agreeable to proceed.  Consent signed and in chart.  Thank you for this interesting consult.  I greatly enjoyed meeting PANAYIOTIS RAINVILLE and look forward to participating in their care.  A copy of this report was sent to the requesting provider on this date.  Electronically Signed: 59, PA-C 12/25/2021, 3:10 PM   I spent a total of 40 Minutes in face to face in clinical consultation, greater than 50% of which was counseling/coordinating care for percutaneous cholecystostomy placement.

## 2021-12-25 NOTE — Consult Note (Signed)
SURGICAL CONSULTATION NOTE   HISTORY OF PRESENT ILLNESS (HPI):  56 y.o. male presented to [facility] ED for evaluation of right upper quadrant pain. Patient reports he was doing well until yesterday evening that he went to [employer] after eating he started having right upper quadrant pain.  Patient has history of subacute cholecystitis treated with IV antibiotic therapy last week.  He responded well to IV antibiotic therapy and fluids yesterday.  During that admission he was confirmed to have acute cholecystitis due to HIDA scan nonvisualization of the gallbladder.  At the ED today he was found with normal white blood cell count.  Physical exam with tenderness to palpation in the right upper quadrant.  No repeating imaging has been done.  Clinically presented with acute cholecystitis, recurrent.  Surgery is consulted by Dr. Fanny Bien in this context for evaluation and management of recurrent acute cholecystitis.  PAST MEDICAL HISTORY (PMH):  Past Medical History:  Diagnosis Date   Anemia    Aortic aneurysm and dissection (HCC)    a. 2001 s/p grafting and AVR; b. 09/2021 CTA Chest: Dil Ao root up to 40mm. Ao arch 41mm. Desc Ao 62mm. Complicated arch dissection w/ mult false and/or partially thrombosed lumens. Dissection flaps extend into all great vessels except L carotid. Dissection cont into R CCA and into abd->L RA.   Chronic anticoagulation    Dissecting aortic aneurysm, thoracoabdominal (HCC)    Fatty liver    GERD (gastroesophageal reflux disease)    H/O mechanical aortic valve replacement    a. 2001 s/p AVR in setting of Asc Ao aneurysm repair; b. 08/2021 Echo: EF 50-55%, no rwma, mild LVH, GrII DD, nl RV fxn, mild MR, triv AI w/ mean AoV grad .   Heart murmur    History of cardiac catheterization    a. 08/2019 MV: basal-dist lateral and inflat defect; b. 07/2020 Cath: nl cors.   Hyperlipidemia    Hypertension    Hypolipidemia    Iron deficiency    PAF (paroxysmal atrial fibrillation) (HCC)     a. CHA2DS2VASc = 3.  Chronic warfarin (also mech AVR).   Pulmonary nodule    Sleep apnea    Stroke (HCC)      PAST SURGICAL HISTORY (PSH):  Past Surgical History:  Procedure Laterality Date   ABDOMINAL SURGERY     AORTIC VALVE REPLACEMENT  2001   [employer] Jude Mechanical   ARTHROSCOPIC REPAIR ACL     COLONOSCOPY  2019   MENISCUS REPAIR       MEDICATIONS:  Prior to Admission medications   Medication Sig Start Date End Date Taking? Authorizing Provider  amoxicillin-clavulanate (AUGMENTIN) 875-125 MG tablet Take 1 tablet by mouth every 12 (twelve) hours for 14 days. 12/22/21 01/05/22  Lynn Ito, MD  aspirin 81 MG EC tablet Take 81 mg by mouth daily.    [provider]  Ferrous Sulfate Dried (HIGH POTENCY IRON) 65 MG TABS Take 2 tablets by mouth daily. 08/13/21   [provider]  lisinopril (ZESTRIL) 10 MG tablet Take 1 tablet (10 mg total) by mouth daily. 08/15/21 02/11/22  End, Cristal Deer, MD  lovastatin (MEVACOR) 20 MG tablet Take 1 tablet (20 mg total) by mouth at bedtime. 10/20/21   Larae Grooms, NP  Potassium 99 MG TABS Take 2 tablets by mouth every evening.    [provider]  warfarin (COUMADIN) 5 MG tablet Take 0.5-1 tablets (2.5-5 mg total) by mouth daily at 4 PM. Take 5 mg Monday, Weds, Sat. 2.5 mg  on all other days 12/10/21   End, Cristal Deer, MD     ALLERGIES:  Allergies  Allergen Reactions   Alitraq Rash   Iodinated Contrast Media Rash   Iodine Rash    CT dye CT dye  CT dye CT dye     SOCIAL HISTORY:  Social History   Socioeconomic History   Marital status: Single    Spouse name: Not on file   Number of children: Not on file   Years of education: Not on file   Highest education level: Not on file  Occupational History   Not on file  Tobacco Use   Smoking status: Never    Passive exposure: Past   Smokeless tobacco: Never  Vaping Use   Vaping Use: Never used  Substance and Sexual Activity   Alcohol use: Yes    Comment: 2  mixed drinks per year   Drug use: Never   Sexual activity: Not Currently  Other Topics Concern   Not on file  Social History Narrative   Not on file   Social Determinants of Health   Financial Resource Strain: Not on file  Food Insecurity: Not on file  Transportation Needs: Not on file  Physical Activity: Not on file  Stress: Not on file  Social Connections: Not on file  Intimate Partner Violence: Not on file      FAMILY HISTORY:  Family History  Problem Relation Age of Onset   Heart attack Mother    Hypertension Mother    Hyperlipidemia Mother    Heart disease Mother    Heart attack Maternal Grandfather      REVIEW OF SYSTEMS:  Constitutional: denies weight loss, fever, chills, or sweats  Eyes: denies any other vision changes, history of eye injury  ENT: denies sore throat, hearing problems  Respiratory: denies shortness of breath, wheezing  Cardiovascular: denies chest pain, palpitations  Gastrointestinal: Positive abdominal pain, nausea and vomiting Genitourinary: denies burning with urination or urinary frequency Musculoskeletal: denies any other joint pains or cramps  Skin: denies any other rashes or skin discolorations  Neurological: denies any other headache, dizziness, weakness  Psychiatric: denies any other depression, anxiety   All other review of systems were negative   VITAL SIGNS:  Temp:  [98.3 F (36.8 C)] 98.3 F (36.8 C) (07/13 0348) Pulse Rate:  [61-70] 70 (07/13 0736) Resp:  [16-20] 18 (07/13 0736) BP: (109-121)/(69-73) 110/70 (07/13 0736) SpO2:  [96 %-98 %] 98 % (07/13 0736) Weight:  [78.3 kg] 78.3 kg (07/13 0730)     Height: 5\' 7"  (170.2 cm) Weight: 78.3 kg BMI (Calculated): 27.03   INTAKE/OUTPUT:  This shift: No intake/output data recorded.  Last 2 shifts: @IOLAST2SHIFTS @   PHYSICAL EXAM:  Constitutional:  -- Normal body habitus  -- Awake, alert, and oriented x3  Eyes:  -- Pupils equally round and reactive to light  -- No scleral  icterus  Ear, nose, and throat:  -- No jugular venous distension  Pulmonary:  -- No crackles  -- Equal breath sounds bilaterally -- Breathing non-labored at rest Cardiovascular:  -- S1, S2 present  -- No pericardial rubs Gastrointestinal:  -- Abdomen soft, tender to palpation in the right upper quadrant, non-distended, no guarding or rebound tenderness -- No abdominal masses appreciated, pulsatile or otherwise  Musculoskeletal and Integumentary:  -- Wounds: None appreciated -- Extremities: B/L UE and LE FROM, hands and feet warm, no edema  Neurologic:  -- Motor function: intact and symmetric -- Sensation: intact and symmetric  Labs:     Latest Ref Rng & Units 12/25/2021    3:49 AM 12/21/2021    9:23 AM 12/20/2021    4:11 AM  CBC  WBC 4.0 - 10.5 K/uL 5.8  4.7  5.1   Hemoglobin 13.0 - 17.0 g/dL 61.9  50.9  32.6   Hematocrit 39.0 - 52.0 % 38.9  37.5  35.6   Platelets 150 - 400 K/uL 156  140  126       Latest Ref Rng & Units 12/25/2021    3:49 AM 12/20/2021    4:11 AM 12/19/2021    4:25 PM  CMP  Glucose 70 - 99 mg/dL 712  96  98   BUN 6 - 20 mg/dL 7  14  12    Creatinine 0.61 - 1.24 mg/dL  4.58  0.99   Sodium 135 - 145 mmol/L 136  138  138   Potassium 3.5 - 5.1 mmol/L 3.6  3.6  4.0   Chloride 98 - 111 mmol/L 106  109  106   CO2 22 - 32 mmol/L 25  25  24    Calcium 8.9 - 10.3 mg/dL 8.6  8.3  8.7   Total Protein 6.5 - 8.1 g/dL 7.2  6.6  7.3   Total Bilirubin 0.3 - 1.2 mg/dL 0.8  1.1  1.2   Alkaline Phos 38 - 126 U/L 61  51  57   AST 15 - 41 U/L 30  26  26    ALT 0 - 44 U/L 21  21  24      Assessment/Plan:  56 y.o. male with acute cholecystitis, complicated by pertinent comorbidities including A-fib, history of aortic dissection status post grafting and aortic valve replacement on chronic Coumadin.   Subacute over chronic cholecystitis -Confirmed by HIDA scan during last admission -Initial onset was 2 weeks ago initially treated with IV antibiotic therapy which seems to  improve but now recurred yesterday in less than a week. -Due to the longtime onset of his cholecystitis the amount of inflammation makes the surgery too risky for complications in addition to his chronic medical comorbidities and anticoagulation on Coumadin. -I discussed case with interventional radiology physician assistant to evaluate patient for PEG and is cholecystostomy due to failure of medical management with antibiotic therapy. -Hold Coumadin to coordinate procedure.  Once procedure is done patient may be transition back to his oral anticoagulation therapy -appreciate hospitalist admission for evaluation and management of these chronic medical comorbidities -I encouraged the patient to ambulate -We will continue to follow closely.  , MD

## 2021-12-26 ENCOUNTER — Observation Stay: Payer: Self-pay | Admitting: Radiology

## 2021-12-26 DIAGNOSIS — Z952 Presence of prosthetic heart valve: Secondary | ICD-10-CM | POA: Diagnosis not present

## 2021-12-26 DIAGNOSIS — I480 Paroxysmal atrial fibrillation: Secondary | ICD-10-CM

## 2021-12-26 DIAGNOSIS — K810 Acute cholecystitis: Secondary | ICD-10-CM

## 2021-12-26 HISTORY — PX: IR PERC CHOLECYSTOSTOMY: IMG2326

## 2021-12-26 LAB — COMPREHENSIVE METABOLIC PANEL
ALT: 20 U/L (ref 0–44)
AST: 26 U/L (ref 15–41)
Albumin: 3.6 g/dL (ref 3.5–5.0)
Alkaline Phosphatase: 48 U/L (ref 38–126)
Anion gap: 4 — ABNORMAL LOW (ref 5–15)
BUN: 12 mg/dL (ref 6–20)
CO2: 25 mmol/L (ref 22–32)
Calcium: 8.8 mg/dL — ABNORMAL LOW (ref 8.9–10.3)
Chloride: 108 mmol/L (ref 98–111)
Creatinine, Ser: 1.22 mg/dL (ref 0.61–1.24)
GFR, Estimated: >60 mL/min (ref >60)
Glucose, Bld: 155 mg/dL — ABNORMAL HIGH (ref 70–99)
Potassium: 4.6 mmol/L (ref 3.5–5.1)
Sodium: 137 mmol/L (ref 135–145)
Total Bilirubin: 0.7 mg/dL (ref 0.3–1.2)
Total Protein: 6.4 g/dL — ABNORMAL LOW (ref 6.5–8.1)

## 2021-12-26 LAB — CBC
HCT: 37.5 % — ABNORMAL LOW (ref 39.0–52.0)
Hemoglobin: 12.9 g/dL — ABNORMAL LOW (ref 13.0–17.0)
MCH: 31.5 pg (ref 26.0–34.0)
MCHC: 34.4 g/dL (ref 30.0–36.0)
MCV: 91.5 fL (ref 80.0–100.0)
Platelets: 140 10*3/uL — ABNORMAL LOW (ref 150–400)
RBC: 4.1 MIL/uL — ABNORMAL LOW (ref 4.22–5.81)
RDW: 13.6 % (ref 11.5–15.5)
WBC: 4.4 10*3/uL (ref 4.0–10.5)
nRBC: 0 % (ref 0.0–0.2)

## 2021-12-26 LAB — TYPE AND SCREEN
ABO/RH(D): O POS
Antibody Screen: NEGATIVE

## 2021-12-26 LAB — PROTIME-INR
INR: 2.7 — ABNORMAL HIGH (ref 0.8–1.2)
INR: 2 — ABNORMAL HIGH (ref 0.8–1.2)
Prothrombin Time: 28.3 seconds — ABNORMAL HIGH (ref 11.4–15.2)
Prothrombin Time: 22.7 seconds — ABNORMAL HIGH (ref 11.4–15.2)

## 2021-12-26 LAB — ABO/RH: ABO/RH(D): O POS

## 2021-12-26 LAB — APTT: aPTT: 37 seconds — ABNORMAL HIGH (ref 24–36)

## 2021-12-26 MED ORDER — VITAMIN K1 10 MG/ML IJ SOLN
2.0000 mg | Freq: Once | INTRAMUSCULAR | Status: AC
Start: 2021-12-26 — End: 2021-12-26
  Administered 2021-12-26: 2 mg via INTRAVENOUS
  Filled 2021-12-26: qty 0.2

## 2021-12-26 MED ORDER — MIDAZOLAM HCL 2 MG/2ML IJ SOLN
INTRAMUSCULAR | Status: AC
  Filled 2021-12-26: qty 2

## 2021-12-26 MED ORDER — WARFARIN SODIUM 5 MG PO TABS
5.0000 mg | ORAL_TABLET | Freq: Once | ORAL | Status: AC
  Administered 2021-12-26: 5 mg via ORAL
  Filled 2021-12-26: qty 1

## 2021-12-26 MED ORDER — GADOBUTROL 1 MMOL/ML IV SOLN
5.0000 mL | Freq: Once | INTRAVENOUS | Status: AC | PRN
  Administered 2021-12-26: 5 mL

## 2021-12-26 MED ORDER — FENTANYL CITRATE (PF) 100 MCG/2ML IJ SOLN
INTRAMUSCULAR | Status: AC
  Filled 2021-12-26: qty 2

## 2021-12-26 MED ORDER — SODIUM CHLORIDE 0.9% IV SOLUTION: Freq: Once | INTRAVENOUS | Status: DC

## 2021-12-26 MED ORDER — HEPARIN (PORCINE) 25000 UT/250ML-% IV SOLN: 1150.0000 [IU]/h | INTRAVENOUS | Status: DC

## 2021-12-26 MED ORDER — WARFARIN - PHARMACIST DOSING INPATIENT: Freq: Every day | Status: DC

## 2021-12-26 MED ORDER — FENTANYL CITRATE (PF) 100 MCG/2ML IJ SOLN
INTRAMUSCULAR | Status: AC | PRN
  Administered 2021-12-26 (×2): 50 ug via INTRAVENOUS

## 2021-12-26 MED ORDER — SODIUM CHLORIDE 0.9% FLUSH
5.0000 mL | Freq: Three times a day (TID) | INTRAVENOUS | Status: DC
  Administered 2021-12-26 – 2021-12-29 (×9): 5 mL

## 2021-12-26 MED ORDER — LIDOCAINE 1% INJECTION FOR CIRCUMCISION
INJECTION | INTRAVENOUS | Status: DC | PRN
  Administered 2021-12-26: 10 mL via SUBCUTANEOUS

## 2021-12-26 MED ORDER — VITAMIN K1 10 MG/ML IJ SOLN: 1.0000 mg | Freq: Once | INTRAMUSCULAR | Status: DC

## 2021-12-26 MED ORDER — MIDAZOLAM HCL 2 MG/2ML IJ SOLN
INTRAMUSCULAR | Status: AC | PRN
  Administered 2021-12-26 (×2): 1 mg via INTRAVENOUS

## 2021-12-26 MED ORDER — ACETAMINOPHEN 325 MG PO TABS
650.0000 mg | ORAL_TABLET | ORAL | Status: DC | PRN
  Administered 2021-12-26 – 2021-12-27 (×2): 650 mg via ORAL
  Filled 2021-12-26 (×2): qty 2

## 2021-12-26 NOTE — Progress Notes (Signed)
Pt's drain is draining bloody colored drainage. It is about 30cc's. Md notified via secure chart. Waiting response.

## 2021-12-26 NOTE — Procedures (Signed)
Interventional Radiology Procedure Note  Procedure: Image guided cholecystostomy tube placement  Findings: Please refer to procedural dictation for full description. 10.2 Fr transperitoneal cholecystostomy place, to bag drainage.    Complications: None immediate  Estimated Blood Loss: < 5 mL  Recommendations: Keep to bag drainage. IR will follow. If the patient is not expected to be a surgical candidate, Spyglass-assisted percutaneous gallstone retrieval could be considered.  Please contact IR if desired in the future.   Marliss Coots, MD Pager: [phone]

## 2021-12-26 NOTE — Hospital Course (Addendum)
56 y.o. 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  He presented to the ED on early morning on 12/25/2021 with worsening RUQ abdominal pain.  Patient had been admitted at [facility] from 7/7--12/22/2021 for similar symptoms and was diagnosed with acute cholecystitis.  General surgery at that time recommended conservative management, IV antibiotics, and consideration of surgery in 4 weeks on elective basis due to patient's complicating co-morbid conditions.  Yesterday evening, patient ate a roast beef sandwich from [employer] around 6 PM.  He woke at 1 AM with significantly worsened right upper quadrant pain radiating to the left shoulder arm and back.  He denied fevers or chills.  Denies diarrhea.  Reports dark stools but takes oral iron, no red blood seen in stool.  Patient was admitted to the hospital and started on IV antibiotics for acute cholecystitis.  General surgery and interventional radiology consulted.  Percutaneous biliary drain placed.

## 2021-12-26 NOTE — Progress Notes (Signed)
CC: Cholecystitis Subjective: Doing better some intermittent RUQ but better, hje is starving and tolerated supper  Objective: Vital signs in last 24 hours: Temp:  [97.6 F (36.4 C)-97.9 F (36.6 C)] 97.6 F (36.4 C) (07/14 1340) Pulse Rate:  [44-55] 55 (07/14 1340) Resp:  [16-18] 16 (07/14 1340) BP: (96-129)/(56-68) 127/67 (07/14 1340) SpO2:  [95 %-99 %] 97 % (07/14 1340)    Intake/Output from previous day: 07/13 0701 - 07/14 0700 In: 1507.4 [P.O.:480; I.V.:927.4; IV Piggyback:100] Out: -  Intake/Output this shift: No intake/output data recorded.  Physical exam:  NAD alert Abd: soft, mild TTP RUQ w/o peritonitis and no Murphy sign Ext: well perfused  Lab Results: CBC  Recent Labs    12/25/21 0349 12/26/21 0607  WBC 5.8 4.4  HGB 13.1 12.9*  HCT 38.9* 37.5*  PLT 156 140*   BMET Recent Labs    12/25/21 0349 12/26/21 0607  NA 136 137  K 3.6 4.6  CL 106 108  CO2 25 25  GLUCOSE 113* 155*  BUN 7 12  CREATININE 1.06 1.22  CALCIUM 8.6* 8.8*   PT/INR Recent Labs    12/25/21 0840 12/26/21 0607  LABPROT 22.9* 28.3*  INR 2.0* 2.7*   ABG No results for input(s): "PHART", "HCO3" in the last 72 hours.  Invalid input(s): "PCO2", "PO2"  Studies/Results: No results found.  Anti-infectives: Anti-infectives (From admission, onward)    Start     Dose/Rate Route Frequency Ordered Stop   12/25/21 1430  piperacillin-tazobactam (ZOSYN) IVPB 3.375 g        3.375 g 12.5 mL/hr over 240 Minutes Intravenous Every 8 hours 12/25/21 1346     12/25/21 0815  piperacillin-tazobactam (ZOSYN) IVPB 3.375 g        3.375 g 100 mL/hr over 30 Minutes Intravenous  Once 12/25/21 0804 12/25/21 1038       Assessment/Plan:  Recurrent cholecystitis in a patient fully anticoagulated.  Plan was to perform cholecystostomy tube today but INR actually went in the opposite direction.  FFP antibiotic mean K to be given in hopes to improve INR.  Depending on his INR may be able to do it  either today or tomorrow.  From a surgical perspective he is not toxic not peritonitic and does not need emergent surgical intervention.  We will continue to follow him. Continue antibiotic therapy. Please note that I spent greater than 35 minutes in this encounter including coordination of his care, personally reviewing imaging studies, counseling the patient and performing appropriate mentation  Sterling Big, MD, [facility]  12/26/2021

## 2021-12-26 NOTE — Progress Notes (Signed)
Patient clinically stable post 10FR Chole tube placement per DR Suttle, tolerated well. Denies complaints post procedure. Received Versed 2 mg along with Fentanyl 100 mcg IV for procedure. Vitals stable pre and post procedure. Report given to Natale Milch RN post procedure Judithann Sauger on 2C at bedside.

## 2021-12-26 NOTE — Consult Note (Signed)
ANTICOAGULATION CONSULT NOTE  Pharmacy Consult for IV Heparin bridge to Warfarin Indication: atrial fibrillation ISO mAVR  Patient Measurements: Height: 5\' 7"  (170.2 cm) Weight: 78.3 kg (172 lb 9.9 oz) IBW/kg (Calculated) : 66.1 Heparin Dosing Weight: 78.3 kg  Labs: Recent Labs    12/25/21 0349 12/25/21 0553 12/25/21 0840 12/26/21 0607 12/26/21 1120 12/26/21 1501  HGB 13.1  --   --  12.9*  --   --   HCT 38.9*  --   --  37.5*  --   --   PLT 156  --   --  140*  --   --   APTT  --   --   --   --  37*  --   LABPROT  --   --  22.9* 28.3*  --  22.7*  INR  --   --  2.0* 2.7*  --  2.0*  CREATININE 1.06  --   --  1.22  --   --   TROPONINIHS 30* 31*  --   --   --   --     Estimated Creatinine Clearance: 64.7 mL/min (by C-G formula based on SCr of 1.22 mg/dL).   Medical History: Past Medical History:  Diagnosis Date   Anemia    Aortic aneurysm and dissection (HCC)    a. 2001 s/p grafting and AVR; b. 09/2021 CTA Chest: Dil Ao root up to 33mm. Ao arch 5mm. Desc Ao 86mm. Complicated arch dissection w/ mult false and/or partially thrombosed lumens. Dissection flaps extend into all great vessels except L carotid. Dissection cont into R CCA and into abd->L RA.   Chronic anticoagulation    Dissecting aortic aneurysm, thoracoabdominal (HCC)    Fatty liver    GERD (gastroesophageal reflux disease)    H/O mechanical aortic valve replacement    a. 2001 s/p AVR in setting of Asc Ao aneurysm repair; b. 08/2021 Echo: EF 50-55%, no rwma, mild LVH, GrII DD, nl RV fxn, mild MR, triv AI w/ mean AoV grad 09/2021.   Heart murmur    History of cardiac catheterization    a. 08/2019 MV: basal-dist lateral and inflat defect; b. 07/2020 Cath: nl cors.   Hyperlipidemia    Hypertension    Hypolipidemia    Iron deficiency    PAF (paroxysmal atrial fibrillation) (HCC)    a. CHA2DS2VASc = 3.  Chronic warfarin (also mech AVR).   Pulmonary nodule    Sleep apnea    Stroke [facility])     Medications:  Warfarin  5 mg MoWeSa & 2.5 mg SuTuThFr Last patient reported dose 7/12  Assessment: Patient is a 56 y/o M with medical history including Afib & mechanical aortic valve replacement on warfarin who is admitted with acute cholecystitis s/p cholecystostomy tube placement on 7/14. Pharmacy consulted to initiate heparin infusion while warfarin is on hold and in setting of subtherapeutic INR.  INR on 7/14 = 2. Patient received Vitamin K 2 mg IV x 1 dose and 1u FFP prior to procedure.  Ok with IR to start anticoagulation 3 hours after procedure if no bloody output from drain  Goal of Therapy:  Heparin level 0.3-0.7 units/ml INR 2.5 - 3.5 Monitor platelets by anticoagulation protocol: Yes   Plan:   Heparin --Start heparin at 1150 units/hr, no bolus --HL 6 hours after initiation of infusion --Daily CBC per protocol while on IV heparin --Continue heparin until INR therapeutic x 24h (2 consecutive readings)  Warfarin --Give warfarin 5 mg tonight --Daily INR per  protocol  Tressie Ellis 12/26/2021,4:52 PM

## 2021-12-26 NOTE — Progress Notes (Signed)
Progress Note   Patient: Ryan [name] IRC:[id number] DOB: [birth year] DOA: 12/25/2021     0 DOS: the patient was seen and examined on 12/26/2021   Brief hospital course: 56 y.o. 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  He presented to the ED on early morning on 12/25/2021 with worsening RUQ abdominal pain.  Patient had been admitted at [facility] from 7/7--12/22/2021 for similar symptoms and was diagnosed with acute cholecystitis.  General surgery at that time recommended conservative management, IV antibiotics, and consideration of surgery in 4 weeks on elective basis due to patient's complicating co-morbid conditions.  Yesterday evening, patient ate a roast beef sandwich from [employer] around 6 PM.  He woke at 1 AM with significantly worsened right upper quadrant pain radiating to the left shoulder arm and back.  He denied fevers or chills.  Denies diarrhea.  Reports dark stools but takes oral iron, no red blood seen in stool.  Patient was admitted to the hospital and started on IV antibiotics for acute cholecystitis.  General surgery and interventional radiology consulted.  Plan is for placement of a percutaneous biliary drain once INR reversed.   Assessment and Plan: * Acute cholecystitis Diagnosed during prior admission last week.  Presented with worsening right upper quadrant pain. -- General surgery following -- IR consulted for placement of PERC biliary drain -- IR requesting INR reversal with FFP and vitamin K and plan to proceed with percutaneous biliary drain placement today --N.p.o. until after drain placement -- Continue IV Zosyn -- Continue IV fluids -- Monitor abdominal  exam, fever curve, and for signs of obstructive jaundice -- Daily CMP  RUQ pain Due to acute cholecystitis.  Management as outlined.  Paroxysmal atrial fibrillation (HCC) Heart rate controlled on admission.  Appears not on rate control agents.  Coumadin on hold for IR procedure.  Monitor on telemetry.  Will bridge with IV heparin given mechanical aortic valve after IR procedure today.  Chronic heart failure with preserved ejection fraction (HFpEF) (HCC) Appears euvolemic and compensated on admission.  Echo in March 2023 showed EF 50 to 55%, mild LVH, grade 2 diastolic dysfunction, mild MR. -- Monitor volume status -- ACE inhibitor on hold due to soft BP  History of CVA (cerebrovascular accident) No acute issues. Continue aspirin and statin.  Hx of repair of dissecting thoracic aortic aneurysm, Stanford type A No acute issues. Diligent BP control.  Hyperlipidemia Continue statin  History of heart valve replacement with mechanical valve INR 2.0 on admission.  Interventional radiology needs INR less than 1.5 for placement of percutaneous biliary drain. -- Hold Coumadin -- Daily INR's -- FFP and vitamin K given today per request of IR to proceed with drain placement -- Start heparin drip after procedure when okay with radiologist and continue until INR is again therapeutic  Chronic kidney disease (CKD), stage II (mild) Renal function stable.  Monitor BMP        Subjective: Patient seen awake resting in bed on rounds this morning.  Since 2 FFP transfusion to reverse his INR so that radiology can proceed with placement of biliary drain today.  He has some intermittent episodes of right upper quadrant pain but denies nausea vomiting fevers or chills today.  No other acute complaints  at this time.  Physical Exam: Vitals:   12/26/21 1550 12/26/21 1554 12/26/21 1555 12/26/21 1601  BP: 130/66 132/66 127/66 130/74  Pulse: (!) 59 61 (!) 57 (!) 52  Resp: (!) 28 19 (!) 23 19  Temp:       TempSrc:      SpO2: 100% 100% 100% 97%  Weight:      Height:       General exam: awake, alert, no acute distress HEENT: atraumatic, clear conjunctiva, anicteric sclera, moist mucus membranes, hearing grossly normal  Respiratory system: On room air, normal respiratory effort. Cardiovascular system: normal S1/S2, RRR, no peripheral edema.   Gastrointestinal system: soft, mildly distended nontender Central nervous system: A&O x3. no gross focal neurologic deficits, normal speech Extremities: moves all, no edema, normal tone Skin: dry, intact, normal temperature, normal color Psychiatry: normal mood, congruent affect, judgement and insight appear normal   Data Reviewed:  Notable labs: Glucose 155, calcium 8.8, total protein 6.4, hemoglobin 12.9 from 13.1, platelets 140 from 156, INR 2.7 and this afternoon 2.0  Family Communication: None, will attempt to call  Disposition: Status is: Inpatient Remains inpatient appropriate because: IR procedure planned for today, remains on IV antibiotics and will be on IV heparin bridge until INR again therapeutic once warfarin is resumed.     Planned Discharge Destination: Home    Time spent: 40 minutes  Author: Pennie Banter, DO 12/26/2021 4:08 PM  For on call review [email or web address].

## 2021-12-26 NOTE — Consult Note (Signed)
ANTICOAGULATION CONSULT NOTE  Pharmacy Consult for IV Heparin bridge to Warfarin Indication: atrial fibrillation ISO mAVR  Patient Measurements: Height: 5\' 7"  (170.2 cm) Weight: 78.3 kg (172 lb 9.9 oz) IBW/kg (Calculated) : 66.1 Heparin Dosing Weight: 78.3 kg  Labs: Recent Labs    12/25/21 0349 12/25/21 0553 12/25/21 0840 12/26/21 0607 12/26/21 1120 12/26/21 1501  HGB 13.1  --   --  12.9*  --   --   HCT 38.9*  --   --  37.5*  --   --   PLT 156  --   --  140*  --   --   APTT  --   --   --   --  37*  --   LABPROT  --   --  22.9* 28.3*  --  22.7*  INR  --   --  2.0* 2.7*  --  2.0*  CREATININE 1.06  --   --  1.22  --   --   TROPONINIHS 30* 31*  --   --   --   --      Estimated Creatinine Clearance: 64.7 mL/min (by C-G formula based on SCr of 1.22 mg/dL).   Medical History: Past Medical History:  Diagnosis Date   Anemia    Aortic aneurysm and dissection (HCC)    a. 2001 s/p grafting and AVR; b. 09/2021 CTA Chest: Dil Ao root up to 51mm. Ao arch 8mm. Desc Ao 66mm. Complicated arch dissection w/ mult false and/or partially thrombosed lumens. Dissection flaps extend into all great vessels except L carotid. Dissection cont into R CCA and into abd->L RA.   Chronic anticoagulation    Dissecting aortic aneurysm, thoracoabdominal (HCC)    Fatty liver    GERD (gastroesophageal reflux disease)    H/O mechanical aortic valve replacement    a. 2001 s/p AVR in setting of Asc Ao aneurysm repair; b. 08/2021 Echo: EF 50-55%, no rwma, mild LVH, GrII DD, nl RV fxn, mild MR, triv AI w/ mean AoV grad 09/2021.   Heart murmur    History of cardiac catheterization    a. 08/2019 MV: basal-dist lateral and inflat defect; b. 07/2020 Cath: nl cors.   Hyperlipidemia    Hypertension    Hypolipidemia    Iron deficiency    PAF (paroxysmal atrial fibrillation) (HCC)    a. CHA2DS2VASc = 3.  Chronic warfarin (also mech AVR).   Pulmonary nodule    Sleep apnea    Stroke [facility])     Medications:   Warfarin 5 mg MoWeSa & 2.5 mg SuTuThFr Last patient reported dose 7/12  Assessment: Patient is a 56 y/o M with medical history including Afib & mechanical aortic valve replacement on warfarin who is admitted with acute cholecystitis s/p cholecystostomy tube placement on 7/14. Pharmacy consulted to initiate heparin infusion while warfarin is on hold and in setting of subtherapeutic INR.  INR on 7/14 = 2. Patient received Vitamin K 2 mg IV x 1 dose and 1u FFP prior to procedure.  Ok with IR to start anticoagulation 3 hours after procedure if no bloody output from drain  Goal of Therapy:  Heparin level 0.3-0.7 units/ml INR 2.5 - 3.5 Monitor platelets by anticoagulation protocol: Yes   Plan: 30 mL bloody output from drain noted by RN --instructions by surgery are to hold heparin for now and give warfarin tonight  Heparin --hold off on heparin infusion until surgery has had time to evaluate on 12/27/21  Warfarin --Give warfarin 5 mg  tonight --Daily INR per protocol  Lowella Bandy 12/26/2021,8:30 PM

## 2021-12-27 DIAGNOSIS — K810 Acute cholecystitis: Secondary | ICD-10-CM | POA: Diagnosis not present

## 2021-12-27 LAB — CBC
HCT: 33.9 % — ABNORMAL LOW (ref 39.0–52.0)
HCT: 36.4 % — ABNORMAL LOW (ref 39.0–52.0)
Hemoglobin: 11.5 g/dL — ABNORMAL LOW (ref 13.0–17.0)
Hemoglobin: 12.2 g/dL — ABNORMAL LOW (ref 13.0–17.0)
MCH: 31.5 pg (ref 26.0–34.0)
MCH: 31.6 pg (ref 26.0–34.0)
MCHC: 33.9 g/dL (ref 30.0–36.0)
MCHC: 33.5 g/dL (ref 30.0–36.0)
MCV: 92.9 fL (ref 80.0–100.0)
MCV: 94.3 fL (ref 80.0–100.0)
Platelets: 143 10*3/uL — ABNORMAL LOW (ref 150–400)
Platelets: 148 10*3/uL — ABNORMAL LOW (ref 150–400)
RBC: 3.65 MIL/uL — ABNORMAL LOW (ref 4.22–5.81)
RBC: 3.86 MIL/uL — ABNORMAL LOW (ref 4.22–5.81)
RDW: 13.3 % (ref 11.5–15.5)
RDW: 13.7 % (ref 11.5–15.5)
WBC: 11.4 10*3/uL — ABNORMAL HIGH (ref 4.0–10.5)
WBC: 9.3 10*3/uL (ref 4.0–10.5)
nRBC: 0 % (ref 0.0–0.2)
nRBC: 0 % (ref 0.0–0.2)

## 2021-12-27 LAB — HEPARIN LEVEL (UNFRACTIONATED): Heparin Unfractionated: 0.31 IU/mL (ref 0.30–0.70)

## 2021-12-27 LAB — BASIC METABOLIC PANEL
Anion gap: 6 (ref 5–15)
BUN: 15 mg/dL (ref 6–20)
CO2: 25 mmol/L (ref 22–32)
Calcium: 8.4 mg/dL — ABNORMAL LOW (ref 8.9–10.3)
Chloride: 105 mmol/L (ref 98–111)
Creatinine, Ser: 1.41 mg/dL — ABNORMAL HIGH (ref 0.61–1.24)
GFR, Estimated: 59 mL/min — ABNORMAL LOW (ref >60)
Glucose, Bld: 139 mg/dL — ABNORMAL HIGH (ref 70–99)
Potassium: 4.1 mmol/L (ref 3.5–5.1)
Sodium: 136 mmol/L (ref 135–145)

## 2021-12-27 LAB — PREPARE FRESH FROZEN PLASMA: Unit division: 0

## 2021-12-27 LAB — PROTIME-INR
INR: 1.8 — ABNORMAL HIGH (ref 0.8–1.2)
Prothrombin Time: 20.4 seconds — ABNORMAL HIGH (ref 11.4–15.2)

## 2021-12-27 LAB — BPAM FFP
Blood Product Expiration Date: 202307182359
ISSUE DATE / TIME: 202307141350
Unit Type and Rh: 6200

## 2021-12-27 LAB — MAGNESIUM: Magnesium: 1.8 mg/dL (ref 1.7–2.4)

## 2021-12-27 MED ORDER — WARFARIN SODIUM 7.5 MG PO TABS
7.5000 mg | ORAL_TABLET | Freq: Once | ORAL | Status: AC
Start: 2021-12-27 — End: 2021-12-27
  Administered 2021-12-27: 7.5 mg via ORAL
  Filled 2021-12-27: qty 1

## 2021-12-27 MED ORDER — HEPARIN BOLUS VIA INFUSION
4000.0000 [IU] | Freq: Once | INTRAVENOUS | Status: AC
Start: 2021-12-27 — End: 2021-12-27
  Administered 2021-12-27: 4000 [IU] via INTRAVENOUS
  Filled 2021-12-27: qty 4000

## 2021-12-27 MED ORDER — OXYCODONE-ACETAMINOPHEN 5-325 MG PO TABS
1.0000 | ORAL_TABLET | ORAL | Status: DC | PRN
  Administered 2021-12-27 (×2): 1 via ORAL
  Filled 2021-12-27 (×3): qty 1

## 2021-12-27 MED ORDER — HEPARIN (PORCINE) 25000 UT/250ML-% IV SOLN
1200.0000 [IU]/h | INTRAVENOUS | Status: DC
Start: 2021-12-27 — End: 2021-12-29
  Administered 2021-12-27: 1050 [IU]/h via INTRAVENOUS
  Administered 2021-12-28 – 2021-12-29 (×2): 1200 [IU]/h via INTRAVENOUS
  Filled 2021-12-27 (×3): qty 250

## 2021-12-27 NOTE — Consult Note (Signed)
ANTICOAGULATION CONSULT NOTE  Pharmacy Consult for IV Heparin bridge to Warfarin Indication: atrial fibrillation ISO mAVR  Patient Measurements: Height: 5\' 7"  (170.2 cm) Weight: 78.3 kg (172 lb 9.9 oz) IBW/kg (Calculated) : 66.1 Heparin Dosing Weight: 78.3 kg  Labs: Recent Labs    12/25/21 0349 12/25/21 0553 12/25/21 0840 12/26/21 0607 12/26/21 1120 12/26/21 1501 12/27/21 0204 12/27/21 1408 12/27/21 2253  HGB 13.1  --   --  12.9*  --   --  11.5* 12.2*  --   HCT 38.9*  --   --  37.5*  --   --  33.9* 36.4*  --   PLT 156  --   --  140*  --   --  143* 148*  --   APTT  --   --   --   --  37*  --   --   --   --   LABPROT  --   --    < > 28.3*  --  22.7* 20.4*  --   --   INR  --   --    < > 2.7*  --  2.0* 1.8*  --   --   HEPARINUNFRC  --   --   --   --   --   --   --   --  0.31  CREATININE 1.06  --   --  1.22  --   --  1.41*  --   --   TROPONINIHS 30* 31*  --   --   --   --   --   --   --    < > = values in this interval not displayed.     Estimated Creatinine Clearance: 56 mL/min (A) (by C-G formula based on SCr of 1.41 mg/dL (H)).   Medical History: Past Medical History:  Diagnosis Date   Anemia    Aortic aneurysm and dissection (HCC)    a. 2001 s/p grafting and AVR; b. 09/2021 CTA Chest: Dil Ao root up to 49mm. Ao arch 80mm. Desc Ao 47mm. Complicated arch dissection w/ mult false and/or partially thrombosed lumens. Dissection flaps extend into all great vessels except L carotid. Dissection cont into R CCA and into abd->L RA.   Chronic anticoagulation    Dissecting aortic aneurysm, thoracoabdominal (HCC)    Fatty liver    GERD (gastroesophageal reflux disease)    H/O mechanical aortic valve replacement    a. 2001 s/p AVR in setting of Asc Ao aneurysm repair; b. 08/2021 Echo: EF 50-55%, no rwma, mild LVH, GrII DD, nl RV fxn, mild MR, triv AI w/ mean AoV grad 09/2021.   Heart murmur    History of cardiac catheterization    a. 08/2019 MV: basal-dist lateral and inflat defect;  b. 07/2020 Cath: nl cors.   Hyperlipidemia    Hypertension    Hypolipidemia    Iron deficiency    PAF (paroxysmal atrial fibrillation) (HCC)    a. CHA2DS2VASc = 3.  Chronic warfarin (also mech AVR).   Pulmonary nodule    Sleep apnea    Stroke [facility])     Medications:  Warfarin 5 mg MoWeSa & 2.5 mg SuTuThFr Last patient reported dose 7/12  Assessment: Patient is a 56 y/o M with medical history including Afib & mechanical aortic valve replacement on warfarin who is admitted with acute cholecystitis s/p cholecystostomy tube placement on 7/14. Pharmacy consulted to initiate heparin infusion while warfarin is on hold and in setting of subtherapeutic  INR.  INR on 7/14 = 2. Patient received Vitamin K 2 mg IV x 1 dose and 1u FFP prior to procedure.  Date INR Warfarin Dose  7/14 2 5  mg (s/p vitamin K IV 2 mg)  7/15 1.8 7.5 mg      Goal of Therapy:  Heparin level 0.3-0.7 units/ml INR 2.5 - 3.5 Monitor platelets by anticoagulation protocol: Yes  7/15 2253 HL 0.31, therapeutic x 1   Plan:  Continue heparin infusion at 1050 units/hr. Recheck heparin level in 6 hours. CBC daily while on heparin. Continue heparin bridge until INR > 2.5.   Warfarin INR is subtherapeutic. Will give warfarin 7.5 mg x 1 tonight (1.5x of home dose), due to held warfarin doses and subtherapeutic INR. Daily INR.    Otelia Sergeant, PharmD, [facility] 12/27/2021 11:29 PM

## 2021-12-27 NOTE — Progress Notes (Signed)
CC: Cholecystitis Subjective: Doing better some, drain site pain in RUQ now understood, tolerating diet.  Minimal bloody drainage currently.  Suspect this is just part of the inflammatory gallbladder changes.  I believe it is safe to proceed with anticoagulation. Initial drainage was brown, turning brown and slightly blood-tinged.  Currently just minimal amount of blood-tinged drainage. Objective: Vital signs in last 24 hours: Temp:  [96.5 F (35.8 C)-98 F (36.7 C)] 98 F (36.7 C) (07/15 0901) Pulse Rate:  [47-61] 56 (07/15 0901) Resp:  [16-28] 18 (07/15 0901) BP: (113-135)/(56-74) 132/71 (07/15 0901) SpO2:  [94 %-100 %] 100 % (07/15 0901) Last BM Date : 12/26/21  Intake/Output from previous day: 07/14 0701 - 07/15 0700 In: 1282.4 [I.V.:766.3; Blood:300; IV Piggyback:201.1] Out: 100 [Drains:100] Intake/Output this shift: No intake/output data recorded.  Physical exam:  NAD alert Abd: soft, TTP DRAIN SITE AT RUQ w/o peritonitis and no Murphy sign Ext: well perfused  Lab Results: CBC  Recent Labs    12/26/21 0607 12/27/21 0204  WBC 4.4 11.4*  HGB 12.9* 11.5*  HCT 37.5* 33.9*  PLT 140* 143*    BMET Recent Labs    12/26/21 0607 12/27/21 0204  NA 137 136  K 4.6 4.1  CL 108 105  CO2 25 25  GLUCOSE 155* 139*  BUN 12 15  CREATININE 1.22 1.41*  CALCIUM 8.8* 8.4*    PT/INR Recent Labs    12/26/21 1501 12/27/21 0204  LABPROT 22.7* 20.4*  INR 2.0* 1.8*    ABG No results for input(s): "PHART", "HCO3" in the last 72 hours.  Invalid input(s): "PCO2", "PO2"  Studies/Results: No results found.  Anti-infectives: Anti-infectives (From admission, onward)    Start     Dose/Rate Route Frequency Ordered Stop   12/25/21 1430  piperacillin-tazobactam (ZOSYN) IVPB 3.375 g        3.375 g 12.5 mL/hr over 240 Minutes Intravenous Every 8 hours 12/25/21 1346     12/25/21 0815  piperacillin-tazobactam (ZOSYN) IVPB 3.375 g        3.375 g 100 mL/hr over 30 Minutes  Intravenous  Once 12/25/21 0804 12/25/21 1038       Assessment/Plan:  Overall seems to be improving clinically, believe it is reasonably safe to resume anticoagulation, we will continue to monitor drain output.  From a surgical perspective he is not toxic not peritonitic and does not need emergent surgical intervention.  We will continue to follow him. Continue antibiotic therapy. Please note that I spent greater than 25 minutes in this encounter including coordination of his care, personally reviewing imaging studies, counseling the patient and performing appropriate mentation   Campbell Lerner, M.D., [facility]  12/27/2021 ; 9:50 AM

## 2021-12-27 NOTE — Plan of Care (Signed)
  Problem: Cardiac: Goal: Ability to achieve and maintain adequate cardiopulmonary perfusion will improve Outcome: Progressing   Problem: Activity: Goal: Risk for activity intolerance will decrease Outcome: Progressing   Problem: Elimination: Goal: Will not experience complications related to bowel motility Outcome: Progressing Goal: Will not experience complications related to urinary retention Outcome: Progressing   Problem: Pain Managment: Goal: General experience of comfort will improve Outcome: Progressing

## 2021-12-27 NOTE — Progress Notes (Signed)
Progress Note   Patient: Ryan [name] [id number] DOB: [birth year] DOA: 12/25/2021     1 DOS: the patient was seen and examined on 12/27/2021   Brief hospital course: 56 y.o. 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  He presented to the ED on early morning on 12/25/2021 with worsening RUQ abdominal pain.  Patient had been admitted at [facility] from 7/7--12/22/2021 for similar symptoms and was diagnosed with acute cholecystitis.  General surgery at that time recommended conservative management, IV antibiotics, and consideration of surgery in 4 weeks on elective basis due to patient's complicating co-morbid conditions.  Yesterday evening, patient ate a roast beef sandwich from [employer] around 6 PM.  He woke at 1 AM with significantly worsened right upper quadrant pain radiating to the left shoulder arm and back.  He denied fevers or chills.  Denies diarrhea.  Reports dark stools but takes oral iron, no red blood seen in stool.  Patient was admitted to the hospital and started on IV antibiotics for acute cholecystitis.  General surgery and interventional radiology consulted.  Plan is for placement of a percutaneous biliary drain once INR reversed.   Assessment and Plan: * Acute cholecystitis Diagnosed during prior admission last week.  Presented with worsening right upper quadrant pain. Percutaneous biliary drain placed by IR on 7/14 after INR was reversed with FFP and vitamin K -- General surgery, interventional radiology consulted -- Resumed on diet tolerating -- Continue IV Zosyn -- Continue IV fluids given worsening renal function -- Monitor abdominal exam, fever curve, and for  signs of obstructive jaundice -- Daily CMP -- Ongoing bleeding from biliary tube, repeat CBC this afternoon prior to resuming anticoagulation  RUQ pain Due to acute cholecystitis.  Management as outlined.  Paroxysmal atrial fibrillation (HCC) Heart rate controlled on admission.  Appears not on rate control agents.  Coumadin on hold for IR procedure.  Monitor on telemetry.  Will bridge with IV heparin given mechanical aortic valve after IR procedure today.  Chronic heart failure with preserved ejection fraction (HFpEF) (HCC) Appears euvolemic and compensated on admission.  Echo in March 2023 showed EF 50 to 55%, mild LVH, grade 2 diastolic dysfunction, mild MR. -- Monitor volume status -- ACE inhibitor on hold due to soft BP  History of CVA (cerebrovascular accident) No acute issues. Continue aspirin and statin.  Hx of repair of dissecting thoracic aortic aneurysm, Stanford type A No acute issues. Diligent BP control.  Hyperlipidemia Continue statin  History of heart valve replacement with mechanical valve INR 2.0 on admission.  Interventional radiology needs INR less than 1.5 for placement of percutaneous biliary drain.  Patient was given FFP and vitamin K on 7/15 for biliary drain placement per IR's request -- Daily INR's --INR today 1.8 -- Heparin drip has been held off since procedure due to blood in the biliary drain --Start heparin and warfarin later today if hemoglobin remained stable on repeat CBC this afternoon  Chronic kidney disease (CKD), stage II (mild) Renal function stable.  Monitor BMP        Subjective: Patient seen awake resting in bed on rounds this morning.  He reports pain in the right upper abdomen at the location of the biliary drain.  Denies nausea vomiting or  other abdominal pain.  Tolerating diet okay.  Denies any shortness of breath, chest pain, palpitations, dizziness lightheadedness.  Physical Exam: Vitals:   12/26/21 1841 12/26/21 2055  12/27/21 0246 12/27/21 0901  BP: (!) 119/56 115/60 119/60 132/71  Pulse: (!) 59 60 (!) 47 (!) 56  Resp: 18 16 18 18   Temp: (!) 97.4 F (36.3 C) 97.7 F (36.5 C) 97.8 F (36.6 C) 98 F (36.7 C)  TempSrc: Oral Oral  Oral  SpO2: 96% 94% 99% 100%  Weight:      Height:       General exam: awake, alert, no acute distress HEENT: atraumatic, clear conjunctiva, anicteric sclera, moist mucus membranes, hearing grossly normal  Respiratory system: On room air, normal respiratory effort. Cardiovascular system: normal S1/S2, RRR, no peripheral edema.   Gastrointestinal system: Biliary drain in place with small amount of bloody fluid in the bag and tubing, abdomen mildly distended but nontender except in the area of the drain Central nervous system: A&O x3. no gross focal neurologic deficits, normal speech Extremities: moves all, no edema, normal tone Psychiatry: normal mood, congruent affect, judgement and insight appear normal   Data Reviewed:  Notable labs: Glucose 139, creatinine 1.41 up from 1.22, calcium 8.4, GFR 59, WBC 11.4 up from 4.4, hemoglobin 11.5 from 12.9, platelets 143 stable and improving, INR 1.8  Family Communication: None, will attempt to call  Disposition: Status is: Inpatient Remains inpatient appropriate because: remains on IV antibiotics and will be on IV heparin bridge until INR again therapeutic on warfarin.  Has some ongoing bleeding from biliary drain that warrants close monitor while resuming anticoagulation.     Planned Discharge Destination: Home    Time spent: 40 minutes  Author: , DO 12/27/2021 3:09 PM  For on call review [email or web address].

## 2021-12-27 NOTE — Consult Note (Signed)
ANTICOAGULATION CONSULT NOTE  Pharmacy Consult for IV Heparin bridge to Warfarin Indication: atrial fibrillation ISO mAVR  Patient Measurements: Height: 5\' 7"  (170.2 cm) Weight: 78.3 kg (172 lb 9.9 oz) IBW/kg (Calculated) : 66.1 Heparin Dosing Weight: 78.3 kg  Labs: Recent Labs    12/25/21 0349 12/25/21 0553 12/25/21 0840 12/26/21 0607 12/26/21 1120 12/26/21 1501 12/27/21 0204  HGB 13.1  --   --  12.9*  --   --  11.5*  HCT 38.9*  --   --  37.5*  --   --  33.9*  PLT 156  --   --  140*  --   --  143*  APTT  --   --   --   --  37*  --   --   LABPROT  --   --    < > 28.3*  --  22.7* 20.4*  INR  --   --    < > 2.7*  --  2.0* 1.8*  CREATININE 1.06  --   --  1.22  --   --  1.41*  TROPONINIHS 30* 31*  --   --   --   --   --    < > = values in this interval not displayed.     Estimated Creatinine Clearance: 56 mL/min (A) (by C-G formula based on SCr of 1.41 mg/dL (H)).   Medical History: Past Medical History:  Diagnosis Date   Anemia    Aortic aneurysm and dissection (HCC)    a. 2001 s/p grafting and AVR; b. 09/2021 CTA Chest: Dil Ao root up to 28mm. Ao arch 21mm. Desc Ao 36mm. Complicated arch dissection w/ mult false and/or partially thrombosed lumens. Dissection flaps extend into all great vessels except L carotid. Dissection cont into R CCA and into abd->L RA.   Chronic anticoagulation    Dissecting aortic aneurysm, thoracoabdominal (HCC)    Fatty liver    GERD (gastroesophageal reflux disease)    H/O mechanical aortic valve replacement    a. 2001 s/p AVR in setting of Asc Ao aneurysm repair; b. 08/2021 Echo: EF 50-55%, no rwma, mild LVH, GrII DD, nl RV fxn, mild MR, triv AI w/ mean AoV grad 09/2021.   Heart murmur    History of cardiac catheterization    a. 08/2019 MV: basal-dist lateral and inflat defect; b. 07/2020 Cath: nl cors.   Hyperlipidemia    Hypertension    Hypolipidemia    Iron deficiency    PAF (paroxysmal atrial fibrillation) (HCC)    a. CHA2DS2VASc = 3.   Chronic warfarin (also mech AVR).   Pulmonary nodule    Sleep apnea    Stroke [facility])     Medications:  Warfarin 5 mg MoWeSa & 2.5 mg SuTuThFr Last patient reported dose 7/12  Assessment: Patient is a 56 y/o M with medical history including Afib & mechanical aortic valve replacement on warfarin who is admitted with acute cholecystitis s/p cholecystostomy tube placement on 7/14. Pharmacy consulted to initiate heparin infusion while warfarin is on hold and in setting of subtherapeutic INR.  INR on 7/14 = 2. Patient received Vitamin K 2 mg IV x 1 dose and 1u FFP prior to procedure.  Date INR Warfarin Dose  7/14 2 5  mg (s/p vitamin K IV 2 mg)  7/15 1.8 7.5 mg      Goal of Therapy:  Heparin level 0.3-0.7 units/ml INR 2.5 - 3.5 Monitor platelets by anticoagulation protocol: Yes   Plan:  Will start  heparin if Hgb is stable. Will plan for heparin bolus 4000 units and start heparin infusion at 1050 units/hr. Check heparin level in 6 hours. CBC daily while on heparin. Continue heparin bridge until INR > 2.5.   Warfarin INR is subtherapeutic. Will give warfarin 7.5 mg x 1 tonight (1.5x of home dose), due to held warfarin doses and subtherapeutic INR. Daily INR.    Ronnald Ramp, PharmD, BCPS 12/27/2021,2:21 PM

## 2021-12-28 DIAGNOSIS — K810 Acute cholecystitis: Secondary | ICD-10-CM | POA: Diagnosis not present

## 2021-12-28 LAB — CBC
HCT: 33.3 % — ABNORMAL LOW (ref 39.0–52.0)
Hemoglobin: 10.9 g/dL — ABNORMAL LOW (ref 13.0–17.0)
MCH: 30.8 pg (ref 26.0–34.0)
MCHC: 32.7 g/dL (ref 30.0–36.0)
MCV: 94.1 fL (ref 80.0–100.0)
Platelets: 115 10*3/uL — ABNORMAL LOW (ref 150–400)
RBC: 3.54 MIL/uL — ABNORMAL LOW (ref 4.22–5.81)
RDW: 13.8 % (ref 11.5–15.5)
WBC: 5.3 10*3/uL (ref 4.0–10.5)
nRBC: 0 % (ref 0.0–0.2)

## 2021-12-28 LAB — BASIC METABOLIC PANEL
Anion gap: 3 — ABNORMAL LOW (ref 5–15)
BUN: 15 mg/dL (ref 6–20)
CO2: 28 mmol/L (ref 22–32)
Calcium: 7.9 mg/dL — ABNORMAL LOW (ref 8.9–10.3)
Chloride: 110 mmol/L (ref 98–111)
Creatinine, Ser: 1.26 mg/dL — ABNORMAL HIGH (ref 0.61–1.24)
GFR, Estimated: >60 mL/min (ref >60)
Glucose, Bld: 100 mg/dL — ABNORMAL HIGH (ref 70–99)
Potassium: 4 mmol/L (ref 3.5–5.1)
Sodium: 141 mmol/L (ref 135–145)

## 2021-12-28 LAB — HEPARIN LEVEL (UNFRACTIONATED)
Heparin Unfractionated: 0.28 IU/mL — ABNORMAL LOW (ref 0.30–0.70)
Heparin Unfractionated: 0.51 IU/mL (ref 0.30–0.70)
Heparin Unfractionated: 0.5 IU/mL (ref 0.30–0.70)

## 2021-12-28 LAB — PROTIME-INR
INR: 2.4 — ABNORMAL HIGH (ref 0.8–1.2)
Prothrombin Time: 25.6 seconds — ABNORMAL HIGH (ref 11.4–15.2)

## 2021-12-28 LAB — MAGNESIUM: Magnesium: 1.7 mg/dL (ref 1.7–2.4)

## 2021-12-28 MED ORDER — HEPARIN BOLUS VIA INFUSION
1200.0000 [IU] | Freq: Once | INTRAVENOUS | Status: AC
Start: 2021-12-28 — End: 2021-12-28
  Administered 2021-12-28: 1200 [IU] via INTRAVENOUS
  Filled 2021-12-28: qty 1200

## 2021-12-28 MED ORDER — WARFARIN SODIUM 1 MG PO TABS
1.0000 mg | ORAL_TABLET | Freq: Once | ORAL | Status: AC
  Administered 2021-12-28: 1 mg via ORAL
  Filled 2021-12-28: qty 1

## 2021-12-28 NOTE — Progress Notes (Signed)
Progress Note   Patient: Ryan [name] [id number] DOB: 20-Mar-1966 DOA: 12/25/2021     2 DOS: the patient was seen and examined on 12/28/2021   Brief hospital course: 56 y.o. 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  He presented to the ED on early morning on 12/25/2021 with worsening RUQ abdominal pain.  Patient had been admitted at [facility] from 7/7--12/22/2021 for similar symptoms and was diagnosed with acute cholecystitis.  General surgery at that time recommended conservative management, IV antibiotics, and consideration of surgery in 4 weeks on elective basis due to patient's complicating co-morbid conditions.  Yesterday evening, patient ate a roast beef sandwich from [employer] around 6 PM.  He woke at 1 AM with significantly worsened right upper quadrant pain radiating to the left shoulder arm and back.  He denied fevers or chills.  Denies diarrhea.  Reports dark stools but takes oral iron, no red blood seen in stool.  Patient was admitted to the hospital and started on IV antibiotics for acute cholecystitis.  General surgery and interventional radiology consulted.  Plan is for placement of a percutaneous biliary drain once INR reversed.   Assessment and Plan: * Acute cholecystitis Diagnosed during prior admission last week.  Presented with worsening right upper quadrant pain. Percutaneous biliary drain placed by IR on 7/14 after INR was reversed with FFP and vitamin K -- General surgery, interventional radiology consulted -- Resumed on diet and tolerating -- Treated with IV Zosyn --Transition to Augmentin --Stop IV fluids, tolerating p.o. intake well -- Monitor abdominal  exam, fever curve, and for signs of obstructive jaundice -- Ongoing bleeding from biliary tube, repeat CBC in the morning  RUQ pain Due to acute cholecystitis.  Management as outlined.  Paroxysmal atrial fibrillation (HCC) Heart rate controlled on admission.  Appears not on rate control agents.  Coumadin on hold for IR procedure.  Monitor on telemetry.  On heparin bridge and resumed on warfarin.  Chronic heart failure with preserved ejection fraction (HFpEF) (HCC) Appears euvolemic and compensated on admission.  Echo in March 2023 showed EF 50 to 55%, mild LVH, grade 2 diastolic dysfunction, mild MR. -- Monitor volume status -- ACE inhibitor on hold due to soft BP  History of CVA (cerebrovascular accident) No acute issues. Continue aspirin and statin.  Hx of repair of dissecting thoracic aortic aneurysm, Stanford type A No acute issues. Diligent BP control.  Hyperlipidemia Continue statin  History of heart valve replacement with mechanical valve INR 2.0 on admission.  Interventional radiology needs INR less than 1.5 for placement of percutaneous biliary drain.  Patient was given FFP and vitamin K on 7/15 for biliary drain placement per IR's request -- Daily INR's --INR today 1.8 >> 2.4 -- Continue heparin drip -- Resumed on warfarin per pharmacy dosing  Chronic kidney disease (CKD), stage II (mild) Renal function stable.  Monitor BMP        Subjective: Patient awake sitting up in recliner when seen on rounds.  He reports ongoing pain at the site of his biliary drain that is worse with any movement but okay at rest.  Asked for IV fluids to be stopped given excessive urine output.  Has noticed a little bit of blood in his drain but seems  less than yesterday.  Physical Exam: Vitals:   12/27/21 1742 12/27/21 2154 12/28/21 0522 12/28/21 0811  BP: (!) 118/58 119/66 106/67 132/68  Pulse: (!) 47 (!) 49 (!) 49 (!) 58  Resp: 18 18 16 16   Temp: 97.8 F (36.6 C) 98.6 F (37 C)  97.9 F (36.6 C) 98.1 F (36.7 C)  TempSrc:  Oral Oral Oral  SpO2: 98% 98% 96% 100%  Weight:      Height:       General exam: Awake seated in recliner, no acute distress HEENT: Moist mucous membranes, hearing grossly normal Respiratory system: Lungs clear bilaterally, normal respiratory effort on room air. Cardiovascular system: Regular rate and rhythm, no peripheral edema.   Gastrointestinal system: Biliary drain in place with small amount of bloody fluid in the bag and tubing, abdomen mildly distended generally nontender except site of biliary drain Central nervous system: A&O x3. no gross focal neurologic deficits, normal speech Psychiatry: normal mood, congruent affect, judgement and insight appear normal   Data Reviewed: Notable labs: Glucose 100, creatinine improved 1.26, calcium 7.9, anion gap 3, hemoglobin 10.9 from 12.2 yesterday afternoon, INR 2.4  Family Communication: None, will attempt to call  Disposition: Status is: Inpatient Remains inpatient appropriate because: remains on IV heparin bridge until INR at goal x48 hours given mechanical AVR.  Has some ongoing bleeding from biliary drain that warrants close monitor while resuming anticoagulation.     Planned Discharge Destination: Home    Time spent: 35 minutes  Author: , DO 12/28/2021 2:08 PM  For on call review [email or web address].

## 2021-12-28 NOTE — Consult Note (Addendum)
ANTICOAGULATION CONSULT NOTE  Pharmacy Consult for IV Heparin bridge to Warfarin Indication: atrial fibrillation ISO mAVR  Patient Measurements: Height: 5\' 7"  (170.2 cm) Weight: 78.3 kg (172 lb 9.9 oz) IBW/kg (Calculated) : 66.1 Heparin Dosing Weight: 78.3 kg  Labs: Recent Labs    12/26/21 0607 12/26/21 1120 12/26/21 1501 12/27/21 0204 12/27/21 1408 12/27/21 2253 12/28/21 0455  HGB 12.9*  --   --  11.5* 12.2*  --  10.9*  HCT 37.5*  --   --  33.9* 36.4*  --  33.3*  PLT 140*  --   --  143* 148*  --  115*  APTT  --  37*  --   --   --   --   --   LABPROT 28.3*  --  22.7* 20.4*  --   --  25.6*  INR 2.7*  --  2.0* 1.8*  --   --  2.4*  HEPARINUNFRC  --   --   --   --   --  0.31 0.28*  CREATININE 1.22  --   --  1.41*  --   --  1.26*     Estimated Creatinine Clearance: 62.7 mL/min (A) (by C-G formula based on SCr of 1.26 mg/dL (H)).   Medical History: Past Medical History:  Diagnosis Date   Anemia    Aortic aneurysm and dissection (HCC)    a. 2001 s/p grafting and AVR; b. 09/2021 CTA Chest: Dil Ao root up to 44mm. Ao arch 54mm. Desc Ao 4mm. Complicated arch dissection w/ mult false and/or partially thrombosed lumens. Dissection flaps extend into all great vessels except L carotid. Dissection cont into R CCA and into abd->L RA.   Chronic anticoagulation    Dissecting aortic aneurysm, thoracoabdominal (HCC)    Fatty liver    GERD (gastroesophageal reflux disease)    H/O mechanical aortic valve replacement    a. 2001 s/p AVR in setting of Asc Ao aneurysm repair; b. 08/2021 Echo: EF 50-55%, no rwma, mild LVH, GrII DD, nl RV fxn, mild MR, triv AI w/ mean AoV grad 09/2021.   Heart murmur    History of cardiac catheterization    a. 08/2019 MV: basal-dist lateral and inflat defect; b. 07/2020 Cath: nl cors.   Hyperlipidemia    Hypertension    Hypolipidemia    Iron deficiency    PAF (paroxysmal atrial fibrillation) (HCC)    a. CHA2DS2VASc = 3.  Chronic warfarin (also mech AVR).    Pulmonary nodule    Sleep apnea    Stroke [facility])     Medications:  Warfarin 5 mg MoWeSa & 2.5 mg SuTuThFr Last patient reported dose 7/12  Assessment: Patient is a 56 y/o M with medical history including Afib & mechanical aortic valve replacement on warfarin who is admitted with acute cholecystitis s/p cholecystostomy tube placement on 7/14. Pharmacy consulted to initiate heparin infusion while warfarin is on hold and in setting of subtherapeutic INR.  INR on 7/14 = 2. Patient received Vitamin K 2 mg IV x 1 dose and 1u FFP prior to procedure.  Date INR Warfarin Dose  7/14 2 5  mg (s/p vitamin K IV 2 mg)  7/15 1.8 7.5 mg   7/16 2.4 2.5 mg    7/15 2253 HL 0.31, therapeutic x 1 7/16 0455 HL 0.28, subtherapeutic  Goal of Therapy:  Heparin level 0.3-0.7 units/ml INR 2.5 - 3.5 Monitor platelets by anticoagulation protocol: Yes   Plan:  Heparin Heparin level slightly subtherapeutic. Pt received heparin  bolus of 1200 units x 1 and heparin infusion increased to 1200 units/hr. Check heparin level in 6 hours. CBC daily while on heparin. Continue heparin bridge until INR > 2.5.   Warfarin INR is subtherapeutic but trending up. Surprised INR jumped rapidly even with the vitamin K given 7/14. Will give warfarin 1 mg x 1 tonight (decrease dose from home dose). Predict INR to continue to trend up tomorrow due to the 7.5 mg dose. May need to hold 7/17 dose. Daily INR. Per some note during chart review state INR goal to be 2-3. Will aim for a 2.5-3 goal.    Ronnald Ramp, PharmD, BCPS 12/28/2021,8:05 AM

## 2021-12-28 NOTE — Consult Note (Addendum)
ANTICOAGULATION CONSULT NOTE  Pharmacy Consult for IV Heparin bridge to Warfarin Indication: atrial fibrillation ISO mAVR  Patient Measurements: Height: 5\' 7"  (170.2 cm) Weight: 78.3 kg (172 lb 9.9 oz) IBW/kg (Calculated) : 66.1 Heparin Dosing Weight: 78.3 kg  Labs: Recent Labs    12/26/21 0607 12/26/21 1120 12/26/21 1501 12/27/21 0204 12/27/21 1408 12/27/21 2253 12/28/21 0455 12/28/21 1255 12/28/21 1903  HGB 12.9*  --   --  11.5* 12.2*  --  10.9*  --   --   HCT 37.5*  --   --  33.9* 36.4*  --  33.3*  --   --   PLT 140*  --   --  143* 148*  --  115*  --   --   APTT  --  37*  --   --   --   --   --   --   --   LABPROT 28.3*  --  22.7* 20.4*  --   --  25.6*  --   --   INR 2.7*  --  2.0* 1.8*  --   --  2.4*  --   --   HEPARINUNFRC  --   --   --   --   --    < > 0.28* 0.51 0.50  CREATININE 1.22  --   --  1.41*  --   --  1.26*  --   --    < > = values in this interval not displayed.     Estimated Creatinine Clearance: 62.7 mL/min (A) (by C-G formula based on SCr of 1.26 mg/dL (H)).   Medical History: Past Medical History:  Diagnosis Date   Anemia    Aortic aneurysm and dissection (HCC)    a. 2001 s/p grafting and AVR; b. 09/2021 CTA Chest: Dil Ao root up to 4mm. Ao arch 15mm. Desc Ao 68mm. Complicated arch dissection w/ mult false and/or partially thrombosed lumens. Dissection flaps extend into all great vessels except L carotid. Dissection cont into R CCA and into abd->L RA.   Chronic anticoagulation    Dissecting aortic aneurysm, thoracoabdominal (HCC)    Fatty liver    GERD (gastroesophageal reflux disease)    H/O mechanical aortic valve replacement    a. 2001 s/p AVR in setting of Asc Ao aneurysm repair; b. 08/2021 Echo: EF 50-55%, no rwma, mild LVH, GrII DD, nl RV fxn, mild MR, triv AI w/ mean AoV grad 09/2021.   Heart murmur    History of cardiac catheterization    a. 08/2019 MV: basal-dist lateral and inflat defect; b. 07/2020 Cath: nl cors.   Hyperlipidemia     Hypertension    Hypolipidemia    Iron deficiency    PAF (paroxysmal atrial fibrillation) (HCC)    a. CHA2DS2VASc = 3.  Chronic warfarin (also mech AVR).   Pulmonary nodule    Sleep apnea    Stroke [facility])     Medications:  Warfarin 5 mg MoWeSa & 2.5 mg SuTuThFr Last patient reported dose 7/12  Assessment: Patient is a 55 y/o M with medical history including Afib & mechanical aortic valve replacement on warfarin who is admitted with acute cholecystitis s/p cholecystostomy tube placement on 7/14. Pharmacy consulted to initiate heparin infusion while warfarin is on hold and in setting of subtherapeutic INR.  INR on 7/14 = 2. Patient received Vitamin K 2 mg IV x 1 dose and 1u FFP prior to procedure.  Date INR Warfarin Dose  7/14 2 5  mg (s/p  vitamin K IV 2 mg)  7/15 1.8 7.5 mg   7/16 2.4 1 mg    7/15 2253 HL 0.31, therapeutic x 1 7/16 0455 HL 0.28, subtherapeutic 7/16 1255 HL 0.51, therapeutic.  7/16  1903 HL 0.50, therapeutic  Goal of Therapy:  Heparin level 0.3-0.7 units/ml INR 2.5 - 3.5 Monitor platelets by anticoagulation protocol: Yes   Plan: Heparin level is therapeutic x 2. Will continue heparin infusion at 1200 units/hr. Will move to daily heparin levels. Next heparin level 7/17 AM. CBC daily while on heparin. Continue heparin bridge until INR > 2.5.   Continue warfarin plans as planned on day shift 7/16.   Jaynie Bream, PharmD 12/28/2021,7:31 PM

## 2021-12-28 NOTE — Progress Notes (Signed)
Patient ID: Ryan [name], male   DOB: [birth year], 56 y.o.   MRN: [id number]     SURGICAL PROGRESS NOTE   Hospital Day(s): 2.   Interval History: Patient seen and examined, no acute events or new complaints overnight.  Patient was sleeping previously.  I tried to wake up the patient but he did not wake up.  I press his abdomen without any complaint.  Drain is in place and the bag was almost empty.  As per nurse there was no complaint and he was tolerating diet.  Vital signs in last 24 hours: [min-max] current  Temp:  [97.8 F (36.6 C)-98.6 F (37 C)] 97.9 F (36.6 C) (07/16 0522) Pulse Rate:  [47-56] 49 (07/16 0522) Resp:  [16-18] 16 (07/16 0522) BP: (106-132)/(58-71) 106/67 (07/16 0522) SpO2:  [96 %-100 %] 96 % (07/16 0522)     Height: 5\' 7"  (170.2 cm) Weight: 78.3 kg BMI (Calculated): 27.03   Physical Exam:  Constitutional: alert, cooperative and no distress  Respiratory: breathing non-labored at rest  Cardiovascular: regular rate and sinus rhythm  Gastrointestinal: soft, non-tender, and non-distended  Labs:     Latest Ref Rng & Units 12/28/2021    4:55 AM 12/27/2021    2:08 PM 12/27/2021    2:04 AM  CBC  WBC 4.0 - 10.5 K/uL 5.3  9.3  11.4   Hemoglobin 13.0 - 17.0 g/dL 12/29/2021  48.5  46.2   Hematocrit 39.0 - 52.0 % 33.3  36.4  33.9   Platelets 150 - 400 K/uL 115  148  143       Latest Ref Rng & Units 12/28/2021    4:55 AM 12/27/2021    2:04 AM 12/26/2021    6:07 AM  CMP  Glucose 70 - 99 mg/dL 12/28/2021  500  938   BUN 6 - 20 mg/dL 15  15  12    Creatinine 0.61 - 1.24 mg/dL 182   9.93   Sodium 135 - 145 mmol/L 141  136  137   Potassium 3.5 - 5.1 mmol/L 4.0  4.1  4.6   Chloride 98 - 111 mmol/L 110  105  108   CO2 22 - 32 mmol/L 28  25  25    Calcium 8.9 - 10.3 mg/dL 7.9  8.4  8.8   Total Protein 6.5 - 8.1 g/dL   6.4   Total Bilirubin 0.3 - 1.2 mg/dL   0.7   Alkaline Phos 38 - 126 U/L   48   AST 15 - 41 U/L   26   ALT 0 - 44 U/L   20     Imaging studies: No new pertinent  imaging studies   Assessment/Plan:  56 y.o. male with subacute cholecystitis, complicated by pertinent comorbidities including A-fib, history of aortic dissection status post grafting and aortic valve replacement on chronic Coumadin.   Subacute over chronic cholecystitis -Confirmed by HIDA scan during last admission -Initial onset was 2 weeks ago initially treated with IV antibiotic therapy which seems to improve but now recurred yesterday in less than a week. -Due to the longtime onset of his cholecystitis the amount of inflammation makes the surgery too risky for complications in addition to his chronic medical comorbidities and anticoagulation on Coumadin. -S/p percutaneous cholecystostomy.  Patient seems to be doing well.  There was no tenderness on palpation.  Labs without leukocytosis.  Seems to be tolerating diet. -Patient can be discharged from surgical standpoint if no issues with diet and pain continues  to be controlled once he is medically stable.   Gae Gallop, MD

## 2021-12-28 NOTE — Consult Note (Signed)
ANTICOAGULATION CONSULT NOTE  Pharmacy Consult for IV Heparin bridge to Warfarin Indication: atrial fibrillation ISO mAVR  Patient Measurements: Height: 5\' 7"  (170.2 cm) Weight: 78.3 kg (172 lb 9.9 oz) IBW/kg (Calculated) : 66.1 Heparin Dosing Weight: 78.3 kg  Labs: Recent Labs    12/26/21 0607 12/26/21 1120 12/26/21 1501 12/27/21 0204 12/27/21 1408 12/27/21 2253 12/28/21 0455  HGB 12.9*  --   --  11.5* 12.2*  --  10.9*  HCT 37.5*  --   --  33.9* 36.4*  --  33.3*  PLT 140*  --   --  143* 148*  --  115*  APTT  --  37*  --   --   --   --   --   LABPROT 28.3*  --  22.7* 20.4*  --   --  25.6*  INR 2.7*  --  2.0* 1.8*  --   --  2.4*  HEPARINUNFRC  --   --   --   --   --  0.31 0.28*  CREATININE 1.22  --   --  1.41*  --   --  1.26*     Estimated Creatinine Clearance: 62.7 mL/min (A) (by C-G formula based on SCr of 1.26 mg/dL (H)).   Medical History: Past Medical History:  Diagnosis Date   Anemia    Aortic aneurysm and dissection (HCC)    a. 2001 s/p grafting and AVR; b. 09/2021 CTA Chest: Dil Ao root up to 16mm. Ao arch 57mm. Desc Ao 41mm. Complicated arch dissection w/ mult false and/or partially thrombosed lumens. Dissection flaps extend into all great vessels except L carotid. Dissection cont into R CCA and into abd->L RA.   Chronic anticoagulation    Dissecting aortic aneurysm, thoracoabdominal (HCC)    Fatty liver    GERD (gastroesophageal reflux disease)    H/O mechanical aortic valve replacement    a. 2001 s/p AVR in setting of Asc Ao aneurysm repair; b. 08/2021 Echo: EF 50-55%, no rwma, mild LVH, GrII DD, nl RV fxn, mild MR, triv AI w/ mean AoV grad 09/2021.   Heart murmur    History of cardiac catheterization    a. 08/2019 MV: basal-dist lateral and inflat defect; b. 07/2020 Cath: nl cors.   Hyperlipidemia    Hypertension    Hypolipidemia    Iron deficiency    PAF (paroxysmal atrial fibrillation) (HCC)    a. CHA2DS2VASc = 3.  Chronic warfarin (also mech AVR).    Pulmonary nodule    Sleep apnea    Stroke [facility])     Medications:  Warfarin 5 mg MoWeSa & 2.5 mg SuTuThFr Last patient reported dose 7/12  Assessment: Patient is a 56 y/o M with medical history including Afib & mechanical aortic valve replacement on warfarin who is admitted with acute cholecystitis s/p cholecystostomy tube placement on 7/14. Pharmacy consulted to initiate heparin infusion while warfarin is on hold and in setting of subtherapeutic INR.  INR on 7/14 = 2. Patient received Vitamin K 2 mg IV x 1 dose and 1u FFP prior to procedure.  Date INR Warfarin Dose  7/14 2 5  mg (s/p vitamin K IV 2 mg)  7/15 1.8 7.5 mg      Goal of Therapy:  Heparin level 0.3-0.7 units/ml INR 2.5 - 3.5 Monitor platelets by anticoagulation protocol: Yes  7/15 2253 HL 0.31, therapeutic x 1 7/16 0455 HL 0.28, subtherapeutic   Plan:  Bolus 1200 units x 1 Increase heparin infusion to 1200 units/hr.  Recheck heparin level in 6 hr after rate change. CBC daily while on heparin.  Continue heparin bridge until INR > 2.5.   Warfarin INR is subtherapeutic. Will give warfarin 7.5 mg x 1 tonight (1.5x of home dose), due to held warfarin doses and subtherapeutic INR. Daily INR.    Otelia Sergeant, PharmD, MBA 12/28/2021 6:01 AM

## 2021-12-28 NOTE — Plan of Care (Signed)
  Problem: Clinical Measurements: Goal: Will remain free from infection Outcome: Progressing Goal: Diagnostic test results will improve Outcome: Progressing   Problem: Nutrition: Goal: Adequate nutrition will be maintained Outcome: Progressing   Problem: Pain Managment: Goal: General experience of comfort will improve Outcome: Progressing   

## 2021-12-28 NOTE — Consult Note (Signed)
ANTICOAGULATION CONSULT NOTE  Pharmacy Consult for IV Heparin bridge to Warfarin Indication: atrial fibrillation ISO mAVR  Patient Measurements: Height: 5\' 7"  (170.2 cm) Weight: 78.3 kg (172 lb 9.9 oz) IBW/kg (Calculated) : 66.1 Heparin Dosing Weight: 78.3 kg  Labs: Recent Labs    12/26/21 0607 12/26/21 1120 12/26/21 1501 12/27/21 0204 12/27/21 1408 12/27/21 2253 12/28/21 0455 12/28/21 1255  HGB 12.9*  --   --  11.5* 12.2*  --  10.9*  --   HCT 37.5*  --   --  33.9* 36.4*  --  33.3*  --   PLT 140*  --   --  143* 148*  --  115*  --   APTT  --  37*  --   --   --   --   --   --   LABPROT 28.3*  --  22.7* 20.4*  --   --  25.6*  --   INR 2.7*  --  2.0* 1.8*  --   --  2.4*  --   HEPARINUNFRC  --   --   --   --   --  0.31 0.28* 0.51  CREATININE 1.22  --   --  1.41*  --   --  1.26*  --      Estimated Creatinine Clearance: 62.7 mL/min (A) (by C-G formula based on SCr of 1.26 mg/dL (H)).   Medical History: Past Medical History:  Diagnosis Date   Anemia    Aortic aneurysm and dissection (HCC)    a. 2001 s/p grafting and AVR; b. 09/2021 CTA Chest: Dil Ao root up to 2mm. Ao arch 27mm. Desc Ao 61mm. Complicated arch dissection w/ mult false and/or partially thrombosed lumens. Dissection flaps extend into all great vessels except L carotid. Dissection cont into R CCA and into abd->L RA.   Chronic anticoagulation    Dissecting aortic aneurysm, thoracoabdominal (HCC)    Fatty liver    GERD (gastroesophageal reflux disease)    H/O mechanical aortic valve replacement    a. 2001 s/p AVR in setting of Asc Ao aneurysm repair; b. 08/2021 Echo: EF 50-55%, no rwma, mild LVH, GrII DD, nl RV fxn, mild MR, triv AI w/ mean AoV grad 09/2021.   Heart murmur    History of cardiac catheterization    a. 08/2019 MV: basal-dist lateral and inflat defect; b. 07/2020 Cath: nl cors.   Hyperlipidemia    Hypertension    Hypolipidemia    Iron deficiency    PAF (paroxysmal atrial fibrillation) (HCC)    a.  CHA2DS2VASc = 3.  Chronic warfarin (also mech AVR).   Pulmonary nodule    Sleep apnea    Stroke [facility])     Medications:  Warfarin 5 mg MoWeSa & 2.5 mg SuTuThFr Last patient reported dose 7/12  Assessment: Patient is a 56 y/o M with medical history including Afib & mechanical aortic valve replacement on warfarin who is admitted with acute cholecystitis s/p cholecystostomy tube placement on 7/14. Pharmacy consulted to initiate heparin infusion while warfarin is on hold and in setting of subtherapeutic INR.  INR on 7/14 = 2. Patient received Vitamin K 2 mg IV x 1 dose and 1u FFP prior to procedure.  Date INR Warfarin Dose  7/14 2 5  mg (s/p vitamin K IV 2 mg)  7/15 1.8 7.5 mg   7/16 2.4 1 mg    7/15 2253 HL 0.31, therapeutic x 1 7/16 0455 HL 0.28, subtherapeutic 7/16 1255 HL 0.51, therapeutic.  Goal of Therapy:  Heparin level 0.3-0.7 units/ml INR 2.5 - 3.5 Monitor platelets by anticoagulation protocol: Yes   Plan:  Heparin Heparin level is therapeutic. Will continue heparin infusion at 1200 units/hr. Check heparin level in 6 hours. CBC daily while on heparin. Continue heparin bridge until INR > 2.5.   Warfarin INR is subtherapeutic but trending up. Surprised INR jumped rapidly even with the vitamin K given 7/14. Will give warfarin 1 mg x 1 tonight (decrease dose from home dose). Predict INR to continue to trend up tomorrow due to the 7.5 mg dose. May need to hold 7/17 dose. Daily INR. Per some note during chart review state INR goal to be 2-3. Will aim for a 2.5-3 goal.    Ronnald Ramp, PharmD, BCPS 12/28/2021,1:59 PM
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
## 2021-12-29 DIAGNOSIS — K810 Acute cholecystitis: Secondary | ICD-10-CM | POA: Diagnosis not present

## 2021-12-29 LAB — HEPARIN LEVEL (UNFRACTIONATED): Heparin Unfractionated: 0.44 IU/mL (ref 0.30–0.70)

## 2021-12-29 LAB — BASIC METABOLIC PANEL
Anion gap: 6 (ref 5–15)
BUN: 12 mg/dL (ref 6–20)
CO2: 27 mmol/L (ref 22–32)
Calcium: 8.3 mg/dL — ABNORMAL LOW (ref 8.9–10.3)
Chloride: 106 mmol/L (ref 98–111)
Creatinine, Ser: 1.17 mg/dL (ref 0.61–1.24)
GFR, Estimated: >60 mL/min (ref >60)
Glucose, Bld: 108 mg/dL — ABNORMAL HIGH (ref 70–99)
Potassium: 3.9 mmol/L (ref 3.5–5.1)
Sodium: 139 mmol/L (ref 135–145)

## 2021-12-29 LAB — CBC
HCT: 35.5 % — ABNORMAL LOW (ref 39.0–52.0)
Hemoglobin: 11.9 g/dL — ABNORMAL LOW (ref 13.0–17.0)
MCH: 31.2 pg (ref 26.0–34.0)
MCHC: 33.5 g/dL (ref 30.0–36.0)
MCV: 93.2 fL (ref 80.0–100.0)
Platelets: 126 10*3/uL — ABNORMAL LOW (ref 150–400)
RBC: 3.81 MIL/uL — ABNORMAL LOW (ref 4.22–5.81)
RDW: 13.8 % (ref 11.5–15.5)
WBC: 5.3 10*3/uL (ref 4.0–10.5)
nRBC: 0 % (ref 0.0–0.2)

## 2021-12-29 LAB — PROTIME-INR
INR: 2.4 — ABNORMAL HIGH (ref 0.8–1.2)
Prothrombin Time: 25.7 seconds — ABNORMAL HIGH (ref 11.4–15.2)

## 2021-12-29 LAB — MAGNESIUM: Magnesium: 1.7 mg/dL (ref 1.7–2.4)

## 2021-12-29 MED ORDER — AMOXICILLIN-POT CLAVULANATE 875-125 MG PO TABS
1.0000 | ORAL_TABLET | Freq: Two times a day (BID) | ORAL | Status: DC
  Administered 2021-12-29: 1 via ORAL
  Filled 2021-12-29: qty 1

## 2021-12-29 MED ORDER — OXYCODONE-ACETAMINOPHEN 5-325 MG PO TABS: 1.0000 | ORAL_TABLET | ORAL | 0 refills | Status: DC | PRN

## 2021-12-29 MED ORDER — SODIUM CHLORIDE 0.9 % IV SOLN: INTRAVENOUS | Status: DC | PRN

## 2021-12-29 NOTE — TOC Transition Note (Signed)
Transition of Care [facility]) - CM/SW Discharge Note   Patient Details  Name: Ryan [name] MRN: [id number] Date of Birth: [birth year]  Transition of Care [facility]) CM/SW Contact:  Chapman Fitch, RN Phone Number: 12/29/2021, 12:07 PM   Clinical Narrative:     Patient to discharge today.  Patient confirms he will be arranging and [facility] for transport  Patient confirmed he has his antibiotics already filled at home to continue     Barriers to Discharge: Continued Medical Work up   Patient Goals and CMS Choice        Discharge Placement                       Discharge Plan and Services     Post Acute Care Choice: NA                               Social Determinants of Health (SDOH) Interventions     Readmission Risk Interventions    12/28/2021   11:20 AM 12/22/2021    2:13 PM  Readmission Risk Prevention Plan  Transportation Screening Complete Complete  PCP or Specialist Appt within 5-7 Days  Complete  PCP or Specialist Appt within 3-5 Days Complete   Home Care Screening  Not Complete  Home Care Screening Not Completed Comments  n/a  Medication Review (RN CM)  Complete  Social Work Consult for Recovery Care Planning/Counseling Complete   Palliative Care Screening Not Applicable   Medication Review Oceanographer) Complete

## 2021-12-29 NOTE — Consult Note (Signed)
ANTICOAGULATION CONSULT NOTE  Pharmacy Consult for IV Heparin bridge to Warfarin Indication: atrial fibrillation ISO mAVR  Patient Measurements: Height: 5\' 7"  (170.2 cm) Weight: 78.3 kg (172 lb 9.9 oz) IBW/kg (Calculated) : 66.1 Heparin Dosing Weight: 78.3 kg  Labs: Recent Labs    12/26/21 1120 12/26/21 1501 12/27/21 0204 12/27/21 1408 12/27/21 2253 12/28/21 0455 12/28/21 1255 12/28/21 1903 12/29/21 0455  HGB  --    < > 11.5* 12.2*  --  10.9*  --   --  11.9*  HCT  --    < > 33.9* 36.4*  --  33.3*  --   --  35.5*  PLT  --    < > 143* 148*  --  115*  --   --  126*  APTT 37*  --   --   --   --   --   --   --   --   LABPROT  --    < > 20.4*  --   --  25.6*  --   --  25.7*  INR  --    < > 1.8*  --   --  2.4*  --   --  2.4*  HEPARINUNFRC  --   --   --   --    < > 0.28* 0.51 0.50 0.44  CREATININE  --   --  1.41*  --   --  1.26*  --   --  1.17   < > = values in this interval not displayed.     Estimated Creatinine Clearance: 67.5 mL/min (by C-G formula based on SCr of 1.17 mg/dL).   Medical History: Past Medical History:  Diagnosis Date   Anemia    Aortic aneurysm and dissection (HCC)    a. 2001 s/p grafting and AVR; b. 09/2021 CTA Chest: Dil Ao root up to 41mm. Ao arch 31mm. Desc Ao 78mm. Complicated arch dissection w/ mult false and/or partially thrombosed lumens. Dissection flaps extend into all great vessels except L carotid. Dissection cont into R CCA and into abd->L RA.   Chronic anticoagulation    Dissecting aortic aneurysm, thoracoabdominal (HCC)    Fatty liver    GERD (gastroesophageal reflux disease)    H/O mechanical aortic valve replacement    a. 2001 s/p AVR in setting of Asc Ao aneurysm repair; b. 08/2021 Echo: EF 50-55%, no rwma, mild LVH, GrII DD, nl RV fxn, mild MR, triv AI w/ mean AoV grad 09/2021.   Heart murmur    History of cardiac catheterization    a. 08/2019 MV: basal-dist lateral and inflat defect; b. 07/2020 Cath: nl cors.   Hyperlipidemia     Hypertension    Hypolipidemia    Iron deficiency    PAF (paroxysmal atrial fibrillation) (HCC)    a. CHA2DS2VASc = 3.  Chronic warfarin (also mech AVR).   Pulmonary nodule    Sleep apnea    Stroke [facility])     Medications:  Warfarin 5 mg MoWeSa & 2.5 mg SuTuThFr Last patient reported dose 7/12  Assessment: Patient is a 56 y/o M with medical history including Afib & mechanical aortic valve replacement on warfarin who is admitted with acute cholecystitis s/p cholecystostomy tube placement on 7/14. Pharmacy consulted to initiate heparin infusion while warfarin is on hold and in setting of subtherapeutic INR.  INR on 7/14 = 2. Patient received Vitamin K 2 mg IV x 1 dose and 1u FFP prior to procedure.  Date INR Warfarin Dose  7/14 2 5  mg (s/p vitamin K IV 2 mg)  7/15 1.8 7.5 mg   7/16 2.4 1 mg    7/15 2253 HL 0.31, therapeutic x 1 7/16 0455 HL 0.28, subtherapeutic 7/16 1255 HL 0.51, therapeutic.  7/16  1903 HL 0.50, therapeutic 7/17 0455 HL 0.44, therapeutic   Goal of Therapy:  Heparin level 0.3-0.7 units/ml INR 2.5 - 3.5 Monitor platelets by anticoagulation protocol: Yes   Plan: Heparin level is therapeutic x 3.  Continue heparin infusion at 1200 units/hr Will check HL daily w/ AM while therapeutic CBC daily while on heparin Continue heparin bridge until INR > 2.5.   Otelia Sergeant, PharmD, [facility] 12/29/2021 6:11 AM

## 2021-12-29 NOTE — Discharge Summary (Signed)
Physician Discharge Summary   Patient: Ryan [name] MRN: [id number] DOB: [birth year]  Admit date:     12/25/2021  Discharge date: 12/29/2021  Discharge Physician: Pennie Banter   PCP: Larae Grooms, NP   Recommendations at discharge:    Follow up with general surgery  Follow up with interventional radiology Follow up in coumadin clinic, repeat INR Follow up with Primary Care in 1-2 weeks Repeat CMP, CBC, Mg in 1-2 weeks  Discharge Diagnoses: Principal Problem:   Acute cholecystitis Active Problems:   RUQ pain   Paroxysmal atrial fibrillation (HCC)   Chronic kidney disease (CKD), stage II (mild)   History of heart valve replacement with mechanical valve   Hyperlipidemia   Hx of repair of dissecting thoracic aortic aneurysm, Stanford type A   History of CVA (cerebrovascular accident)   Chronic heart failure with preserved ejection fraction (HFpEF) (HCC)  Resolved Problems:   * No resolved hospital problems. *  Hospital Course: 56 y.o. 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  He presented to the ED on early morning on 12/25/2021 with worsening RUQ abdominal pain.  Patient had been admitted at [facility] from 7/7--12/22/2021 for similar symptoms and was diagnosed with acute cholecystitis.  General surgery at that time recommended conservative management, IV antibiotics, and consideration of surgery in 4 weeks on elective basis due to patient's complicating co-morbid conditions.  Yesterday evening, patient ate a roast beef sandwich from [employer] around 6 PM.  He woke at 1 AM with significantly worsened right upper quadrant pain radiating to the left shoulder arm and  back.  He denied fevers or chills.  Denies diarrhea.  Reports dark stools but takes oral iron, no red blood seen in stool.  Patient was admitted to the hospital and started on IV antibiotics for acute cholecystitis.  General surgery and interventional radiology consulted.  Percutaneous biliary drain placed.   7/17 -- pt continues to do well, no significant symptoms since drain placed other than pain at the drain site.  No N/V and tolerating diet well.  No fever/chills.     Assessment and Plan: * Acute cholecystitis Diagnosed during prior admission last week.  Presented with worsening right upper quadrant pain. Percutaneous biliary drain placed by IR on 7/14 after INR was reversed with FFP and vitamin K -- General surgery, interventional radiology consulted -- Resumed on diet and tolerating -- Treated with IV Zosyn --Transition to Augmentin --Stop IV fluids, tolerating p.o. intake well -- Monitor abdominal exam, fever curve, and for signs of obstructive jaundice -- Ongoing bleeding from biliary tube, repeat CBC in the morning  RUQ pain Due to acute cholecystitis.  Management as outlined.  Paroxysmal atrial fibrillation (HCC) Heart rate controlled on admission.  Appears not on rate control agents.  Coumadin on hold for IR procedure.  Monitor on telemetry.  On heparin bridge and resumed on warfarin.  Chronic heart failure with preserved ejection fraction (HFpEF) (HCC) Appears euvolemic and compensated on admission.  Echo in March 2023 showed EF 50 to 55%, mild LVH, grade 2 diastolic dysfunction, mild MR. -- Monitor volume status -- ACE inhibitor on hold due to soft BP  History of CVA (cerebrovascular accident) No acute issues. Continue aspirin and statin.  Hx of repair  of dissecting thoracic aortic aneurysm, Stanford type A No acute issues. Diligent BP control.  Hyperlipidemia Continue statin  History of heart valve replacement with mechanical valve INR 2.0 on admission.   Interventional radiology needs INR less than 1.5 for placement of percutaneous biliary drain.  Patient was given FFP and vitamin K on 7/15 for biliary drain placement per IR's request -- Daily INR's --INR today 1.8 >> 2.4 -- Continue heparin drip -- Resumed on warfarin per pharmacy dosing  Chronic kidney disease (CKD), stage II (mild) Renal function stable.  Monitor BMP         Consultants: General surgery, Interventional radiology Procedures performed: perc biliary drain placement 7/14   Disposition: Home Diet recommendation:  Cardiac diet DISCHARGE MEDICATION: Allergies as of 12/29/2021       Reactions   Alitraq Rash   Iodinated Contrast Media Rash   Iodine Rash   CT dye CT dye CT dye CT dye        Medication List     STOP taking these medications    lisinopril 10 MG tablet Commonly known as: ZESTRIL       TAKE these medications    aspirin EC 81 MG tablet Take 81 mg by mouth daily.   High Potency Iron 65 MG Tabs Take 2 tablets by mouth daily.   lovastatin 20 MG tablet Commonly known as: MEVACOR Take 1 tablet (20 mg total) by mouth at bedtime.   oxyCODONE-acetaminophen 5-325 MG tablet Commonly known as: PERCOCET/ROXICET Take 1 tablet by mouth every 4 (four) hours as needed for severe pain.   Potassium 99 MG Tabs Take 2 tablets by mouth every evening.   warfarin 5 MG tablet Commonly known as: COUMADIN Take as directed. If you are unsure how to take this medication, talk to your nurse or doctor. Original instructions: Take 0.5-1 tablets (2.5-5 mg total) by mouth daily at 4 PM. Take 5 mg Monday, Weds, Sat. 2.5 mg on all other days       ASK your doctor about these medications    amoxicillin-clavulanate 875-125 MG tablet Commonly known as: AUGMENTIN Take 1 tablet by mouth every 12 (twelve) hours for 14 days. Ask about: Should I take this medication?        Follow-up Information     Herbert Pun, MD Follow up in 4 week(s).    Specialty: General Surgery Why: Follow up cholecystitis with drain in place. Contact information: [place] [address] [phone]         Jon Billings, NP. Schedule an appointment as soon as possible for a visit on 01/01/2022.   Specialty: Nurse Practitioner Why: Hospital follow up 11am Contact information: [place] [id number] [phone]         Nelva Bush, MD. Go on 12/31/2021.   Specialty: Cardiology Why: 11am appointment Contact information: [address] [place] [address] [phone]                Discharge Exam: Danley Danker Weights   12/25/21 0730  Weight: 78.3 kg   General exam: awake, alert, no acute distress HEENT: atraumatic, clear conjunctiva, anicteric sclera, moist mucus membranes, hearing grossly normal  Respiratory system: CTAB, no wheezes, rales or rhonchi, normal respiratory effort. Cardiovascular system: normal S1/S2,  RRR, mechanical valve click noted, no pedal edema.   Gastrointestinal system: biliary drain in place, soft, NT, ND. Central nervous system: A&O x4. no gross focal neurologic deficits, normal speech Extremities: moves all,  no edema, normal tone Skin: dry, intact, normal temperature Psychiatry: normal mood, congruent affect, judgement and insight appear normal   Condition at discharge: stable  The results of significant diagnostics from this hospitalization (including imaging, microbiology, ancillary and laboratory) are listed below for reference.   Imaging Studies: DG Shoulder Right  Result Date: 01/05/2022 CLINICAL DATA:  Right shoulder pain. EXAM: RIGHT SHOULDER - 2+ VIEW COMPARISON:  None Available. FINDINGS: There is no evidence of fracture or dislocation. There is no evidence of arthropathy. A benign-appearing sclerotic area is seen along the medial aspect of the surgical neck of proximal right humerus. Multiple sternal wires are seen. Soft tissues are  unremarkable. IMPRESSION: No acute osseous abnormality. Electronically Signed   By: Virgina Norfolk M.D.   On: 01/05/2022 23:00   CT Head Wo Contrast  Result Date: 01/05/2022 CLINICAL DATA:  Neuro deficit, acute stroke suspected. Mental status change. EXAM: CT HEAD WITHOUT CONTRAST CT CERVICAL SPINE WITHOUT CONTRAST TECHNIQUE: Multidetector CT imaging of the head and cervical spine was performed following the standard protocol without intravenous contrast. Multiplanar CT image reconstructions of the cervical spine were also generated. RADIATION DOSE REDUCTION: This exam was performed according to the departmental dose-optimization program which includes automated exposure control, adjustment of the mA and/or kV according to patient size and/or use of iterative reconstruction technique. COMPARISON:  CT head dated September 06, 2021 FINDINGS: CT HEAD FINDINGS Brain: No evidence of acute infarction, hemorrhage, hydrocephalus, extra-axial collection or mass lesion/mass effect. Mild cerebral and moderate cerebellar atrophy, unchanged. Vascular: No hyperdense vessel or unexpected calcification. Skull: Normal. Negative for fracture or focal lesion. Sinuses/Orbits: No acute finding. Other: None CT CERVICAL SPINE FINDINGS Alignment: Reversal of normal cervical lordosis. Skull base and vertebrae: No acute fracture. No primary bone lesion or focal pathologic process. Soft tissues and spinal canal: No prevertebral fluid or swelling. No visible canal hematoma. Disc levels: Mild degenerate disc disease of the cervical spine. No significant spinal canal or neural foraminal stenosis. 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 Lungs are clear. Other: None IMPRESSION: CT head: 1.  No acute intracranial abnormality. 2.  Mild cerebral and moderate cerebellar atrophy, unchanged. CT cervical spine: 1.  No acute fracture or traumatic subluxation. 2.  Mild degenerate disc disease of the cervical spine. 3.   Aneurysmal dilatation of the aortic arch. Electronically Signed   By: Keane Police D.O.   On: 01/05/2022 22:57   CT Cervical Spine Wo Contrast  Result Date: 01/05/2022 CLINICAL DATA:  Neuro deficit, acute stroke suspected. Mental status change. EXAM: CT HEAD WITHOUT CONTRAST CT CERVICAL SPINE WITHOUT CONTRAST TECHNIQUE: Multidetector CT imaging of the head and cervical spine was performed following the standard protocol without intravenous contrast. Multiplanar CT image reconstructions of the cervical spine were also generated. RADIATION DOSE REDUCTION: This exam was performed according to the departmental dose-optimization program which includes automated exposure control, adjustment of the mA and/or kV according to patient size and/or use of iterative reconstruction technique. COMPARISON:  CT head dated September 06, 2021 FINDINGS: CT HEAD FINDINGS Brain: No evidence of acute infarction, hemorrhage, hydrocephalus, extra-axial collection or mass lesion/mass effect. Mild cerebral and moderate cerebellar atrophy, unchanged. Vascular: No hyperdense vessel or unexpected calcification. Skull: Normal. Negative for fracture or focal lesion. Sinuses/Orbits: No acute finding. Other: None CT CERVICAL SPINE FINDINGS Alignment: Reversal of normal cervical lordosis. Skull base and vertebrae: No acute fracture. No primary bone lesion or focal pathologic process. Soft tissues and spinal canal: No prevertebral fluid  or swelling. No visible canal hematoma. Disc levels: Mild degenerate disc disease of the cervical spine. No significant spinal canal or neural foraminal stenosis. 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 Lungs are clear. Other: None IMPRESSION: CT head: 1.  No acute intracranial abnormality. 2.  Mild cerebral and moderate cerebellar atrophy, unchanged. CT cervical spine: 1.  No acute fracture or traumatic subluxation. 2.  Mild degenerate disc disease of the cervical spine. 3.  Aneurysmal  dilatation of the aortic arch. Electronically Signed   By: Keane Police D.O.   On: 01/05/2022 22:57   DG Chest 2 View  Result Date: 01/05/2022 CLINICAL DATA:  Right shoulder pain. EXAM: CHEST - 2 VIEW COMPARISON:  January 02, 2022 FINDINGS: Multiple sternal wires are present. The heart size and mediastinal contours are within normal limits. There is diffuse calcification of the thoracic aorta. An artificial aortic valve is seen. Both lungs are clear. The visualized skeletal structures are unremarkable. IMPRESSION: 1. Stable exam without active cardiopulmonary disease. Electronically Signed   By: Virgina Norfolk M.D.   On: 01/05/2022 22:57   CT ABDOMEN PELVIS W CONTRAST  Result Date: 01/02/2022 CLINICAL DATA:  Acute generalized abdominal pain. EXAM: CT ABDOMEN AND PELVIS WITH CONTRAST TECHNIQUE: Multidetector CT imaging of the abdomen and pelvis was performed using the standard protocol following bolus administration of intravenous contrast. RADIATION DOSE REDUCTION: This exam was performed according to the departmental dose-optimization program which includes automated exposure control, adjustment of the mA and/or kV according to patient size and/or use of iterative reconstruction technique. CONTRAST:  57mL OMNIPAQUE IOHEXOL 300 MG/ML  SOLN COMPARISON:  December 20, 2021. FINDINGS: Lower chest: Small right posterior basilar subsegmental atelectasis or infiltrate is noted. Hepatobiliary: Interval placement of percutaneous cholecystostomy catheter. No biliary dilatation is noted. Hepatic steatosis is noted. Pancreas: Unremarkable. No pancreatic ductal dilatation or surrounding inflammatory changes. Spleen: Normal in size without focal abnormality. Adrenals/Urinary Tract: Adrenal glands appear normal. Stable small bilateral renal cysts are noted. No hydronephrosis or renal obstruction is noted. No follow-up is required. Urinary bladder is unremarkable. Stomach/Bowel: Stomach is within normal limits. Appendix appears  normal. No evidence of bowel wall thickening, distention, or inflammatory changes. Sigmoid diverticulosis is noted without inflammation. Vascular/Lymphatic: Stable appearance of dissection extending from descending thoracic aorta and through abdominal aorta and into the proximal common iliac arteries bilaterally. No adenopathy is noted. 4.2 cm infrarenal aneurysmal dilatation of aorta is noted. Reproductive: Prostate is unremarkable. Other: Large fat containing left inguinal hernia is noted. No ascites is noted. Musculoskeletal: No acute or significant osseous findings. IMPRESSION: Small right basilar subsegmental atelectasis or infiltrate is noted. Interval placement of percutaneous cholecystostomy tube. Hepatic steatosis. Sigmoid diverticulosis without inflammation. Stable dissection extending from descending thoracic aorta and through abdominal aorta and into the proximal portions of the common iliac arteries bilaterally. Grossly stable 4.2 cm infrarenal abdominal aortic aneurysm. Recommend follow-up every 12 months and vascular consultation. This recommendation follows ACR consensus guidelines: White Paper of the ACR Incidental Findings Committee II on Vascular Findings. J Am Coll Radiol 2013; 10:789-794. Large fat containing left inguinal hernia is noted. Aortic Atherosclerosis (ICD10-I70.0). Electronically Signed   By: Marijo Conception M.D.   On: 01/02/2022 15:28   DG Chest Portable 1 View  Result Date: 01/02/2022 CLINICAL DATA:  Complains of abdominal pain and swelling. EXAM: PORTABLE CHEST 1 VIEW COMPARISON:  11/29/21. FINDINGS: Previous median sternotomy. Dilated thoracic aorta is again noted. Stable mild cardiac enlargement. No pleural effusion or airspace consolidation.  No signs of interstitial edema. IMPRESSION: 1. No acute cardiopulmonary abnormalities.  No evidence for CHF. 2. Chronic dilatation of the thoracic aorta. Electronically Signed   By: Kerby Moors M.D.   On: 01/02/2022 11:03   IR Perc  Cholecystostomy  Result Date: 12/29/2021 INDICATION: 56 year old male with acute on chronic calculus cholecystitis, poor surgical candidate due to comorbidities. EXAM: Fluoroscopic and ultrasound-guided percutaneous cholecystostomy tube placement MEDICATIONS: The patient was receiving intravenous antibiotics as an inpatient, administered in appropriate time frame prior to start of procedure. ANESTHESIA/SEDATION: Moderate (conscious) sedation was employed during this procedure. A total of Versed 2 mg and Fentanyl 100 mcg was administered intravenously. Moderate Sedation Time: 10 minutes. The patient's level of consciousness and vital signs were monitored continuously by radiology nursing throughout the procedure under my direct supervision. FLUOROSCOPY TIME:  8.2 mGy CONTRAST:  5 mL Gadavist, injected into the gallbladder COMPLICATIONS: None immediate. PROCEDURE: Informed written consent was obtained from the patient after a thorough discussion of the procedural risks, benefits and alternatives. All questions were addressed. Maximal Sterile Barrier Technique was utilized including caps, mask, sterile gowns, sterile gloves, sterile drape, hand hygiene and skin antiseptic. A timeout was performed prior to the initiation of the procedure. The patient was placed supine on the angiographic table. The patient's right upper quadrant was then prepped and draped in normal sterile fashion with maximum sterile barrier. Ultrasound demonstrates a distended gallbladder with an echogenic gallstone in the fundus measuring up to proximally 1.5 cm. Subdermal Local anesthesia was provided at the planned skin entry site. Under ultrasound guidance, deeper local anesthetic was provided through intercostal muscles and along the liver capsule. Ultrasound was used to puncture the gallbladder using a Jennings needle via a subhepatic/transperitoneal approach with visualization of the lung treated to the gallbladder. A 0.018 inch wire  was advanced into the lumen and a transition dilator placed. A gentle hand injection of contrast was performed. Cholecystogram demonstrates a filling defect in the fundus consistent with gallstone. The cystic duct is obstructed. A 0.035 inch exchange wire was placed in the tract was dilated. A 10.2 French multipurpose drainage catheter was advanced into the gallbladder lumen. The drain was then secured in place using a 0-silk suture and a Stayfix device. A sterile dressing was applied. The tube was placed to bag drainage. The patient tolerated procedure well without evidence of immediate complication was transferred back to the floor in stable condition. IMPRESSION: Successful placement of percutaneous, subhepatic/transperitoneal 10.2 French cholecystostomy tube. PLAN: If the patient is eventually deemed a poor surgical candidate indefinitely, recommend consideration of interventional radiology referral for evaluation for choledochoscopic-assisted percutaneous gallstone retrieval and eventual tube removal. Ruthann Cancer, MD [facility] Electronically Signed   By: Ruthann Cancer M.D.   On: 12/29/2021 08:29   NM Hepatobiliary Liver Func  Result Date: 12/20/2021 CLINICAL DATA:  Evaluate for acute cholecystitis. EXAM: NUCLEAR MEDICINE HEPATOBILIARY IMAGING TECHNIQUE: Sequential images of the abdomen were obtained out to 60 minutes following intravenous administration of radiopharmaceutical. RADIOPHARMACEUTICALS:  5.25 mCi Tc-68m  Choletec IV COMPARISON:  CT from 12/20/2021 and gallbladder sonogram from 12/19/2021. A. FINDINGS: Prompt uptake and biliary excretion of activity by the liver is seen. Biliary activity passes into small bowel, consistent with patent common bile duct. After 1 hour of imaging no gallbladder activity was visualized. Imaging was performed for an additional 30 minutes without gallbladder activity. IMPRESSION: 1. Nonvisualization of the  gallbladder compatible with cholecystitis. 2. Patent common bile duct. Electronically  Signed   By: Kerby Moors M.D.   On: 12/20/2021 11:39   CT ABDOMEN PELVIS WO CONTRAST  Result Date: 12/20/2021 CLINICAL DATA:  Right-sided abdominal pain for several days, initial encounter EXAM: CT ABDOMEN AND PELVIS WITHOUT CONTRAST TECHNIQUE: Multidetector CT imaging of the abdomen and pelvis was performed following the standard protocol without IV contrast. RADIATION DOSE REDUCTION: This exam was performed according to the departmental dose-optimization program which includes automated exposure control, adjustment of the mA and/or kV according to patient size and/or use of iterative reconstruction technique. COMPARISON:  Ultrasound from the previous day, by report with CT from 11/09/2020. FINDINGS: Lower chest: No acute abnormality. Hepatobiliary: Fatty infiltration of the liver is noted. The gallbladder shows evidence of cholelithiasis similar to that seen on prior ultrasound. No pericholecystic fluid or inflammatory change is seen. Pancreas: Unremarkable. No pancreatic ductal dilatation or surrounding inflammatory changes. Spleen: Normal in size without focal abnormality. Adrenals/Urinary Tract: Adrenal glands are within normal limits. Kidneys show no renal calculi or obstructive changes. Small hypodense exophytic lesions are noted likely representing cysts. No follow-up up is recommended. No obstructive changes are seen. The bladder is well distended. Stomach/Bowel: Scattered diverticular change of the colon is noted. No obstructive changes are seen. The appendix is within normal limits. Small bowel and stomach are within normal limits. Vascular/Lymphatic: Dilatation of the abdominal aorta to 4.3 cm is noted. Chronic changes of dissection are seen. By report this is stable from prior exam of 2022. Evaluation is limited due to lack of IV contrast. Reproductive: Prostate is within normal limits. Other: There is an area of  calcification centrally within the mesentery best seen on image 39 of series 2. Some surrounding inflammatory change in the mesentery is noted. No free fluid is noted. Bilateral fat containing inguinal hernias are noted left greater than right. Musculoskeletal: No acute or significant osseous findings. IMPRESSION: Cholelithiasis without complicating factors. 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 Aneurysmal dilatation of the abdominal aorta to 4.3 cm is noted. Areas of chronic dissection are seen. These are stable in appearance when compared with prior CT by report from 11/09/2020. Recommend follow-up every 12 months and vascular consultation. This recommendation follows ACR consensus guidelines: White Paper of the ACR Incidental Findings Committee II on Vascular Findings. J Am Coll Radiol 2013; 10:789-794. Area of calcification in the mesentery with some surrounding inflammatory change also stable in appearance from prior CT examination from May of 2022. This is likely related to chronic scarring and possible fat necrosis. Electronically Signed   By: Inez Catalina M.D.   On: 12/20/2021 00:30   US ABDOMEN LIMITED RUQ (LIVER/GB)  Result Date: 12/19/2021 CLINICAL DATA:  Right upper quadrant pain EXAM: ULTRASOUND ABDOMEN LIMITED RIGHT UPPER QUADRANT COMPARISON:  None Available. FINDINGS: Gallbladder: Borderline gallbladder wall thickening. There is a shadowing calculus measuring 1.4 cm and sludge. Positive sonographic Murphy sign. Common bile duct: Diameter: 0.5 cm, within normal limits Liver: No focal lesion identified. Diffusely increased parenchymal echogenicity. Portal vein is patent on color Doppler imaging with normal direction of blood flow towards the liver. Other: None. IMPRESSION: 1. Cholelithiasis with borderline gallbladder wall thickening and positive sonographic Murphy sign. Findings are indeterminate though could represent early acute cholecystitis. 2. Diffusely increased liver parenchymal  echogenicity which is nonspecific but most commonly seen with hepatic steatosis. Electronically Signed   By: Audie Pinto M.D.   On: 12/19/2021 15:21   NM Myocar Multi W/Spect W/Wall Motion / EF  Result Date: 12/12/2021   Abnormal, potentially high risk  pharmacologic myocardial perfusion stress test.   There is a large, fixed defect involving the inferior and inferolateral walls most consistent with scar but cannot rule out an element of artifact.   There is no evidence of significant ischemia.   Left ventricular systolic function is mildly to moderately reduced with basal and mid inferior hypokinesis (LVEF 37% by QGS, 50% by Siemens calculation).   Attenuation correction CT demonstates sequelae of aortic valve replacement and ascending aorta repair.  There is no significant coronary artery calcification.   Incidental note is made of aortic atherosclarosis and hepatic steatosis.   Transient ischemic dilation is noted (TID 1.35).  While non-specific, this can be seen with balanced ischemia.    Microbiology: Results for orders placed or performed during the hospital encounter of 08/26/21  Resp Panel by RT-PCR (Flu A&B, Covid) Nasopharyngeal Swab     Status: None   Collection Time: 08/26/21  8:36 PM   Specimen: Nasopharyngeal Swab; Nasopharyngeal(NP) swabs in vial transport medium  Result Value Ref Range Status   SARS Coronavirus 2 by RT PCR NEGATIVE NEGATIVE Final    Comment: (NOTE) SARS-CoV-2 target nucleic acids are NOT DETECTED.  The SARS-CoV-2 RNA is generally detectable in upper respiratory specimens during the acute phase of infection. The lowest concentration of SARS-CoV-2 viral copies this assay can detect is 138 copies/mL. A negative result does not preclude SARS-Cov-2 infection and should not be used as the sole basis for treatment or other patient management decisions. A negative result may occur with  improper specimen collection/handling, submission of specimen other than  nasopharyngeal swab, presence of viral mutation(s) within the areas targeted by this assay, and inadequate number of viral copies(<138 copies/mL). A negative result must be combined with clinical observations, patient history, and epidemiological information. The expected result is Negative.  Fact Sheet for Patients:  BloggerCourse.com  Fact Sheet for Healthcare Providers:  SeriousBroker.it  This test is no t yet approved or cleared by the [place] FDA and  has been authorized for detection and/or diagnosis of SARS-CoV-2 by FDA under an Emergency Use Authorization (EUA). This EUA will remain  in effect (meaning this test can be used) for the duration of the COVID-19 declaration under Section 564(b)(1) of the Act, 21 U.S.C.section 360bbb-3(b)(1), unless the authorization is terminated  or revoked sooner.       Influenza A by PCR NEGATIVE NEGATIVE Final   Influenza B by PCR NEGATIVE NEGATIVE Final    Comment: (NOTE) The Xpert Xpress SARS-CoV-2/FLU/RSV plus assay is intended as an aid in the diagnosis of influenza from Nasopharyngeal swab specimens and should not be used as a sole basis for treatment. Nasal washings and aspirates are unacceptable for Xpert Xpress SARS-CoV-2/FLU/RSV testing.  Fact Sheet for Patients: BloggerCourse.com  Fact Sheet for Healthcare Providers: SeriousBroker.it  This test is not yet approved or cleared by the [place] FDA and has been authorized for detection and/or diagnosis of SARS-CoV-2 by FDA under an Emergency Use Authorization (EUA). This EUA will remain in effect (meaning this test can be used) for the duration of the COVID-19 declaration under Section 564(b)(1) of the Act, 21 U.S.C. section 360bbb-3(b)(1), unless the authorization is terminated or revoked.  Performed at [facility], [address] [place], [place]  [address]     Labs: CBC: Recent Labs  Lab 12/31/21 1341 01/02/22 1032 01/05/22 2221  WBC 5.4 6.3  6.2 7.1  NEUTROABS 3.6 4.3 5.6  HGB 12.7* 12.8*  12.8* 13.4  HCT 38.2 38.8*  38.7* 40.4  MCV 96 95.8  95.1 93.5  PLT 136* 168  166 [id number]*   Basic Metabolic Panel: Recent Labs  Lab 12/31/21 1341 01/02/22 1032 01/05/22 2221  NA 138 137 137  K 3.9 4.7 3.8  CL 102 104 103  CO2 24 26 27   GLUCOSE 99 115* 110*  BUN 9 12 14   CREATININE 0.95 1.12 1.16  CALCIUM 9.0 9.0 8.9   Liver Function Tests: Recent Labs  Lab 12/31/21 1341 01/02/22 1032 01/05/22 2221  AST 27 23 33  ALT 21 24 25   ALKPHOS 58 53 66  BILITOT 0.5 0.8 1.1  PROT 6.3 7.1 7.6  ALBUMIN 4.0 4.0 4.2   CBG: No results for input(s): "GLUCAP" in the last 168 hours.  Discharge time spent: less than 30 minutes.  Signed: Ezekiel Slocumb, DO [facility] 01/06/2022

## 2021-12-29 NOTE — Plan of Care (Signed)
Patient discharged to home with drain.  Supplies given and patient verbalized understanding of care.Discharge instructions given and reviewed PIV removed prior to discharge with tip intact.

## 2021-12-31 ENCOUNTER — Ambulatory Visit (INDEPENDENT_AMBULATORY_CARE_PROVIDER_SITE_OTHER): Payer: Medicaid Other

## 2021-12-31 ENCOUNTER — Ambulatory Visit: Payer: Medicaid Other | Admitting: Internal Medicine

## 2021-12-31 ENCOUNTER — Ambulatory Visit (INDEPENDENT_AMBULATORY_CARE_PROVIDER_SITE_OTHER): Payer: Medicaid Other | Admitting: Internal Medicine

## 2021-12-31 ENCOUNTER — Encounter: Payer: Self-pay | Admitting: Internal Medicine

## 2021-12-31 ENCOUNTER — Ambulatory Visit (INDEPENDENT_AMBULATORY_CARE_PROVIDER_SITE_OTHER): Payer: Medicaid Other | Admitting: Nurse Practitioner

## 2021-12-31 ENCOUNTER — Encounter: Payer: Self-pay | Admitting: Nurse Practitioner

## 2021-12-31 VITALS — BP 122/67 | HR 56 | Temp 98.1°F | Wt 179.2 lb

## 2021-12-31 VITALS — BP 132/68 | HR 54 | Ht 67.0 in | Wt 178.8 lb

## 2021-12-31 DIAGNOSIS — Z952 Presence of prosthetic heart valve: Secondary | ICD-10-CM

## 2021-12-31 DIAGNOSIS — K810 Acute cholecystitis: Secondary | ICD-10-CM

## 2021-12-31 DIAGNOSIS — Z09 Encounter for follow-up examination after completed treatment for conditions other than malignant neoplasm: Secondary | ICD-10-CM

## 2021-12-31 DIAGNOSIS — Z7901 Long term (current) use of anticoagulants: Secondary | ICD-10-CM

## 2021-12-31 DIAGNOSIS — I5032 Chronic diastolic (congestive) heart failure: Secondary | ICD-10-CM | POA: Diagnosis not present

## 2021-12-31 DIAGNOSIS — R072 Precordial pain: Secondary | ICD-10-CM | POA: Diagnosis not present

## 2021-12-31 DIAGNOSIS — Z5181 Encounter for therapeutic drug level monitoring: Secondary | ICD-10-CM

## 2021-12-31 LAB — POCT INR: INR: 1.7 — AB (ref 2.0–3.0)

## 2021-12-31 MED ORDER — LISINOPRIL 5 MG PO TABS: 5.0000 mg | ORAL_TABLET | Freq: Every day | ORAL | Status: DC

## 2021-12-31 NOTE — Assessment & Plan Note (Signed)
Ongoing. Pain has improved from hospitalization. Has pain meds to use PRN. Continue with Augumentin.  Keep follow up with General Surgery. Follow up if symptoms do not improve.  Return to ED if symptoms worsen or drain fails.

## 2021-12-31 NOTE — Progress Notes (Signed)
BP 122/67   Pulse (!) 56   Temp 98.1 F (36.7 C) (Oral)   Wt 179 lb 3.2 oz (81.3 kg)   SpO2 97%   BMI 28.07 kg/m    Subjective:    Patient ID: Ryan [name], male    DOB: 1966/02/23, 56 y.o.   MRN: [id number]  HPI: Ryan [name] is a 56 y.o. male  Chief Complaint  Patient presents with   Hospitalization Follow-up    Patient would like drainage tube looked at, states bil feet swelling   Transition of [facility] Follow up.   Hospital/Facility: [facility] D/C Physician: Dr. Arbutus Ped D/C Date: 12/29/2021  Records Requested: NA Records Received: Yes Records Reviewed: NA  Diagnoses on Discharge: Acute Cystitis   Date of interactive Contact within 48 hours of discharge:  Contact was through: phone  Date of 7 day or 14 day face-to-face visit:    within 7 days  Outpatient Encounter Medications as of 12/31/2021  Medication Sig   amoxicillin-clavulanate (AUGMENTIN) 875-125 MG tablet Take 1 tablet by mouth every 12 (twelve) hours for 14 days.   aspirin 81 MG EC tablet Take 81 mg by mouth daily.   Ferrous Sulfate Dried (HIGH POTENCY IRON) 65 MG TABS Take 2 tablets by mouth daily.   lisinopril (ZESTRIL) 5 MG tablet Take 1 tablet (5 mg total) by mouth daily.   lovastatin (MEVACOR) 20 MG tablet Take 1 tablet (20 mg total) by mouth at bedtime.   oxyCODONE-acetaminophen (PERCOCET/ROXICET) 5-325 MG tablet Take 1 tablet by mouth every 4 (four) hours as needed for severe pain.   Potassium 99 MG TABS Take 2 tablets by mouth every evening.   warfarin (COUMADIN) 5 MG tablet Take 0.5-1 tablets (2.5-5 mg total) by mouth daily at 4 PM. Take 5 mg Monday, Weds, Sat. 2.5 mg on all other days   No facility-administered encounter medications on file as of 12/31/2021.    Diagnostic Tests Reviewed/Disposition: Yes  * Acute cholecystitis Diagnosed during prior admission last week.  Presented with worsening right upper quadrant pain. Percutaneous biliary drain placed by IR on 7/14 after INR was reversed  with FFP and vitamin K -- General surgery, interventional radiology consulted -- Resumed on diet and tolerating- tolerating PO well --Transition to Augmentin- still taking medication -- Has follow up with Dr Ferrel Logan on August 8   RUQ pain Due to acute cholecystitis.  Management as outlined.   Paroxysmal atrial fibrillation (HCC) Heart rate controlled on admission.  Appears not on rate control agents.     Chronic heart failure with preserved ejection fraction (HFpEF) ([place]) Appears euvolemic and compensated on admission.  Echo in March 2023 showed EF 50 to 55%, mild LVH, grade 2 diastolic dysfunction, mild MR. -- Monitor volume status -- ACE inhibitor on hold due to soft BP   History of CVA (cerebrovascular accident) No acute issues. Continue aspirin and statin.   Hx of repair of dissecting thoracic aortic aneurysm, Stanford type A No acute issues. Diligent BP control.   Hyperlipidemia Continue statin   History of heart valve replacement with mechanical valve INR 2.0 on admission.  Interventional radiology needs INR less than 1.5 for placement of percutaneous biliary drain.  Patient was given FFP and vitamin K on 7/15 for biliary drain placement per IR's request -- Daily INR's --INR today 1.8 >> 2.4 -- Continue heparin drip -- Resumed on warfarin per pharmacy dosing   Chronic kidney disease (CKD), stage II (mild) Renal function stable.  Monitor BMP  Consults: General Surgery  Discharge Instructions: Reviewed during visit  Disease/illness Education: Reviewed during visit  Home Health/Community Services Discussions/Referrals: Reviewed during visit  Establishment or re-establishment of referral orders for community resources: Reviewed during visit  Discussion with other health care providers: General Surgery  Assessment and Support of treatment regimen adherence: Reviewed during visit  Appointments Coordinated with: Patient  Education for self-management, independent  living, and ADLs: Reviewed during visit  Relevant past medical, surgical, family and social history reviewed and updated as indicated. Interim medical history since our last visit reviewed. Allergies and medications reviewed and updated.  Review of Systems  Gastrointestinal:  Positive for abdominal distention and abdominal pain.    Per HPI unless specifically indicated above     Objective:    BP 122/67   Pulse (!) 56   Temp 98.1 F (36.7 C) (Oral)   Wt 179 lb 3.2 oz (81.3 kg)   SpO2 97%   BMI 28.07 kg/m   Wt Readings from Last 3 Encounters:  12/31/21 179 lb 3.2 oz (81.3 kg)  12/31/21 178 lb 12.8 oz (81.1 kg)  12/25/21 172 lb 9.9 oz (78.3 kg)    Physical Exam Vitals and nursing note reviewed.  Constitutional:      General: He is not in acute distress.    Appearance: Normal appearance. He is not ill-appearing, toxic-appearing or diaphoretic.  HENT:     Head: Normocephalic.     Right Ear: External ear normal.     Left Ear: External ear normal.     Nose: Nose normal. No congestion or rhinorrhea.     Mouth/Throat:     Mouth: Mucous membranes are moist.  Eyes:     General:        Right eye: No discharge.        Left eye: No discharge.     Extraocular Movements: Extraocular movements intact.     Conjunctiva/sclera: Conjunctivae normal.     Pupils: Pupils are equal, round, and reactive to light.  Cardiovascular:     Rate and Rhythm: Normal rate and regular rhythm.     Heart sounds: No murmur heard. Pulmonary:     Effort: Pulmonary effort is normal. No respiratory distress.     Breath sounds: Normal breath sounds. No wheezing, rhonchi or rales.  Abdominal:     General: Abdomen is flat. Bowel sounds are normal. There is distension.     Tenderness: There is abdominal tenderness.  Musculoskeletal:     Cervical back: Normal range of motion and neck supple.  Skin:    General: Skin is warm and dry.     Capillary Refill: Capillary refill takes less than 2 seconds.   Neurological:     General: No focal deficit present.     Mental Status: He is alert and oriented to person, place, and time.  Psychiatric:        Mood and Affect: Mood normal.        Behavior: Behavior normal.        Thought Content: Thought content normal.        Judgment: Judgment normal.     Results for orders placed or performed in visit on 12/31/21  POCT INR  Result Value Ref Range   INR 1.7 (A) 2.0 - 3.0      Assessment & Plan:   Problem List Items Addressed This Visit       Digestive   Acute cholecystitis    Ongoing. Pain has improved from hospitalization. Has pain  meds to use PRN. Continue with Augumentin.  Keep follow up with General Surgery. Follow up if symptoms do not improve.  Return to ED if symptoms worsen or drain fails.        Relevant Orders   CBC w/Diff   Comp Met (CMET)   Other Visit Diagnoses     Hospital discharge follow-up    -  Primary   CMP and CBC reviewed during visit today. Has drain in place from IR. Has follow up with General Surgery on AUgust 8.        Follow up plan: No follow-ups on file.

## 2021-12-31 NOTE — Patient Instructions (Signed)
Take 2 tablets today, then resume same dosage of Warfarin 1/2 tablet every day EXCEPT 1 tablet on MONDAYS, WEDNESDAYS & SATURDAYS. - Recheck INR in 1 week.

## 2021-12-31 NOTE — Patient Instructions (Signed)
Medication Instructions:   Your physician has recommended you make the following change in your medication:   RESTART Lisinopril at 5 mg daily ( you may cut current tablet in half for total of 5 mg until need refill)  *If you need a refill on your cardiac medications before your next appointment, please call your pharmacy*   Lab Work:  None ordered  Testing/Procedures:  None ordered   Follow-Up: At [facility], you and your health needs are our priority.  As part of our continuing mission to provide you with exceptional heart care, we have created designated Provider Care Teams.  These Care Teams include your primary Cardiologist (physician) and Advanced Practice Providers (APPs -  Physician Assistants and Nurse Practitioners) who all work together to provide you with the care you need, when you need it.  We recommend signing up for the patient portal called "MyChart".  Sign up information is provided on this After Visit Summary.  MyChart is used to connect with patients for Virtual Visits (Telemedicine).  Patients are able to view lab/test results, encounter notes, upcoming appointments, etc.  Non-urgent messages can be sent to your provider as well.   To learn more about what you can do with MyChart, go to [email or web address].    Your next appointment:    Keep follow up as scheduled in September with Dr. Okey Dupre  Important Information About Sugar

## 2021-12-31 NOTE — Progress Notes (Signed)
Follow-up Outpatient Visit Date: 12/31/2021  Primary Care Provider: Larae Grooms, NP [address] [place] [address]  Chief Complaint: Follow-up chest pain  HPI:  Mr. Gulino is a 56 y.o. 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  He was last seen in our office in late June by Ward Givens, NP, at which time Mr. Karlene Lineman recounted an episode of sudden severe left lateral chest pain that awoke him from sleep.  He was evaluated in the ED, with work-up being notable only for mild troponin elevation that was flat (44, 42).  At his visit in late June, he continued to report mild chest pain that has been chronic following his aortic dissection repair in 2021.  Subsequent stress test was abnormal, showing a large inferior/inferolateral defect most consistent with scar.  3 times daily was noted.  Given relatively stable findings compared to multiple prior outside stress test, medical therapy was recommended.  He has been seen in the ED and hospital several times over the last month for chest and right upper quadrant pain (last discharge 2 days ago).  He was diagnosed with acute cholecystitis earlier this month; conservative therapy with potential delayed surgery was recommended.  He underwent percutaneous cholecystostomy tube placement last week.  Today, Mr. Pooley reports that he is feeling somewhat better with less abdominal pain.  He still feels a little bit tense and is sore around the site of his biliary drain.  He is concerned that it has not been draining much over the last day.  He is planning to  visit his surgeon's office later today for evaluation.  He denies fevers and chills as well as nausea and vomiting.  His chronic 2-3/10 chest pain that has been present ever since his aortic/valve surgery in 2021 is stable.  He has not had any further bouts of severe chest pain.  He denies shortness of breath, palpitations, and lightheadedness.  Leg edema has been minimal.  He notes that lisinopril was discontinued during his recent hospitalization (presumably due to soft blood pressure).  Since returning home, his blood pressures have been trending up.  --------------------------------------------------------------------------------------------------  Cardiovascular History & Procedures: Cardiovascular Problems: Aortic dissection status post root repair and mechanical AVR with residual chronic dissection extending from innominate artery through the aortic bifurcation Stroke Chronic left bundle branch block   Risk Factors: Stroke, hypertension, hyperlipidemia, and male gender   Cath/PCI: LHC (07/22/2020, [facility]): LMCA normal.  LAD normal.  LCx normal.  Dominant RCA normal.   CV Surgery: Aortic root repair and mechanical AVR (2001),   EP Procedures and Devices: None   Non-Invasive Evaluation(s): Pharmacologic MPI (12/12/2021): Abnormal, potentially high risk pharmacologic myocardial perfusion stress test.  There is a large, fixed defect involving the inferior and inferolateral walls most consistent with scar but cannot rule out an element of artifact.  No significant ischemia identified.  LVEF 50% by Siemens calculation.  Transient ischemic dilation noted (TID1.35). CTA chest (09/28/2021): Complex aortic dissection with aortic valve replacement.  Dilated aortic root measuring up to 5.1 cm, aortic arch measuring up to 4.6 cm and descending aorta measuring up to 5.4 cm.  Dissections extend into all great vessels except  for the left common carotid artery.  Duplicated SVC noted. TTE  (08/27/2021): Normal LV size with mild LVH.  LVEF 50-55% with grade 2 diastolic dysfunction.  Mildly dilated RV with normal contraction.  Mild right atrial enlargement.  Mild mitral regurgitation.  Mechanical aortic valve present with mean gradient 7 mmHg.  Normal CVP. CTA chest, abdomen, and pelvis (04/25/2021, [facility]): Patient status post AVR and ascending aortic graft.  Residual dissection in the arch to the level of the bilateral common iliac arteries.  Aortic root measures up to 5.0 cm.  Arch and descending aorta measure up to 4.8 cm.  Findings are stable from prior study in 01/2021. TTE (02/05/2021, [facility]): Technically difficult study.  Mildly dilated left ventricle with LVEF 55-60% with abnormal septal motion.  Moderate left atrial enlargement.  Normal mechanical AVR gradient (mean gradient 7 mmHg) with mild regurgitation.  Recent CV Pertinent Labs: Lab Results  Component Value Date   CHOL 81 (L) 09/29/2021   HDL 28 (L) 09/29/2021   LDLCALC 18 09/29/2021   TRIG 232 (H) 09/29/2021   CHOLHDL 2.9 09/29/2021   CHOLHDL 3.5 08/27/2021   INR 2.4 (H) 12/29/2021   K 3.9 12/29/2021   MG 1.7 12/29/2021   BUN 12 12/29/2021   BUN 11 09/29/2021   CREATININE 1.17 12/29/2021    Past medical and surgical history were reviewed and updated in EPIC.  Current Meds  Medication Sig   amoxicillin-clavulanate (AUGMENTIN) 875-125 MG tablet Take 1 tablet by mouth every 12 (twelve) hours for 14 days.   aspirin 81 MG EC tablet Take 81 mg by mouth daily.   Ferrous Sulfate Dried (HIGH POTENCY IRON) 65 MG TABS Take 2 tablets by mouth daily.   lovastatin (MEVACOR) 20 MG tablet Take 1 tablet (20 mg total) by mouth at bedtime.   oxyCODONE-acetaminophen (PERCOCET/ROXICET) 5-325 MG tablet Take 1 tablet by mouth every 4 (four) hours as needed for severe pain.   Potassium 99 MG TABS Take 2 tablets by mouth every evening.   warfarin (COUMADIN) 5 MG tablet Take 0.5-1 tablets (2.5-5 mg total) by mouth daily  at 4 PM. Take 5 mg Monday, Weds, Sat. 2.5 mg on all other days    Allergies: Alitraq, Iodinated contrast media, and Iodine  Social History   Tobacco Use   Smoking status: Never    Passive exposure: Past   Smokeless tobacco: Never  Vaping Use   Vaping Use: Never used  Substance Use Topics   Alcohol use: Yes    Comment: 2 mixed drinks per year   Drug use: Never    Family History  Problem Relation Age of Onset   Heart attack Mother    Hypertension Mother    Hyperlipidemia Mother    Heart disease Mother    Heart attack Maternal Grandfather     Review of Systems: A 12-system review of systems was performed and was negative except as noted in the HPI.  --------------------------------------------------------------------------------------------------  Physical Exam: BP 132/68 (BP Location: Left Arm, Patient Position: Sitting, Cuff Size: Normal)   Pulse (!) 54   Ht 5\' 7"  (1.702 m)   Wt 178 lb 12.8 oz (81.1 kg)   SpO2 98%   BMI 28.00 kg/m   General:  NAD. Neck: No JVD or HJR. Lungs: Clear to auscultation bilaterally without wheezes or crackles. Heart: Regular rate and rhythm without murmurs, rubs, or gallops. Abdomen: Soft with mild right upper quadrant tenderness surrounding gallbladder drain.  Small amount of serosanguineous drainage is noted in  the bag. Extremities: No lower extremity edema.  EKG: Sinus bradycardia with left bundle branch block.  No significant change since 12/25/2021.  Lab Results  Component Value Date   WBC 5.3 12/29/2021   HGB 11.9 (L) 12/29/2021   HCT 35.5 (L) 12/29/2021   MCV 93.2 12/29/2021   PLT 126 (L) 12/29/2021    Lab Results  Component Value Date   NA 139 12/29/2021   K 3.9 12/29/2021   CL 106 12/29/2021   CO2 27 12/29/2021   BUN 12 12/29/2021   CREATININE 1.17 12/29/2021   GLUCOSE 108 (H) 12/29/2021   ALT 20 12/26/2021    Lab Results  Component Value Date   CHOL 81 (L) 09/29/2021   HDL 28 (L) 09/29/2021   LDLCALC 18  09/29/2021   TRIG 232 (H) 09/29/2021   CHOLHDL 2.9 09/29/2021    --------------------------------------------------------------------------------------------------  ASSESSMENT AND PLAN: Chronic chest pain, chronic aortic dissection, and history of mechanical aortic valve replacement: Overall, symptoms are fairly stable.  Myocardial perfusion stress test last month was abnormal with large inferior/inferolateral fixed defect most suggestive of scar.  Based on outside reports, this is not significantly different compared to 2021.  Subsequent catheterization showed absence of CAD.  We will defer additional work-up at this time, as chronic chest pain is most likely postoperative and/or related to chronic aortic dissection.  Mr. Golson should continue to follow closely with Dr. Laneta Simmers (cardiac surgery).  We will reinitiate lisinopril to ensure adequate blood pressure control.  Ongoing anticoagulation with warfarin and aspirin through our [facility] clinic.  Hypertension: Blood pressure upper normal today, trending up after recent hospitalization.  We will restart lisinopril at 5 mg daily.  I have asked Mr. Lamm to continue monitoring his blood pressure at home and to alert [place] if it does not improve.  Cholecystitis: Overall improved after cholecystostomy placement last week.  I am concerned about scant serosanguineous output from the drain.  I encouraged Mr. Clugston to follow-up with his surgeon as soon as possible.  From a heart standpoint, I think it would be reasonable for Mr. Michalowski to proceed with cholecystectomy when it is felt appropriate by his surgical team.  We would need to coordinate temporary discontinuation of anticoagulation +/- bridging with our pharmacy team.  Hyperlipidemia: LDL excellent on last check.  Triglycerides were mildly elevated.  Continue current dose of lovastatin.  Paroxysmal atrial fibrillation: Mr. Brigham is maintaining sinus rhythm.  Continue anticoagulation with warfarin in the setting of  mechanical AVR.  No need for AV nodal blocking agents at this time given resting heart rate in the 50s again today.  Follow-up: Return to clinic in early 02/2022, as previously scheduled.  Yvonne Kendall, MD 12/31/2021 9:07 AM

## 2022-01-01 ENCOUNTER — Encounter: Payer: Self-pay | Admitting: Internal Medicine

## 2022-01-01 ENCOUNTER — Inpatient Hospital Stay: Payer: Medicaid Other | Admitting: Family Medicine

## 2022-01-01 LAB — CBC WITH DIFFERENTIAL/PLATELET
Basophils Absolute: 0 10*3/uL (ref 0.0–0.2)
Basos: 1 %
EOS (ABSOLUTE): 0.1 10*3/uL (ref 0.0–0.4)
Eos: 2 %
Hematocrit: 38.2 % (ref 37.5–51.0)
Hemoglobin: 12.7 g/dL — ABNORMAL LOW (ref 13.0–17.7)
Immature Grans (Abs): 0.2 10*3/uL — ABNORMAL HIGH (ref 0.0–0.1)
Immature Granulocytes: 4 %
Lymphocytes Absolute: 1.1 10*3/uL (ref 0.7–3.1)
Lymphs: 21 %
MCH: 31.8 pg (ref 26.6–33.0)
MCHC: 33.2 g/dL (ref 31.5–35.7)
MCV: 96 fL (ref 79–97)
Monocytes Absolute: 0.4 10*3/uL (ref 0.1–0.9)
Monocytes: 6 %
Neutrophils Absolute: 3.6 10*3/uL (ref 1.4–7.0)
Neutrophils: 66 %
Platelets: 136 10*3/uL — ABNORMAL LOW (ref 150–450)
RBC: 3.99 x10E6/uL — ABNORMAL LOW (ref 4.14–5.80)
RDW: 13.7 % (ref 11.6–15.4)
WBC: 5.4 10*3/uL (ref 3.4–10.8)

## 2022-01-01 LAB — COMPREHENSIVE METABOLIC PANEL
ALT: 21 IU/L (ref 0–44)
AST: 27 IU/L (ref 0–40)
Albumin/Globulin Ratio: 1.7 (ref 1.2–2.2)
Albumin: 4 g/dL (ref 3.8–4.9)
Alkaline Phosphatase: 58 IU/L (ref 44–121)
BUN/Creatinine Ratio: 9 (ref 9–20)
BUN: 9 mg/dL (ref 6–24)
Bilirubin Total: 0.5 mg/dL (ref 0.0–1.2)
CO2: 24 mmol/L (ref 20–29)
Calcium: 9 mg/dL (ref 8.7–10.2)
Chloride: 102 mmol/L (ref 96–106)
Creatinine, Ser: 0.95 mg/dL (ref 0.76–1.27)
Globulin, Total: 2.3 g/dL (ref 1.5–4.5)
Glucose: 99 mg/dL (ref 70–99)
Potassium: 3.9 mmol/L (ref 3.5–5.2)
Sodium: 138 mmol/L (ref 134–144)
Total Protein: 6.3 g/dL (ref 6.0–8.5)
eGFR: 95 mL/min/{1.73_m2} (ref >59)

## 2022-01-01 NOTE — Progress Notes (Signed)
HI Verl.  Your lab work has improved from when you were in the hospital.  We will continue to monitor this.  Please let me know If you have any questions.

## 2022-01-02 ENCOUNTER — Emergency Department: Payer: Medicaid Other

## 2022-01-02 ENCOUNTER — Other Ambulatory Visit: Payer: Self-pay

## 2022-01-02 ENCOUNTER — Emergency Department
Admission: EM | Admit: 2022-01-02 | Discharge: 2022-01-02 | Disposition: A | Discharge status: Home / Self Care | Payer: Medicaid Other | Attending: Emergency Medicine | Admitting: Emergency Medicine

## 2022-01-02 ENCOUNTER — Telehealth: Payer: Self-pay

## 2022-01-02 DIAGNOSIS — R600 Localized edema: Secondary | ICD-10-CM | POA: Insufficient documentation

## 2022-01-02 DIAGNOSIS — R1084 Generalized abdominal pain: Secondary | ICD-10-CM

## 2022-01-02 DIAGNOSIS — K439 Ventral hernia without obstruction or gangrene: Secondary | ICD-10-CM | POA: Insufficient documentation

## 2022-01-02 DIAGNOSIS — R001 Bradycardia, unspecified: Secondary | ICD-10-CM | POA: Insufficient documentation

## 2022-01-02 LAB — CBC
HCT: 38.7 % — ABNORMAL LOW (ref 39.0–52.0)
Hemoglobin: 12.8 g/dL — ABNORMAL LOW (ref 13.0–17.0)
MCH: 31.4 pg (ref 26.0–34.0)
MCHC: 33.1 g/dL (ref 30.0–36.0)
MCV: 95.1 fL (ref 80.0–100.0)
Platelets: 166 10*3/uL (ref 150–400)
RBC: 4.07 MIL/uL — ABNORMAL LOW (ref 4.22–5.81)
RDW: 14.1 % (ref 11.5–15.5)
WBC: 6.2 10*3/uL (ref 4.0–10.5)
nRBC: 0 % (ref 0.0–0.2)

## 2022-01-02 LAB — CBC WITH DIFFERENTIAL/PLATELET
Abs Immature Granulocytes: 0.14 10*3/uL — ABNORMAL HIGH (ref 0.00–0.07)
Basophils Absolute: 0 10*3/uL (ref 0.0–0.1)
Basophils Relative: 0 %
Eosinophils Absolute: 0.1 10*3/uL (ref 0.0–0.5)
Eosinophils Relative: 2 %
HCT: 38.8 % — ABNORMAL LOW (ref 39.0–52.0)
Hemoglobin: 12.8 g/dL — ABNORMAL LOW (ref 13.0–17.0)
Immature Granulocytes: 2 %
Lymphocytes Relative: 19 %
Lymphs Abs: 1.2 10*3/uL (ref 0.7–4.0)
MCH: 31.6 pg (ref 26.0–34.0)
MCHC: 33 g/dL (ref 30.0–36.0)
MCV: 95.8 fL (ref 80.0–100.0)
Monocytes Absolute: 0.5 10*3/uL (ref 0.1–1.0)
Monocytes Relative: 8 %
Neutro Abs: 4.3 10*3/uL (ref 1.7–7.7)
Neutrophils Relative %: 69 %
Platelets: 168 10*3/uL (ref 150–400)
RBC: 4.05 MIL/uL — ABNORMAL LOW (ref 4.22–5.81)
RDW: 14 % (ref 11.5–15.5)
WBC: 6.3 10*3/uL (ref 4.0–10.5)
nRBC: 0 % (ref 0.0–0.2)

## 2022-01-02 LAB — URINALYSIS, ROUTINE W REFLEX MICROSCOPIC
Bacteria, UA: NONE SEEN
Bilirubin Urine: NEGATIVE
Glucose, UA: NEGATIVE mg/dL
Ketones, ur: NEGATIVE mg/dL
Leukocytes,Ua: NEGATIVE
Nitrite: NEGATIVE
Protein, ur: NEGATIVE mg/dL
Specific Gravity, Urine: 1.006 (ref 1.005–1.030)
pH: 7 (ref 5.0–8.0)

## 2022-01-02 LAB — COMPREHENSIVE METABOLIC PANEL
ALT: 24 U/L (ref 0–44)
AST: 23 U/L (ref 15–41)
Albumin: 4 g/dL (ref 3.5–5.0)
Alkaline Phosphatase: 53 U/L (ref 38–126)
Anion gap: 7 (ref 5–15)
BUN: 12 mg/dL (ref 6–20)
CO2: 26 mmol/L (ref 22–32)
Calcium: 9 mg/dL (ref 8.9–10.3)
Chloride: 104 mmol/L (ref 98–111)
Creatinine, Ser: 1.12 mg/dL (ref 0.61–1.24)
GFR, Estimated: >60 mL/min (ref >60)
Glucose, Bld: 115 mg/dL — ABNORMAL HIGH (ref 70–99)
Potassium: 4.7 mmol/L (ref 3.5–5.1)
Sodium: 137 mmol/L (ref 135–145)
Total Bilirubin: 0.8 mg/dL (ref 0.3–1.2)
Total Protein: 7.1 g/dL (ref 6.5–8.1)

## 2022-01-02 LAB — LIPASE, BLOOD: Lipase: 61 U/L — ABNORMAL HIGH (ref 11–51)

## 2022-01-02 LAB — TROPONIN I (HIGH SENSITIVITY)
Troponin I (High Sensitivity): 21 ng/L — ABNORMAL HIGH (ref <18)
Troponin I (High Sensitivity): 20 ng/L — ABNORMAL HIGH (ref <18)

## 2022-01-02 LAB — BRAIN NATRIURETIC PEPTIDE: B Natriuretic Peptide: 50.8 pg/mL (ref 0.0–100.0)

## 2022-01-02 MED ORDER — DIPHENHYDRAMINE HCL 50 MG/ML IJ SOLN
50.0000 mg | Freq: Once | INTRAMUSCULAR | Status: AC
  Filled 2022-01-02: qty 1

## 2022-01-02 MED ORDER — METHYLPREDNISOLONE SODIUM SUCC 40 MG IJ SOLR
40.0000 mg | Freq: Once | INTRAMUSCULAR | Status: AC
Start: 2022-01-02 — End: 2022-01-02
  Administered 2022-01-02: 40 mg via INTRAVENOUS
  Filled 2022-01-02: qty 1

## 2022-01-02 MED ORDER — DIPHENHYDRAMINE HCL 25 MG PO CAPS
50.0000 mg | ORAL_CAPSULE | Freq: Once | ORAL | Status: AC
  Administered 2022-01-02: 50 mg via ORAL
  Filled 2022-01-02: qty 2

## 2022-01-02 MED ORDER — IOHEXOL 300 MG/ML  SOLN
75.0000 mL | Freq: Once | INTRAMUSCULAR | Status: AC | PRN
  Administered 2022-01-02: 75 mL via INTRAVENOUS

## 2022-01-02 NOTE — ED Provider Notes (Signed)
[facility] Provider Note    Event Date/Time   First MD Initiated Contact with Patient 01/02/22 1037     (approximate)   History   Abdominal Pain   HPI  Ryan [name] is a 56 y.o. male comes in reporting he had a gallbladder drain put in 3 weeks ago.  The last several days he has been noticing bilateral leg swelling in his belly has been getting swollen.  Additionally his bellybutton is gotten bigger and is now tender.  He says he used to go to get his little finger tip into it but cannot anymore.      Physical Exam   Triage Vital Signs: ED Triage Vitals  Enc Vitals Group     BP 01/02/22 1026 114/62     Pulse Rate 01/02/22 1026 60     Resp 01/02/22 1026 14     Temp 01/02/22 1026 98.1 F (36.7 C)     Temp Source 01/02/22 1026 Oral     SpO2 01/02/22 1026 98 %     Weight --      Height 01/02/22 1011 5\' 7"  (1.702 m)     Head Circumference --      Peak Flow --      Pain Score 01/02/22 1011 6     Pain Loc --      Pain Edu? --      Excl. in GC? --     Most recent vital signs: Vitals:   01/02/22 1430 01/02/22 1500  BP: 108/65 103/65  Pulse: (!) 50 (!) 51  Resp: 20 18  Temp:    SpO2: 96% 96%     General: Awake, no distress.  CV:  Good peripheral perfusion.  Heart regular rate and rhythm no audible murmurs Resp:  Normal effort.  Lungs are clear Abd:  Distended diffusely tender worse in the right upper quadrant but not severely tender.  Drain is present nothing is coming out right this minute. Extremities: Bilateral edema    ED Results / Procedures / Treatments   Labs (all labs ordered are listed, but only abnormal results are displayed) Labs Reviewed  LIPASE, BLOOD - Abnormal; Notable for the following components:      Result Value   Lipase 61 (*)    All other components within normal limits  COMPREHENSIVE METABOLIC PANEL - Abnormal; Notable for the following components:   Glucose, Bld 115 (*)    All other components within normal  limits  CBC - Abnormal; Notable for the following components:   RBC 4.07 (*)    Hemoglobin 12.8 (*)    HCT 38.7 (*)    All other components within normal limits  URINALYSIS, ROUTINE W REFLEX MICROSCOPIC - Abnormal; Notable for the following components:   Color, Urine STRAW (*)    APPearance CLEAR (*)    Hgb urine dipstick MODERATE (*)    All other components within normal limits  CBC WITH DIFFERENTIAL/PLATELET - Abnormal; Notable for the following components:   RBC 4.05 (*)    Hemoglobin 12.8 (*)    HCT 38.8 (*)    Abs Immature Granulocytes 0.14 (*)    All other components within normal limits  TROPONIN I (HIGH SENSITIVITY) - Abnormal; Notable for the following components:   Troponin I (High Sensitivity) 21 (*)    All other components within normal limits  TROPONIN I (HIGH SENSITIVITY) - Abnormal; Notable for the following components:   Troponin I (High Sensitivity) 20 (*)  All other components within normal limits  BRAIN NATRIURETIC PEPTIDE     EKG  EKG read and interpreted by me shows sinus bradycardia rate of 50 normal axis apparent left bundle branch block very similar to EKG from 19 on 14 July of this year.   RADIOLOGY Chest x-ray read by radiology reviewed and interpreted by me shows no acute signs of CHF.  The radiologist feels there is chronic dilatation of the aorta. CT read by radiology reviewed by me does not show any acute pathology.   PROCEDURES: Critical Care performed:   Procedures   MEDICATIONS ORDERED IN ED: Medications  methylPREDNISolone sodium succinate (SOLU-MEDROL) 40 mg/mL injection 40 mg (40 mg Intravenous Given 01/02/22 1113)  diphenhydrAMINE (BENADRYL) capsule 50 mg (50 mg Oral Given 01/02/22 1405)    Or  diphenhydrAMINE (BENADRYL) injection 50 mg ( Intravenous See Alternative 01/02/22 1405)  iohexol (OMNIPAQUE) 300 MG/ML solution 75 mL (75 mLs Intravenous Contrast Given 01/02/22 1514)     IMPRESSION / MDM / ASSESSMENT AND PLAN / ED COURSE   I reviewed the triage vital signs and the nursing notes. The patient's lab work including troponin LFTs are stable.  Dr. Fanny Bien who saw the patient last week came by to see the patient to and does not think that there is anything particularly different going on.  I will let the patient go he will follow-up with his regular doctors and return for any further problems.  Differential diagnosis includes, but is not limited to, bile leak, ascites, heart trouble resulting in CHF which is new.  None of these appear to be true there is no sign of any CHF on the chest x-ray or CT.  There is no ascites there is no sign of bile leak lab work looks good he does not have a white count and he does not have any elevated liver functions.  He has been lipase is minimally elevated but he really does not have any tenderness there is more in the upper abdomen.  Additionally his CT does not show any inflammation.  Not sure exactly what is going on with the patient but it does not seem to be anything needs to be admitted for.  We will have the patient follow-up with his doctor return if he has any further problems with fever or increasing pain etc.  Patient's presentation is most consistent with acute complicated illness / injury requiring diagnostic workup.  The patient is on the cardiac monitor to evaluate for evidence of arrhythmia and/or significant heart rate changes.  None have been seen     FINAL CLINICAL IMPRESSION(S) / ED DIAGNOSES   Final diagnoses:  Generalized abdominal pain     Rx / DC Orders   ED Discharge Orders     None        Note:  This document was prepared using Dragon voice recognition software and may include unintentional dictation errors.   Arnaldo Natal, MD 01/02/22 [phone]

## 2022-01-02 NOTE — ED Triage Notes (Signed)
Pt c/o abd pain with swelling , swelling to the legs worse in the past 24 hrs, pt had a drain placed for his gall bladder on Monday.

## 2022-01-02 NOTE — Telephone Encounter (Signed)
Patient called to advise that he has a sore stomach that has doubled in size overnight and that he can no longer see his belly button. Spoke with [employer] regarding patients concerns and per Jolene advised patient to go to the hospital. Patient acknowledged understanding and agreed to go as advised.

## 2022-01-02 NOTE — ED Provider Notes (Signed)
Patient fully alert, resting comfortably without distress.  Abdomen soft nontender in all quadrants, not appreciably distended in any way.  No protruding umbilicus.  He does have a ventral hernia when he flexes forward, but this is quickly alleviated with rest and he reports this to be chronic  In no acute pain.  Biliary drain not draining light green fluid, no purulence noted.   Sharyn Creamer, MD 01/02/22 [phone]

## 2022-01-02 NOTE — ED Notes (Signed)
Pt unhooked to go to the bathroom. Pt ambulatory with steady gait

## 2022-01-02 NOTE — Discharge Instructions (Addendum)
Your CT and blood work looks okay.  There is no sign of fluid in the belly.  Your lab work does not show any problems with your gallbladder.  Please follow-up with your regular doctors.  Do not hesitate to return if you have any shortness of breath or more increasing swelling or fever or any other problems.

## 2022-01-05 ENCOUNTER — Emergency Department: Payer: Medicaid Other

## 2022-01-05 ENCOUNTER — Encounter: Payer: Self-pay | Admitting: *Deleted

## 2022-01-05 ENCOUNTER — Other Ambulatory Visit: Payer: Self-pay

## 2022-01-05 DIAGNOSIS — R778 Other specified abnormalities of plasma proteins: Secondary | ICD-10-CM | POA: Insufficient documentation

## 2022-01-05 DIAGNOSIS — D696 Thrombocytopenia, unspecified: Secondary | ICD-10-CM | POA: Insufficient documentation

## 2022-01-05 DIAGNOSIS — M25511 Pain in right shoulder: Secondary | ICD-10-CM | POA: Insufficient documentation

## 2022-01-05 DIAGNOSIS — I10 Essential (primary) hypertension: Secondary | ICD-10-CM | POA: Insufficient documentation

## 2022-01-05 DIAGNOSIS — R519 Headache, unspecified: Secondary | ICD-10-CM | POA: Insufficient documentation

## 2022-01-05 DIAGNOSIS — X500XXA Overexertion from strenuous movement or load, initial encounter: Secondary | ICD-10-CM | POA: Insufficient documentation

## 2022-01-05 DIAGNOSIS — R791 Abnormal coagulation profile: Secondary | ICD-10-CM | POA: Insufficient documentation

## 2022-01-05 LAB — CBC WITH DIFFERENTIAL/PLATELET
Abs Immature Granulocytes: 0.06 10*3/uL (ref 0.00–0.07)
Basophils Absolute: 0 10*3/uL (ref 0.0–0.1)
Basophils Relative: 0 %
Eosinophils Absolute: 0.1 10*3/uL (ref 0.0–0.5)
Eosinophils Relative: 1 %
HCT: 40.4 % (ref 39.0–52.0)
Hemoglobin: 13.4 g/dL (ref 13.0–17.0)
Immature Granulocytes: 1 %
Lymphocytes Relative: 14 %
Lymphs Abs: 1 10*3/uL (ref 0.7–4.0)
MCH: 31 pg (ref 26.0–34.0)
MCHC: 33.2 g/dL (ref 30.0–36.0)
MCV: 93.5 fL (ref 80.0–100.0)
Monocytes Absolute: 0.4 10*3/uL (ref 0.1–1.0)
Monocytes Relative: 5 %
Neutro Abs: 5.6 10*3/uL (ref 1.7–7.7)
Neutrophils Relative %: 79 %
Platelets: 149 10*3/uL — ABNORMAL LOW (ref 150–400)
RBC: 4.32 MIL/uL (ref 4.22–5.81)
RDW: 13.8 % (ref 11.5–15.5)
WBC: 7.1 10*3/uL (ref 4.0–10.5)
nRBC: 0 % (ref 0.0–0.2)

## 2022-01-05 LAB — PROTIME-INR
INR: 1.9 — ABNORMAL HIGH (ref 0.8–1.2)
Prothrombin Time: 21.3 seconds — ABNORMAL HIGH (ref 11.4–15.2)

## 2022-01-05 LAB — COMPREHENSIVE METABOLIC PANEL
ALT: 25 U/L (ref 0–44)
AST: 33 U/L (ref 15–41)
Albumin: 4.2 g/dL (ref 3.5–5.0)
Alkaline Phosphatase: 66 U/L (ref 38–126)
Anion gap: 7 (ref 5–15)
BUN: 14 mg/dL (ref 6–20)
CO2: 27 mmol/L (ref 22–32)
Calcium: 8.9 mg/dL (ref 8.9–10.3)
Chloride: 103 mmol/L (ref 98–111)
Creatinine, Ser: 1.16 mg/dL (ref 0.61–1.24)
GFR, Estimated: >60 mL/min (ref >60)
Glucose, Bld: 110 mg/dL — ABNORMAL HIGH (ref 70–99)
Potassium: 3.8 mmol/L (ref 3.5–5.1)
Sodium: 137 mmol/L (ref 135–145)
Total Bilirubin: 1.1 mg/dL (ref 0.3–1.2)
Total Protein: 7.6 g/dL (ref 6.5–8.1)

## 2022-01-05 LAB — URINALYSIS, ROUTINE W REFLEX MICROSCOPIC
Bacteria, UA: NONE SEEN
Bilirubin Urine: NEGATIVE
Glucose, UA: NEGATIVE mg/dL
Ketones, ur: NEGATIVE mg/dL
Leukocytes,Ua: NEGATIVE
Nitrite: NEGATIVE
Protein, ur: NEGATIVE mg/dL
Specific Gravity, Urine: 1.014 (ref 1.005–1.030)
pH: 5 (ref 5.0–8.0)

## 2022-01-05 LAB — LIPASE, BLOOD: Lipase: 38 U/L (ref 11–51)

## 2022-01-05 LAB — TROPONIN I (HIGH SENSITIVITY): Troponin I (High Sensitivity): 26 ng/L — ABNORMAL HIGH (ref <18)

## 2022-01-05 NOTE — Telephone Encounter (Signed)
Summary: Drainage tube from gallbladder   Patient called in regarding his gallbladder drainage tube. He states the fluid was initially brown, then changed to red and now it is yellow. He wants to know if this is norma? Please assist patient further.     Attempted to call pt to discuss color of drainage. Does pt have a Careers adviser or GI or needs PCP to answer.  Call back number provided.

## 2022-01-05 NOTE — ED Provider Triage Note (Signed)
Emergency Medicine Provider Triage Evaluation Note  Ryan [name] , a 56 y.o. male  was evaluated in triage.  Pt complains of right shoulder pain, difficulty speaking, unable to move right upper extremity.  Patient woke from sleep, had sharp right shoulder pain.  Patient states that his left side was working continuously, his right side was not.  He does have a history of stroke, MIs, has a gallbladder drain in place patient states that the symptoms have improved at this time.  Review of Systems  Positive: Right shoulder pain, right arm weakness Negative: Headache, chest pain, emesis urinary changes  Physical Exam  BP 110/73 (BP Location: Right Arm)   Pulse 64   Temp 98.3 F (36.8 C) (Oral)   Resp 19   Ht 5\' 7"  (1.702 m)   Wt 81.3 kg   SpO2 96%   BMI 28.07 kg/m  Gen:   Awake, no distress   Resp:  Normal effort  MSK:   Moves extremities without difficulty.  Good range of motion to the right upper extremity with no tenderness on palpation. Other:    Medical Decision Making  Medically screening exam initiated at 10:25 PM.  Appropriate orders placed.  was informed that the remainder of the evaluation will be completed by another provider, this initial triage assessment does not replace that evaluation, and the importance of remaining in the ED until their evaluation is complete.  Patient arrives with right shoulder pain, inability to use her right arm, communicate for roughly 5 minutes.  Complex medical history.  Patient will have labs, imaging, EKG at this time   Adelfa Koh, Racheal Patches 01/05/22 2225

## 2022-01-05 NOTE — ED Triage Notes (Signed)
C/o waking up with headache this morning, pain to drainage site.S/P nephrostomy tube placement on Friday. Was given Percocet for pain and then he went to sleep. States when he woke up, he couldn't move and it felt like his right shoulder was " going thru my neck."  PA in triage for MSE at this time

## 2022-01-06 ENCOUNTER — Emergency Department
Admission: EM | Admit: 2022-01-06 | Discharge: 2022-01-06 | Disposition: A | Discharge status: Home / Self Care | Payer: Medicaid Other | Attending: Emergency Medicine | Admitting: Emergency Medicine

## 2022-01-06 DIAGNOSIS — M25511 Pain in right shoulder: Secondary | ICD-10-CM

## 2022-01-06 LAB — TROPONIN I (HIGH SENSITIVITY): Troponin I (High Sensitivity): 27 ng/L — ABNORMAL HIGH (ref <18)

## 2022-01-06 NOTE — ED Provider Notes (Signed)
[facility] Provider Note    Event Date/Time   First MD Initiated Contact with Patient 01/06/22 0710     (approximate)   History   No chief complaint on file.   HPI  Ryan [name] is a 56 y.o. 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  Patient reports that he woke up yesterday at 9 AM with a headache on 01/05/2022.  He reports that this was his typical headache and that he took some Tylenol but this time the headache was improved but did not go away all the way.  He reports that during the day he did a lot of heavy working and he was using a drill with his right arm.  He reports that he took one of his pain medication and he went down for a nap and when he woke up at 8 PM he woke up with inability to move his body and inability to speak.  He reported that he was still able to get a phone and call EMS and that when they got there he was almost back to his baseline self.  This episode lasted 10 minutes.  With this episode he had some right shoulder pain that was pretty significant.  He reports that after being in the waiting room for over 10 hours he is now completely back to his baseline self he denies any chest pain, new abdominal pain, fevers, leg pain or any other concerns.      Physical Exam   Triage Vital Signs: ED Triage Vitals  Enc Vitals Group     BP 01/05/22 2217 110/73     Pulse Rate 01/05/22 2217 64     Resp 01/05/22 2217 19     Temp 01/05/22 2217 98.3 F (36.8 C)     Temp Source 01/05/22 2217 Oral     SpO2 01/05/22 2217 96 %     Weight 01/05/22 2218 179 lb 3.7  oz (81.3 kg)     Height 01/05/22 2218 5\' 7"  (1.702 m)     Head Circumference --      Peak Flow --      Pain Score 01/05/22 2218 5     Pain Loc --      Pain Edu? --      Excl. in GC? --     Most recent vital signs: Vitals:   01/05/22 2217 01/06/22 0342  BP: 110/73 111/69  Pulse: 64 (!) 57  Resp: 19 18  Temp: 98.3 F (36.8 C) 98.3 F (36.8 C)  SpO2: 96% 97%     General: Awake, no distress.  CV:  Good peripheral perfusion.  Resp:  Normal effort.  Abd:  No distention.  Patient has a gallbladder drain.  His abdomen is soft and nontender. Other:  Patient has good distal pulses.  Sensation intact throughout.  Equal strength throughout.  NIH stroke scale is 0.  Will get patient up and ambulate to ensure steady gait.   ED Results / Procedures / Treatments   Labs (all labs ordered are listed, but only abnormal results are displayed) Labs Reviewed  URINALYSIS, ROUTINE W REFLEX MICROSCOPIC - Abnormal; Notable  for the following components:      Result Value   Color, Urine YELLOW (*)    APPearance CLEAR (*)    Hgb urine dipstick MODERATE (*)    All other components within normal limits  COMPREHENSIVE METABOLIC PANEL - Abnormal; Notable for the following components:   Glucose, Bld 110 (*)    All other components within normal limits  CBC WITH DIFFERENTIAL/PLATELET - Abnormal; Notable for the following components:   Platelets 149 (*)    All other components within normal limits  PROTIME-INR - Abnormal; Notable for the following components:   Prothrombin Time 21.3 (*)    INR 1.9 (*)    All other components within normal limits  TROPONIN I (HIGH SENSITIVITY) - Abnormal; Notable for the following components:   Troponin I (High Sensitivity) 26 (*)    All other components within normal limits  TROPONIN I (HIGH SENSITIVITY) - Abnormal; Notable for the following components:   Troponin I (High Sensitivity) 27 (*)    All other components within normal limits  LIPASE, BLOOD      EKG  My interpretation of EKG:  Sinus bradycardia no st elevation, twi II, AVL V2-V6 with LBBB similar to prior EKG   RADIOLOGY I have reviewed the CT head personally and interpreted no evidence of intracranial hemorrhage  PROCEDURES:  Critical Care performed: No  Procedures   MEDICATIONS ORDERED IN ED: Medications - No data to display   IMPRESSION / MDM / ASSESSMENT AND PLAN / ED COURSE  I reviewed the triage vital signs and the nursing notes.   Patient's presentation is most consistent with acute presentation with potential threat to life or bodily function.   Differential includes intercranial hemorrhage, dissection, ACS, intracranial hemorrhage, stroke.  Patient's last known normal was 2 to 3 PM on 01/05/2022.  Patient had been in the waiting room for over 10 hours before being seen  Ct head and neck negative Chest xray negative Shoulder xray negative  CMP stable cr  Cbc slightly low plts  Trop slightly elevated 26-- 27 similar to prior  INR 1.9 Lipase normal  UA no UTI   Patient has been in the waiting room for over 10 hours given complete resolution of symptoms we discussed CT angio of the head and neck and chest but he has a contrast allergy.  Given he has complete resolution I do not feel that this represents an aneurysmal head bleed.  He had a CT head that was negative for intercranial hemorrhage and he is completely back to baseline.  No evidence of LVO.  For the shoulder pain he has had cardiac markers and troponins that are around his baseline so no evidence of active ACS.  I have very low suspicion for dissection again due to stable vital signs and no new chest pain, abdominal pain.  At this time patient is agreeable to holding off on CT scans given he has no recurrent symptoms and he can come back to the ER if symptoms are returning.  It would be a little bit odd to be a TIA with his whole body being involved.  I have very low suspicion of stroke given the  resolution of symptoms and do not feel that MRI would change anything.  We again discussed this and patient is okay with holding off on testing at this time given he is completely at his baseline self.  He is going to return home and take 2 days off of work and will return if  symptoms come back.  Patient is amblatory around the room tolerating PO without any recurrent symptoms       FINAL CLINICAL IMPRESSION(S) / ED DIAGNOSES   Final diagnoses:  Acute pain of right shoulder     Rx / DC Orders   ED Discharge Orders     None        Note:  This document was prepared using Dragon voice recognition software and may include unintentional dictation errors.   Concha Se, MD 01/06/22 [phone]

## 2022-01-06 NOTE — Discharge Instructions (Signed)
Your INR was slightly low you can follow-up with your warfarin clinic tomorrow as planned.  We discussed doing CT imagings or MRIs but given your symptoms have completely resolved we have opted to hold off however if you have return of symptoms and please return to the ER immediately for repeat evaluation

## 2022-01-06 NOTE — Telephone Encounter (Signed)
This encounter was created in error - please disregard.

## 2022-01-06 NOTE — ED Notes (Addendum)
Pt verbalized understanding of discharge instructions. Opportunity for questions provided.    Pt provided graham crackers per request and tolerated w/o issue.   Pt provided a paper scrub top.  Pt refused grippy sock and a wheelchair for transport.

## 2022-01-07 ENCOUNTER — Ambulatory Visit (INDEPENDENT_AMBULATORY_CARE_PROVIDER_SITE_OTHER): Payer: Medicaid Other

## 2022-01-07 DIAGNOSIS — Z952 Presence of prosthetic heart valve: Secondary | ICD-10-CM | POA: Diagnosis not present

## 2022-01-07 DIAGNOSIS — Z7901 Long term (current) use of anticoagulants: Secondary | ICD-10-CM

## 2022-01-07 DIAGNOSIS — Z5181 Encounter for therapeutic drug level monitoring: Secondary | ICD-10-CM | POA: Diagnosis not present

## 2022-01-07 LAB — POCT INR: INR: 1.7 — AB (ref 2.0–3.0)

## 2022-01-07 NOTE — Patient Instructions (Signed)
Take 2 tablets today, then INCREASE TO 1 tablet every day EXCEPT 0.5 tablet on Tuesday and Thursday. - Recheck INR in 2 weeks

## 2022-01-20 ENCOUNTER — Ambulatory Visit: Payer: Self-pay | Admitting: General Surgery

## 2022-01-20 NOTE — H&P (Signed)
PATIENT PROFILE: Ryan [name] is a 56 y.o. male who presents to the Clinic for consultation at the request of Ryan [name] for evaluation of cholelithiasis.  PCP:  Alcus Dad, NP  HISTORY OF PRESENT ILLNESS: Ryan [name] reports history of acute cholecystitis admitted to the hospital.  Due to being on anticoagulation and Coumadin and the sub- acuteness of the onset of cholecystitis patient was found to be high risk for cholecystectomy at the moment.  He had percutaneous drainage by interventional radiology.  He responded adequately.  He has been doing well.  He denies any abdominal pain.  He is tolerating diet.  He is taking great care of the drain.  Drain was placed on 12/26/2021   PROBLEM LIST: Chronic cholecystitis Chronic anticoagulation History of cardiac valve replacement Hypertension, hyperlipidemia  GENERAL REVIEW OF SYSTEMS:   General ROS: negative for - chills, fatigue, fever, weight gain or weight loss Allergy and Immunology ROS: negative for - hives  Hematological and Lymphatic ROS: negative for - bleeding problems or bruising, negative for palpable nodes Endocrine ROS: negative for - heat or cold intolerance, hair changes Respiratory ROS: negative for - cough, shortness of breath or wheezing Cardiovascular ROS: no chest pain or palpitations GI ROS: negative for nausea, vomiting, abdominal pain, diarrhea, constipation Musculoskeletal ROS: negative for - joint swelling or muscle pain Neurological ROS: negative for - confusion, syncope Dermatological ROS: negative for pruritus and rash Psychiatric: negative for anxiety, depression, difficulty sleeping and memory loss  MEDICATIONS: Current Outpatient Medications  Medication Sig Dispense Refill   aspirin 81 MG EC tablet Take 81 mg by mouth once daily     ferrous sulfate 325 (65 FE) MG tablet Take 650 mg by mouth at bedtime     lisinopriL (ZESTRIL) 5 MG tablet Take 5 mg by mouth once daily     lovastatin (MEVACOR) 20 MG  tablet Take 20 mg by mouth at bedtime     oxyCODONE-acetaminophen (PERCOCET) 5-325 mg tablet Take by mouth as needed     potassium gluconate 2.5 mEq Tab Take 2 tablets by mouth every evening     warfarin (COUMADIN) 5 MG tablet Take by mouth as directed     No current facility-administered medications for this visit.    ALLERGIES: Iodine  PAST MEDICAL HISTORY: Past Medical History:  Diagnosis Date   Cardiomyopathy (CMS-HCC)    Enlarged coronary artery (CMS-HCC)    HLD (hyperlipidemia)    HTN (hypertension)    Stroke (CMS-HCC) 2012   x2   TIA (transient ischemic attack)    x5    PAST SURGICAL HISTORY: Past Surgical History:  Procedure Laterality Date   REPLACEMENT AORTIC VALVE  2001   in [place] -- [facility]   ARTHROSCOPIC REPAIR ACL Bilateral    CATARACT EXTRACTION Left    OPEN MENISCAL TRANSPLANTATION       FAMILY HISTORY: Family History  Problem Relation Age of Onset   Lung cancer Mother    Myocardial Infarction (Heart attack) Mother    Heart disease Sister    COPD Sister    Heart failure Sister    Heart disease Sister    Heart disease Sister    Heart disease Sister    Heart disease Sister    Heart disease Sister    Heart disease Brother    Heart disease Brother    Heart disease Brother    Heart disease Brother    Heart disease Brother    Heart disease Brother  Diabetes Maternal Grandmother    Myocardial Infarction (Heart attack) Maternal Grandfather      SOCIAL HISTORY: Social History   Socioeconomic History   Marital status: Married  Tobacco Use   Smoking status: Never    Passive exposure: Never   Smokeless tobacco: Never  Vaping Use   Vaping Use: Never used  Substance and Sexual Activity   Alcohol use: Yes    Comment: occasional   Drug use: Never    PHYSICAL EXAM: Vitals:   01/20/22 1003  BP: 119/66  Pulse: 60   Body mass index is 27.41 kg/m. Weight: 79.4 kg (175 lb)   GENERAL: Alert, active, oriented x3  HEENT: Pupils  equal reactive to light. Extraocular movements are intact. Sclera clear. Palpebral conjunctiva normal red color.Pharynx clear.  NECK: Supple with no palpable mass and no adenopathy.  LUNGS: Sound clear with no rales rhonchi or wheezes.  HEART: Regular rhythm S1 and S2 without murmur.  ABDOMEN: Soft and depressible, nontender with no palpable mass, no hepatomegaly.  Right upper quadrant drain in place.  Adequate bilious output  EXTREMITIES: Well-developed well-nourished symmetrical with no dependent edema.  NEUROLOGICAL: Awake alert oriented, facial expression symmetrical, moving all extremities.  REVIEW OF DATA: I have reviewed the following data today: No visits with results within 3 Month(s) from this visit.  Latest known visit with results is:  No results found for any previous visit.     ASSESSMENT: [name] is a 56 y.o. male presenting for consultation for cholelithiasis with chronic cholecystitis.    Patient was oriented about the diagnosis of cholelithiasis with chronic cholecystitis with cholecystostomy drain in place. Also oriented about what is the gallbladder, its anatomy and function and the implications of having stones.    The patient has been responding well to cholecystostomy drain.  He does not have any right upper quadrant pain.  Bilious output is adequate.  The patient was oriented about the treatment alternatives (observation vs cholecystectomy). Patient was oriented that a low percentage of patient will continue to have similar pain symptoms even after the gallbladder is removed. Surgical technique (open vs laparoscopic) was discussed. It was also discussed the goals of the surgery (decrease the pain episodes and avoid the risk of cholecystitis) and the risk of surgery including: bleeding, infection, common bile duct injury, stone retention, injury to other organs such as bowel, liver, stomach, other complications such as hernia, bowel obstruction among others. Also  discussed with patient about anesthesia and its complications such as: reaction to medications, pneumonia, heart complications, death, among others.  Patient was oriented has these chronic cholecystitis with drain in place are usually more difficult due to the amount of inflammation.  Working away at least 6 weeks from percutaneous drainage placement.  He also needs anticoagulation discontinuation of bridging due to high rates of thrombus formation in the cardiac valve.  We will bridge cardiology for preoperative management of anticoagulation.  CCC (chronic calculous cholecystitis) [K80.10]  PLAN: 1.  Robotic assisted laparoscopic cholecystectomy with possible cholangiogram(47563) 2.  CBC, CMP 3.  Cardiac clearance for anticoagulation management (Dr. Okey Dupre) 4.  Do not take aspirin 5 days before the procedure 5.  Contact us if has any question or concern.   Patient verbalized understanding, all questions were answered, and were agreeable with the plan outlined above.

## 2022-01-21 ENCOUNTER — Ambulatory Visit (INDEPENDENT_AMBULATORY_CARE_PROVIDER_SITE_OTHER): Payer: Medicaid Other

## 2022-01-21 DIAGNOSIS — Z7901 Long term (current) use of anticoagulants: Secondary | ICD-10-CM | POA: Diagnosis not present

## 2022-01-21 DIAGNOSIS — Z952 Presence of prosthetic heart valve: Secondary | ICD-10-CM

## 2022-01-21 DIAGNOSIS — Z5181 Encounter for therapeutic drug level monitoring: Secondary | ICD-10-CM | POA: Diagnosis not present

## 2022-01-21 LAB — POCT INR: INR: 5.3 — AB (ref 2.0–3.0)

## 2022-01-21 NOTE — Patient Instructions (Signed)
HOLD TODAY AND THURSDAY, then DECREASE TO 1 tablet every day EXCEPT 0.5 tablet on Monday, Wednesday and Friday - Recheck INR in 1 week

## 2022-01-28 ENCOUNTER — Ambulatory Visit (INDEPENDENT_AMBULATORY_CARE_PROVIDER_SITE_OTHER): Payer: Medicaid Other

## 2022-01-28 DIAGNOSIS — Z952 Presence of prosthetic heart valve: Secondary | ICD-10-CM

## 2022-01-28 DIAGNOSIS — Z7901 Long term (current) use of anticoagulants: Secondary | ICD-10-CM | POA: Diagnosis not present

## 2022-01-28 DIAGNOSIS — Z5181 Encounter for therapeutic drug level monitoring: Secondary | ICD-10-CM

## 2022-01-28 LAB — POCT INR: INR: 3.6 — AB (ref 2.0–3.0)

## 2022-01-28 NOTE — Patient Instructions (Signed)
HOLD TODAY AND THURSDAY, then CONTINUE 1 tablet every day EXCEPT 0.5 tablet on Monday, Wednesday and Friday - Recheck INR in 1 week

## 2022-01-29 ENCOUNTER — Ambulatory Visit: Payer: Self-pay | Admitting: *Deleted

## 2022-01-29 ENCOUNTER — Telehealth: Payer: Self-pay | Admitting: Internal Medicine

## 2022-01-29 NOTE — Telephone Encounter (Signed)
Reason for Disposition  [1] MILD-MODERATE post-op pain (e.g., pain scale 1-7) AND [2] not controlled with pain medications    Drain site looking and smelling infected  Has a drain in his gallbladder.  Answer Assessment - Initial Assessment Questions 1. SYMPTOM: "What's the main symptom you're concerned about?" (e.g., pain, fever, vomiting)     I have a drain in for my gallbladder.   It smells infected.   When I drain the bag it smells bad.  The entrance is red where the tube goes in.   I'm getting sharp pains too.   He has not called the surgeon that put it in.   Surgery for Aug. 30th.       2. ONSET: "When did smell  start?"     About a week now   It's really bad now 3. SURGERY: "What surgery did you have?"     When was the drain inserted July 27   It needs to be in 6 weeks before the surgery scheduled for Aug. 30th. 4. DATE of SURGERY: "When was the surgery?"      Drain inserted July 27th, 2023 5. ANESTHESIA: " What type of anesthesia did you have?" (e.g., general, spinal, epidural, local)     N/A 6. PAIN: "Is there any pain?" If Yes, ask: "How bad is it?"  (Scale 1-10; or mild, moderate, severe)     Yes sharp pains and redness at the site of the drain 7. FEVER: "Do you have a fever?" If Yes, ask: "What is your temperature, how was it measured, and when did it start?"     Not asked 8. VOMITING: "Is there any vomiting?" If Yes, ask: "How many times?"     Not asked 9. BLEEDING: "Is there any bleeding?" If Yes, ask: "How much?" and "Where?"     No bleeding 10. OTHER SYMPTOMS: "Do you have any other symptoms?" (e.g., drainage from wound, painful urination, constipation)       No  Protocols used: Post-Op Symptoms and Questions-A-AH

## 2022-01-29 NOTE — Telephone Encounter (Signed)
Patient has been put on antibiotics for infection in his gallbladder. He wants to know if he needs to change the dosage of his coumadin.

## 2022-01-29 NOTE — Telephone Encounter (Signed)
  Chief Complaint: drain going into gallbladder smells and looks infected. Symptoms: Contents from bag smell infected and the site where the drain is at is red and tender Frequency: For a week Pertinent Negatives: Patient denies  Disposition: [] ED /[] Urgent Care (no appt availability in office) / [] Appointment(In office/virtual)/ []  [facility]/ [] Home Care/ [] Refused Recommended Disposition /[] [facility]/ [x]  Follow-up with PCP Additional Notes: Instructed to call his surgeon.   Pt agreeable to doing this

## 2022-01-29 NOTE — Telephone Encounter (Signed)
Called and spoke to pt who stated that he was started on Amoxicillan Clav, 500/125 mg, 1 tablet every 12 hours. Pt stated that it is a 10 day course and that he is going to start taking it today. Informed pt that it should not interfere with his Warfarin and to keep appointment for 8/23.   However, pt stated that  he has taken it in the past  and it can drop his INR. Offered pt to come on 8/21 or 8/22 to [facility] to have INR checked. Pt stated that he would keep appointment on 8/23 in [place].   Instructed pt to continue to follow warfarin instructions he was given yesterday and to come to appointment on 02/04/2022.

## 2022-01-31 ENCOUNTER — Inpatient Hospital Stay
Admission: EM | Admit: 2022-01-31 | Discharge: 2022-02-03 | DRG: 419 | Disposition: A | Discharge status: Home / Self Care | Payer: Medicaid Other | Attending: Surgery | Admitting: Surgery

## 2022-01-31 ENCOUNTER — Emergency Department: Payer: Medicaid Other

## 2022-01-31 ENCOUNTER — Other Ambulatory Visit: Payer: Self-pay

## 2022-01-31 DIAGNOSIS — K760 Fatty (change of) liver, not elsewhere classified: Secondary | ICD-10-CM | POA: Diagnosis present

## 2022-01-31 DIAGNOSIS — Z952 Presence of prosthetic heart valve: Secondary | ICD-10-CM

## 2022-01-31 DIAGNOSIS — I10 Essential (primary) hypertension: Secondary | ICD-10-CM | POA: Diagnosis present

## 2022-01-31 DIAGNOSIS — I2510 Atherosclerotic heart disease of native coronary artery without angina pectoris: Secondary | ICD-10-CM | POA: Diagnosis present

## 2022-01-31 DIAGNOSIS — I480 Paroxysmal atrial fibrillation: Secondary | ICD-10-CM | POA: Diagnosis present

## 2022-01-31 DIAGNOSIS — E785 Hyperlipidemia, unspecified: Secondary | ICD-10-CM | POA: Diagnosis present

## 2022-01-31 DIAGNOSIS — K219 Gastro-esophageal reflux disease without esophagitis: Secondary | ICD-10-CM | POA: Diagnosis present

## 2022-01-31 DIAGNOSIS — Z7901 Long term (current) use of anticoagulants: Secondary | ICD-10-CM

## 2022-01-31 DIAGNOSIS — K811 Chronic cholecystitis: Principal | ICD-10-CM | POA: Diagnosis present

## 2022-01-31 DIAGNOSIS — Z7982 Long term (current) use of aspirin: Secondary | ICD-10-CM

## 2022-01-31 DIAGNOSIS — Z8249 Family history of ischemic heart disease and other diseases of the circulatory system: Secondary | ICD-10-CM

## 2022-01-31 DIAGNOSIS — K660 Peritoneal adhesions (postprocedural) (postinfection): Secondary | ICD-10-CM | POA: Diagnosis present

## 2022-01-31 DIAGNOSIS — Z8673 Personal history of transient ischemic attack (TIA), and cerebral infarction without residual deficits: Secondary | ICD-10-CM

## 2022-01-31 DIAGNOSIS — Z79899 Other long term (current) drug therapy: Secondary | ICD-10-CM

## 2022-01-31 HISTORY — DX: Chronic cholecystitis: K81.1

## 2022-01-31 LAB — CBC WITH DIFFERENTIAL/PLATELET
Abs Immature Granulocytes: 0.05 10*3/uL (ref 0.00–0.07)
Basophils Absolute: 0 10*3/uL (ref 0.0–0.1)
Basophils Relative: 0 %
Eosinophils Absolute: 0.1 10*3/uL (ref 0.0–0.5)
Eosinophils Relative: 1 %
HCT: 36 % — ABNORMAL LOW (ref 39.0–52.0)
Hemoglobin: 11.7 g/dL — ABNORMAL LOW (ref 13.0–17.0)
Immature Granulocytes: 1 %
Lymphocytes Relative: 15 %
Lymphs Abs: 0.9 10*3/uL (ref 0.7–4.0)
MCH: 30 pg (ref 26.0–34.0)
MCHC: 32.5 g/dL (ref 30.0–36.0)
MCV: 92.3 fL (ref 80.0–100.0)
Monocytes Absolute: 0.4 10*3/uL (ref 0.1–1.0)
Monocytes Relative: 6 %
Neutro Abs: 4.7 10*3/uL (ref 1.7–7.7)
Neutrophils Relative %: 77 %
Platelets: 143 10*3/uL — ABNORMAL LOW (ref 150–400)
RBC: 3.9 MIL/uL — ABNORMAL LOW (ref 4.22–5.81)
RDW: 14.1 % (ref 11.5–15.5)
WBC: 6.1 10*3/uL (ref 4.0–10.5)
nRBC: 0 % (ref 0.0–0.2)

## 2022-01-31 LAB — URINALYSIS, ROUTINE W REFLEX MICROSCOPIC
Bacteria, UA: NONE SEEN
Bilirubin Urine: NEGATIVE
Glucose, UA: NEGATIVE mg/dL
Ketones, ur: NEGATIVE mg/dL
Leukocytes,Ua: NEGATIVE
Nitrite: NEGATIVE
Protein, ur: NEGATIVE mg/dL
Specific Gravity, Urine: 1.012 (ref 1.005–1.030)
pH: 7 (ref 5.0–8.0)

## 2022-01-31 LAB — COMPREHENSIVE METABOLIC PANEL
ALT: 13 U/L (ref 0–44)
AST: 19 U/L (ref 15–41)
Albumin: 3.6 g/dL (ref 3.5–5.0)
Alkaline Phosphatase: 53 U/L (ref 38–126)
Anion gap: 5 (ref 5–15)
BUN: 9 mg/dL (ref 6–20)
CO2: 25 mmol/L (ref 22–32)
Calcium: 8.3 mg/dL — ABNORMAL LOW (ref 8.9–10.3)
Chloride: 107 mmol/L (ref 98–111)
Creatinine, Ser: 1.03 mg/dL (ref 0.61–1.24)
GFR, Estimated: >60 mL/min (ref >60)
Glucose, Bld: 105 mg/dL — ABNORMAL HIGH (ref 70–99)
Potassium: 4.1 mmol/L (ref 3.5–5.1)
Sodium: 137 mmol/L (ref 135–145)
Total Bilirubin: 0.9 mg/dL (ref 0.3–1.2)
Total Protein: 6.7 g/dL (ref 6.5–8.1)

## 2022-01-31 LAB — LACTIC ACID, PLASMA
Lactic Acid, Venous: 0.8 mmol/L (ref 0.5–1.9)
Lactic Acid, Venous: 0.4 mmol/L — ABNORMAL LOW (ref 0.5–1.9)

## 2022-01-31 LAB — PROTIME-INR
INR: 1.7 — ABNORMAL HIGH (ref 0.8–1.2)
Prothrombin Time: 19.5 seconds — ABNORMAL HIGH (ref 11.4–15.2)

## 2022-01-31 LAB — LIPASE, BLOOD: Lipase: 28 U/L (ref 11–51)

## 2022-01-31 MED ORDER — SODIUM CHLORIDE 0.9 % IV BOLUS
1000.0000 mL | Freq: Once | INTRAVENOUS | Status: AC
  Administered 2022-01-31: 1000 mL via INTRAVENOUS

## 2022-01-31 MED ORDER — ONDANSETRON 4 MG PO TBDP
4.0000 mg | ORAL_TABLET | Freq: Four times a day (QID) | ORAL | Status: DC | PRN
  Administered 2022-02-01: 4 mg via ORAL
  Filled 2022-01-31: qty 1

## 2022-01-31 MED ORDER — HYDROCODONE-ACETAMINOPHEN 5-325 MG PO TABS
1.0000 | ORAL_TABLET | ORAL | Status: DC | PRN
  Administered 2022-01-31: 2 via ORAL
  Administered 2022-02-01: 1 via ORAL
  Administered 2022-02-02 – 2022-02-03 (×2): 2 via ORAL
  Filled 2022-01-31 (×2): qty 2
  Filled 2022-01-31: qty 1
  Filled 2022-01-31: qty 2

## 2022-01-31 MED ORDER — SODIUM CHLORIDE 0.9 % IV SOLN
2.0000 g | INTRAVENOUS | Status: DC
  Administered 2022-01-31 – 2022-02-01 (×2): 2 g via INTRAVENOUS
  Filled 2022-01-31 (×2): qty 20

## 2022-01-31 MED ORDER — ONDANSETRON HCL 4 MG/2ML IJ SOLN: 4.0000 mg | Freq: Four times a day (QID) | INTRAMUSCULAR | Status: DC | PRN

## 2022-01-31 MED ORDER — MORPHINE SULFATE (PF) 4 MG/ML IV SOLN
4.0000 mg | Freq: Once | INTRAVENOUS | Status: AC
  Administered 2022-01-31: 4 mg via INTRAVENOUS
  Filled 2022-01-31: qty 1

## 2022-01-31 MED ORDER — MORPHINE SULFATE (PF) 2 MG/ML IV SOLN
2.0000 mg | INTRAVENOUS | Status: DC | PRN
  Administered 2022-01-31: 2 mg via INTRAVENOUS
  Filled 2022-01-31: qty 1

## 2022-01-31 MED ORDER — DOCUSATE SODIUM 100 MG PO CAPS: 100.0000 mg | ORAL_CAPSULE | Freq: Two times a day (BID) | ORAL | Status: DC | PRN

## 2022-01-31 MED ORDER — INDOCYANINE GREEN 25 MG IV SOLR
1.2500 mg | Freq: Once | INTRAVENOUS | Status: AC
  Filled 2022-01-31: qty 10

## 2022-01-31 MED ORDER — ONDANSETRON HCL 4 MG/2ML IJ SOLN
4.0000 mg | Freq: Once | INTRAMUSCULAR | Status: AC
  Administered 2022-01-31: 4 mg via INTRAVENOUS
  Filled 2022-01-31: qty 2

## 2022-01-31 MED ORDER — TRAMADOL HCL 50 MG PO TABS: 50.0000 mg | ORAL_TABLET | Freq: Four times a day (QID) | ORAL | Status: DC | PRN

## 2022-01-31 MED ORDER — LISINOPRIL 10 MG PO TABS
5.0000 mg | ORAL_TABLET | Freq: Every day | ORAL | Status: DC
  Administered 2022-01-31 – 2022-02-03 (×3): 5 mg via ORAL
  Filled 2022-01-31 (×3): qty 1

## 2022-01-31 MED ORDER — SODIUM CHLORIDE 0.9 % IV SOLN
INTRAVENOUS | Status: DC
Start: 2022-01-31 — End: 2022-02-02

## 2022-01-31 MED ORDER — PANTOPRAZOLE SODIUM 40 MG IV SOLR
40.0000 mg | Freq: Every day | INTRAVENOUS | Status: DC
  Administered 2022-01-31 – 2022-02-02 (×3): 40 mg via INTRAVENOUS
  Filled 2022-01-31 (×3): qty 10

## 2022-01-31 MED ORDER — PRAVASTATIN SODIUM 20 MG PO TABS
20.0000 mg | ORAL_TABLET | Freq: Every day | ORAL | Status: DC
  Administered 2022-01-31 – 2022-02-02 (×3): 20 mg via ORAL
  Filled 2022-01-31 (×3): qty 1

## 2022-01-31 NOTE — H&P (Signed)
Subjective:   CC: Chronic cholecystitis  HPI:  Ryan [name] is a 56 y.o. male who is consulted by [facility] for evaluation of above cc.  Symptoms were first noted several weeks ago. Pain is sharp.  Associated with nothing specific, exacerbated by nothing specific.      Past Medical History:  has a past medical history of Anemia, Aortic aneurysm and dissection (HCC), Chronic anticoagulation, Dissecting aortic aneurysm, thoracoabdominal (HCC), Fatty liver, GERD (gastroesophageal reflux disease), H/O mechanical aortic valve replacement, Heart murmur, History of cardiac catheterization, Hyperlipidemia, Hypertension, Hypolipidemia, Iron deficiency, PAF (paroxysmal atrial fibrillation) (HCC), Pulmonary nodule, Sleep apnea, and Stroke (HCC).  Past Surgical History:  has a past surgical history that includes Meniscus repair; Arthroscopic repair ACL; Aortic valve replacement (2001); Colonoscopy (2019); Abdominal surgery; and IR Perc Cholecystostomy (12/26/2021).  Family History: family history includes Heart attack in his maternal grandfather and mother; Heart disease in his mother; Hyperlipidemia in his mother; Hypertension in his mother.  Social History:  reports that he has never smoked. He has been exposed to tobacco smoke. He has never used smokeless tobacco. He reports current alcohol use. He reports that he does not use drugs.  Current Medications:  Prior to Admission medications   Medication Sig Start Date End Date Taking? Authorizing Provider  aspirin 81 MG EC tablet Take 81 mg by mouth daily.    [provider]  Ferrous Sulfate Dried (HIGH POTENCY IRON) 65 MG TABS Take 2 tablets by mouth daily. 08/13/21   [provider]  lisinopril (ZESTRIL) 5 MG tablet Take 1 tablet (5 mg total) by mouth daily. 12/31/21   End, Cristal Deer, MD  lovastatin (MEVACOR) 20 MG tablet Take 1 tablet (20 mg total) by mouth at bedtime. 10/20/21   Larae Grooms, NP  oxyCODONE-acetaminophen (PERCOCET/ROXICET)  5-325 MG tablet Take 1 tablet by mouth every 4 (four) hours as needed for severe pain. 12/29/21   Pennie Banter, DO  Potassium 99 MG TABS Take 2 tablets by mouth every evening.    [provider]  warfarin (COUMADIN) 5 MG tablet Take 0.5-1 tablets (2.5-5 mg total) by mouth daily at 4 PM. Take 5 mg Monday, Weds, Sat. 2.5 mg on all other days 12/10/21   End, Cristal Deer, MD    Allergies:  Allergies as of 01/31/2022 - Review Complete 01/31/2022  Allergen Reaction Noted   Alitraq Rash 08/14/2021   Iodinated contrast media Rash 02/04/2020   Iodine Rash 08/13/2018    ROS:  General: Denies weight loss, weight gain, fatigue, fevers, chills, and night sweats. Eyes: Denies blurry vision, double vision, eye pain, itchy eyes, and tearing. Ears: Denies hearing loss, earache, and ringing in ears. Nose: Denies sinus pain, congestion, infections, runny nose, and nosebleeds. Mouth/throat: Denies hoarseness, sore throat, bleeding gums, and difficulty swallowing. Heart: Denies chest pain, palpitations, racing heart, irregular heartbeat, leg pain or swelling, and decreased activity tolerance. Respiratory: Denies breathing difficulty, shortness of breath, wheezing, cough, and sputum. GI: Denies change in appetite, heartburn, nausea, vomiting, constipation, diarrhea, and blood in stool. GU: Denies difficulty urinating, pain with urinating, urgency, frequency, blood in urine. Musculoskeletal: Denies joint stiffness, pain, swelling, muscle weakness. Skin: Denies rash, itching, mass, tumors, sores, and boils Neurologic: Denies headache, fainting, dizziness, seizures, numbness, and tingling. Psychiatric: Denies depression, anxiety, difficulty sleeping, and memory loss. Endocrine: Denies heat or cold intolerance, and increased thirst or urination. Blood/lymph: Denies easy bruising, and swollen glands     Objective:     BP 122/66   Pulse 66  Temp 98.4 F (36.9 C) (Oral)   Resp 16   Ht 5\' 7"   (1.702 m)   Wt 81.3 kg   SpO2 97%   BMI 28.07 kg/m    Constitutional :  alert, cooperative, appears stated age, and no distress  Lymphatics/Throat:  no asymmetry, masses, or scars  Respiratory:  clear to auscultation bilaterally  Cardiovascular:  regular rate and rhythm  Gastrointestinal: Soft, focal TTP RUQ .   Musculoskeletal: Steady movement  Skin: Cool and moist, no surgical scars  Psychiatric: Normal affect, non-agitated, not confused       LABS:     Latest Ref Rng & Units 01/31/2022    1:18 PM 01/05/2022   10:21 PM 01/02/2022   10:32 AM  CMP  Glucose 70 - 99 mg/dL 01/04/2022  161  096   BUN 6 - 20 mg/dL 9  14  12    Creatinine 0.61 - 1.24 mg/dL 045   4.09   Sodium 135 - 145 mmol/L 137  137  137   Potassium 3.5 - 5.1 mmol/L 4.1  3.8  4.7   Chloride 98 - 111 mmol/L 107  103  104   CO2 22 - 32 mmol/L 25  27  26    Calcium 8.9 - 10.3 mg/dL 8.3  8.9  9.0   Total Protein 6.5 - 8.1 g/dL 6.7  7.6  7.1   Total Bilirubin 0.3 - 1.2 mg/dL 0.9  1.1  0.8   Alkaline Phos 38 - 126 U/L 53  66  53   AST 15 - 41 U/L 19  33  23   ALT 0 - 44 U/L 13  25  24        Latest Ref Rng & Units 01/31/2022    1:18 PM 01/05/2022   10:21 PM 01/02/2022   10:32 AM  CBC  WBC 4.0 - 10.5 K/uL 6.1  7.1  6.2    6.3   Hemoglobin 13.0 - 17.0 g/dL  02/02/2022  01/07/2022    01/04/2022   Hematocrit 39.0 - 52.0 % 36.0  40.4  38.7    38.8   Platelets 150 - 400 K/uL 143  149  166    168      RADS: N/a Assessment:      Chronic cholecystitsi  Plan:      Discussed the risk of surgery including post-op infxn, seroma, biloma, chronic pain, poor-delayed wound healing, retained gallstone, conversion to open procedure, post-op SBO or ileus, and need for additional procedures to address said risks.  The risks of general anesthetic including MI, CVA, sudden death or even reaction to anesthetic medications also discussed. Alternatives include continued observation.  Benefits include possible symptom relief, prevention of  complications including acute cholecystitis, pancreatitis.  Typical post operative recovery of 3-5 days rest, continued pain in area and incision sites, possible loose stools up to 4-6 weeks, also discussed.  The patient understands the risks, any and all questions were answered to the patient's satisfaction.  Will proceed with surgery tomorrow, understanding increased bleeding risk due to aspirin use, and slightly high INR.  Pt states he can not longer continue with the tube in place and the pain he is constantly in even with the use of percocet.  labs/images/medications/previous chart entries reviewed personally and relevant changes/updates noted above.

## 2022-01-31 NOTE — Progress Notes (Signed)
Patient requested code status to be DNR/DNI. MD notified.  Lidia Collum, RN

## 2022-01-31 NOTE — Plan of Care (Signed)
Pt AAOx4, moderate abdominal pain in RLQ. Currently NPO. VS are WNL. Plan for surgery in the morning. Bed in lowest position, call light within reach. Will continue to monitor.

## 2022-01-31 NOTE — ED Triage Notes (Signed)
Pt presented via EMS. Per pt he is having abdominal pain, swelling and SOB that starting last night. Pt denies N/V/D. Pt A&O x4. Pt takes tylenol for pain but states not working.

## 2022-01-31 NOTE — Anesthesia Preprocedure Evaluation (Addendum)
Anesthesia Evaluation  Patient identified by MRN, date of birth, ID band Patient awake    Reviewed: Allergy & Precautions, NPO status , Patient's Chart, lab work & pertinent test results  Airway Mallampati: III  TM Distance: >3 FB Neck ROM: full    Dental no notable dental hx.    Pulmonary sleep apnea ,    Pulmonary exam normal        Cardiovascular hypertension, Pt. 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  LAD normal.  LCx normal.  Dominant RCA normal.  - CTA chest (09/28/2021): Complex aortic dissection with aortic valve replacement.  Dilated aortic root measuring up to 5.1 cm, aortic arch measuring up to 4.6 cm and descending aorta measuring up to 5.4 cm.  Dissections extend into all great vessels except for the left common carotid artery.  Duplicated SVC noted. - TTE (08/27/2021): Normal LV size with mild LVH.  LVEF 50-55% with grade 2 diastolic dysfunction.  Mildly dilated RV with normal contraction.  Mild right atrial enlargement.  Mild mitral regurgitation.  Mechanical aortic valve present with mean gradient 7 mmHg.  Normal CVP   Neuro/Psych TIA (possible TIA on review of ED visit notes 12/2021)CVA negative psych ROS   GI/Hepatic GERD  Medicated,Fatty liver Acute Cholecystatis s/p IR PERC CHOLECYSTOSTOMY. Pt with increasingly severe pain along with subjective chills though no fevers.   Endo/Other  negative endocrine ROS  Renal/GU Renal InsufficiencyRenal disease     Musculoskeletal   Abdominal Normal abdominal exam  (+)   Peds  Hematology  (+) Blood dyscrasia, anemia ,  thrombocytopenia   Anesthesia Other Findings Per cardiology note 12/2021: From a heart standpoint, I think it would be reasonable for Ryan [name] to proceed with cholecystectomy when it is felt appropriate by his surgical team.  We would need to coordinate temporary discontinuation of anticoagulation +/- bridging with our pharmacy team.   Past Medical History: No date: Anemia No date: Aortic aneurysm and dissection (HCC)     Comment:  a. 2001 s/p grafting and AVR; b. 09/2021 CTA Chest: Dil               Ao root up to 30mm. Ao arch 48mm. Desc Ao 40mm.               Complicated arch dissection w/ mult false and/or               partially thrombosed lumens. Dissection flaps extend into              all great vessels except L carotid. Dissection cont into               R CCA and into abd->L RA. No date: Chronic anticoagulation No date: Dissecting aortic aneurysm, thoracoabdominal (HCC) No date: Fatty liver No date: GERD (gastroesophageal reflux disease) No date: H/O mechanical aortic valve replacement     Comment:  a. 2001 s/p AVR in setting of Asc Ao aneurysm repair; b.              08/2021 Echo: EF 50-55%, no rwma, mild LVH, GrII DD, nl RV              fxn,  mild MR, triv AI w/ mean AoV grad . No date: Heart murmur No date: History of cardiac catheterization     Comment:  a. 08/2019 MV: basal-dist lateral and inflat defect; b.               07/2020 Cath: nl cors. No date: Hyperlipidemia No date: Hypertension No date: Hypolipidemia No date: Iron deficiency No date: PAF (paroxysmal atrial fibrillation) (HCC)     Comment:  a. CHA2DS2VASc = 3.  Chronic warfarin (also mech AVR). No date: Pulmonary nodule No date: Sleep apnea No date: Stroke [facility])  Past Surgical History: No date: ABDOMINAL SURGERY 2001: AORTIC VALVE REPLACEMENT     Comment:  [employer] Mechanical No date: ARTHROSCOPIC REPAIR ACL 2019: COLONOSCOPY 12/26/2021: IR PERC CHOLECYSTOSTOMY No date: MENISCUS REPAIR  BMI    Body  Mass Index: 28.07 kg/m      Reproductive/Obstetrics negative OB ROS                            Anesthesia Physical Anesthesia Plan  ASA: 2  Anesthesia Plan: General ETT   Post-op Pain Management: Ofirmev IV (intra-op)*, Toradol IV (intra-op)* and Regional block*   Induction: Intravenous  PONV Risk Score and Plan: 3 and Ondansetron, Dexamethasone and Midazolam  Airway Management Planned: Oral ETT  Additional Equipment: Arterial line  Intra-op Plan:   Post-operative Plan:   Informed Consent:   Patient has DNR.   Dental Advisory Given  Plan Discussed with: Anesthesiologist, CRNA and Surgeon  Anesthesia Plan Comments:        Anesthesia Quick Evaluation

## 2022-01-31 NOTE — ED Provider Notes (Signed)
[facility] Provider Note    Event Date/Time   First MD Initiated Contact with Patient 01/31/22 1224     (approximate)   History   Abdominal Pain   HPI  Ryan [name] is a 56 y.o. male  here with RUQ abd pain. Pt has a h/o recent ruptured cholecystitis, has drain in place. Has had increasing pain, drainage, and foul odor from it for past week. He has been on augmentin as an outpt. Has had increasingly severe pain along with subjective chills though no fevers. Denies any drainage around the tube beyond the usual. No vomitnig. No diarrhea.       Physical Exam   Triage Vital Signs: ED Triage Vitals  Enc Vitals Group     BP 01/31/22 1238 106/67     Pulse Rate 01/31/22 1238 72     Resp 01/31/22 1238 16     Temp --      Temp src --      SpO2 01/31/22 1238 97 %     Weight 01/31/22 1239 179 lb 3.7 oz (81.3 kg)     Height 01/31/22 1239 5\' 7"  (1.702 m)     Head Circumference --      Peak Flow --      Pain Score 01/31/22 1239 8     Pain Loc --      Pain Edu? --      Excl. in GC? --     Most recent vital signs: Vitals:   01/31/22 1300 01/31/22 1625  BP: 122/66 123/63  Pulse: 66 64  Resp: 16 17  Temp: 98.4 F (36.9 C) 98.6 F (37 C)  SpO2: 97% 99%     General: Awake, no distress.  CV:  Good peripheral perfusion.  Resp:  Normal effort.  Abd:  No distention. Moderate distension with severe RUQ and epigastric TTP. Foul smelling drainage noted in cholecystostomy tube. Other:  No LE edema.   ED Results / Procedures / Treatments   Labs (all labs ordered are listed, but only abnormal results are displayed) Labs Reviewed  CBC WITH DIFFERENTIAL/PLATELET - Abnormal; Notable for the following components:      Result Value   RBC 3.90 (*)    Hemoglobin 11.7 (*)    HCT 36.0 (*)    Platelets 143 (*)    All other components within normal limits  COMPREHENSIVE METABOLIC PANEL - Abnormal; Notable for the following components:   Glucose, Bld 105 (*)     Calcium 8.3 (*)    All other components within normal limits  PROTIME-INR - Abnormal; Notable for the following components:   Prothrombin Time 19.5 (*)    INR 1.7 (*)    All other components within normal limits  LACTIC ACID, PLASMA - Abnormal; Notable for the following components:   Lactic Acid, Venous 0.4 (*)    All other components within normal limits  URINALYSIS, ROUTINE W REFLEX MICROSCOPIC - Abnormal; Notable for the following components:   Color, Urine YELLOW (*)    APPearance CLEAR (*)    Hgb urine dipstick MODERATE (*)    All other components within normal limits  LIPASE, BLOOD  LACTIC ACID, PLASMA  CBC  BASIC METABOLIC PANEL  MAGNESIUM  PHOSPHORUS  HEPATIC FUNCTION PANEL  PROTIME-INR  TYPE AND SCREEN     EKG    RADIOLOGY CT A/P: Cholecystostomy in similar position, no acute surgical abnormality   I also independently reviewed and agree with radiologist interpretations.  PROCEDURES:  Critical Care performed: No   MEDICATIONS ORDERED IN ED: Medications  lisinopril (ZESTRIL) tablet 5 mg (5 mg Oral Given 01/31/22 1741)  pravastatin (PRAVACHOL) tablet 20 mg (20 mg Oral Given 01/31/22 1741)  traMADol (ULTRAM) tablet 50 mg (has no administration in time range)  HYDROcodone-acetaminophen (NORCO/VICODIN) 5-325 MG per tablet 1-2 tablet (has no administration in time range)  morphine (PF) 2 MG/ML injection 2 mg (2 mg Intravenous Given 01/31/22 1742)  docusate sodium (COLACE) capsule 100 mg (has no administration in time range)  ondansetron (ZOFRAN-ODT) disintegrating tablet 4 mg (has no administration in time range)    Or  ondansetron (ZOFRAN) injection 4 mg (has no administration in time range)  0.9 %  sodium chloride infusion ( Intravenous New Bag/Given 01/31/22 1747)  pantoprazole (PROTONIX) injection 40 mg (has no administration in time range)  indocyanine green (IC-GREEN) injection 1.25 mg (has no administration in time range)  cefTRIAXone (ROCEPHIN) 2 g  in sodium chloride 0.9 % 100 mL IVPB (2 g Intravenous New Bag/Given 01/31/22 1750)  sodium chloride 0.9 % bolus 1,000 mL (1,000 mLs Intravenous New Bag/Given 01/31/22 1339)  ondansetron (ZOFRAN) injection 4 mg (4 mg Intravenous Given 01/31/22 1339)  morphine (PF) 4 MG/ML injection 4 mg (4 mg Intravenous Given 01/31/22 1339)     IMPRESSION / MDM / ASSESSMENT AND PLAN / ED COURSE  I reviewed the triage vital signs and the nursing notes.                             Ddx:  Differential includes the following, with pertinent life- or limb-threatening emergencies considered:  Acute on chronic cholecystitis, abscess, drain dislodgment, drain infection, sepsis  Patient's presentation is most consistent with acute presentation with potential threat to life or bodily function.  MDM:  56 yo M with PMHx CAD, HTN, HLD, Afib here with worsening abdominal pain. Concern for acute on chronic cholecystitis with increasing and foul-smelling drainage from cholecystostomy tube, which appears to still be functional and in place. Abdomen is tender but not rigid. He is HDS. Labs overall reassuring without major leukocytosis, normal LFTs, bili is at baseline. CT scan obtained, reviewed, shows no obstruction or other complication (non con 2/2 allergy). Discussed with Dr. Tonna Boehringer. Given ongoing and worsening pain with signs to suggest new infection, will admit for IV ABX and likely OR in AM. Pt updated and in agreement.    MEDICATIONS GIVEN IN ED: Medications  lisinopril (ZESTRIL) tablet 5 mg (5 mg Oral Given 01/31/22 1741)  pravastatin (PRAVACHOL) tablet 20 mg (20 mg Oral Given 01/31/22 1741)  traMADol (ULTRAM) tablet 50 mg (has no administration in time range)  HYDROcodone-acetaminophen (NORCO/VICODIN) 5-325 MG per tablet 1-2 tablet (has no administration in time range)  morphine (PF) 2 MG/ML injection 2 mg (2 mg Intravenous Given 01/31/22 1742)  docusate sodium (COLACE) capsule 100 mg (has no administration in time  range)  ondansetron (ZOFRAN-ODT) disintegrating tablet 4 mg (has no administration in time range)    Or  ondansetron (ZOFRAN) injection 4 mg (has no administration in time range)  0.9 %  sodium chloride infusion ( Intravenous New Bag/Given 01/31/22 1747)  pantoprazole (PROTONIX) injection 40 mg (has no administration in time range)  indocyanine green (IC-GREEN) injection 1.25 mg (has no administration in time range)  cefTRIAXone (ROCEPHIN) 2 g in sodium chloride 0.9 % 100 mL IVPB (2 g Intravenous New Bag/Given 01/31/22 1750)  sodium chloride 0.9 % bolus 1,000  mL (1,000 mLs Intravenous New Bag/Given 01/31/22 1339)  ondansetron (ZOFRAN) injection 4 mg (4 mg Intravenous Given 01/31/22 1339)  morphine (PF) 4 MG/ML injection 4 mg (4 mg Intravenous Given 01/31/22 1339)     Consults:  Dr. Tonna Boehringer, General Surgery   EMR reviewed  Dr. Maia Plan notes for General Surgery, prior HIDA scans and imaging     FINAL CLINICAL IMPRESSION(S) / ED DIAGNOSES   Final diagnoses:  Chronic cholecystitis     Rx / DC Orders   ED Discharge Orders     None        Note:  This document was prepared using Dragon voice recognition software and may include unintentional dictation errors.   Shaune Pollack, MD 01/31/22 [phone]

## 2022-02-01 ENCOUNTER — Inpatient Hospital Stay: Payer: Medicaid Other | Admitting: Anesthesiology

## 2022-02-01 ENCOUNTER — Encounter
Admission: EM | Disposition: A | Discharge status: Home / Self Care | Payer: Self-pay | Source: Home / Self Care | Attending: Surgery

## 2022-02-01 ENCOUNTER — Other Ambulatory Visit: Payer: Self-pay

## 2022-02-01 HISTORY — PX: CHOLECYSTECTOMY: SHX55

## 2022-02-01 LAB — BASIC METABOLIC PANEL
Anion gap: 3 — ABNORMAL LOW (ref 5–15)
BUN: 12 mg/dL (ref 6–20)
CO2: 26 mmol/L (ref 22–32)
Calcium: 7.7 mg/dL — ABNORMAL LOW (ref 8.9–10.3)
Chloride: 110 mmol/L (ref 98–111)
Creatinine, Ser: 1.01 mg/dL (ref 0.61–1.24)
GFR, Estimated: >60 mL/min (ref >60)
Glucose, Bld: 86 mg/dL (ref 70–99)
Potassium: 4.2 mmol/L (ref 3.5–5.1)
Sodium: 139 mmol/L (ref 135–145)

## 2022-02-01 LAB — TYPE AND SCREEN
ABO/RH(D): O POS
Antibody Screen: NEGATIVE

## 2022-02-01 LAB — CBC
HCT: 33.2 % — ABNORMAL LOW (ref 39.0–52.0)
Hemoglobin: 10.7 g/dL — ABNORMAL LOW (ref 13.0–17.0)
MCH: 30 pg (ref 26.0–34.0)
MCHC: 32.2 g/dL (ref 30.0–36.0)
MCV: 93 fL (ref 80.0–100.0)
Platelets: 122 10*3/uL — ABNORMAL LOW (ref 150–400)
RBC: 3.57 MIL/uL — ABNORMAL LOW (ref 4.22–5.81)
RDW: 13.8 % (ref 11.5–15.5)
WBC: 4.3 10*3/uL (ref 4.0–10.5)
nRBC: 0 % (ref 0.0–0.2)

## 2022-02-01 LAB — PHOSPHORUS: Phosphorus: 3 mg/dL (ref 2.5–4.6)

## 2022-02-01 LAB — HEPATIC FUNCTION PANEL
ALT: 24 U/L (ref 0–44)
AST: 35 U/L (ref 15–41)
Albumin: 3.1 g/dL — ABNORMAL LOW (ref 3.5–5.0)
Alkaline Phosphatase: 60 U/L (ref 38–126)
Bilirubin, Direct: 0.2 mg/dL (ref 0.0–0.2)
Indirect Bilirubin: 1 mg/dL — ABNORMAL HIGH (ref 0.3–0.9)
Total Bilirubin: 1.2 mg/dL (ref 0.3–1.2)
Total Protein: 6.1 g/dL — ABNORMAL LOW (ref 6.5–8.1)

## 2022-02-01 LAB — PROTIME-INR
INR: 1.6 — ABNORMAL HIGH (ref 0.8–1.2)
Prothrombin Time: 18.9 seconds — ABNORMAL HIGH (ref 11.4–15.2)

## 2022-02-01 LAB — MAGNESIUM: Magnesium: 2 mg/dL (ref 1.7–2.4)

## 2022-02-01 SURGERY — CHOLECYSTECTOMY, ROBOT-ASSISTED, LAPAROSCOPIC
Anesthesia: General | Site: Abdomen

## 2022-02-01 MED ORDER — LIDOCAINE HCL (CARDIAC) PF 100 MG/5ML IV SOSY
PREFILLED_SYRINGE | INTRAVENOUS | Status: DC | PRN
  Administered 2022-02-01: 100 mg via INTRAVENOUS

## 2022-02-01 MED ORDER — CEFAZOLIN SODIUM-DEXTROSE 2-4 GM/100ML-% IV SOLN
2.0000 g | INTRAVENOUS | Status: AC
  Administered 2022-02-01: 2 g via INTRAVENOUS
  Filled 2022-02-01: qty 100

## 2022-02-01 MED ORDER — INDOCYANINE GREEN 25 MG IV SOLR
1.2500 mg | Freq: Once | INTRAVENOUS | Status: AC
  Administered 2022-02-01: 1.25 mg via INTRAVENOUS
  Filled 2022-02-01: qty 0.5

## 2022-02-01 MED ORDER — FIBRIN SEALANT 2 ML SINGLE DOSE KIT
PACK | CUTANEOUS | Status: DC | PRN
  Administered 2022-02-01: 2 mL via TOPICAL

## 2022-02-01 MED ORDER — MIDAZOLAM HCL 2 MG/2ML IJ SOLN
INTRAMUSCULAR | Status: AC
  Filled 2022-02-01: qty 2

## 2022-02-01 MED ORDER — DEXAMETHASONE SODIUM PHOSPHATE 10 MG/ML IJ SOLN
INTRAMUSCULAR | Status: AC
  Filled 2022-02-01: qty 1

## 2022-02-01 MED ORDER — FENTANYL CITRATE (PF) 100 MCG/2ML IJ SOLN
25.0000 ug | INTRAMUSCULAR | Status: DC | PRN
  Administered 2022-02-01: 25 ug via INTRAVENOUS

## 2022-02-01 MED ORDER — EPHEDRINE SULFATE (PRESSORS) 50 MG/ML IJ SOLN
INTRAMUSCULAR | Status: DC | PRN
  Administered 2022-02-01: 5 mg via INTRAVENOUS

## 2022-02-01 MED ORDER — DEXAMETHASONE SODIUM PHOSPHATE 10 MG/ML IJ SOLN
INTRAMUSCULAR | Status: DC | PRN
  Administered 2022-02-01: 10 mg via INTRAVENOUS

## 2022-02-01 MED ORDER — ACETAMINOPHEN 10 MG/ML IV SOLN
INTRAVENOUS | Status: AC
  Filled 2022-02-01: qty 100

## 2022-02-01 MED ORDER — OXYCODONE HCL 5 MG/5ML PO SOLN: 5.0000 mg | Freq: Once | ORAL | Status: DC | PRN

## 2022-02-01 MED ORDER — SODIUM CHLORIDE 0.9 % IV SOLN
INTRAVENOUS | Status: AC | PRN
  Administered 2022-02-01: 1000 mL

## 2022-02-01 MED ORDER — LIDOCAINE HCL (PF) 2 % IJ SOLN
INTRAMUSCULAR | Status: AC
  Filled 2022-02-01: qty 5

## 2022-02-01 MED ORDER — ROCURONIUM BROMIDE 10 MG/ML (PF) SYRINGE
PREFILLED_SYRINGE | INTRAVENOUS | Status: AC
  Filled 2022-02-01: qty 10

## 2022-02-01 MED ORDER — LIDOCAINE-EPINEPHRINE (PF) 1 %-1:200000 IJ SOLN
INTRAMUSCULAR | Status: DC | PRN
  Administered 2022-02-01: 23 mL

## 2022-02-01 MED ORDER — FENTANYL CITRATE (PF) 100 MCG/2ML IJ SOLN
INTRAMUSCULAR | Status: AC
  Filled 2022-02-01: qty 2

## 2022-02-01 MED ORDER — ACETAMINOPHEN 10 MG/ML IV SOLN
1000.0000 mg | Freq: Once | INTRAVENOUS | Status: DC | PRN
Start: 2022-02-01 — End: 2022-02-01
  Administered 2022-02-01: 1000 mg via INTRAVENOUS

## 2022-02-01 MED ORDER — PROPOFOL 10 MG/ML IV BOLUS
INTRAVENOUS | Status: DC | PRN
  Administered 2022-02-01: 30 mg via INTRAVENOUS
  Administered 2022-02-01: 100 mg via INTRAVENOUS

## 2022-02-01 MED ORDER — ROCURONIUM BROMIDE 100 MG/10ML IV SOLN
INTRAVENOUS | Status: DC | PRN
  Administered 2022-02-01: 10 mg via INTRAVENOUS
  Administered 2022-02-01: 20 mg via INTRAVENOUS
  Administered 2022-02-01: 50 mg via INTRAVENOUS

## 2022-02-01 MED ORDER — PROPOFOL 10 MG/ML IV BOLUS
INTRAVENOUS | Status: AC
  Filled 2022-02-01: qty 20

## 2022-02-01 MED ORDER — PHENYLEPHRINE HCL (PRESSORS) 10 MG/ML IV SOLN
INTRAVENOUS | Status: DC | PRN
  Administered 2022-02-01: 160 ug via INTRAVENOUS
  Administered 2022-02-01 (×4): 80 ug via INTRAVENOUS
  Administered 2022-02-01: 160 ug via INTRAVENOUS
  Administered 2022-02-01: 80 ug via INTRAVENOUS
  Administered 2022-02-01: 160 ug via INTRAVENOUS
  Administered 2022-02-01: 80 ug via INTRAVENOUS

## 2022-02-01 MED ORDER — PHENYLEPHRINE HCL-NACL 20-0.9 MG/250ML-% IV SOLN
INTRAVENOUS | Status: AC
  Filled 2022-02-01: qty 250

## 2022-02-01 MED ORDER — OXYCODONE HCL 5 MG PO TABS: 5.0000 mg | ORAL_TABLET | Freq: Once | ORAL | Status: DC | PRN

## 2022-02-01 MED ORDER — PROMETHAZINE HCL 25 MG/ML IJ SOLN: 6.2500 mg | INTRAMUSCULAR | Status: DC | PRN

## 2022-02-01 MED ORDER — FENTANYL CITRATE (PF) 100 MCG/2ML IJ SOLN
INTRAMUSCULAR | Status: DC | PRN
  Administered 2022-02-01: 100 ug via INTRAVENOUS

## 2022-02-01 MED ORDER — 0.9 % SODIUM CHLORIDE (POUR BTL) OPTIME
TOPICAL | Status: DC | PRN
  Administered 2022-02-01: 500 mL

## 2022-02-01 MED ORDER — SUGAMMADEX SODIUM 200 MG/2ML IV SOLN
INTRAVENOUS | Status: DC | PRN
  Administered 2022-02-01: 180 mg via INTRAVENOUS

## 2022-02-01 MED ORDER — CEFAZOLIN SODIUM-DEXTROSE 2-4 GM/100ML-% IV SOLN
INTRAVENOUS | Status: AC
  Filled 2022-02-01: qty 100

## 2022-02-01 MED ORDER — MIDAZOLAM HCL 2 MG/2ML IJ SOLN
INTRAMUSCULAR | Status: DC | PRN
  Administered 2022-02-01: 1 mg via INTRAVENOUS

## 2022-02-01 MED ORDER — ONDANSETRON HCL 4 MG/2ML IJ SOLN
INTRAMUSCULAR | Status: AC
  Filled 2022-02-01: qty 2

## 2022-02-01 MED ORDER — DROPERIDOL 2.5 MG/ML IJ SOLN: 0.6250 mg | Freq: Once | INTRAMUSCULAR | Status: DC | PRN

## 2022-02-01 SURGICAL SUPPLY — 69 items
ADH SKN CLS APL DERMABOND .7 (GAUZE/BANDAGES/DRESSINGS) ×1
ANCHOR TIS RET SYS 235ML (MISCELLANEOUS) ×2 IMPLANT
APL LAPSCP 45 DL APL FL B (MISCELLANEOUS) ×1
APPLICATOR VISTASEAL FLEXIBLE (MISCELLANEOUS) IMPLANT
BAG PRESSURE INF REUSE 1000 (BAG) IMPLANT
BAG TISS RTRVL C235 10X14 (MISCELLANEOUS) ×1
BANDAID 1X3 IMPLANT
BLADE SURG SZ11 CARB STEEL (BLADE) ×2 IMPLANT
BULB RESERV EVAC DRAIN JP 100C (MISCELLANEOUS) IMPLANT
CANNULA REDUC XI 12-8 STAPL (CANNULA) ×1
CANNULA REDUCER 12-8 DVNC XI (CANNULA) ×2 IMPLANT
CATH REDDICK CHOLANGI 4FR 50CM (CATHETERS) IMPLANT
CLIP LIGATING HEMO O LOK GREEN (MISCELLANEOUS) ×2 IMPLANT
DERMABOND IMPLANT
DERMABOND ADVANCED (GAUZE/BANDAGES/DRESSINGS) ×1
DERMABOND ADVANCED .7 DNX12 (GAUZE/BANDAGES/DRESSINGS) ×2 IMPLANT
DRAIN CHANNEL JP 15F RND 16 (MISCELLANEOUS) IMPLANT
DRAPE ARM DVNC X/XI (DISPOSABLE) ×8 IMPLANT
DRAPE C-ARM XRAY 36X54 (DRAPES) IMPLANT
DRAPE COLUMN DVNC XI (DISPOSABLE) ×2 IMPLANT
DRAPE DA VINCI XI ARM (DISPOSABLE) ×4
DRAPE DA VINCI XI COLUMN (DISPOSABLE) ×1
ELECT CAUTERY BLADE 6.4 (BLADE) ×2 IMPLANT
ELECT REM PT RETURN 9FT ADLT (ELECTROSURGICAL) ×1
ELECTRODE REM PT RTRN 9FT ADLT (ELECTROSURGICAL) ×2 IMPLANT
GLOVE BIOGEL PI IND STRL 7.0 (GLOVE) ×4 IMPLANT
GLOVE BIOGEL PI INDICATOR 7.0 (GLOVE) ×2
GLOVE SURG SYN 6.5 ES PF (GLOVE) ×2 IMPLANT
GLOVE SURG SYN 6.5 PF PI (GLOVE) ×4 IMPLANT
GOWN STRL REUS W/ TWL LRG LVL3 (GOWN DISPOSABLE) ×6 IMPLANT
GOWN STRL REUS W/TWL LRG LVL3 (GOWN DISPOSABLE) ×3
GRASPER SUT TROCAR 14GX15 (MISCELLANEOUS) IMPLANT
HOLDER FOLEY CATH W/STRAP (MISCELLANEOUS) IMPLANT
IRRIGATOR SUCT 8 DISP DVNC XI (IRRIGATION / IRRIGATOR) IMPLANT
IRRIGATOR SUCTION 8MM XI DISP (IRRIGATION / IRRIGATOR) ×1
IV NS 1000ML (IV SOLUTION) ×1
IV NS 1000ML BAXH (IV SOLUTION) IMPLANT
LABEL OR SOLS (LABEL) ×2 IMPLANT
MANIFOLD NEPTUNE II (INSTRUMENTS) ×2 IMPLANT
NDL INSUFFLATION 14GA 120MM (NEEDLE) ×2 IMPLANT
NEEDLE HYPO 22GX1.5 SAFETY (NEEDLE) ×2 IMPLANT
NEEDLE INSUFFLATION 14GA 120MM (NEEDLE) ×1 IMPLANT
NS IRRIG 500ML POUR BTL (IV SOLUTION) ×2 IMPLANT
OBTURATOR OPTICAL STANDARD 8MM (TROCAR) ×1
OBTURATOR OPTICAL STND 8 DVNC (TROCAR) ×1
OBTURATOR OPTICALSTD 8 DVNC (TROCAR) ×2 IMPLANT
PACK LAP CHOLECYSTECTOMY (MISCELLANEOUS) ×2 IMPLANT
PENCIL SMOKE EVACUATOR (MISCELLANEOUS) ×2 IMPLANT
SEAL CANN UNIV 5-8 DVNC XI (MISCELLANEOUS) ×6 IMPLANT
SEAL XI 5MM-8MM UNIVERSAL (MISCELLANEOUS) ×3
SET TUBE SMOKE EVAC HIGH FLOW (TUBING) ×2 IMPLANT
SOLUTION ELECTROLUBE (MISCELLANEOUS) ×2 IMPLANT
SPIKE FLUID TRANSFER (MISCELLANEOUS) ×4 IMPLANT
SPONGE DRAIN TRACH 4X4 STRL 2S (GAUZE/BANDAGES/DRESSINGS) IMPLANT
STAPLER CANNULA SEAL DVNC XI (STAPLE) ×2 IMPLANT
STAPLER CANNULA SEAL XI (STAPLE) ×1
SUT ETHILON 3-0 FS-10 30 BLK (SUTURE) ×1
SUT MNCRL 4-0 (SUTURE) ×2
SUT MNCRL 4-0 27XMFL (SUTURE) ×2
SUT SILK 3 0 SH 30 (SUTURE) IMPLANT
SUT VICRYL 0 AB UR-6 (SUTURE) ×2 IMPLANT
SUTURE EHLN 3-0 FS-10 30 BLK (SUTURE) IMPLANT
SUTURE MNCRL 4-0 27XMF (SUTURE) ×4 IMPLANT
SYR 30ML LL (SYRINGE) IMPLANT
SYSTEM WECK SHIELD CLOSURE (TROCAR) IMPLANT
TAPE PAPER 3X10 WHT MICROPORE (GAUZE/BANDAGES/DRESSINGS) IMPLANT
TRAP FLUID SMOKE EVACUATOR (MISCELLANEOUS) ×2 IMPLANT
TRAY FOLEY MTR SLVR 16FR STAT (SET/KITS/TRAYS/PACK) IMPLANT
WATER STERILE IRR 500ML POUR (IV SOLUTION) ×2 IMPLANT

## 2022-02-01 NOTE — Interval H&P Note (Signed)
History and Physical Interval Note:  02/01/2022 7:56 AM  Ryan [name]  has presented today for surgery, with the diagnosis of Acute Cholecystatis.  The various methods of treatment have been discussed with the patient and family. After consideration of risks, benefits and other options for treatment, the patient has consented to  Procedure(s): XI ROBOTIC ASSISTED LAPAROSCOPIC CHOLECYSTECTOMY (N/A) as a surgical intervention.  The patient's history has been reviewed, patient examined, no change in status, stable for surgery.  I have reviewed the patient's chart and labs.  Questions were answered to the patient's satisfaction.     Yannis Gumbs Tonna Boehringer

## 2022-02-01 NOTE — Anesthesia Procedure Notes (Addendum)
Procedure Name: Intubation Date/Time: 02/01/2022 8:21 AM  Performed by: Ginger Carne, CRNAPre-anesthesia Checklist: Patient identified, Emergency Drugs available, Suction available, Patient being monitored and Timeout performed Patient Re-evaluated:Patient Re-evaluated prior to induction Oxygen Delivery Method: Circle system utilized Preoxygenation: Pre-oxygenation with 100% oxygen Induction Type: IV induction Ventilation: Mask ventilation without difficulty and Oral airway inserted - appropriate to patient size Laryngoscope Size: McGraph and 3 Grade View: Grade I Tube type: Oral Tube size: 7.5 mm Number of attempts: 1 Airway Equipment and Method: Video-laryngoscopy and Stylet Placement Confirmation: ETT inserted through vocal cords under direct vision, positive ETCO2 and breath sounds checked- equal and bilateral Secured at: 21 cm Tube secured with: Tape Dental Injury: Teeth and Oropharynx as per pre-operative assessment

## 2022-02-01 NOTE — Anesthesia Procedure Notes (Signed)
Arterial Line Insertion Start/End8/20/2023 8:14 AM, 02/01/2022 8:20 AM Performed by: Foye Deer, MD, anesthesiologist  Patient location: OR. Preanesthetic checklist: patient identified, IV checked, site marked, risks and benefits discussed, surgical consent, monitors and equipment checked, pre-op evaluation and timeout performed Lidocaine 1% used for infiltration and patient sedated Left, radial was placed Catheter size: 20 G Hand hygiene performed  Allen's test indicative of satisfactory collateral circulation Attempts: 1 Procedure performed without using ultrasound guided technique. Following insertion, dressing applied and Biopatch. Post procedure assessment: normal  Patient tolerated the procedure well with no immediate complications.

## 2022-02-01 NOTE — Anesthesia Postprocedure Evaluation (Signed)
Anesthesia Post Note  Patient: Ryan [name]  Procedure(s) Performed: XI ROBOTIC ASSISTED LAPAROSCOPIC CHOLECYSTECTOMY (Abdomen) INDOCYANINE GREEN FLUORESCENCE IMAGING (ICG) (Abdomen)  Patient location during evaluation: PACU Anesthesia Type: General Level of consciousness: awake and alert Pain management: pain level controlled Vital Signs Assessment: post-procedure vital signs reviewed and stable Respiratory status: spontaneous breathing, nonlabored ventilation and respiratory function stable Cardiovascular status: blood pressure returned to baseline and stable Postop Assessment: no apparent nausea or vomiting Anesthetic complications: no   No notable events documented.   Last Vitals:  Vitals:   02/01/22 1219 02/01/22 1230  BP: (!) 90/55 (!) 98/58  Pulse: 74 75  Resp: (!) 21 18  Temp:  (!) 36.3 C  SpO2: 92% 93%    Last Pain:  Vitals:   02/01/22 1200  TempSrc:   PainSc: Asleep                 Foye Deer

## 2022-02-01 NOTE — Anesthesia Procedure Notes (Deleted)
Procedure Name: Intubation Date/Time: 02/01/2022 8:21 AM  Performed by: Johnna Acosta, CRNAPre-anesthesia Checklist: Patient identified, Patient being monitored, Timeout performed, Emergency Drugs available and Suction available Patient Re-evaluated:Patient Re-evaluated prior to induction Oxygen Delivery Method: Circle system utilized Preoxygenation: Pre-oxygenation with 100% oxygen Induction Type: IV induction Ventilation: Mask ventilation without difficulty Laryngoscope Size: Mac, 3 and McGraph Grade View: Grade I Tube type: Oral Tube size: 7.5 mm Number of attempts: 1 Airway Equipment and Method: Stylet Placement Confirmation: ETT inserted through vocal cords under direct vision, positive ETCO2 and breath sounds checked- equal and bilateral Secured at: 21 cm Tube secured with: Tape Dental Injury: Teeth and Oropharynx as per pre-operative assessment
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
## 2022-02-01 NOTE — Transfer of Care (Signed)
Immediate Anesthesia Transfer of Care Note  Patient: Ryan [name]  Procedure(s) Performed: XI ROBOTIC ASSISTED LAPAROSCOPIC CHOLECYSTECTOMY (Abdomen) INDOCYANINE GREEN FLUORESCENCE IMAGING (ICG) (Abdomen)  Patient Location: PACU  Anesthesia Type:General  Level of Consciousness: awake and drowsy  Airway & Oxygen Therapy: Patient Spontanous Breathing and Patient connected to face mask oxygen  Post-op Assessment: Report given to RN and Post -op Vital signs reviewed and stable  Post vital signs: Reviewed and stable  Last Vitals:  Vitals Value Taken Time  BP 98/53 02/01/22 1126  Temp 36.4 C 02/01/22 1124  Pulse 72 02/01/22 1126  Resp 24 02/01/22 1126  SpO2 97 % 02/01/22 1126  Vitals shown include unvalidated device data.  Last Pain:  Vitals:   02/01/22 1124  TempSrc:   PainSc: Asleep      Patients Stated Pain Goal: 3 (01/31/22 1812)  Complications: No notable events documented.

## 2022-02-01 NOTE — Op Note (Signed)
Preoperative diagnosis:  chronic and cholecystitis  Postoperative diagnosis: same as above  Procedure: Robotic assisted Laparoscopic Cholecystectomy.   Anesthesia: GETA   Surgeon: Sung Amabile  Specimen: Gallbladder  Complications: None  EBL: 5mL  Wound Classification: Clean Contaminated  Indications: see HPI  Findings: Critical view of safety noted Cystic duct and artery identified, ligated and divided, clips remained intact at end of procedure Adequate hemostasis  Description of procedure:  The patient was placed on the operating table in the supine position. SCDs placed, pre-op abx administered.  General anesthesia was induced and OG tube placed by anesthesia. A time-out was completed verifying correct patient, procedure, site, positioning, and implant(s) and/or special equipment prior to beginning this procedure. The abdomen was prepped and draped in the usual sterile fashion.    Veress needle was placed at the Palmer's point and insufflation was started after confirming a positive saline drop test and no immediate increase in abdominal pressure.  After reaching 15 mm, the Veress needle was removed and a 8 mm port was placed via optiview technique under umbilicus measured 76mm from gallbladder.  The abdomen was inspected and no abnormalities or injuries were found.  Under direct vision, ports were placed in the following locations: One 12 mm patient left of the umbilicus, 8cm from the optiviewed port, one 8 mm port placed to the patient right of the umbilical port 8 cm apart.  1 additional 8 mm port placed lateral to the 68mm port.  Once ports were placed, The table was placed in the reverse Trendelenburg position with the right side up. The Xi platform was brought into the operative field and docked to the ports successfully.  An endoscope was placed through the umbilical port, fenestrated grasper through the adjacent patient right port, prograsp to the far patient left port, and  then a hook cautery in the left port.  Adhesions of omentum covering the gallbladder and drain lysed.  Drain removed and the dome of the gallbladder was grasped with prograsp, passed and retracted over the dome of the liver. Additional extensive between the gallbladder and omentum, duodenum and transverse colon were lysed via hook cautery. The infundibulum was grasped with the fenestrated grasper and retracted toward the right lower quadrant. This maneuver exposed area of presumed Calot's triangle. The scarred peritoneum overlying the gallbladder infundibulum was then dissected.  Critical structures eventually identified after enlarging drain entry site and following the gallbladder down to narrowed opening of cystic duct from the inside. The cystic duct and cystic artery clipped and divided close to the gallbladder.     The gallbladder was then dissected from its peritoneal and liver bed attachments by electrocautery. Extensive scarring again noted and portion of liver injured while attempted separation of gallbladder.  Moderate bleeding controlled with pressure. The thickened peritoneum on the fossa side was separated from the gallbladder in the area of injury and after confirming no active bleeding in the area, the peritoneal flap was secured over in injury with 3-0 silk x2.  The remaining gallbladder and contents then separated off liver bed.   Vistaseal sprayed to fossa area. Hemostasis was checked prior to removing the hook cautery and the Endo Catch bag was then placed through the 12 mm port and the gallbladder was removed.  The gallbladder was passed off the table as a specimen. There was no evidence of bleeding from the gallbladder fossa or cystic artery or leakage of the bile from the cystic duct stump. 12Fr round drain placed through RLQ port  and laying around gallbladder fossa, secured to skin using 3-0 nylon   The 12 mm port site closed with PMI using 0 vicryl  and additional 0 vicryl under  direct vision.  Abdomen desufflated and secondary trocars were removed under direct vision. No bleeding was noted. All skin incisions then closed with subcuticular sutures of 4-0 monocryl and dressed with topical skin adhesive. Drain site dressed with drain sponge and former chole tube site dressed with band-aid. The orogastric tube was removed and patient extubated.  The patient tolerated the procedure well and was taken to the postanesthesia care unit in stable condition.  All sponge and instrument count correct at end of procedure.

## 2022-02-01 NOTE — Addendum Note (Signed)
Addendum  created 02/01/22 1322 by Foye Deer, MD   Review and Sign - Ready for Procedure

## 2022-02-02 MED ORDER — ENOXAPARIN SODIUM 40 MG/0.4ML IJ SOSY
40.0000 mg | PREFILLED_SYRINGE | INTRAMUSCULAR | Status: DC
  Administered 2022-02-03: 40 mg via SUBCUTANEOUS
  Filled 2022-02-02: qty 0.4

## 2022-02-02 NOTE — Progress Notes (Signed)
Subjective:  CC: Ryan [name] is a 56 y.o. male  Hospital stay day 2, 1 Day Post-Op robotic assisted lap chole for chronic cholecystitis  HPI: .  No issues overnight.  ROS:  General: Denies weight loss, weight gain, fatigue, fevers, chills, and night sweats. Heart: Denies chest pain, palpitations, racing heart, irregular heartbeat, leg pain or swelling, and decreased activity tolerance. Respiratory: Denies breathing difficulty, shortness of breath, wheezing, cough, and sputum. GI: Denies change in appetite, heartburn, nausea, vomiting, constipation, diarrhea, and blood in stool. GU: Denies difficulty urinating, pain with urinating, urgency, frequency, blood in urine.   Objective:   Temp:  [97.8 F (36.6 C)-98.3 F (36.8 C)] 97.8 F (36.6 C) (08/21 0920) Pulse Rate:  [61-67] 65 (08/21 0920) Resp:  [17-18] 17 (08/21 0920) BP: (104-123)/(61-71) 123/71 (08/21 0920) SpO2:  [94 %-96 %] 96 % (08/21 0920)     Height: 5\' 7"  (170.2 cm) Weight: 81.3 kg BMI (Calculated): 28.07   Intake/Output this shift:   Intake/Output Summary (Last 24 hours) at 02/02/2022 1442 Last data filed at 02/02/2022 1413 Gross per 24 hour  Intake 1808.82 ml  Output 500 ml  Net 1308.82 ml    Constitutional :  alert, cooperative, appears stated age, and no distress  Respiratory:  clear to auscultation bilaterally  Cardiovascular:  regular rate and rhythm  Gastrointestinal: Soft, no guarding, focal tenderness to palpation around drain insertion site.  JP drain with serosanguineous drainage. .   Skin: Cool and moist.  Incisions clean dry and intact.  Psychiatric: Normal affect, non-agitated, not confused       LABS:     Latest Ref Rng & Units 02/01/2022    4:41 AM 01/31/2022    1:18 PM 01/05/2022   10:21 PM  CMP  Glucose 70 - 99 mg/dL 86  01/07/2022  757   BUN 6 - 20 mg/dL 12  9  14    Creatinine 0.61 - 1.24 mg/dL 972   8.20   Sodium 135 - 145 mmol/L 139  137  137   Potassium 3.5 - 5.1 mmol/L 4.2  4.1  3.8    Chloride 98 - 111 mmol/L 110  107  103   CO2 22 - 32 mmol/L 26  25  27    Calcium 8.9 - 10.3 mg/dL 7.7  8.3  8.9   Total Protein 6.5 - 8.1 g/dL 6.1  6.7  7.6   Total Bilirubin 0.3 - 1.2 mg/dL 1.2  0.9  1.1   Alkaline Phos 38 - 126 U/L 60  53  66   AST 15 - 41 U/L 35  19  33   ALT 0 - 44 U/L 24  13  25        Latest Ref Rng & Units 02/01/2022    4:41 AM 01/31/2022    1:18 PM 01/05/2022   10:21 PM  CBC  WBC 4.0 - 10.5 K/uL 4.3  6.1  7.1   Hemoglobin 13.0 - 17.0 g/dL  02/03/2022  02/02/2022   Hematocrit 39.0 - 52.0 % 33.2  36.0  40.4   Platelets 150 - 400 K/uL 122  143  149     RADS: N/a Assessment:   S/p robotic assisted lap chole for chronic cholecystitis.  Recovering well.  We will start clear liquid diet and advance diet as tolerated.  labs/images/medications/previous chart entries reviewed personally and relevant changes/updates noted above.

## 2022-02-03 ENCOUNTER — Inpatient Hospital Stay: Admission: RE | Admit: 2022-02-03 | Payer: Medicaid Other | Source: Ambulatory Visit

## 2022-02-03 LAB — SURGICAL PATHOLOGY

## 2022-02-03 MED ORDER — ORAL CARE MOUTH RINSE: 15.0000 mL | OROMUCOSAL | Status: DC | PRN

## 2022-02-03 MED ORDER — OXYCODONE-ACETAMINOPHEN 5-325 MG PO TABS
1.0000 | ORAL_TABLET | ORAL | 0 refills | Status: DC | PRN
Start: 2022-02-03 — End: 2022-07-16

## 2022-02-03 NOTE — Plan of Care (Signed)
  Problem: Education: Goal: Knowledge of General Education information will improve Description: Including pain rating scale, medication(s)/side effects and non-pharmacologic comfort measures Outcome: Progressing   Problem: Health Behavior/Discharge Planning: Goal: Ability to manage health-related needs will improve Outcome: Progressing   Problem: Clinical Measurements: Goal: Ability to maintain clinical measurements within normal limits will improve Outcome: Progressing   Problem: Clinical Measurements: Goal: Will remain free from infection Outcome: Progressing   Problem: Clinical Measurements: Goal: Diagnostic test results will improve Outcome: Progressing   Problem: Pain Managment: Goal: General experience of comfort will improve Outcome: Progressing   Problem: Safety: Goal: Ability to remain free from injury will improve Outcome: Progressing   Problem: Skin Integrity: Goal: Risk for impaired skin integrity will decrease Outcome: Progressing

## 2022-02-03 NOTE — Discharge Instructions (Signed)
Laparoscopic Cholecystectomy, Care After This sheet gives you information about how to care for yourself after your procedure. Your doctor may also give you more specific instructions. If you have problems or questions, contact your doctor. Follow these instructions at home: Care for cuts from surgery (incisions)  Follow instructions from your doctor about how to take care of your cuts from surgery. Make sure you: Wash your hands with soap and water before you change your bandage (dressing). If you cannot use soap and water, use hand sanitizer. Change your bandage as told by your doctor. Leave stitches (sutures), skin glue, or skin tape (adhesive) strips in place. They may need to stay in place for 2 weeks or longer. If tape strips get loose and curl up, you may trim the loose edges. Do not remove tape strips completely unless your doctor says it is okay. Do not take baths, swim, or use a hot tub until your doctor says it is okay. OK TO SHOWER 48HRS AFTER YOUR SURGERY.  Check your surgical cut area every day for signs of infection. Check for: More redness, swelling, or pain. More fluid or blood. Warmth. Pus or a bad smell. Activity Do not drive or use heavy machinery while taking prescription pain medicine. Do not play contact sports until your doctor says it is okay. Do not drive for 24 hours if you were given a medicine to help you relax (sedative). Rest as needed. Do not return to work or school until your doctor says it is okay. General instructions tylenol and advil as needed for discomfort.  Please alternate between the two every four hours as needed for pain.    Use narcotics, if prescribed, only when tylenol and motrin is not enough to control pain.  325-650mg  every 8hrs to max of 3000mg /24hrs (including the 325mg  in every norco dose) for the tylenol.    Advil up to 800mg  per dose every 8hrs as needed for pain.   To prevent or treat constipation while you are taking prescription pain  medicine, your doctor may recommend that you: Drink enough fluid to keep your pee (urine) clear or pale yellow. Take over-the-counter or prescription medicines. Eat foods that are high in fiber, such as fresh fruits and vegetables, whole grains, and beans. Limit foods that are high in fat and processed sugars, such as fried and sweet foods. Contact a doctor if: You develop a rash. You have more redness, swelling, or pain around your surgical cuts. You have more fluid or blood coming from your surgical cuts. Your surgical cuts feel warm to the touch. You have pus or a bad smell coming from your surgical cuts. You have a fever. One or more of your surgical cuts breaks open. You have trouble breathing. You have chest pain. You have pain that is getting worse in your shoulders. You faint or feel dizzy when you stand. You have very bad pain in your belly (abdomen). You are sick to your stomach (nauseous) for more than one day. You have throwing up (vomiting) that lasts for more than one day. You have leg pain. This information is not intended to replace advice given to you by your health care provider. Make sure you discuss any questions you have with your health care provider. Document Released: 03/10/2008 Document Revised: 12/21/2015 Document Reviewed: 11/18/2015 Elsevier Interactive Patient Education  2019 03/12/2008.

## 2022-02-03 NOTE — TOC Transition Note (Signed)
Transition of Care [facility]) - CM/SW Discharge Note   Patient Details  Name: Ryan [name] MRN: [id number] Date of Birth: July 19, 1965  [facility]) CM/SW Contact:  Chapman Fitch, RN Phone Number: 02/03/2022, 9:58 AM   Clinical Narrative:    DC pain medication sent to [facility].  No goodrx coupon available. Patient confirms he will be able to obtained.   Patient confirms he will have transportation today for discharge      Barriers to Discharge: Continued Medical Work up   Patient Goals and CMS Choice        Discharge Placement                       Discharge Plan and Services                                     Social Determinants of Health (SDOH) Interventions     Readmission Risk Interventions    02/01/2022    2:40 PM 12/28/2021   11:20 AM 12/22/2021    2:13 PM  Readmission Risk Prevention Plan  Transportation Screening Complete Complete Complete  PCP or Specialist Appt within 5-7 Days   Complete  PCP or Specialist Appt within 3-5 Days  Complete   Home Care Screening   Not Complete  Home Care Screening Not Completed Comments   n/a  Medication Review (RN CM)   Complete  Social Work Consult for Recovery Care Planning/Counseling  Complete   Palliative Care Screening  Not Applicable   Medication Review Oceanographer) Complete Complete   Palliative Care Screening Not Applicable    Skilled Nursing Facility Not Applicable

## 2022-02-04 ENCOUNTER — Ambulatory Visit (INDEPENDENT_AMBULATORY_CARE_PROVIDER_SITE_OTHER): Payer: Medicaid Other

## 2022-02-04 ENCOUNTER — Telehealth: Payer: Self-pay | Admitting: Internal Medicine

## 2022-02-04 DIAGNOSIS — Z952 Presence of prosthetic heart valve: Secondary | ICD-10-CM

## 2022-02-04 DIAGNOSIS — Z7901 Long term (current) use of anticoagulants: Secondary | ICD-10-CM

## 2022-02-04 DIAGNOSIS — Z5181 Encounter for therapeutic drug level monitoring: Secondary | ICD-10-CM

## 2022-02-04 LAB — POCT INR: INR: 1.1 — AB (ref 2.0–3.0)

## 2022-02-04 NOTE — Telephone Encounter (Signed)
New Message:    Patient said he had his gallbladder removed on Sunday. He wants to know if he still needs to come in to get his Coumadin drawn?

## 2022-02-04 NOTE — Telephone Encounter (Signed)
I spoke to the patient and advised him to come in for INR check.  He verbalized understanding

## 2022-02-04 NOTE — Patient Instructions (Signed)
TAKE 1.5 TABLETS TODAY ONLY and then CONTINUE 1 tablet every day EXCEPT 0.5 tablet on Monday, Wednesday and Friday - Recheck INR in 1 week

## 2022-02-05 NOTE — Discharge Summary (Signed)
Physician Discharge Summary  Patient ID: Ryan [name] MRN: [id number] DOB/AGE: 54/10/1965 54 y.o.  Admit date: 01/31/2022 Discharge date: 02/03/22  Admission Diagnoses: chronic chole  Discharge Diagnoses:  Same as above  Discharged Condition: good  Hospital Course: admitted for above. Underwent surgery.  Please see op note for details.  Post op, recovered as expected.  At time of d/c, tolerating diet and pain controlled  Consults: None  Discharge Exam: Blood pressure (!) 96/59, pulse 65, temperature 98.6 F (37 C), resp. rate 17, height 5\' 7"  (1.702 m), weight 81.3 kg, SpO2 96 %. General appearance: alert, cooperative, and no distress GI: soft, non-tender; bowel sounds normal; no masses,  no organomegaly  Disposition:  Discharge disposition: 01-Home or Self Care        Allergies as of 02/03/2022       Reactions   Alitraq Rash   Iodinated Contrast Media Rash   Iodine Rash   CT dye CT dye CT dye CT dye        Medication List     STOP taking these medications    amoxicillin-clavulanate 500-125 MG tablet Commonly known as: AUGMENTIN       TAKE these medications    acetaminophen 500 MG tablet Commonly known as: TYLENOL Take 500 mg by mouth every 6 (six) hours as needed for headache, fever, moderate pain or mild pain.   aspirin EC 81 MG tablet Take 81 mg by mouth daily. Notes to patient: Resume on 02/04/22   High Potency Iron 65 MG Tabs Take 2 tablets by mouth daily.   lisinopril 5 MG tablet Commonly known as: ZESTRIL Take 1 tablet (5 mg total) by mouth daily.   lovastatin 20 MG tablet Commonly known as: MEVACOR Take 1 tablet (20 mg total) by mouth at bedtime.   oxyCODONE-acetaminophen 5-325 MG tablet Commonly known as: PERCOCET/ROXICET Take 1 tablet by mouth every 4 (four) hours as needed for severe pain.   Potassium 99 MG Tabs Take 2 tablets by mouth every evening.   warfarin 5 MG tablet Commonly known as: COUMADIN Take as directed. If you  are unsure how to take this medication, talk to your nurse or doctor. Original instructions: Take 0.5-1 tablets (2.5-5 mg total) by mouth daily at 4 PM. Take 5 mg Monday, Weds, Sat. 2.5 mg on all other days Notes to patient: Please call coumadin clinic to resume starting 02/04/22        Follow-up Information     [place], Shanira Tine, DO. Go on 02/10/2022.   Specialty: Surgery Why: Appointment @ 9:00 am. Contact information: [address] [place] [phone]         COUMADIN CLINIC Follow up on 02/04/2022.   Why: to resume coumadin after surgery                 Total time spent arranging discharge was >51min. Signed: 31m 02/05/2022, 9:09 AM

## 2022-02-11 ENCOUNTER — Encounter: Admission: RE | Payer: Self-pay | Source: Ambulatory Visit

## 2022-02-11 ENCOUNTER — Ambulatory Visit: Admission: RE | Admit: 2022-02-11 | Payer: Medicaid Other | Source: Ambulatory Visit | Admitting: General Surgery

## 2022-02-11 ENCOUNTER — Ambulatory Visit: Payer: Self-pay | Attending: Internal Medicine

## 2022-02-11 DIAGNOSIS — Z952 Presence of prosthetic heart valve: Secondary | ICD-10-CM

## 2022-02-11 DIAGNOSIS — Z7901 Long term (current) use of anticoagulants: Secondary | ICD-10-CM

## 2022-02-11 DIAGNOSIS — Z5181 Encounter for therapeutic drug level monitoring: Secondary | ICD-10-CM

## 2022-02-11 LAB — POCT INR: INR: 1.3 — AB (ref 2.0–3.0)

## 2022-02-11 SURGERY — CHOLECYSTECTOMY, ROBOT-ASSISTED, LAPAROSCOPIC
Anesthesia: General | Site: Abdomen

## 2022-02-11 NOTE — Patient Instructions (Signed)
TAKE 1 TABLET TODAY and 2 TABLETS THURSDAY ONLY and then CONTINUE 1 tablet every day EXCEPT 0.5 tablet on Monday, Wednesday and Friday - Recheck INR in 2 week

## 2022-02-18 ENCOUNTER — Ambulatory Visit: Payer: Self-pay | Attending: Internal Medicine | Admitting: Internal Medicine

## 2022-02-18 ENCOUNTER — Encounter: Payer: Self-pay | Admitting: Internal Medicine

## 2022-02-18 VITALS — BP 100/60 | HR 66 | Ht 68.0 in | Wt 173.4 lb

## 2022-02-18 DIAGNOSIS — I7100 Dissection of unspecified site of aorta: Secondary | ICD-10-CM

## 2022-02-18 DIAGNOSIS — I10 Essential (primary) hypertension: Secondary | ICD-10-CM | POA: Insufficient documentation

## 2022-02-18 DIAGNOSIS — E785 Hyperlipidemia, unspecified: Secondary | ICD-10-CM

## 2022-02-18 DIAGNOSIS — I480 Paroxysmal atrial fibrillation: Secondary | ICD-10-CM

## 2022-02-18 DIAGNOSIS — Z952 Presence of prosthetic heart valve: Secondary | ICD-10-CM

## 2022-02-18 NOTE — Patient Instructions (Signed)
Medication Instructions:   Your physician recommends that you continue on your current medications as directed. Please refer to the Current Medication list given to you today.  *If you need a refill on your cardiac medications before your next appointment, please call your pharmacy*   Lab Work:  None ordered  Testing/Procedures:  None ordered   Follow-Up: At [facility], you and your health needs are our priority.  As part of our continuing mission to provide you with exceptional heart care, we have created designated Provider Care Teams.  These Care Teams include your primary Cardiologist (physician) and Advanced Practice Providers (APPs -  Physician Assistants and Nurse Practitioners) who all work together to provide you with the care you need, when you need it.  We recommend signing up for the patient portal called "MyChart".  Sign up information is provided on this After Visit Summary.  MyChart is used to connect with patients for Virtual Visits (Telemedicine).  Patients are able to view lab/test results, encounter notes, upcoming appointments, etc.  Non-urgent messages can be sent to your provider as well.   To learn more about what you can do with MyChart, go to [email or web address].    Your next appointment:   6 month(s)  The format for your next appointment:   In Person  Provider:   You may see Christopher End, MD or one of the following Advanced Practice Providers on your designated Care Team:   Christopher Berge, NP Ryan Dunn, PA-C Cadence Furth, PA-C Sheri Hammock, NP    Important Information About Sugar       

## 2022-02-18 NOTE — Progress Notes (Signed)
Follow-up Outpatient Visit Date: 02/18/2022  Primary Care Provider: Larae Grooms, NP [address] [place] [address]  Chief Complaint: Follow-up aortic dissection and AVR  HPI:  Ryan [name] is a 56 y.o. 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  I last saw him in mid July following diagnosis of acute cholecystitis s/p percutaneous cholecystostomy tube placement.  He was feeling better at our visit, though he still noted some fullness and soreness in the right upper quadrant.  His chronic chest pain (2-3/10) dating back to the time of his AVR and root repair was unchanged.  Due to uptrending blood pressure since leaving the hospital, lisinopril 5 mg daily was restarted (had been held due to soft BP during preceding hospitalization).  He underwent successful robotic assisted laparoscopic cholecystectomy last month (02/01/2022) and is recovering well.  His abdominal pain has resolved other than some soreness around his incisions.  From a heart standpoint, Ryan [name] is doing well.  His chronic chest pain has been "very light."  He initially had some shortness of breath when taking a deep breath following his cholecystectomy, though this resolved after a few days.  He denies dyspnea at this time as well as palpitations, lightheadedness, and edema.  Due to his blood pressures being a bit low on lisinopril 5 mg daily, he has reduced this to 2.5 mg daily.  He has not had any bleeding or neurologic changes concerning for stroke (INRs have been subtherapeutic recently; he is scheduled for repeat INR through  the anticoagulation clinic next week).  --------------------------------------------------------------------------------------------------  Cardiovascular History & Procedures: Cardiovascular Problems: Aortic dissection status post root repair and mechanical AVR with residual chronic dissection extending from innominate artery through the aortic bifurcation Stroke Chronic left bundle branch block   Risk Factors: Stroke, hypertension, hyperlipidemia, and male gender   Cath/PCI: LHC (07/22/2020, [facility]): LMCA normal.  LAD normal.  LCx normal.  Dominant RCA normal.   CV Surgery: Aortic root repair and mechanical AVR (2001)   EP Procedures and Devices: None   Non-Invasive Evaluation(s): Pharmacologic MPI (12/12/2021): Abnormal, potentially high risk pharmacologic myocardial perfusion stress test.  There is a large, fixed defect involving the inferior and inferolateral walls most consistent with scar but cannot rule out an element of artifact.  No significant ischemia identified.  LVEF 50% by Siemens calculation.  Transient ischemic dilation noted (TID1.35). CTA chest (09/28/2021): Complex aortic dissection with aortic valve replacement.  Dilated aortic root measuring up to 5.1 cm, aortic arch measuring up to 4.6 cm and descending aorta measuring up to 5.4 cm.  Dissections extend into all great vessels except for the left common carotid artery.  Duplicated SVC noted. TTE (08/27/2021): Normal LV size with mild LVH.  LVEF 50-55% with grade 2 diastolic dysfunction.  Mildly dilated RV with normal contraction.  Mild right atrial enlargement.  Mild mitral regurgitation.  Mechanical aortic valve present with mean gradient 7 mmHg.  Normal CVP. CTA chest, abdomen, and pelvis (04/25/2021, [facility]): Patient status post AVR and ascending aortic graft.  Residual dissection in the arch to the level of the bilateral common iliac arteries.  Aortic root measures up to 5.0 cm.  Arch and  descending aorta  measure up to 4.8 cm.  Findings are stable from prior study in 01/2021. TTE (02/05/2021, [facility]): Technically difficult study.  Mildly dilated left ventricle with LVEF 55-60% with abnormal septal motion.  Moderate left atrial enlargement.  Normal mechanical AVR gradient (mean gradient 7 mmHg) with mild regurgitation.  Recent CV Pertinent Labs: Lab Results  Component Value Date   CHOL 81 (L) 09/29/2021   HDL 28 (L) 09/29/2021   LDLCALC 18 09/29/2021   TRIG 232 (H) 09/29/2021   CHOLHDL 2.9 09/29/2021   CHOLHDL 3.5 08/27/2021   INR 1.3 (A) 02/11/2022   INR 1.6 (H) 02/01/2022   BNP 50.8 01/02/2022   K 4.2 02/01/2022   MG 2.0 02/01/2022   BUN 12 02/01/2022   BUN 9 12/31/2021   CREATININE 1.01 02/01/2022    Past medical and surgical history were reviewed and updated in EPIC.  Current Meds  Medication Sig   acetaminophen (TYLENOL) 500 MG tablet Take 500 mg by mouth every 6 (six) hours as needed for headache, fever, moderate pain or mild pain.   aspirin 81 MG EC tablet Take 81 mg by mouth daily.   Ferrous Sulfate Dried (HIGH POTENCY IRON) 65 MG TABS Take 2 tablets by mouth daily.   lisinopril (ZESTRIL) 5 MG tablet Take 1 tablet (5 mg total) by mouth daily.   lovastatin (MEVACOR) 20 MG tablet Take 1 tablet (20 mg total) by mouth at bedtime.   oxyCODONE-acetaminophen (PERCOCET/ROXICET) 5-325 MG tablet Take 1 tablet by mouth every 4 (four) hours as needed for severe pain.   Potassium 99 MG TABS Take 2 tablets by mouth every evening.   warfarin (COUMADIN) 5 MG tablet Take 0.5-1 tablets (2.5-5 mg total) by mouth daily at 4 PM. Take 5 mg Monday, Weds, Sat. 2.5 mg on all other days    Allergies: Alitraq, Iodinated contrast media, and Iodine  Social History   Tobacco Use   Smoking status: Never    Passive exposure: Past   Smokeless tobacco: Never  Vaping Use   Vaping Use: Never used  Substance Use Topics   Alcohol use: Yes    Comment: 2 mixed drinks per year   Drug use: Never     Family History  Problem Relation Age of Onset   Heart attack Mother    Hypertension Mother    Hyperlipidemia Mother    Heart disease Mother    Heart attack Maternal Grandfather     Review of Systems: A 12-system review of systems was performed and was negative except as noted in the HPI.  --------------------------------------------------------------------------------------------------  Physical Exam: BP 100/60 (BP Location: Left Arm, Patient Position: Sitting, Cuff Size: Normal)   Pulse 66   Ht 5\' 8"  (1.727 m)   Wt 173 lb 6 oz (78.6 kg)   SpO2 99%   BMI 26.36 kg/m   General:  NAD. Neck: No JVD or HJR. Lungs: Clear to auscultation bilaterally without wheezes or crackles. Heart: Regular rate and rhythm with 2/6 systolic murmur. Abdomen: Soft, nontender, nondistended. Extremities: No lower extremity edema.  EKG: Normal sinus rhythm with left bundle branch block.  No significant change from prior tracing on 01/05/2022.  Lab Results  Component Value Date   WBC 4.3 02/01/2022   HGB 10.7 (L) 02/01/2022   HCT 33.2 (L) 02/01/2022   MCV 93.0 02/01/2022   PLT 122 (L) 02/01/2022    Lab Results  Component Value Date   NA 139 02/01/2022   K 4.2 02/01/2022   CL  110 02/01/2022   CO2 26 02/01/2022   BUN 12 02/01/2022   CREATININE 1.01 02/01/2022   GLUCOSE 86 02/01/2022   ALT 24 02/01/2022    Lab Results  Component Value Date   CHOL 81 (L) 09/29/2021   HDL 28 (L) 09/29/2021   LDLCALC 18 09/29/2021   TRIG 232 (H) 09/29/2021   CHOLHDL 2.9 09/29/2021    --------------------------------------------------------------------------------------------------  ASSESSMENT AND PLAN: Aortic dissection: No new symptoms reported.  Continue lipid and blood pressure control with current medications as well as long-term follow-up with Dr. Laneta Simmers (cardiac surgery).  History of mechanical aortic valve replacement: No evidence of valve dysfunction.  Continue aspirin and  warfarin.  Paroxysmal atrial fibrillation: No evidence of recurrent atrial fibrillation.  Given resting heart rate in the 60s is always borderline low blood pressure, we will defer adding an AV nodal blocking agent.  Continue long-term anticoagulation with warfarin per anticoagulation clinic in the setting of mechanical AVR.  Hypertension: Blood pressure low normal today.  I think it is reasonable to continue with reduced dose of lisinopril (2.5 mg daily).  Hyperlipidemia: Triglycerides slightly high but lipids otherwise well controlled on last check in April.  Continue low-dose lovastatin.  Follow-up: Return to clinic in 6 months.  Yvonne Kendall, MD 02/18/2022 9:24 AM

## 2022-02-25 ENCOUNTER — Ambulatory Visit: Payer: Medicaid Other

## 2022-03-04 ENCOUNTER — Ambulatory Visit: Payer: Self-pay | Attending: Cardiology

## 2022-03-04 DIAGNOSIS — Z952 Presence of prosthetic heart valve: Secondary | ICD-10-CM

## 2022-03-04 DIAGNOSIS — Z5181 Encounter for therapeutic drug level monitoring: Secondary | ICD-10-CM

## 2022-03-04 DIAGNOSIS — Z7901 Long term (current) use of anticoagulants: Secondary | ICD-10-CM

## 2022-03-04 LAB — POCT INR: INR: 5.2 — AB (ref 2.0–3.0)

## 2022-03-04 NOTE — Patient Instructions (Signed)
HOLD TODAY, THURSDAY and FRIDAY ONLY and then CONTINUE 1 tablet every day EXCEPT 0.5 tablet on Monday, Wednesday and Friday; EAT GREENS 1 DAY PER WEEK; - Recheck INR in 1 week

## 2022-03-11 ENCOUNTER — Ambulatory Visit: Payer: Self-pay | Attending: Internal Medicine

## 2022-03-11 DIAGNOSIS — Z5181 Encounter for therapeutic drug level monitoring: Secondary | ICD-10-CM

## 2022-03-11 DIAGNOSIS — Z7901 Long term (current) use of anticoagulants: Secondary | ICD-10-CM

## 2022-03-11 DIAGNOSIS — Z952 Presence of prosthetic heart valve: Secondary | ICD-10-CM

## 2022-03-11 LAB — POCT INR: INR: 2.5 (ref 2.0–3.0)

## 2022-03-11 NOTE — Patient Instructions (Signed)
DECREASE 0.5 tablet every day EXCEPT 1 tablet on Monday, Wednesday and Friday; EAT GREENS 1 DAY PER WEEK; - Recheck INR in 3 weeks

## 2022-03-12 ENCOUNTER — Other Ambulatory Visit: Payer: Self-pay

## 2022-03-12 ENCOUNTER — Telehealth: Payer: Self-pay | Admitting: Internal Medicine

## 2022-03-12 MED ORDER — WARFARIN SODIUM 5 MG PO TABS: 2.5000 mg | ORAL_TABLET | Freq: Every day | ORAL | 0 refills | Status: DC

## 2022-03-12 NOTE — Telephone Encounter (Signed)
*  STAT* If patient is at the pharmacy, call can be transferred to refill team.   1. Which medications need to be refilled? (please list name of each medication and dose if known) warfarin (COUMADIN) 5 MG tablet  2. Which pharmacy/location (including street and city if local pharmacy) is medication to be sent to? [place], [place]  3. Do they need a 30 day or 90 day supply? [place]

## 2022-03-19 ENCOUNTER — Ambulatory Visit: Payer: Self-pay

## 2022-03-19 NOTE — Telephone Encounter (Signed)
Called patient to offer an appointment with Junie Panning but patient is already at the [facility]

## 2022-03-19 NOTE — Telephone Encounter (Signed)
  Chief Complaint: wheezing in am  Symptoms: moderate SOB, can talk in full sentences, cough Frequency: 1 week Pertinent Negatives: Patient denies fever, dizziness, runny nose, chest pain  Disposition: [] ED /[x] Urgent Care (no appt availability in office) / [] Appointment(In office/virtual)/ []  [place] [facility]/ [] Home Care/ [] Refused Recommended Disposition /[] [place] [facility]/ []  Follow-up with PCP Additional Notes: pt refused ED- advised to go to UC for wheezing.  Reason for Disposition  [1] MODERATE difficulty breathing (e.g., speaks in phrases, SOB even at rest, pulse 100-120) AND [2] NEW-onset or WORSE than normal  Answer Assessment - Initial Assessment Questions 1. RESPIRATORY STATUS: "Describe your breathing?" (e.g., wheezing, shortness of breath, unable to speak, severe coughing)      wheezing 2. ONSET: "When did this breathing problem begin?"      1 week in am  3. PATTERN "Does the difficult breathing come and go, or has it been constant since it started?"      Comes and goes 4. SEVERITY: "How bad is your breathing?" (e.g., mild, moderate, severe)    - MILD: No SOB at rest, mild SOB with walking, speaks normally in sentences, can lie down, no retractions, pulse < 100.    - MODERATE: SOB at rest, SOB with minimal exertion and prefers to sit, cannot lie down flat, speaks in phrases, mild retractions, audible wheezing, pulse 100-120.    - SEVERE: Very SOB at rest, speaks in single words, struggling to breathe, sitting hunched forward, retractions, pulse > 120      SOB (moderate) 5. RECURRENT SYMPTOM: "Have you had difficulty breathing before?" If Yes, ask: "When was the last time?" and "What happened that time?"      When heart attack 6. CARDIAC HISTORY: "Do you have any history of heart disease?" (e.g., heart attack, angina, bypass surgery, angioplasty)      Heart attack, aortic artificial valve afib 7. LUNG HISTORY: "Do you have any history of lung disease?"  (e.g.,  pulmonary embolus, asthma, emphysema)     OSA 8. CAUSE: "What do you think is causing the breathing problem?"      unsure 9. OTHER SYMPTOMS: "Do you have any other symptoms? (e.g., dizziness, runny nose, cough, chest pain, fever)     Productive cough with clear  10. O2 SATURATION MONITOR:  "Do you use an oxygen saturation monitor (pulse oximeter) at home?" If Yes, ask: "What is your reading (oxygen level) today?" "What is your usual oxygen saturation reading?" (e.g., 95%)       N/a 11. PREGNANCY: "Is there any chance you are pregnant?" "When was your last menstrual period?"       N/a 12. TRAVEL: "Have you traveled out of the country in the last month?" (e.g., travel history, exposures)       N/a  Protocols used: Breathing Difficulty-A-AH

## 2022-03-30 ENCOUNTER — Telehealth: Payer: Self-pay

## 2022-03-30 NOTE — Telephone Encounter (Signed)
Transition Care Management Follow-up Telephone Call Date of discharge and from where: 03/29/22, [facility] Emergency Dept. ([place]) How have you been since you were released from the hospital? "Fine, still in some pain." Any questions or concerns? No  Items Reviewed: Did the pt receive and understand the discharge instructions provided? Yes  Medications obtained and verified? Yes  Other? No  Any new allergies since your discharge? No  Dietary orders reviewed? Yes Do you have support at home? Yes   Home Care and Equipment/Supplies: Were home health services ordered? not applicable If so, what is the name of the agency? N/A  Has the agency set up a time to come to the patient's home? not applicable Were any new equipment or medical supplies ordered?  No What is the name of the medical supply agency? N/A Were you able to get the supplies/equipment? not applicable Do you have any questions related to the use of the equipment or supplies? No  Functional Questionnaire: (I = Independent and D = Dependent) ADLs: I  Bathing/Dressing- I  Meal Prep- I  Eating- I  Maintaining continence- I  Transferring/Ambulation- I  Managing Meds- I  Follow up appointments reviewed:  PCP Hospital f/u appt confirmed? No . Patient states he does not want or need to be seen for this.  [facility] f/u appt confirmed? No  Are transportation arrangements needed? No  If their condition worsens, is the pt aware to call PCP or go to the Emergency Dept.? Yes Was the patient provided with contact information for the PCP's office or ED? Yes Was to pt encouraged to call back with questions or concerns? Yes

## 2022-04-01 ENCOUNTER — Ambulatory Visit: Payer: Self-pay | Attending: Internal Medicine

## 2022-04-01 DIAGNOSIS — Z5181 Encounter for therapeutic drug level monitoring: Secondary | ICD-10-CM

## 2022-04-01 DIAGNOSIS — Z952 Presence of prosthetic heart valve: Secondary | ICD-10-CM

## 2022-04-01 DIAGNOSIS — Z7901 Long term (current) use of anticoagulants: Secondary | ICD-10-CM

## 2022-04-01 LAB — POCT INR: INR: 2.8 (ref 2.0–3.0)

## 2022-04-01 NOTE — Patient Instructions (Signed)
CONTINUE 0.5 tablet every day EXCEPT 1 tablet on Monday, Wednesday and Friday; EAT GREENS 1 DAY PER WEEK; [phone] - Recheck INR in 4 weeks

## 2022-04-15 ENCOUNTER — Other Ambulatory Visit: Payer: Self-pay | Admitting: Nurse Practitioner

## 2022-04-15 NOTE — Telephone Encounter (Signed)
Requested Prescriptions  Pending Prescriptions Disp Refills  . lovastatin (MEVACOR) 20 MG tablet [Pharmacy Med Name: Lovastatin 20 MG Oral Tablet] 90 tablet 0    Sig: TAKE 1 TABLET BY MOUTH AT BEDTIME     Cardiovascular:  Antilipid - Statins 2 Failed - 04/15/2022  9:28 AM      Failed - Lipid Panel in normal range within the last 12 months    Cholesterol, Total  Date Value Ref Range Status  09/29/2021 81 (L) 100 - 199 mg/dL Final   LDL Chol Calc (NIH)  Date Value Ref Range Status  09/29/2021 18 0 - 99 mg/dL Final   HDL  Date Value Ref Range Status  09/29/2021 28 (L) >39 mg/dL Final   Triglycerides  Date Value Ref Range Status  09/29/2021 232 (H) 0 - 149 mg/dL Final         Passed - Cr in normal range and within 360 days    Creatinine, Ser  Date Value Ref Range Status  02/01/2022 1.01 0.61 - 1.24 mg/dL Final         Passed - Patient is not pregnant      Passed - Valid encounter within last 12 months    Recent Outpatient Visits          3 months ago Hospital discharge follow-up   [facility] Jon Billings, NP   3 months ago RUQ pain   [facility] Jon Billings, NP   6 months ago Paroxysmal atrial fibrillation [facility])   [place], NP      Future Appointments            In 4 months End, Harrell Gave, MD [facility] [place]. [place]

## 2022-04-27 ENCOUNTER — Other Ambulatory Visit: Payer: Self-pay | Admitting: Internal Medicine

## 2022-04-28 ENCOUNTER — Telehealth: Payer: Self-pay | Admitting: Internal Medicine

## 2022-04-28 MED ORDER — LISINOPRIL 5 MG PO TABS: 5.0000 mg | ORAL_TABLET | Freq: Every day | ORAL | 2 refills | Status: DC

## 2022-04-28 NOTE — Telephone Encounter (Signed)
*  STAT* If patient is at the pharmacy, call can be transferred to refill team.   1. Which medications need to be refilled? (please list name of each medication and dose if known)   lisinopril (ZESTRIL) 5 MG tablet   2. Which pharmacy/location (including street and city if local pharmacy) is medication to be sent to?  [facility] [address] [place] - [address]   3. Do they need a 30 day or 90 day supply? 30 day  Patient stated he only has 2 tablets left.

## 2022-04-29 ENCOUNTER — Ambulatory Visit: Payer: Medicaid Other | Attending: Internal Medicine

## 2022-04-29 DIAGNOSIS — Z7901 Long term (current) use of anticoagulants: Secondary | ICD-10-CM

## 2022-04-29 DIAGNOSIS — Z5181 Encounter for therapeutic drug level monitoring: Secondary | ICD-10-CM

## 2022-04-29 DIAGNOSIS — Z952 Presence of prosthetic heart valve: Secondary | ICD-10-CM

## 2022-04-29 LAB — POCT INR: INR: 4.1 — AB (ref 2.0–3.0)

## 2022-04-29 NOTE — Patient Instructions (Signed)
HOLD TODAY ONLY then CONTINUE 0.5 tablet every day EXCEPT 1 tablet on Monday, Wednesday and Friday; EAT GREENS 1 DAY PER WEEK; [phone] - Recheck INR in 3 weeks

## 2022-04-30 MED ORDER — LISINOPRIL 5 MG PO TABS: 5.0000 mg | ORAL_TABLET | Freq: Every day | ORAL | 2 refills | Status: DC

## 2022-04-30 NOTE — Telephone Encounter (Signed)
Patient is following up on refill request. He states he is now completely out of medication.

## 2022-04-30 NOTE — Addendum Note (Signed)
Addended by: Auburn Bilberry D on: 04/30/2022 01:25 PM   Modules accepted: Orders

## 2022-05-20 ENCOUNTER — Ambulatory Visit: Payer: Medicaid Other | Attending: Internal Medicine

## 2022-05-20 DIAGNOSIS — Z952 Presence of prosthetic heart valve: Secondary | ICD-10-CM | POA: Diagnosis not present

## 2022-05-20 DIAGNOSIS — Z7901 Long term (current) use of anticoagulants: Secondary | ICD-10-CM | POA: Diagnosis not present

## 2022-05-20 DIAGNOSIS — Z5181 Encounter for therapeutic drug level monitoring: Secondary | ICD-10-CM

## 2022-05-20 LAB — POCT INR: INR: 3.2 — AB (ref 2.0–3.0)

## 2022-05-20 NOTE — Patient Instructions (Signed)
CONTINUE 0.5 tablet every day EXCEPT 1 tablet on Monday, Wednesday and Friday; EAT GREENS 1 DAY PER WEEK; [phone] - Recheck INR in 4 weeks

## 2022-05-25 ENCOUNTER — Emergency Department
Admission: EM | Admit: 2022-05-25 | Discharge: 2022-05-25 | Disposition: A | Discharge status: Home / Self Care | Payer: Medicaid Other | Attending: Emergency Medicine | Admitting: Emergency Medicine

## 2022-05-25 ENCOUNTER — Other Ambulatory Visit: Payer: Self-pay

## 2022-05-25 ENCOUNTER — Emergency Department: Payer: Medicaid Other

## 2022-05-25 DIAGNOSIS — R202 Paresthesia of skin: Secondary | ICD-10-CM | POA: Diagnosis not present

## 2022-05-25 DIAGNOSIS — R791 Abnormal coagulation profile: Secondary | ICD-10-CM | POA: Insufficient documentation

## 2022-05-25 DIAGNOSIS — R209 Unspecified disturbances of skin sensation: Secondary | ICD-10-CM | POA: Diagnosis not present

## 2022-05-25 DIAGNOSIS — I10 Essential (primary) hypertension: Secondary | ICD-10-CM | POA: Diagnosis not present

## 2022-05-25 DIAGNOSIS — I509 Heart failure, unspecified: Secondary | ICD-10-CM | POA: Insufficient documentation

## 2022-05-25 DIAGNOSIS — R200 Anesthesia of skin: Secondary | ICD-10-CM | POA: Diagnosis not present

## 2022-05-25 DIAGNOSIS — I4891 Unspecified atrial fibrillation: Secondary | ICD-10-CM | POA: Insufficient documentation

## 2022-05-25 DIAGNOSIS — R001 Bradycardia, unspecified: Secondary | ICD-10-CM | POA: Diagnosis not present

## 2022-05-25 LAB — COMPREHENSIVE METABOLIC PANEL
ALT: 19 U/L (ref 0–44)
AST: 25 U/L (ref 15–41)
Albumin: 3.9 g/dL (ref 3.5–5.0)
Alkaline Phosphatase: 58 U/L (ref 38–126)
Anion gap: 6 (ref 5–15)
BUN: 11 mg/dL (ref 6–20)
CO2: 26 mmol/L (ref 22–32)
Calcium: 8.8 mg/dL — ABNORMAL LOW (ref 8.9–10.3)
Chloride: 107 mmol/L (ref 98–111)
Creatinine, Ser: 1.03 mg/dL (ref 0.61–1.24)
GFR, Estimated: >60 mL/min (ref >60)
Glucose, Bld: 109 mg/dL — ABNORMAL HIGH (ref 70–99)
Potassium: 4.2 mmol/L (ref 3.5–5.1)
Sodium: 139 mmol/L (ref 135–145)
Total Bilirubin: 0.7 mg/dL (ref 0.3–1.2)
Total Protein: 6.8 g/dL (ref 6.5–8.1)

## 2022-05-25 LAB — DIFFERENTIAL
Abs Immature Granulocytes: 0.03 10*3/uL (ref 0.00–0.07)
Basophils Absolute: 0 10*3/uL (ref 0.0–0.1)
Basophils Relative: 0 %
Eosinophils Absolute: 0.2 10*3/uL (ref 0.0–0.5)
Eosinophils Relative: 3 %
Immature Granulocytes: 1 %
Lymphocytes Relative: 24 %
Lymphs Abs: 1.2 10*3/uL (ref 0.7–4.0)
Monocytes Absolute: 0.3 10*3/uL (ref 0.1–1.0)
Monocytes Relative: 6 %
Neutro Abs: 3.3 10*3/uL (ref 1.7–7.7)
Neutrophils Relative %: 66 %

## 2022-05-25 LAB — CBC
HCT: 41.7 % (ref 39.0–52.0)
Hemoglobin: 13.5 g/dL (ref 13.0–17.0)
MCH: 30.5 pg (ref 26.0–34.0)
MCHC: 32.4 g/dL (ref 30.0–36.0)
MCV: 94.1 fL (ref 80.0–100.0)
Platelets: 148 10*3/uL — ABNORMAL LOW (ref 150–400)
RBC: 4.43 MIL/uL (ref 4.22–5.81)
RDW: 14.9 % (ref 11.5–15.5)
WBC: 5 10*3/uL (ref 4.0–10.5)
nRBC: 0 % (ref 0.0–0.2)

## 2022-05-25 LAB — ETHANOL: Alcohol, Ethyl (B): <10 mg/dL (ref <10)

## 2022-05-25 LAB — APTT: aPTT: 36 seconds (ref 24–36)

## 2022-05-25 LAB — CBG MONITORING, ED: Glucose-Capillary: 90 mg/dL (ref 70–99)

## 2022-05-25 LAB — PROTIME-INR
INR: 2.4 — ABNORMAL HIGH (ref 0.8–1.2)
Prothrombin Time: 26.1 seconds — ABNORMAL HIGH (ref 11.4–15.2)

## 2022-05-25 MED ORDER — SODIUM CHLORIDE 0.9% FLUSH: 3.0000 mL | Freq: Once | INTRAVENOUS | Status: DC

## 2022-05-25 NOTE — ED Provider Triage Note (Signed)
Emergency Medicine Provider Triage Evaluation Note  Ryan [name] , a 56 y.o. male  was evaluated in triage.  Pt complains of numbness and tingling along the left side of his head, left arm, left leg, started about 130 this morning.  Review of Systems  Positive:  Negative:   Physical Exam  There were no vitals taken for this visit. Gen:   Awake, no distress   Resp:  Normal effort  MSK:   Moves extremities without difficulty  Other:    Medical Decision Making  Medically screening exam initiated at 12:15 PM.  Appropriate orders placed.  Adelfa Koh was informed that the remainder of the evaluation will be completed by another provider, this initial triage assessment does not replace that evaluation, and the importance of remaining in the ED until their evaluation is complete.     Faythe Ghee, PA-C 05/25/22 1215

## 2022-05-25 NOTE — ED Provider Notes (Signed)
MRI is negative for acute stroke.  Will discharge the patient with follow-up with primary care and neurology as planned.  He will return if worse.   Arnaldo Natal, MD 05/25/22 [phone]

## 2022-05-25 NOTE — Discharge Instructions (Signed)
The MRI did not show any new strokes.  Please follow-up with your primary care provider and with neurology for your numbness.  Please return for any further problems.  I have included the contact information for Dr. Malvin Johns and Dr. Sherryll Burger the 2 neurologists in town.  If you give the office a call they should be able to work you in.

## 2022-05-25 NOTE — ED Provider Notes (Signed)
[facility] Provider Note    Event Date/Time   First MD Initiated Contact with Patient 05/25/22 1315     (approximate)   History   Chief Complaint Numbness   HPI  Ryan [name] is a 56 y.o. male with past medical history of aortic dissection, mechanical aortic valve, stroke, atrial fibrillation, hypertension, hyperlipidemia, CHF, and anemia who presents to the ED complaining of numbness.  Patient reports that he went to sleep around 1 AM last night, subsequently woke up around 3 AM with numbness across the entire left side of his body.  He denies any associated weakness and has not had any vision changes, speech changes, or facial droop.  Numbness has remained constant since he woke up, eventually decided to seek care this afternoon.  He denies any associated chest pain or shortness of breath, has otherwise been feeling well with no fever or cough.      Physical Exam   Triage Vital Signs: ED Triage Vitals  Enc Vitals Group     BP 05/25/22 1215 130/61     Pulse Rate 05/25/22 1215 (!) 50     Resp 05/25/22 1215 16     Temp 05/25/22 1215 97.9 F (36.6 C)     Temp Source 05/25/22 1215 Oral     SpO2 05/25/22 1215 94 %     Weight 05/25/22 1219 175 lb (79.4 kg)     Height 05/25/22 1219 5\' 8"  (1.727 m)     Head Circumference --      Peak Flow --      Pain Score 05/25/22 1219 0     Pain Loc --      Pain Edu? --      Excl. in GC? --     Most recent vital signs: Vitals:   05/25/22 1345 05/25/22 1430  BP: 124/75 135/75  Pulse: (!) 58 (!) 52  Resp:  15  Temp:    SpO2: 96% 96%    Constitutional: Alert and oriented. Eyes: Conjunctivae are normal. Head: Atraumatic. Nose: No congestion/rhinnorhea. Mouth/Throat: Mucous membranes are moist.  Cardiovascular: Normal rate, regular rhythm. Grossly normal heart sounds.  2+ radial pulses bilaterally. Respiratory: Normal respiratory effort.  No retractions. Lungs CTAB. Gastrointestinal: Soft and nontender. No  distention. Musculoskeletal: No lower extremity tenderness nor edema.  Neurologic:  Normal speech and language. No gross focal neurologic deficits are appreciated.    ED Results / Procedures / Treatments   Labs (all labs ordered are listed, but only abnormal results are displayed) Labs Reviewed  PROTIME-INR - Abnormal; Notable for the following components:      Result Value   Prothrombin Time 26.1 (*)    INR 2.4 (*)    All other components within normal limits  CBC - Abnormal; Notable for the following components:   Platelets 148 (*)    All other components within normal limits  COMPREHENSIVE METABOLIC PANEL - Abnormal; Notable for the following components:   Glucose, Bld 109 (*)    Calcium 8.8 (*)    All other components within normal limits  APTT  DIFFERENTIAL  ETHANOL  CBG MONITORING, ED     EKG  ED ECG REPORT I, 14/11/23, the attending physician, personally viewed and interpreted this ECG.   Date: 05/25/2022  EKG Time: 12:33  Rate: 52  Rhythm: sinus bradycardia  Axis: Normal  Intervals:left bundle branch block  ST&T Change: None  RADIOLOGY CT head reviewed and interpreted by me with no hemorrhage or  midline shift.  PROCEDURES:  Critical Care performed: No  Procedures   MEDICATIONS ORDERED IN ED: Medications  sodium chloride flush (NS) 0.9 % injection 3 mL (3 mLs Intravenous Not Given 05/25/22 1327)     IMPRESSION / MDM / ASSESSMENT AND PLAN / ED COURSE  I reviewed the triage vital signs and the nursing notes.                              56 y.o. male with past medical history of aortic dissection, mechanical aortic valve on Coumadin, atrial fibrillation, and stroke who presents to the ED for numbness across the left side of his body that he noticed since waking up at 3:00 this morning.  Patient's presentation is most consistent with acute presentation with potential threat to life or bodily function.  Differential diagnosis includes, but  is not limited to, stroke, TIA, electrolyte abnormality, anemia, intracranial hemorrhage.  Patient nontoxic-appearing and in no acute distress, vital signs remarkable for bradycardia but otherwise reassuring.  EKG shows sinus bradycardia with left bundle branch block similar to previous.  Labs are reassuring with no significant anemia, leukocytosis, electrode abnormality, or AKI.  INR is slightly subtherapeutic at 2.4, which patient was informed of and counseled to follow-up for recheck later this week.  Patient is at high risk for stroke given his slightly subtherapeutic INR and we will further assess with MRI brain.  Patient turned over to oncoming provider pending MRI results, if these are negative he would be appropriate for discharge home with outpatient follow-up.      FINAL CLINICAL IMPRESSION(S) / ED DIAGNOSES   Final diagnoses:  Numbness     Rx / DC Orders   ED Discharge Orders     None        Note:  This document was prepared using Dragon voice recognition software and may include unintentional dictation errors.   Chesley Noon, MD 05/25/22 1623

## 2022-05-25 NOTE — ED Triage Notes (Signed)
Pt to ED via POV for numbness and tingling on the left side of his body. Pt states that he woke up at 0300 with the symptoms. Pt was normal when he went to bed at 0130. Pt denies dizziness or headache. Pt denies slurred speech or changes in his vision. Pt has extensive medical history.

## 2022-06-07 ENCOUNTER — Other Ambulatory Visit: Payer: Self-pay | Admitting: Internal Medicine

## 2022-06-09 NOTE — Telephone Encounter (Signed)
Refill request

## 2022-06-17 ENCOUNTER — Ambulatory Visit: Payer: Medicaid Other | Attending: Internal Medicine

## 2022-06-17 DIAGNOSIS — Z952 Presence of prosthetic heart valve: Secondary | ICD-10-CM | POA: Diagnosis not present

## 2022-06-17 DIAGNOSIS — Z7901 Long term (current) use of anticoagulants: Secondary | ICD-10-CM | POA: Diagnosis not present

## 2022-06-17 DIAGNOSIS — Z5181 Encounter for therapeutic drug level monitoring: Secondary | ICD-10-CM

## 2022-06-17 LAB — POCT INR: INR: 2.5 (ref 2.0–3.0)

## 2022-06-17 NOTE — Patient Instructions (Signed)
CONTINUE 0.5 tablet every day EXCEPT 1 tablet on Monday, Wednesday and Friday; EAT GREENS 1 DAY PER WEEK; [phone] - Recheck INR in 6 weeks

## 2022-06-22 ENCOUNTER — Emergency Department
Admission: EM | Admit: 2022-06-22 | Discharge: 2022-06-22 | Disposition: A | Discharge status: Home / Self Care | Payer: Medicaid Other | Attending: Emergency Medicine | Admitting: Emergency Medicine

## 2022-06-22 ENCOUNTER — Other Ambulatory Visit: Payer: Self-pay

## 2022-06-22 DIAGNOSIS — R040 Epistaxis: Secondary | ICD-10-CM | POA: Diagnosis not present

## 2022-06-22 DIAGNOSIS — Z7901 Long term (current) use of anticoagulants: Secondary | ICD-10-CM | POA: Insufficient documentation

## 2022-06-22 DIAGNOSIS — I10 Essential (primary) hypertension: Secondary | ICD-10-CM | POA: Insufficient documentation

## 2022-06-22 DIAGNOSIS — R58 Hemorrhage, not elsewhere classified: Secondary | ICD-10-CM | POA: Diagnosis not present

## 2022-06-22 MED ORDER — SILVER NITRATE-POT NITRATE 75-25 % EX MISC
1.0000 | Freq: Once | CUTANEOUS | Status: AC
  Administered 2022-06-22: 1 via TOPICAL
  Filled 2022-06-22: qty 10

## 2022-06-22 MED ORDER — AMOXICILLIN-POT CLAVULANATE 875-125 MG PO TABS: 1.0000 | ORAL_TABLET | Freq: Two times a day (BID) | ORAL | 0 refills | Status: AC

## 2022-06-22 MED ORDER — TRANEXAMIC ACID FOR EPISTAXIS
500.0000 mg | Freq: Once | TOPICAL | Status: AC
  Administered 2022-06-22: 500 mg via TOPICAL
  Filled 2022-06-22: qty 10

## 2022-06-22 MED ORDER — OXYMETAZOLINE HCL 0.05 % NA SOLN
1.0000 | Freq: Once | NASAL | Status: AC
  Administered 2022-06-22: 1 via NASAL
  Filled 2022-06-22: qty 30

## 2022-06-22 NOTE — ED Notes (Signed)
Nose clamp applied. Bleeding controlled with pressure

## 2022-06-22 NOTE — ED Provider Notes (Signed)
[facility] Provider Note   Event Date/Time   First MD Initiated Contact with Patient 06/22/22 1700     (approximate) History  Epistaxis  HPI Ryan [name] is a 57 y.o. male on warfarin with last INR 2.4 who presents for epistaxis that began just prior to arrival and has been continuous since onset.  Patient states that he got a clamp put on in triage but seems to be bleeding through it.  Patient states they did not place any medication in his nose prior to placing this clamp. ROS: Patient currently denies any lightheadedness, vision changes, tinnitus, difficulty speaking, facial droop, sore throat, chest pain, shortness of breath, abdominal pain, nausea/vomiting/diarrhea, dysuria, or weakness/numbness/paresthesias in any extremity   Physical Exam  Triage Vital Signs: ED Triage Vitals  Enc Vitals Group     BP 06/22/22 1239 (!) 145/80     Pulse Rate 06/22/22 1239 65     Resp 06/22/22 1239 18     Temp 06/22/22 1239 98.2 F (36.8 C)     Temp src --      SpO2 06/22/22 1239 96 %     Weight --      Height --      Head Circumference --      Peak Flow --      Pain Score 06/22/22 1225 0     Pain Loc --      Pain Edu? --      Excl. in GC? --    Most recent vital signs: Vitals:   06/22/22 1239 06/22/22 1942  BP: (!) 145/80 126/74  Pulse: 65 (!) 51  Resp: 18 18  Temp: 98.2 F (36.8 C) 98.4 F (36.9 C)  SpO2: 96% 99%   General: Awake, oriented x4. CV:  Good peripheral perfusion.  Resp:  Normal effort.  Abd:  No distention.  Other:  Middle-aged overweight Caucasian male sitting in recliner in no acute distress with nasal clip in place.  Upon inspection of the left nare I cannot appreciate any areas of active bleeding ED Results / Procedures / Treatments  PROCEDURES: Critical Care performed: No Procedures MEDICATIONS ORDERED IN ED: Medications  oxymetazoline (AFRIN) 0.05 % nasal spray 1 spray (1 spray Each Nare Given 06/22/22 1750)  silver nitrate  applicators applicator 1 Stick (1 Stick Topical Given 06/22/22 1830)  tranexamic acid (CYKLOKAPRON) 1000 MG/10ML topical solution 500 mg (500 mg Topical Given 06/22/22 1901)   IMPRESSION / MDM / ASSESSMENT AND PLAN / ED COURSE  I reviewed the triage vital signs and the nursing notes.                             The patient is on the cardiac monitor to evaluate for evidence of arrhythmia and/or significant heart rate changes. Patient's presentation is most consistent with acute presentation with potential threat to life or bodily function. The bleeding source appears to be anterior in origin. Blood clots were cleared by having the patient blow them out into a towel. After this, a nasal clamp was applied for about 10 minutes. Upon removing the nasal clamp, the bleeding persisted.  As there was nothing that stuck out as far as source of bleeding for this epistaxis, a TXA soaked Rhino Rocket was placed with instructions to follow-up with ENT as well as Augmentin prescription.  Patient was hemostatic upon discharge.  Dispo: Discharge home with ENT follow-up   FINAL CLINICAL IMPRESSION(S) /  ED DIAGNOSES   Final diagnoses:  Left-sided epistaxis   Rx / DC Orders   ED Discharge Orders          Ordered    amoxicillin-clavulanate (AUGMENTIN) 875-125 MG tablet  2 times daily        06/22/22 2000           Note:  This document was prepared using Dragon voice recognition software and may include unintentional dictation errors.   Naaman Plummer, MD 06/22/22 [phone]

## 2022-06-22 NOTE — ED Triage Notes (Signed)
Pt comes via EMs from home with c/o nose bleed that started hour ago. Pt is on warfarin. Pt has hx of HTN as well  Current BP-130/80  Bleeding controlled at this time with holding pressure.

## 2022-06-25 ENCOUNTER — Telehealth: Payer: Self-pay

## 2022-06-25 NOTE — Telephone Encounter (Signed)
Transition Care Management Follow-up Telephone Call Date of discharge and from where: 06/22/22, [facility] How have you been since you were released from the hospital? Better Any questions or concerns? No  Items Reviewed: Did the pt receive and understand the discharge instructions provided? Yes  Medications obtained and verified? Yes  Other? No  Any new allergies since your discharge? No  Dietary orders reviewed? Yes Do you have support at home? Yes   Home Care and Equipment/Supplies: Were home health services ordered? no If so, what is the name of the agency? N/A  Has the agency set up a time to come to the patient's home? not applicable Were any new equipment or medical supplies ordered?  No What is the name of the medical supply agency? N/A Were you able to get the supplies/equipment? not applicable Do you have any questions related to the use of the equipment or supplies? No  Functional Questionnaire: (I = Independent and D = Dependent) ADLs: I  Bathing/Dressing- I  Meal Prep- I  Eating- I  Maintaining continence- I  Transferring/Ambulation- I  Managing Meds- I  Follow up appointments reviewed:  PCP Hospital f/u appt confirmed? No   Specialist Hospital f/u appt confirmed? No  Are transportation arrangements needed? No  If their condition worsens, is the pt aware to call PCP or go to the Emergency Dept.? Yes Was the patient provided with contact information for the PCP's office or ED? Yes Was to pt encouraged to call back with questions or concerns? Yes

## 2022-07-16 ENCOUNTER — Encounter: Payer: Self-pay | Admitting: Physician Assistant

## 2022-07-16 ENCOUNTER — Ambulatory Visit
Admission: RE | Admit: 2022-07-16 | Discharge: 2022-07-16 | Disposition: A | Discharge status: Home / Self Care | Payer: Medicaid Other | Source: Ambulatory Visit | Attending: Physician Assistant | Admitting: Physician Assistant

## 2022-07-16 ENCOUNTER — Ambulatory Visit (INDEPENDENT_AMBULATORY_CARE_PROVIDER_SITE_OTHER): Payer: Medicaid Other | Admitting: Physician Assistant

## 2022-07-16 VITALS — BP 112/59 | HR 50 | Temp 97.6°F | Wt 184.2 lb

## 2022-07-16 DIAGNOSIS — M25511 Pain in right shoulder: Secondary | ICD-10-CM

## 2022-07-16 NOTE — Progress Notes (Signed)
Your shoulder xray did not show signs of acute issues. There was evidence of some degenerative changes, likely arthritis, but no evidence of further bony abnormality. I'd recommend continuing with the measures we discussed today during your apt. If the pain and range of motion is not improving after a few weeks, we will refer you to Orthopedics and PT for further evaluation.

## 2022-07-16 NOTE — Patient Instructions (Addendum)
You can take the Tylenol for pain You can use Voltaren gel on the left shoulder to help with pain Warm compresses to the area- 20 minutes on, 30 minutes off  Please try to do the included exercises as best you can. They should not be excruciatingly painful- mild soreness is the most you should feel  If you are still having pain with these measures- we may need to refer you to Orthopedics     Please go here for your  xray   [place], [place] [address]

## 2022-07-16 NOTE — Progress Notes (Signed)
Acute Office Visit   Patient: Ryan [name]   DOB: 17-Aug-1965   57 y.o. Male  MRN: [id number] Visit Date: 07/16/2022  Today's healthcare provider: Dani Gobble Calbert Hulsebus, PA-C  Introduced myself to the patient as a Journalist, newspaper and provided education on APPs in clinical practice.    Chief Complaint  Patient presents with   Shoulder Pain    Pt states he has been having R shoulder pain for the last month or month and a half. States he lifted a box at work and put it on his shoulder. States ever since, he has not been able to move his shoulder without feeling pain.    Subjective    Shoulder Pain    HPI     Shoulder Pain    Additional comments: Pt states he has been having R shoulder pain for the last month or month and a half. States he lifted a box at work and put it on his shoulder. States ever since, he has not been able to move his shoulder without feeling pain.       Last edited by Georgina Peer, CMA on 07/16/2022  9:43 AM.       Right shoulder Pain  Onset: sudden  Duration: about a month  Location: right shoulder, along acromioclavicular line and into deltoid muscle  Pain level and character: 10/10 feels like the bones are rubbing together  Associated symptoms: denies numbness, tingling or tremors  Interventions: tylenol does not help much  Alleviating: hot water in shower  Aggravating: abduction   Reports he carried a box of frozen french fries on his shoulder at work - there was a small bruise later that day after he carried it but then pain started and has been getting worse   He cannot take NSAIDs   Medications: Outpatient Medications Prior to Visit  Medication Sig   acetaminophen (TYLENOL) 500 MG tablet Take 500 mg by mouth every 6 (six) hours as needed for headache, fever, moderate pain or mild pain.   aspirin 81 MG EC tablet Take 81 mg by mouth daily.   Ferrous Sulfate Dried (HIGH POTENCY IRON) 65 MG TABS Take 2 tablets by mouth daily.   lisinopril (ZESTRIL) 5  MG tablet Take 1 tablet (5 mg total) by mouth daily.   lovastatin (MEVACOR) 20 MG tablet TAKE 1 TABLET BY MOUTH AT BEDTIME   Potassium 99 MG TABS Take 2 tablets by mouth every evening.   warfarin (COUMADIN) 5 MG tablet TAKE 1 TABLET BY MOUTH  ON  MONDAY,WEDNESDAY  AND  SATURDAY  AND TAKE  ONE-HALF TABLET  ON  ALL  OTHER  DAYS   [DISCONTINUED] oxyCODONE-acetaminophen (PERCOCET/ROXICET) 5-325 MG tablet Take 1 tablet by mouth every 4 (four) hours as needed for severe pain.   No facility-administered medications prior to visit.    Review of Systems  Musculoskeletal:  Positive for arthralgias and myalgias. Negative for joint swelling and neck pain.       Objective    BP (!) 112/59   Pulse (!) 50   Temp 97.6 F (36.4 C) (Oral)   Wt 184 lb 3.2 oz (83.6 kg)   SpO2 98%   BMI 28.01 kg/m    Physical Exam Vitals reviewed.  Constitutional:      General: He is awake.     Appearance: Normal appearance. He is well-developed and well-groomed.  HENT:     Head: Normocephalic and atraumatic.  Musculoskeletal:  Right shoulder: Crepitus present. No swelling, deformity, effusion, laceration, tenderness or bony tenderness. Decreased range of motion. Normal strength.     Left shoulder: Crepitus present. No swelling, deformity, effusion, laceration, tenderness or bony tenderness. Normal range of motion. Normal strength.     Cervical back: No rigidity, torticollis or crepitus. No pain with movement. Normal range of motion.     Comments: ROM is impacted at right shoulder with the following  Abduction, extension - only able to get to approx 90 degress with both External rotation is overall intact but painful- not symmetrical to left  Internal rotation is intact but painful- symmetric to left   Negative empty can test and negative drop arm on right   Neurological:     General: No focal deficit present.     Mental Status: He is alert and oriented to person, place, and time. Mental status is at  baseline.  Psychiatric:        Mood and Affect: Mood normal.        Behavior: Behavior normal. Behavior is cooperative.        Thought Content: Thought content normal.        Judgment: Judgment normal.       No results found for any visits on 07/16/22.  Assessment & Plan      No follow-ups on file.       Problem List Items Addressed This Visit       Other   Acute pain of right shoulder - Primary    Acute, new concern Reports ongoing pain with abduction and extension of right shoulder with reduced ROM compared to left  Passive ROM is painful during apt as well Reviewed imaging results- no evidence of bony abnormality Given symptoms and HPI, I suspect capsulitis or potential rotator cuff injury but cannot exclude muscle strain or bursitis At this time will try to optimize conservative management with regular warm compresses, Voltaren gel, Tylenol, and gentle stretches If not improving with these measures may need to refer to Ortho or PT for eval and further management Work note for light duty provided Follow up as needed for persistent or progressing symptoms.      Relevant Orders   DG Shoulder Right (Completed)     No follow-ups on file.   I, Tripp Goins E Chelsye Suhre, PA-C, have reviewed all documentation for this visit. The documentation on 07/17/22 for the exam, diagnosis, procedures, and orders are all accurate and complete.   Talitha Givens, MHS, PA-C [facility]

## 2022-07-17 ENCOUNTER — Other Ambulatory Visit: Payer: Self-pay | Admitting: Internal Medicine

## 2022-07-17 DIAGNOSIS — M25511 Pain in right shoulder: Secondary | ICD-10-CM | POA: Insufficient documentation

## 2022-07-17 NOTE — Assessment & Plan Note (Signed)
Acute, new concern Reports ongoing pain with abduction and extension of right shoulder with reduced ROM compared to left  Passive ROM is painful during apt as well Reviewed imaging results- no evidence of bony abnormality Given symptoms and HPI, I suspect capsulitis or potential rotator cuff injury but cannot exclude muscle strain or bursitis At this time will try to optimize conservative management with regular warm compresses, Voltaren gel, Tylenol, and gentle stretches If not improving with these measures may need to refer to Ortho or PT for eval and further management Work note for light duty provided Follow up as needed for persistent or progressing symptoms.

## 2022-07-29 ENCOUNTER — Ambulatory Visit: Payer: Medicaid Other | Attending: Internal Medicine | Admitting: Pharmacist

## 2022-07-29 DIAGNOSIS — Z7901 Long term (current) use of anticoagulants: Secondary | ICD-10-CM | POA: Diagnosis not present

## 2022-07-29 DIAGNOSIS — Z5181 Encounter for therapeutic drug level monitoring: Secondary | ICD-10-CM | POA: Diagnosis not present

## 2022-07-29 DIAGNOSIS — Z952 Presence of prosthetic heart valve: Secondary | ICD-10-CM | POA: Diagnosis not present

## 2022-07-29 LAB — POCT INR: INR: 5.4 — AB (ref 2.0–3.0)

## 2022-07-29 NOTE — Patient Instructions (Signed)
Description   HOLD warfarin today, tomorrow, and Friday and then CONTINUE 0.5 tablet every day EXCEPT 1 tablet on Monday, Wednesday and Friday; EAT GREENS 1 DAY PER WEEK; [phone] - Recheck INR in 2 weeks

## 2022-08-05 ENCOUNTER — Emergency Department (HOSPITAL_COMMUNITY): Payer: Medicaid Other

## 2022-08-05 ENCOUNTER — Emergency Department (HOSPITAL_COMMUNITY)
Admission: EM | Admit: 2022-08-05 | Discharge: 2022-08-05 | Disposition: A | Discharge status: Home / Self Care | Payer: Medicaid Other | Attending: Emergency Medicine | Admitting: Emergency Medicine

## 2022-08-05 DIAGNOSIS — Z7901 Long term (current) use of anticoagulants: Secondary | ICD-10-CM | POA: Diagnosis not present

## 2022-08-05 DIAGNOSIS — S0101XA Laceration without foreign body of scalp, initial encounter: Secondary | ICD-10-CM

## 2022-08-05 DIAGNOSIS — S80211A Abrasion, right knee, initial encounter: Secondary | ICD-10-CM | POA: Insufficient documentation

## 2022-08-05 DIAGNOSIS — W19XXXA Unspecified fall, initial encounter: Secondary | ICD-10-CM

## 2022-08-05 DIAGNOSIS — Z79899 Other long term (current) drug therapy: Secondary | ICD-10-CM | POA: Insufficient documentation

## 2022-08-05 DIAGNOSIS — Z7982 Long term (current) use of aspirin: Secondary | ICD-10-CM | POA: Diagnosis not present

## 2022-08-05 DIAGNOSIS — S0501XA Injury of conjunctiva and corneal abrasion without foreign body, right eye, initial encounter: Secondary | ICD-10-CM | POA: Diagnosis not present

## 2022-08-05 DIAGNOSIS — I6381 Other cerebral infarction due to occlusion or stenosis of small artery: Secondary | ICD-10-CM | POA: Diagnosis not present

## 2022-08-05 DIAGNOSIS — R58 Hemorrhage, not elsewhere classified: Secondary | ICD-10-CM | POA: Diagnosis not present

## 2022-08-05 DIAGNOSIS — I10 Essential (primary) hypertension: Secondary | ICD-10-CM | POA: Insufficient documentation

## 2022-08-05 DIAGNOSIS — S0990XA Unspecified injury of head, initial encounter: Secondary | ICD-10-CM | POA: Diagnosis present

## 2022-08-05 DIAGNOSIS — W01198A Fall on same level from slipping, tripping and stumbling with subsequent striking against other object, initial encounter: Secondary | ICD-10-CM | POA: Insufficient documentation

## 2022-08-05 DIAGNOSIS — S0081XA Abrasion of other part of head, initial encounter: Secondary | ICD-10-CM

## 2022-08-05 DIAGNOSIS — S199XXA Unspecified injury of neck, initial encounter: Secondary | ICD-10-CM | POA: Diagnosis not present

## 2022-08-05 DIAGNOSIS — R079 Chest pain, unspecified: Secondary | ICD-10-CM | POA: Diagnosis not present

## 2022-08-05 DIAGNOSIS — R519 Headache, unspecified: Secondary | ICD-10-CM | POA: Diagnosis not present

## 2022-08-05 DIAGNOSIS — S0281XA Fracture of other specified skull and facial bones, right side, initial encounter for closed fracture: Secondary | ICD-10-CM | POA: Diagnosis not present

## 2022-08-05 LAB — URINALYSIS, ROUTINE W REFLEX MICROSCOPIC
Bacteria, UA: NONE SEEN
Bilirubin Urine: NEGATIVE
Glucose, UA: NEGATIVE mg/dL
Ketones, ur: NEGATIVE mg/dL
Leukocytes,Ua: NEGATIVE
Nitrite: NEGATIVE
Protein, ur: NEGATIVE mg/dL
Specific Gravity, Urine: 1.008 (ref 1.005–1.030)
pH: 6 (ref 5.0–8.0)

## 2022-08-05 LAB — COMPREHENSIVE METABOLIC PANEL
ALT: 18 U/L (ref 0–44)
AST: 27 U/L (ref 15–41)
Albumin: 3.5 g/dL (ref 3.5–5.0)
Alkaline Phosphatase: 58 U/L (ref 38–126)
Anion gap: 13 (ref 5–15)
BUN: 11 mg/dL (ref 6–20)
CO2: 20 mmol/L — ABNORMAL LOW (ref 22–32)
Calcium: 8.7 mg/dL — ABNORMAL LOW (ref 8.9–10.3)
Chloride: 104 mmol/L (ref 98–111)
Creatinine, Ser: 1.18 mg/dL (ref 0.61–1.24)
GFR, Estimated: >60 mL/min (ref >60)
Glucose, Bld: 117 mg/dL — ABNORMAL HIGH (ref 70–99)
Potassium: 3.6 mmol/L (ref 3.5–5.1)
Sodium: 137 mmol/L (ref 135–145)
Total Bilirubin: 0.7 mg/dL (ref 0.3–1.2)
Total Protein: 6 g/dL — ABNORMAL LOW (ref 6.5–8.1)

## 2022-08-05 LAB — I-STAT CHEM 8, ED
BUN: 10 mg/dL (ref 6–20)
Calcium, Ion: 1.06 mmol/L — ABNORMAL LOW (ref 1.15–1.40)
Chloride: 103 mmol/L (ref 98–111)
Creatinine, Ser: 1.1 mg/dL (ref 0.61–1.24)
Glucose, Bld: 122 mg/dL — ABNORMAL HIGH (ref 70–99)
HCT: 34 % — ABNORMAL LOW (ref 39.0–52.0)
Hemoglobin: 11.6 g/dL — ABNORMAL LOW (ref 13.0–17.0)
Potassium: 3.6 mmol/L (ref 3.5–5.1)
Sodium: 137 mmol/L (ref 135–145)
TCO2: 25 mmol/L (ref 22–32)

## 2022-08-05 LAB — CBC
HCT: 37 % — ABNORMAL LOW (ref 39.0–52.0)
Hemoglobin: 12.4 g/dL — ABNORMAL LOW (ref 13.0–17.0)
MCH: 31.7 pg (ref 26.0–34.0)
MCHC: 33.5 g/dL (ref 30.0–36.0)
MCV: 94.6 fL (ref 80.0–100.0)
Platelets: 145 10*3/uL — ABNORMAL LOW (ref 150–400)
RBC: 3.91 MIL/uL — ABNORMAL LOW (ref 4.22–5.81)
RDW: 14.5 % (ref 11.5–15.5)
WBC: 5.6 10*3/uL (ref 4.0–10.5)
nRBC: 0 % (ref 0.0–0.2)

## 2022-08-05 LAB — PROTIME-INR
INR: 1.8 — ABNORMAL HIGH (ref 0.8–1.2)
Prothrombin Time: 21.1 seconds — ABNORMAL HIGH (ref 11.4–15.2)

## 2022-08-05 LAB — ETHANOL: Alcohol, Ethyl (B): <10 mg/dL (ref <10)

## 2022-08-05 LAB — SAMPLE TO BLOOD BANK

## 2022-08-05 LAB — LACTIC ACID, PLASMA: Lactic Acid, Venous: 1 mmol/L (ref 0.5–1.9)

## 2022-08-05 MED ORDER — LIDOCAINE-EPINEPHRINE-TETRACAINE (LET) TOPICAL GEL
3.0000 mL | Freq: Once | TOPICAL | Status: AC
  Administered 2022-08-05: 3 mL via TOPICAL
  Filled 2022-08-05: qty 3

## 2022-08-05 MED ORDER — CYCLOBENZAPRINE HCL 10 MG PO TABS
5.0000 mg | ORAL_TABLET | Freq: Once | ORAL | Status: AC
  Administered 2022-08-05: 5 mg via ORAL
  Filled 2022-08-05: qty 1

## 2022-08-05 MED ORDER — LORAZEPAM 1 MG PO TABS
1.0000 mg | ORAL_TABLET | Freq: Once | ORAL | Status: AC
  Administered 2022-08-05: 1 mg via ORAL
  Filled 2022-08-05: qty 1

## 2022-08-05 MED ORDER — FENTANYL CITRATE PF 50 MCG/ML IJ SOSY
50.0000 ug | PREFILLED_SYRINGE | Freq: Once | INTRAMUSCULAR | Status: AC
  Administered 2022-08-05: 50 ug via INTRAVENOUS
  Filled 2022-08-05: qty 1

## 2022-08-05 NOTE — ED Triage Notes (Signed)
Pt BIB EMS, fell at 7/11, tripped over a curb, hit his head, multiple head lacs noted to right side of the head, abrasion to right knee, patient in C-collar. Denies LOC, on Coumadin.  Patient was able to stand and pivot at the scene, denies back and neck pain, c/o headache.  160/70 HR 80 99% RA GCS 15

## 2022-08-05 NOTE — Discharge Instructions (Addendum)
Your CT imaging was negative for acute bleeding in the brain.  Your x-rays were negative for acute fracture or dislocation.  Your CT Face revealed a facial fracture: IMPRESSION:  1. Minimally displaced fracture through the anterior table of the  right frontal sinus that extends to the right orbital roof  2. Large right frontal scalp hematoma.   He also sustained a large scalp laceration which were repaired with staples.  Recommend he follow-up in 7 to 10 days for wound reassessment and staple removal.  Recommend you take your home oxycodone for pain control.  Your Coumadin was subtherapeutic today.  Continue your Coumadin as prescribed.

## 2022-08-05 NOTE — ED Notes (Signed)
Laceration cleansed and LET applied.

## 2022-08-05 NOTE — ED Provider Notes (Signed)
[facility] Provider Note   CSN: [id number] Arrival date & time: 08/05/22  1802     History  Chief Complaint  Patient presents with  . Fall    On thinners    LARON BURGE is a 57 y.o. male.   Fall      Home Medications Prior to Admission medications   Medication Sig Start Date End Date Taking? Authorizing Provider  acetaminophen (TYLENOL) 500 MG tablet Take 500 mg by mouth every 6 (six) hours as needed for headache, fever, moderate pain or mild pain.    [provider]  aspirin 81 MG EC tablet Take 81 mg by mouth daily.    [provider]  Ferrous Sulfate Dried (HIGH POTENCY IRON) 65 MG TABS Take 2 tablets by mouth daily. 08/13/21   [provider]  lisinopril (ZESTRIL) 5 MG tablet Take 1 tablet by mouth once daily 07/17/22   End, Harrell Gave, MD  lovastatin (MEVACOR) 20 MG tablet TAKE 1 TABLET BY MOUTH AT BEDTIME 04/15/22   Jon Billings, NP  Potassium 99 MG TABS Take 2 tablets by mouth every evening.    [provider]  warfarin (COUMADIN) 5 MG tablet TAKE 1 TABLET BY MOUTH  ON  MONDAY,WEDNESDAY  AND  SATURDAY  AND TAKE  ONE-HALF TABLET  ON  ALL  OTHER  DAYS 06/09/22   End, Harrell Gave, MD      Allergies    Alitraq, Iodinated contrast media, and Iodine    Review of Systems   Review of Systems  Physical Exam Updated Vital Signs There were no vitals taken for this visit. Physical Exam  ED Results / Procedures / Treatments   Labs (all labs ordered are listed, but only abnormal results are displayed) Labs Reviewed - No data to display  EKG None  Radiology No results found.  Procedures .Marland KitchenLaceration Repair  Date/Time: 08/05/2022 9:30 PM  Performed by: Regan Lemming, MD Authorized by: Regan Lemming, MD   Consent:    Consent obtained:  Verbal   Consent given by:  Patient   Risks discussed:  Infection, pain and poor cosmetic result Universal protocol:    Patient identity  confirmed:  Verbally with patient Anesthesia:    Anesthesia method:  Topical application   Topical anesthetic:  LET Laceration details:    Location:  Scalp   Scalp location:  R parietal   Length (cm):  4   Depth (mm):  2 Treatment:    Area cleansed with:  Saline   Amount of cleaning:  Standard   Irrigation solution:  Sterile saline Skin repair:    Repair method:  Staples   Number of staples:  9 Approximation:    Approximation:  Close Repair type:    Repair type:  Simple Post-procedure details:    Dressing:  Open (no dressing)   Procedure completion:  Tolerated   {Document cardiac monitor, telemetry assessment procedure when appropriate:1}  Medications Ordered in ED Medications - No data to display  ED Course/ Medical Decision Making/ A&P   {   Click here for ABCD2, HEART and other calculatorsREFRESH Note before signing :1}                          Medical Decision Making Amount and/or Complexity of Data Reviewed Labs: ordered. Radiology: ordered.  Risk Prescription drug management.   ***  {Document critical care time when appropriate:1} {Document review of labs and clinical decision  tools ie heart score, Chads2Vasc2 etc:1}  {Document your independent review of radiology images, and any outside records:1} {Document your discussion with family members, caretakers, and with consultants:1} {Document social determinants of health affecting pt's care:1} {Document your decision making why or why not admission, treatments were needed:1} Final Clinical Impression(s) / ED Diagnoses Final diagnoses:  None    Rx / DC Orders ED Discharge Orders     None

## 2022-08-05 NOTE — Consult Note (Signed)
I was called by ER regarding frontal sinus fracture.  I reviewed the films.  The frontal sinus fracture seen on CT is not clinically significant and requires no intervention or follow up.

## 2022-08-05 NOTE — ED Notes (Signed)
Pt resting in bed awaiting lac repair.

## 2022-08-07 ENCOUNTER — Encounter: Payer: Self-pay | Admitting: Physician Assistant

## 2022-08-07 ENCOUNTER — Ambulatory Visit (INDEPENDENT_AMBULATORY_CARE_PROVIDER_SITE_OTHER): Payer: Medicaid Other | Admitting: Physician Assistant

## 2022-08-07 ENCOUNTER — Telehealth: Payer: Self-pay

## 2022-08-07 ENCOUNTER — Ambulatory Visit: Payer: Self-pay | Admitting: *Deleted

## 2022-08-07 VITALS — BP 114/60 | HR 51 | Temp 97.6°F | Ht 68.0 in | Wt 184.8 lb

## 2022-08-07 DIAGNOSIS — S0101XD Laceration without foreign body of scalp, subsequent encounter: Secondary | ICD-10-CM | POA: Diagnosis not present

## 2022-08-07 NOTE — Transitions of Care (Post Inpatient/ED Visit) (Signed)
   08/07/2022  Name: Ryan [name] MRN: [id number] DOB: [birth year]  Today's TOC FU Call Status: Today's TOC FU Call Status:: Unsuccessul Call (1st Attempt) Unsuccessful Call (1st Attempt) Date: 08/07/22  Attempted to reach the patient regarding the most recent Inpatient/ED visit.  Follow Up Plan: No further outreach attempts will be made at this time. We have been unable to contact the patient.  Mickel Fuchs, BSW, [place] Managed Medicaid Team  [phone]

## 2022-08-07 NOTE — Progress Notes (Signed)
Acute Office Visit   Patient: Ryan [name]   DOB: 05/25/1966   57 y.o. Male  MRN: [id number] Visit Date: 08/07/2022  Today's healthcare provider: Dani Gobble Kaylenn Civil, PA-C  Introduced myself to the patient as a Journalist, newspaper and provided education on APPs in clinical practice.    Chief Complaint  Patient presents with   Wound Check    Patient says his head is still bleeding and wants it be more wrong with the area in his head. Patient had the injury to his head 5 pm on Wednesday. Patient says they have changed the gauzes twice yesterday and they noticed bloody gauze throughout the night.    Subjective    HPI HPI     Wound Check    Additional comments: Patient says his head is still bleeding and wants it be more wrong with the area in his head. Patient had the injury to his head 5 pm on Wednesday. Patient says they have changed the gauzes twice yesterday and they noticed bloody gauze throughout the night.       Last edited by Irena Reichmann, Roberta on 08/07/2022 11:04 AM.       Scalp laceration  Patient was seen in ED on 08/05/22 for fall  He sustained nondisplaced facial fracture and 3 cm laceration to right parietal scalp which was closed with 9 staples He is on Coumadin for anticoagulation, most recent INR was 2 days ago and 1.8   He reports he has changed gauze several times and each time it has been bloody to appearance      Medications: Outpatient Medications Prior to Visit  Medication Sig   acetaminophen (TYLENOL) 500 MG tablet Take 500 mg by mouth every 6 (six) hours as needed for headache, fever, moderate pain or mild pain.   aspirin 81 MG EC tablet Take 81 mg by mouth daily.   Ferrous Sulfate Dried (HIGH POTENCY IRON) 65 MG TABS Take 2 tablets by mouth daily.   lisinopril (ZESTRIL) 5 MG tablet Take 1 tablet by mouth once daily   lovastatin (MEVACOR) 20 MG tablet TAKE 1 TABLET BY MOUTH AT BEDTIME   Potassium 99 MG TABS Take 2 tablets by mouth every evening.   warfarin  (COUMADIN) 5 MG tablet TAKE 1 TABLET BY MOUTH  ON  MONDAY,WEDNESDAY  AND  SATURDAY  AND TAKE  ONE-HALF TABLET  ON  ALL  OTHER  DAYS   No facility-administered medications prior to visit.    Review of Systems  Skin:  Positive for wound.       Scalp laceration   Hematological:  Bruises/bleeds easily.       Objective    BP 114/60   Pulse (!) 51   Temp 97.6 F (36.4 C) (Oral)   Ht '5\' 8"'$  (1.727 m)   Wt 184 lb 12.8 oz (83.8 kg)   SpO2 98%   BMI 28.10 kg/m    Physical Exam Vitals reviewed.  Constitutional:      General: He is awake.     Appearance: Normal appearance. He is well-developed and well-groomed.  HENT:     Head: Normocephalic. Raccoon eyes, abrasion, contusion and laceration present.   Eyes:     General: Gaze aligned appropriately.     Extraocular Movements: Extraocular movements intact.     Right eye: Normal extraocular motion.     Left eye: Normal extraocular motion.     Conjunctiva/sclera: Conjunctivae normal.     Comments:  Right ocular orbit is bruised and swollen   Neurological:     General: No focal deficit present.     Mental Status: He is alert and oriented to person, place, and time. Mental status is at baseline.  Psychiatric:        Mood and Affect: Mood normal.        Behavior: Behavior normal. Behavior is cooperative.        Thought Content: Thought content normal.        Judgment: Judgment normal.       Media Information   Document Information  Photos    08/07/2022 11:29  Attached To:  Office Visit on 08/07/22 with Isobel Eisenhuth, Ryan Gobble, PA-C  Source Information  Modelle Vollmer, Ryan Gobble, PA-C  [facility]    No results found for any visits on 08/07/22.  Assessment & Plan      No follow-ups on file.      Problem List Items Addressed This Visit   None Visit Diagnoses     Laceration of scalp without foreign body, subsequent encounter    -  Primary Acute, ongoing concern Patient was seen in ED on 08/05/22 for fall where he obtained  laceration and facial fracture  Patient was concerned today due to prolonged bleeding due to chronic Coumadin use  PE demonstrates what appears to be well-healing laceration with 9 staples - no active bleeding noted  Laceration was cleansed with sterile solution and dressing was replaced with nonstick gauze and antibiotic ointment  Discussed daily wound care with patient and caregiver  Recommend he return in about a week for staple removal.  Follow up as needed for concerns.          No follow-ups on file.   I, Styles Fambro E Judee Hennick, PA-C, have reviewed all documentation for this visit. The documentation on 08/07/22 for the exam, diagnosis, procedures, and orders are all accurate and complete.   Talitha Givens, MHS, PA-C [facility]

## 2022-08-07 NOTE — Telephone Encounter (Signed)
  Chief Complaint: incision to scalp bleeding and patient takes coumadin Symptoms: fell Wednesday and treated in ED 9 staples placed. After changing dressing to right side of head dressing noted this am saturated with blood. Dizziness and headache continue but patient reports no bad. Frequency: this am Pertinent Negatives: Patient denies neurological issues no active bleeding running out of incision only saturated on dressing. Disposition: [x]$ ED /[]$ Urgent Care (no appt availability in office) / []$ Appointment(In office/virtual)/ []$  [facility]/ []$ Home Care/ []$ Refused Recommended Disposition /[]$ [place] [facility]/ []$  Follow-up with PCP Additional Notes:   Recommended to use clean gauze and apply pressure to help bleeding stop. Recommended patient go back to ED / UC . Patient would like to see PCP or another provider if possible rather than sit in ED. Unsure if changing dressing caused bleeding to restart again. [facility] aware to assist to notify PCP if patient can come in for OV today .    Reason for Disposition  Taking Coumadin (warfarin) or other strong blood thinner, or known bleeding disorder (e.g., thrombocytopenia)  Answer Assessment - Initial Assessment Questions 1. MECHANISM: "How did the injury happen?" For falls, ask: "What height did you fall from?" and "What surface did you fall against?"      Fell Wednesday hit right side of head seen in ED 2. ONSET: "When did the injury happen?" (Minutes or hours ago)      08/05/22 3. NEUROLOGIC SYMPTOMS: "Was there any loss of consciousness?" "Are there any other neurological symptoms?"      Na . 4. MENTAL STATUS: "Does the person know who they are, who you are, and where they are?"      Yes  5. LOCATION: "What part of the head was hit?"      Right side of head 6. SCALP APPEARANCE: "What does the scalp look like? Is it bleeding now?" If Yes, ask: "Is it difficult to stop?"      Staples in place and bleeding saturated bandage after  being changed last night  7. SIZE: For cuts, bruises, or swelling, ask: "How large is it?" (e.g., inches or centimeters)      Incision with 9 staples 8. PAIN: "Is there any pain?" If Yes, ask: "How bad is it?"  (e.g., Scale 1-10; or mild, moderate, severe)     Na  9. TETANUS: For any breaks in the skin, ask: "When was the last tetanus booster?"     na 10. OTHER SYMPTOMS: "Do you have any other symptoms?" (e.g., neck pain, vomiting)       Dizziness , headaches. Patient is taking coumadin  11. PREGNANCY: "Is there any chance you are pregnant?" "When was your last menstrual period?"       na  Protocols used: Head Injury-A-AH

## 2022-08-07 NOTE — Telephone Encounter (Signed)
Patient has an appt for today.

## 2022-08-12 ENCOUNTER — Ambulatory Visit: Payer: Medicaid Other | Attending: Internal Medicine

## 2022-08-12 DIAGNOSIS — Z5181 Encounter for therapeutic drug level monitoring: Secondary | ICD-10-CM

## 2022-08-12 DIAGNOSIS — Z952 Presence of prosthetic heart valve: Secondary | ICD-10-CM

## 2022-08-12 DIAGNOSIS — Z7901 Long term (current) use of anticoagulants: Secondary | ICD-10-CM

## 2022-08-12 LAB — POCT INR: INR: 4.7 — AB (ref 2.0–3.0)

## 2022-08-12 NOTE — Patient Instructions (Signed)
Description   HOLD warfarin today and tomorrow, the start taking 1/2 tablet every day EXCEPT 1 tablet on Mondays and Fridays.  EAT GREENS 1 DAY PER WEEK;. [phone]  Recheck INR in 2 weeks

## 2022-08-14 ENCOUNTER — Ambulatory Visit: Payer: Medicaid Other | Admitting: Physician Assistant

## 2022-08-14 VITALS — BP 131/67 | HR 55 | Temp 97.9°F | Ht 67.99 in | Wt 183.9 lb

## 2022-08-14 DIAGNOSIS — Z4802 Encounter for removal of sutures: Secondary | ICD-10-CM

## 2022-08-14 DIAGNOSIS — S0101XD Laceration without foreign body of scalp, subsequent encounter: Secondary | ICD-10-CM | POA: Diagnosis not present

## 2022-08-14 NOTE — Progress Notes (Unsigned)
Acute Office Visit   Patient: Ryan [name]   DOB: 09/02/1965   57 y.o. Male  MRN: [id number] Visit Date: 08/14/2022  Today's healthcare provider: Dani Gobble Finley Chevez, PA-C  Introduced myself to the patient as a Journalist, newspaper and provided education on APPs in clinical practice.    Chief Complaint  Patient presents with   Suture / Staple Removal    Fell on 2/21 and hit his head and received a head lac, went to ER received 9 staples, concussion, and nose fx.   Subjective    Suture / Staple Removal   HPI     Suture / Staple Removal    Additional comments: Fell on 2/21 and hit his head and received a head lac, went to ER received 9 staples, concussion, and nose fx.      Last edited by Jerelene Redden, CMA on 08/14/2022 11:05 AM.      Scalp laceration Patient sustained fall with scalp laceration and facial fracture on 08/05/22  He is here for scheduled staple removal  Laceration appears well healed at this time with some remaining scabbing along borders   Medications: Outpatient Medications Prior to Visit  Medication Sig   acetaminophen (TYLENOL) 500 MG tablet Take 500 mg by mouth every 6 (six) hours as needed for headache, fever, moderate pain or mild pain.   aspirin 81 MG EC tablet Take 81 mg by mouth daily.   Ferrous Sulfate Dried (HIGH POTENCY IRON) 65 MG TABS Take 2 tablets by mouth daily.   lisinopril (ZESTRIL) 5 MG tablet Take 1 tablet by mouth once daily   lovastatin (MEVACOR) 20 MG tablet TAKE 1 TABLET BY MOUTH AT BEDTIME   Potassium 99 MG TABS Take 2 tablets by mouth every evening.   warfarin (COUMADIN) 5 MG tablet TAKE 1 TABLET BY MOUTH  ON  MONDAY,WEDNESDAY  AND  SATURDAY  AND TAKE  ONE-HALF TABLET  ON  ALL  OTHER  DAYS   No facility-administered medications prior to visit.    Review of Systems  Skin:        Scalp laceration with staples - ready for removal     {Labs  Heme  Chem  Endocrine  Serology  Results Review (optional):23779}   Objective    BP 131/67    Pulse (!) 55   Temp 97.9 F (36.6 C) (Oral)   Ht 5' 7.99" (1.727 m)   Wt 183 lb 14.4 oz (83.4 kg)   SpO2 99%   BMI 27.97 kg/m  {Show previous vital signs (optional):23777}  Physical Exam Vitals reviewed.  Constitutional:      General: He is awake.     Appearance: Normal appearance. He is well-developed and well-groomed.  HENT:     Head: Normocephalic. Raccoon eyes, abrasion, contusion and laceration present.   Eyes:     General: Gaze aligned appropriately.     Extraocular Movements: Extraocular movements intact.     Conjunctiva/sclera: Conjunctivae normal.  Musculoskeletal:     Cervical back: Normal range of motion.  Neurological:     General: No focal deficit present.     Mental Status: He is alert and oriented to person, place, and time. Mental status is at baseline.  Psychiatric:        Mood and Affect: Mood normal.        Behavior: Behavior normal. Behavior is cooperative.        Thought Content: Thought content normal.  Judgment: Judgment normal.     Staple removal  9 staples were removed after cleansing area with alcohol prep pads No blood loss and edges of wound appear well healed and closed at this time Lingering scabbing was minimally disturbed Reviewed after care with patient to keep laceration clean and free of contamination while remainder of healing is underway    No results found for any visits on 08/14/22.  Assessment & Plan      No follow-ups on file.      Problem List Items Addressed This Visit   None Visit Diagnoses     Laceration of skin of scalp, subsequent encounter    -  Primary        No follow-ups on file.   I, Kalasia Crafton E Taquanna Borras, PA-C, have reviewed all documentation for this visit. The documentation on 08/14/22 for the exam, diagnosis, procedures, and orders are all accurate and complete.   Talitha Givens, MHS, PA-C [facility]

## 2022-08-19 ENCOUNTER — Encounter: Payer: Self-pay | Admitting: Internal Medicine

## 2022-08-19 ENCOUNTER — Ambulatory Visit: Payer: Medicaid Other | Attending: Internal Medicine | Admitting: Internal Medicine

## 2022-08-19 VITALS — BP 112/84 | HR 62 | Ht 67.0 in | Wt 184.0 lb

## 2022-08-19 DIAGNOSIS — I10 Essential (primary) hypertension: Secondary | ICD-10-CM | POA: Diagnosis not present

## 2022-08-19 DIAGNOSIS — I480 Paroxysmal atrial fibrillation: Secondary | ICD-10-CM

## 2022-08-19 DIAGNOSIS — E782 Mixed hyperlipidemia: Secondary | ICD-10-CM | POA: Diagnosis not present

## 2022-08-19 DIAGNOSIS — Z952 Presence of prosthetic heart valve: Secondary | ICD-10-CM | POA: Diagnosis not present

## 2022-08-19 DIAGNOSIS — M25511 Pain in right shoulder: Secondary | ICD-10-CM | POA: Diagnosis not present

## 2022-08-19 DIAGNOSIS — M7591 Shoulder lesion, unspecified, right shoulder: Secondary | ICD-10-CM | POA: Diagnosis not present

## 2022-08-19 DIAGNOSIS — Z8679 Personal history of other diseases of the circulatory system: Secondary | ICD-10-CM

## 2022-08-19 DIAGNOSIS — M7541 Impingement syndrome of right shoulder: Secondary | ICD-10-CM | POA: Diagnosis not present

## 2022-08-19 NOTE — Patient Instructions (Signed)
Medication Instructions:  No change *If you need a refill on your cardiac medications before your next appointment, please call your pharmacy*   Lab Work: None ordered If you have labs (blood work) drawn today and your tests are completely normal, you will receive your results only by: [place] (if you have MyChart) OR A paper copy in the mail If you have any lab test that is abnormal or we need to change your treatment, we will call you to review the results.   Testing/Procedures: None ordered   Follow-Up: At [facility], you and your health needs are our priority.  As part of our continuing mission to provide you with exceptional heart care, we have created designated Provider Care Teams.  These Care Teams include your primary Cardiologist (physician) and Advanced Practice Providers (APPs -  Physician Assistants and Nurse Practitioners) who all work together to provide you with the care you need, when you need it.  We recommend signing up for the patient portal called "MyChart".  Sign up information is provided on this After Visit Summary.  MyChart is used to connect with patients for Virtual Visits (Telemedicine).  Patients are able to view lab/test results, encounter notes, upcoming appointments, etc.  Non-urgent messages can be sent to your provider as well.   To learn more about what you can do with MyChart, go to [email or web address].    Your next appointment:   6 month(s)  Provider:   You may see Nelva Bush, MD or one of the following Advanced Practice Providers on your designated Care Team:   Murray Hodgkins, NP Christell Faith, PA-C Cadence Kathlen Mody, PA-C Gerrie Nordmann, NP

## 2022-08-19 NOTE — Progress Notes (Unsigned)
Follow-up Outpatient Visit Date: 08/19/2022  Primary Care Provider: Jon Billings, NP [place] [address]  Chief Complaint: Follow-up aortic dissection and mechanical AVR  HPI:  Mr. Ryan [name] is a 57 y.o. 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  I last saw him in 02/2019 23, at which time he was feeling well with only minimal chronic chest pain.  He noted that he had reduced his lisinopril to 2.5 mg daily due to soft blood pressures at home.  We did not make any medication changes or pursue further testing.  Today, Mr. Ostwald reports that he is feeling well, though he is still recovering from a recent fall.  He tripped over the curb at [employer] and ran head-first in a door. He suffered scalp laceration and multiple facial injuries.  He reports rare heartburn but otherwise denies chest pain, shortness of breath, palpitations, lightheadedness, and edema.  He has not had any bleeding other than from his recent fall.  He remains complaint with warfarin.  His most recent INR was supratherapeutic; he is scheduled for follow-up in he anticoagulation clinic next week.  --------------------------------------------------------------------------------------------------  Cardiovascular History & Procedures: Cardiovascular Problems: Aortic dissection status post root repair and mechanical AVR with residual chronic dissection extending from innominate artery through the aortic bifurcation Stroke Chronic left bundle branch  block   Risk Factors: Stroke, hypertension, hyperlipidemia, and male gender   Cath/PCI: LHC (07/22/2020, [facility]): LMCA normal.  LAD normal.  LCx normal.  Dominant RCA normal.   CV Surgery: Aortic root repair and mechanical AVR (2001)   EP Procedures and Devices: None   Non-Invasive Evaluation(s): Pharmacologic MPI (12/12/2021): Abnormal, potentially high risk pharmacologic myocardial perfusion stress test.  There is a large, fixed defect involving the inferior and inferolateral walls most consistent with scar but cannot rule out an element of artifact.  No significant ischemia identified.  LVEF 50% by Siemens calculation.  Transient ischemic dilation noted (TID1.35). CTA chest (09/28/2021): Complex aortic dissection with aortic valve replacement.  Dilated aortic root measuring up to 5.1 cm, aortic arch measuring up to 4.6 cm and descending aorta measuring up to 5.4 cm.  Dissections extend into all great vessels except for the left common carotid artery.  Duplicated SVC noted. TTE (08/27/2021): Normal LV size with mild LVH.  LVEF 50-55% with grade 2 diastolic dysfunction.  Mildly dilated RV with normal contraction.  Mild right atrial enlargement.  Mild mitral regurgitation.  Mechanical aortic valve present with mean gradient 7 mmHg.  Normal CVP. CTA chest, abdomen, and pelvis (04/25/2021, [facility]): Patient status post AVR and ascending aortic graft.  Residual dissection in the arch to the level of the bilateral common iliac arteries.  Aortic root measures up to 5.0 cm.  Arch and descending aorta measure up to 4.8 cm.  Findings are stable from prior study in 01/2021. TTE (02/05/2021, [facility]): Technically difficult study.  Mildly dilated left ventricle with LVEF 55-60% with abnormal septal motion.  Moderate left atrial enlargement.  Normal mechanical AVR gradient (mean gradient 7 mmHg) with mild regurgitation.  Recent CV Pertinent Labs: Lab Results  Component Value  Date   CHOL 81  (L) 09/29/2021   HDL 28 (L) 09/29/2021   LDLCALC 18 09/29/2021   TRIG 232 (H) 09/29/2021   CHOLHDL 2.9 09/29/2021   CHOLHDL 3.5 08/27/2021   INR 4.7 (A) 08/12/2022   INR 1.8 (H) 08/05/2022   BNP 50.8 01/02/2022   K 3.6 08/05/2022   MG 2.0 02/01/2022   BUN 10 08/05/2022   BUN 9 12/31/2021   CREATININE 1.10 08/05/2022    Past medical and surgical history were reviewed and updated in EPIC.  Current Meds  Medication Sig   acetaminophen (TYLENOL) 500 MG tablet Take 500 mg by mouth every 6 (six) hours as needed for headache, fever, moderate pain or mild pain.   aspirin 81 MG EC tablet Take 81 mg by mouth daily.   Ferrous Sulfate Dried (HIGH POTENCY IRON) 65 MG TABS Take 2 tablets by mouth daily.   lisinopril (ZESTRIL) 5 MG tablet Take 1 tablet by mouth once daily   lovastatin (MEVACOR) 20 MG tablet TAKE 1 TABLET BY MOUTH AT BEDTIME   Potassium 99 MG TABS Take 2 tablets by mouth every evening.   warfarin (COUMADIN) 5 MG tablet TAKE 1 TABLET BY MOUTH  ON  MONDAY,WEDNESDAY  AND  SATURDAY  AND TAKE  ONE-HALF TABLET  ON  ALL  OTHER  DAYS    Allergies: Alitraq, Iodinated contrast media, and Iodine  Social History   Tobacco Use   Smoking status: Never    Passive exposure: Past   Smokeless tobacco: Never  Vaping Use   Vaping Use: Never used  Substance Use Topics   Alcohol use: Yes    Comment: 2 mixed drinks per year   Drug use: Never    Family History  Problem Relation Age of Onset   Heart attack Mother    Hypertension Mother    Hyperlipidemia Mother    Heart disease Mother    Heart attack Maternal Grandfather     Review of Systems: A 12-system review of systems was performed and was negative except as noted in the HPI.  --------------------------------------------------------------------------------------------------  Physical Exam: BP 112/84 (BP Location: Left Arm, Patient Position: Sitting, Cuff Size: Normal)   Pulse 62   Ht '5\' 7"'$  (1.702 m)   Wt 184 lb (83.5 kg)    SpO2 98%   BMI 28.82 kg/m   General:  NAD. Head: Healing scalp and right cheek lacerations.  Mild bruising noted about the right orbit. Neck: No JVD or HJR. Lungs: Clear to auscultation bilaterally without wheezes or crackles. Heart: Regular rate and rhythm with 2/6 systolic murmur. Abdomen: Soft, nontender, nondistended. Extremities: No lower extremity edema.  EKG:  Normal sinus rhythm with PVC's and LBBB.  No significant change from prior tracing on 08/05/2022.  Lab Results  Component Value Date   WBC 5.6 08/05/2022   HGB 11.6 (L) 08/05/2022   HCT 34.0 (L) 08/05/2022   MCV 94.6 08/05/2022   PLT 145 (L) 08/05/2022    Lab Results  Component Value Date   NA 137 08/05/2022   K 3.6 08/05/2022   CL 103 08/05/2022   CO2 20 (L) 08/05/2022   BUN 10 08/05/2022   CREATININE 1.10 08/05/2022   GLUCOSE 122 (H) 08/05/2022   ALT 18 08/05/2022    Lab Results  Component Value Date   CHOL 81 (L) 09/29/2021   HDL 28 (L) 09/29/2021   LDLCALC 18 09/29/2021   TRIG 232 (H) 09/29/2021   CHOLHDL 2.9 09/29/2021    --------------------------------------------------------------------------------------------------  ASSESSMENT  AND PLAN: Aortic dissection s/p repair and mechanical AVR: Doing well; asymptomatic.  Continue indefinite anticoagulation with warfarin.  Continue annual follow-up with Dr. Cyndia Bent (due around 11/2022).  PAF: No palpitations reported.  EKG today shows NSR with PVC's and LBBB - old.  Continue indefinite warfarin given mechanical AVR.  No AV-nodal blockers given history of resting bradycardia.  Hypertension: BP reasonable today.  Continue lisinopril 2.5 mg daily  Hyperlipidemia: LDL very well-controlled on last check in 09/2021.  Continue lovastatin and work on lifestyle modifications to help improve triglycerides and HDL.  Follow-up: Return to clinic in 6 months.  Nelva Bush, MD 08/19/2022 4:39 PM

## 2022-08-20 ENCOUNTER — Encounter: Payer: Self-pay | Admitting: Internal Medicine

## 2022-08-26 ENCOUNTER — Ambulatory Visit: Payer: Medicaid Other | Attending: Internal Medicine

## 2022-08-26 DIAGNOSIS — Z952 Presence of prosthetic heart valve: Secondary | ICD-10-CM | POA: Diagnosis not present

## 2022-08-26 DIAGNOSIS — Z5181 Encounter for therapeutic drug level monitoring: Secondary | ICD-10-CM | POA: Diagnosis not present

## 2022-08-26 DIAGNOSIS — Z7901 Long term (current) use of anticoagulants: Secondary | ICD-10-CM

## 2022-08-26 LAB — POCT INR: INR: 3.6 — AB (ref 2.0–3.0)

## 2022-08-26 NOTE — Patient Instructions (Signed)
Description   HOLD warfarin today, then start taking 1/2 tablet every day EXCEPT 1 tablet on Mondays.   EAT GREENS 1 DAY PER WEEK;. [phone]  Recheck INR in 3 weeks

## 2022-09-02 ENCOUNTER — Other Ambulatory Visit: Payer: Self-pay | Admitting: Internal Medicine

## 2022-09-02 NOTE — Telephone Encounter (Signed)
Please assist p/t with Warfarin refill. Thank you

## 2022-09-09 ENCOUNTER — Ambulatory Visit: Payer: Self-pay | Admitting: *Deleted

## 2022-09-09 ENCOUNTER — Telehealth: Payer: Self-pay | Admitting: Internal Medicine

## 2022-09-09 ENCOUNTER — Other Ambulatory Visit: Payer: Self-pay

## 2022-09-09 ENCOUNTER — Emergency Department
Admission: EM | Admit: 2022-09-09 | Discharge: 2022-09-09 | Disposition: A | Discharge status: Home / Self Care | Payer: Medicaid Other | Attending: Emergency Medicine | Admitting: Emergency Medicine

## 2022-09-09 DIAGNOSIS — R040 Epistaxis: Secondary | ICD-10-CM | POA: Diagnosis not present

## 2022-09-09 DIAGNOSIS — R791 Abnormal coagulation profile: Secondary | ICD-10-CM | POA: Insufficient documentation

## 2022-09-09 DIAGNOSIS — Z7901 Long term (current) use of anticoagulants: Secondary | ICD-10-CM | POA: Insufficient documentation

## 2022-09-09 DIAGNOSIS — I10 Essential (primary) hypertension: Secondary | ICD-10-CM | POA: Insufficient documentation

## 2022-09-09 LAB — CBC WITH DIFFERENTIAL/PLATELET
Abs Immature Granulocytes: 0.03 10*3/uL (ref 0.00–0.07)
Basophils Absolute: 0 10*3/uL (ref 0.0–0.1)
Basophils Relative: 0 %
Eosinophils Absolute: 0.1 10*3/uL (ref 0.0–0.5)
Eosinophils Relative: 2 %
HCT: 42.2 % (ref 39.0–52.0)
Hemoglobin: 13.7 g/dL (ref 13.0–17.0)
Immature Granulocytes: 1 %
Lymphocytes Relative: 16 %
Lymphs Abs: 1 10*3/uL (ref 0.7–4.0)
MCH: 31.6 pg (ref 26.0–34.0)
MCHC: 32.5 g/dL (ref 30.0–36.0)
MCV: 97.2 fL (ref 80.0–100.0)
Monocytes Absolute: 0.3 10*3/uL (ref 0.1–1.0)
Monocytes Relative: 4 %
Neutro Abs: 4.9 10*3/uL (ref 1.7–7.7)
Neutrophils Relative %: 77 %
Platelets: 179 10*3/uL (ref 150–400)
RBC: 4.34 MIL/uL (ref 4.22–5.81)
RDW: 14.2 % (ref 11.5–15.5)
WBC: 6.4 10*3/uL (ref 4.0–10.5)
nRBC: 0 % (ref 0.0–0.2)

## 2022-09-09 LAB — BASIC METABOLIC PANEL
Anion gap: 3 — ABNORMAL LOW (ref 5–15)
BUN: 15 mg/dL (ref 6–20)
CO2: 25 mmol/L (ref 22–32)
Calcium: 8.2 mg/dL — ABNORMAL LOW (ref 8.9–10.3)
Chloride: 107 mmol/L (ref 98–111)
Creatinine, Ser: 1.04 mg/dL (ref 0.61–1.24)
GFR, Estimated: >60 mL/min (ref >60)
Glucose, Bld: 116 mg/dL — ABNORMAL HIGH (ref 70–99)
Potassium: 4.3 mmol/L (ref 3.5–5.1)
Sodium: 135 mmol/L (ref 135–145)

## 2022-09-09 LAB — PROTIME-INR
INR: 2.6 — ABNORMAL HIGH (ref 0.8–1.2)
Prothrombin Time: 27.8 seconds — ABNORMAL HIGH (ref 11.4–15.2)

## 2022-09-09 MED ORDER — OXYMETAZOLINE HCL 0.05 % NA SOLN
1.0000 | Freq: Once | NASAL | Status: AC
  Administered 2022-09-09: 1 via NASAL
  Filled 2022-09-09: qty 30

## 2022-09-09 MED ORDER — SILVER NITRATE-POT NITRATE 75-25 % EX MISC
1.0000 | Freq: Once | CUTANEOUS | Status: AC
  Administered 2022-09-09: 1 via TOPICAL
  Filled 2022-09-09: qty 10

## 2022-09-09 NOTE — Discharge Instructions (Signed)
Use the Afrin twice daily for the next couple of days.  Return to the ER immediately for new, worsening, or recurrent nosebleed, any other abnormal bleeding or bruising, or any other symptoms that concern you.

## 2022-09-09 NOTE — ED Triage Notes (Signed)
Pt to ED POV and ambulatory with steady gait for nosebleed since 1100 this AM and takes Coumadin. Went to UC, has spongelike material to L nostril ("nose condom") that was placed at [facility], pt was told that if he kept bleeding to come to ER. Material is blood tinged but no active bleeding noted.   Pt has hx aortic aneurysm and dissection. Denies any chest pain or SOB. In no acute distress.

## 2022-09-09 NOTE — ED Provider Notes (Signed)
[facility] Provider Note    Event Date/Time   First MD Initiated Contact with Patient 09/09/22 1531     (approximate)   History   Epistaxis   HPI  Ryan [name] is a 57 y.o. 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  The patient went to urgent care and had packing placed, but since then has had blood oozing from the front of the packing can still feel blood going down the back of his throat.  He denies feeling dizziness or lightheadedness.  He has no headache or other acute pain.  He does report a prior history of nosebleeds when it has been dry out.  I reviewed the past medical records per the patient's most recent outpatient encounter was on 3/6 with Dr. Saunders Revel from cardiology.  His most recent INR at that time was subtherapeutic.  He had no acute issues at that visit.  He has no recent inpatient admissions.   Physical Exam   Triage Vital Signs: ED Triage Vitals  Enc Vitals Group     BP 09/09/22 1501 136/84     Pulse Rate 09/09/22 1501 (!) 58     Resp 09/09/22 1501 18     Temp 09/09/22 1501 97.6 F (36.4 C)     Temp Source 09/09/22 1501 Oral     SpO2 09/09/22 1501 99 %     Weight 09/09/22 1502 184 lb (83.5 kg)     Height 09/09/22 1502 5\' 7"  (1.702 m)     Head Circumference --      Peak Flow --      Pain Score 09/09/22 1502 0     Pain Loc --      Pain Edu? --      Excl. in [place]? --     Most recent vital signs: Vitals:   09/09/22 1501  BP: 136/84  Pulse: (!) 58  Resp: 18  Temp: 97.6 F (36.4 C)  SpO2: 99%     General: Awake, no distress.  CV:  Good peripheral perfusion.  Resp:  Normal effort.  Abd:  No distention.  Other:  Left nasal passage with packing in place.   ED Results / Procedures / Treatments   Labs (all labs ordered are listed, but  only abnormal results are displayed) Labs Reviewed  BASIC METABOLIC PANEL - Abnormal; Notable for the following components:      Result Value   Glucose, Bld 116 (*)    Calcium 8.2 (*)    Anion gap 3 (*)    All other components within normal limits  PROTIME-INR - Abnormal; Notable for the following components:   Prothrombin Time 27.8 (*)    INR 2.6 (*)    All other components within normal limits  CBC WITH DIFFERENTIAL/PLATELET     EKG     RADIOLOGY    PROCEDURES:  Critical Care performed: No  Procedures   MEDICATIONS ORDERED IN ED: Medications  silver nitrate applicators applicator 1 Stick (1 Stick Topical Given by Other 09/09/22 1633)  oxymetazoline (AFRIN) 0.05 % nasal spray 1 spray (1 spray Left Nare Given 09/09/22 1633)     IMPRESSION / MDM / ASSESSMENT AND PLAN / ED COURSE  I reviewed the triage vital signs and the nursing notes.   Differential diagnosis includes, but is not  limited to, anterior epistaxis, posterior epistaxis.  I will remove the packing, cauterize if needed, and reapply packing if needed, as well as obtaining lab workup.  Patient's presentation is most consistent with acute complicated illness / injury requiring diagnostic workup.  The patient is on the cardiac monitor to evaluate for evidence of arrhythmia and/or significant heart rate changes.  ----------------------------------------- 5:19 PM on 09/09/2022 -----------------------------------------  Labs are unremarkable.  INR is therapeutic at 2.6.  Hemoglobin is normal at 13.7.  I removed the Merocel-type packing that was in place from urgent care.  I could not identify any actively bleeding vessel and the patient did not feel anything going down the back of his throat.  There is no indication for cautery.  We have now observe the patient for close to an hour after removing the packing and he has not had any recurrence.  We have administered Afrin.  At this time, the patient is stable  for discharge home.  He does not require new packing.  I gave strict return precautions and he expresses understanding.   FINAL CLINICAL IMPRESSION(S) / ED DIAGNOSES   Final diagnoses:  Anterior epistaxis     Rx / DC Orders   ED Discharge Orders     None        Note:  This document was prepared using Dragon voice recognition software and may include unintentional dictation errors.    Arta Silence, MD 09/09/22 [phone]

## 2022-09-09 NOTE — Telephone Encounter (Signed)
  Chief Complaint: nosebleed that keeps soaking through the tissue he is putting in his nose.   Takes Coumadin. Symptoms: 8 minutes ago his nose started bleeding and he can't get it stopped with correct pressure on his nose and tissue in his nose. Frequency: happening now Pertinent Negatives: Patient denies N/A Disposition: [x] ED /[] Urgent Care (no appt availability in office) / [] Appointment(In office/virtual)/ []  [facility]/ [] Home Care/ [] Refused Recommended Disposition /[] [facility]/ []  Follow-up with PCP Additional Notes: Referred to the ED since he is on Coumadin.   He has had several episodes of nose bleeds where he has had to go to the ED.    Last one about 6 months ago. He was agreeable to going and has someone with him who can take him now.  Notes sent to Jon Billings, NP.

## 2022-09-09 NOTE — Telephone Encounter (Signed)
Pt c/o medication issue:  1. Name of Medication: warfarin (COUMADIN) 5 MG tablet   2. How are you currently taking this medication (dosage and times per day)?   Take 1/2 to one tablet by mouth once daily as instructed by anticoagulation clinic.    3. Are you having a reaction (difficulty breathing--STAT)? No  4. What is your medication issue? Pt stated he's on above medication and has been having a nose bleed for about an hour. Pt is very concerned.

## 2022-09-09 NOTE — Telephone Encounter (Signed)
Reason for Disposition  [1] Has nasal packing AND [2] now bleeding around the packing  (Exception: Few drops or ooze.)  Answer Assessment - Initial Assessment Questions 1. AMOUNT OF BLEEDING: "How bad is the bleeding?" "How much blood was lost?" "Has the bleeding stopped?"   - MILD: needed a couple tissues   - MODERATE: needed many tissues   - SEVERE: large blood clots, soaked many tissues, lasted more than 30 minutes      I'm on coumadin and I can't get my nose to stop bleeding.   I have tissue in my nose but I'm changing it frequently due to blood.    I got up last night and got choked on tea and was coughing real hard after that.   This morning went to bathroom and I wiped my nose and it was bleeding.   I sat up and it started pouring and I'm holding pressure on it.     2. ONSET: "When did the nosebleed start?"      Started 8 minutes ago.   I keep soaking the tissue I'm rolling up and putting in my nose. 3. FREQUENCY: "How many nosebleeds have you had in the last 24 hours?"      Several times I've had nosebleeds but it's been about 6 months ago since my last episode. 4. RECURRENT SYMPTOMS: "Have there been other recent nosebleeds?" If Yes, ask: "How long did it take you to stop the bleeding?" "What worked best?"      Yes   Last one was about 6 months ago 5. CAUSE: "What do you think caused this nosebleed?"     I'm on coumadin.   I got choked on tea during the night and hard a hard coughing spell.   I think that may have triggered it. 6. LOCAL FACTORS: "Do you have any cold symptoms?", "Have you been rubbing or picking at your nose?"     Not asked 7. SYSTEMIC FACTORS: "Do you have high blood pressure or any bleeding problems?"     Not asked 8. BLOOD THINNERS: "Do you take any blood thinners?" (e.g., aspirin, clopidogrel / Plavix, coumadin, heparin). Notes: Other strong blood thinners include: Arixtra (fondaparinux), Eliquis (apixaban), Pradaxa (dabigatran), and Xarelto (rivaroxaban).      Coumadin 9. OTHER SYMPTOMS: "Do you have any other symptoms?" (e.g., lightheadedness)     No 10. PREGNANCY: "Is there any chance you are pregnant?" "When was your last menstrual period?"       N/A  Protocols used: Nosebleed-A-AH

## 2022-09-09 NOTE — Telephone Encounter (Signed)
The patient called in with complaints of heavy nose bleeding for the past hour. He said every time that he feels it has stopped and he releases pressure that is starts again. His family member is concerned because she feels like it is a lot of blood.   He has been advised to head to the ED. He verbalized his understanding.

## 2022-09-16 ENCOUNTER — Ambulatory Visit: Payer: Medicaid Other | Attending: Internal Medicine

## 2022-09-16 ENCOUNTER — Telehealth: Payer: Self-pay | Admitting: *Deleted

## 2022-09-16 DIAGNOSIS — Z7901 Long term (current) use of anticoagulants: Secondary | ICD-10-CM | POA: Diagnosis not present

## 2022-09-16 DIAGNOSIS — Z952 Presence of prosthetic heart valve: Secondary | ICD-10-CM | POA: Diagnosis not present

## 2022-09-16 DIAGNOSIS — Z5181 Encounter for therapeutic drug level monitoring: Secondary | ICD-10-CM

## 2022-09-16 LAB — POCT INR: INR: 2.7 (ref 2.0–3.0)

## 2022-09-16 NOTE — Patient Instructions (Signed)
Description   Continue on same dosage you have been taking 1/2 tablet every day EXCEPT 1 tablet on Mondays, Wednesdays and Fridays.   EAT GREENS 1 DAY PER WEEK;. [phone]  Recheck INR in 3 weeks

## 2022-09-16 NOTE — Transitions of Care (Post Inpatient/ED Visit) (Signed)
   09/16/2022  Name: Ryan [name] MRN: [id number] DOB: [birth year]  Today's TOC FU Call Status: Today's TOC FU Call Status:: Successful TOC FU Call Competed TOC FU Call Complete Date: 09/16/22  Transition Care Management Follow-up Telephone Call Date of Discharge: 09/09/22 Discharge Facility: [facility]) Type of Discharge: Emergency Department Reason for ED Visit: Other: (epistaxis) How have you been since you were released from the hospital?: Better Any questions or concerns?: No  Items Reviewed: Did you receive and understand the discharge instructions provided?: Yes Any new allergies since your discharge?: No Dietary orders reviewed?: NA Do you have support at home?: Yes People in Home: significant other Name of Support/Comfort Primary Source: [place] and Equipment/Supplies: [place] Ordered?: NA Any new equipment or medical supplies ordered?: NA  Functional Questionnaire: Do you need assistance with bathing/showering or dressing?: No Do you need assistance with meal preparation?: No Do you need assistance with eating?: No Do you have difficulty maintaining continence: No Do you need assistance with getting out of bed/getting out of a chair/moving?: No Do you have difficulty managing or taking your medications?: No  Follow up appointments reviewed: PCP Follow-up appointment confirmed?: [facility] Follow-up appointment confirmed?: Yes Date of Specialist follow-up appointment?: 09/16/22 Follow-Up Specialty Provider:: [facility] Do you need transportation to your follow-up appointment?: No Do you understand care options if your condition(s) worsen?: Yes-patient verbalized understanding  SDOH Interventions Today    Flowsheet Row Most Recent Value  SDOH Interventions   Transportation Interventions Intervention Not Indicated     The patient was given information about care management services  as a benefit of their Medicaid health plan today.   Patient                                              did not agree to enrollment in care management services and does not wish to consider at this time.   Lurena Joiner RN, BSN [facility] RN Care Coordinator [phone]

## 2022-09-23 ENCOUNTER — Other Ambulatory Visit: Payer: Self-pay | Admitting: Surgery

## 2022-09-23 DIAGNOSIS — I7100 Dissection of unspecified site of aorta: Secondary | ICD-10-CM

## 2022-09-23 DIAGNOSIS — M7541 Impingement syndrome of right shoulder: Secondary | ICD-10-CM | POA: Diagnosis not present

## 2022-09-28 ENCOUNTER — Other Ambulatory Visit: Payer: Self-pay | Admitting: *Deleted

## 2022-09-28 ENCOUNTER — Other Ambulatory Visit: Payer: Self-pay | Admitting: Surgery

## 2022-09-28 ENCOUNTER — Encounter: Payer: Self-pay | Admitting: Surgery

## 2022-09-28 DIAGNOSIS — I7100 Dissection of unspecified site of aorta: Secondary | ICD-10-CM

## 2022-09-28 MED ORDER — PREDNISONE 50 MG PO TABS: ORAL_TABLET | ORAL | 0 refills | Status: DC

## 2022-10-07 ENCOUNTER — Ambulatory Visit: Payer: Medicaid Other | Attending: Internal Medicine

## 2022-10-07 DIAGNOSIS — Z7901 Long term (current) use of anticoagulants: Secondary | ICD-10-CM

## 2022-10-07 DIAGNOSIS — Z952 Presence of prosthetic heart valve: Secondary | ICD-10-CM | POA: Diagnosis not present

## 2022-10-07 DIAGNOSIS — Z5181 Encounter for therapeutic drug level monitoring: Secondary | ICD-10-CM

## 2022-10-07 LAB — POCT INR: INR: 3.2 — AB (ref 2.0–3.0)

## 2022-10-07 NOTE — Patient Instructions (Signed)
Description   Take 1/2 tablet today, then resume same dosage you have been taking 1/2 tablet every day EXCEPT 1 tablet on Mondays, Wednesdays and Fridays.   EAT GREENS 1 DAY PER WEEK;. [phone]  Recheck INR in 3 weeks

## 2022-10-11 ENCOUNTER — Emergency Department: Payer: Medicaid Other

## 2022-10-11 ENCOUNTER — Other Ambulatory Visit: Payer: Self-pay

## 2022-10-11 ENCOUNTER — Observation Stay
Admission: EM | Admit: 2022-10-11 | Discharge: 2022-10-13 | Disposition: A | Discharge status: Home / Self Care | Payer: Medicaid Other | Attending: Obstetrics and Gynecology | Admitting: Obstetrics and Gynecology

## 2022-10-11 ENCOUNTER — Encounter: Payer: Self-pay | Admitting: Internal Medicine

## 2022-10-11 DIAGNOSIS — R519 Headache, unspecified: Secondary | ICD-10-CM | POA: Diagnosis not present

## 2022-10-11 DIAGNOSIS — I700 Atherosclerosis of aorta: Secondary | ICD-10-CM | POA: Diagnosis not present

## 2022-10-11 DIAGNOSIS — I480 Paroxysmal atrial fibrillation: Secondary | ICD-10-CM | POA: Diagnosis present

## 2022-10-11 DIAGNOSIS — R079 Chest pain, unspecified: Secondary | ICD-10-CM | POA: Diagnosis not present

## 2022-10-11 DIAGNOSIS — Z7982 Long term (current) use of aspirin: Secondary | ICD-10-CM | POA: Diagnosis not present

## 2022-10-11 DIAGNOSIS — Z8673 Personal history of transient ischemic attack (TIA), and cerebral infarction without residual deficits: Secondary | ICD-10-CM | POA: Diagnosis not present

## 2022-10-11 DIAGNOSIS — G4733 Obstructive sleep apnea (adult) (pediatric): Secondary | ICD-10-CM | POA: Diagnosis present

## 2022-10-11 DIAGNOSIS — Z79899 Other long term (current) drug therapy: Secondary | ICD-10-CM | POA: Diagnosis not present

## 2022-10-11 DIAGNOSIS — I10 Essential (primary) hypertension: Secondary | ICD-10-CM | POA: Diagnosis not present

## 2022-10-11 DIAGNOSIS — I5032 Chronic diastolic (congestive) heart failure: Secondary | ICD-10-CM | POA: Diagnosis present

## 2022-10-11 DIAGNOSIS — R001 Bradycardia, unspecified: Secondary | ICD-10-CM | POA: Diagnosis present

## 2022-10-11 DIAGNOSIS — Z7901 Long term (current) use of anticoagulants: Secondary | ICD-10-CM | POA: Insufficient documentation

## 2022-10-11 DIAGNOSIS — I130 Hypertensive heart and chronic kidney disease with heart failure and stage 1 through stage 4 chronic kidney disease, or unspecified chronic kidney disease: Secondary | ICD-10-CM | POA: Insufficient documentation

## 2022-10-11 DIAGNOSIS — I7103 Dissection of thoracoabdominal aorta: Secondary | ICD-10-CM | POA: Diagnosis present

## 2022-10-11 DIAGNOSIS — N182 Chronic kidney disease, stage 2 (mild): Secondary | ICD-10-CM | POA: Diagnosis not present

## 2022-10-11 DIAGNOSIS — R072 Precordial pain: Secondary | ICD-10-CM | POA: Diagnosis not present

## 2022-10-11 DIAGNOSIS — R000 Tachycardia, unspecified: Secondary | ICD-10-CM | POA: Diagnosis not present

## 2022-10-11 DIAGNOSIS — I7100 Dissection of unspecified site of aorta: Secondary | ICD-10-CM | POA: Diagnosis present

## 2022-10-11 DIAGNOSIS — R7301 Impaired fasting glucose: Secondary | ICD-10-CM | POA: Diagnosis present

## 2022-10-11 DIAGNOSIS — R0789 Other chest pain: Secondary | ICD-10-CM | POA: Diagnosis not present

## 2022-10-11 DIAGNOSIS — Z952 Presence of prosthetic heart valve: Secondary | ICD-10-CM

## 2022-10-11 DIAGNOSIS — I712 Thoracic aortic aneurysm, without rupture, unspecified: Secondary | ICD-10-CM | POA: Diagnosis not present

## 2022-10-11 DIAGNOSIS — I5022 Chronic systolic (congestive) heart failure: Secondary | ICD-10-CM | POA: Diagnosis present

## 2022-10-11 LAB — BASIC METABOLIC PANEL
Anion gap: 7 (ref 5–15)
BUN: 11 mg/dL (ref 6–20)
CO2: 23 mmol/L (ref 22–32)
Calcium: 8.2 mg/dL — ABNORMAL LOW (ref 8.9–10.3)
Chloride: 106 mmol/L (ref 98–111)
Creatinine, Ser: 1.01 mg/dL (ref 0.61–1.24)
GFR, Estimated: >60 mL/min (ref >60)
Glucose, Bld: 144 mg/dL — ABNORMAL HIGH (ref 70–99)
Potassium: 3.2 mmol/L — ABNORMAL LOW (ref 3.5–5.1)
Sodium: 136 mmol/L (ref 135–145)

## 2022-10-11 LAB — PROTIME-INR
INR: 2.7 — ABNORMAL HIGH (ref 0.8–1.2)
Prothrombin Time: 28.6 seconds — ABNORMAL HIGH (ref 11.4–15.2)

## 2022-10-11 LAB — CBC
HCT: 38.3 % — ABNORMAL LOW (ref 39.0–52.0)
Hemoglobin: 12.4 g/dL — ABNORMAL LOW (ref 13.0–17.0)
MCH: 31.3 pg (ref 26.0–34.0)
MCHC: 32.4 g/dL (ref 30.0–36.0)
MCV: 96.7 fL (ref 80.0–100.0)
Platelets: 135 10*3/uL — ABNORMAL LOW (ref 150–400)
RBC: 3.96 MIL/uL — ABNORMAL LOW (ref 4.22–5.81)
RDW: 13.7 % (ref 11.5–15.5)
WBC: 4.6 10*3/uL (ref 4.0–10.5)
nRBC: 0 % (ref 0.0–0.2)

## 2022-10-11 LAB — TROPONIN I (HIGH SENSITIVITY)
Troponin I (High Sensitivity): 20 ng/L — ABNORMAL HIGH (ref <18)
Troponin I (High Sensitivity): 29 ng/L — ABNORMAL HIGH (ref <18)
Troponin I (High Sensitivity): 27 ng/L — ABNORMAL HIGH (ref <18)

## 2022-10-11 MED ORDER — DIPHENHYDRAMINE HCL 25 MG PO CAPS: 50.0000 mg | ORAL_CAPSULE | Freq: Once | ORAL | Status: AC

## 2022-10-11 MED ORDER — DIPHENHYDRAMINE HCL 50 MG/ML IJ SOLN
50.0000 mg | Freq: Once | INTRAMUSCULAR | Status: AC
  Administered 2022-10-11: 50 mg via INTRAVENOUS
  Filled 2022-10-11: qty 1

## 2022-10-11 MED ORDER — ONDANSETRON HCL 4 MG/2ML IJ SOLN
4.0000 mg | Freq: Once | INTRAMUSCULAR | Status: AC
  Administered 2022-10-11: 4 mg via INTRAVENOUS
  Filled 2022-10-11: qty 2

## 2022-10-11 MED ORDER — IOHEXOL 350 MG/ML SOLN
100.0000 mL | Freq: Once | INTRAVENOUS | Status: AC | PRN
  Administered 2022-10-11: 100 mL via INTRAVENOUS

## 2022-10-11 MED ORDER — METHYLPREDNISOLONE SODIUM SUCC 40 MG IJ SOLR
40.0000 mg | Freq: Once | INTRAMUSCULAR | Status: AC
  Administered 2022-10-11: 40 mg via INTRAVENOUS
  Filled 2022-10-11: qty 1

## 2022-10-11 MED ORDER — MORPHINE SULFATE (PF) 4 MG/ML IV SOLN
4.0000 mg | Freq: Once | INTRAVENOUS | Status: AC
  Administered 2022-10-11: 4 mg via INTRAVENOUS
  Filled 2022-10-11: qty 1

## 2022-10-11 NOTE — Assessment & Plan Note (Signed)
Stable. Patient is euvolemic. Strict I's and O's. Continue patient on his aspirin, lisinopril.
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
## 2022-10-11 NOTE — Assessment & Plan Note (Signed)
Patient presenting with precordial chest pain with exertion concerning for angina. EKG shows PVCs and left bundle branch block which she had previously. Patient is followed by Dr. END cardiology for his extensive aneurysm and dissection. Will admit patient for observation and cardiology consult for his chest pain. Optimize blood pressure and keep patient n.p.o.

## 2022-10-11 NOTE — Assessment & Plan Note (Signed)
Continue patient on his Coumadin and pharmacy consult for INR management.

## 2022-10-11 NOTE — Assessment & Plan Note (Signed)
EKG currently shows sinus rhythm. Patient is currently on Coumadin.

## 2022-10-11 NOTE — H&P (Signed)
History and Physical     Patient: Ryan [name] NWG:[id number] DOB: 08/12/1965 DOA: 10/11/2022 DOS: the patient was seen and examined on 10/12/2022 PCP: Larae Grooms, NP   Patient coming from: Home  Chief Complaint: Chest pain.   HISTORY OF PRESENT ILLNESS: Ryan [name] is an 57 y.o. male seen in ed for chest pain that started on his way to work while riding on his bicycle. Patient states that he thought it was okay for him to ride on his bicycle discussed with patient that because his blood pressure is usually running on the low side that exertion with resistance like riding a bicycle or exercise puts him at risk for increased demand and making his heart work harder causing this type of a chest pain presentation although we will admit him and get cardiology to see him for his chest  pain, also patient advised to let us know overnight if he develops any kind of numbness tingling paresthesias chest discomfort pressure shortness of breath palpitation or any other symptoms.  Patient verbalized understanding. Duration:started today  Frequency: constant. Location:precordial Quality:pressure. Rate:5/10 Radiation:Rarm. Aggravating:exertion. Alleviating:None. Associated factors:no nausea or vomiting. Or diaphoresis.  When evaluated by EMS patient noted to have a left bundle branch block was given an aspirin and a spray of nitroglycerin.  In the emergency room patient reports chest pain is 3 out of 10. 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 Patient had a left heart cath in February 2022 that was said  to be normal.  Past Medical History:  Diagnosis Date   Acute cholecystitis 12/25/2021   Acute cystitis with hematuria 11/20/2020   Last Assessment & Plan:   Formatting of this note might be different from the original.  Patient completed a course of Keflex. Symptoms resolved. Denies dysuria, frequency, urgency, fevers, chills, abdominal pain or cramps.   Anemia    Aortic aneurysm and dissection (HCC)    a. 2001 s/p grafting and AVR; b. 09/2021 CTA Chest: Dil Ao root up to 51mm. Ao arch 46mm. Desc Ao 54mm. Complicated arch dissection w/ mult false and/or partially thrombosed lumens. Dissection flaps extend into all great vessels except L carotid. Dissection cont into R CCA and into abd->L RA.   Chronic anticoagulation    Chronic cholecystitis 01/31/2022   Dissecting aortic aneurysm, thoracoabdominal (HCC)    Fatty liver    GERD (gastroesophageal reflux disease)    H/O mechanical aortic valve replacement    a. 2001 s/p AVR in setting of Asc Ao aneurysm repair; b. 08/2021 Echo: EF 50-55%, no rwma, mild LVH, GrII DD, nl RV fxn, mild MR, triv AI w/ mean AoV grad .   Heart murmur    History of cardiac catheterization    a. 08/2019 MV: basal-dist lateral and inflat defect; b. 07/2020 Cath: nl cors.   Hyperlipidemia    Hypertension    Hypolipidemia    Iron deficiency    PAF (paroxysmal atrial fibrillation) (HCC)    a. CHA2DS2VASc = 3.  Chronic warfarin (also mech AVR).   Pulmonary nodule    Sleep apnea    Stroke [facility])    Review of Systems  Cardiovascular:  Positive for  chest pain.   Allergies  Allergen Reactions   Alitraq Rash   Iodinated Contrast Media Rash   Iodine Rash    CT dye CT dye  CT dye CT dye   Past Surgical History:  Procedure Laterality Date   ABDOMINAL SURGERY     AORTIC VALVE REPLACEMENT  2001   St Jude Mechanical   ARTHROSCOPIC REPAIR ACL     CHOLECYSTECTOMY  02/01/2022   COLONOSCOPY  2019   IR PERC CHOLECYSTOSTOMY  12/26/2021   MENISCUS REPAIR      MEDICATIONS: Prior to Admission medications   Medication Sig Start Date End Date Taking? Authorizing Provider  predniSONE (DELTASONE) 50 MG tablet Please take 1 tablet 13 hours, 7 hours, and then 1 hour prior to your scheduled CTA scan 10/20/22   Alleen Borne, MD  acetaminophen (TYLENOL) 500 MG tablet Take 500 mg by mouth every 6 (six) hours as needed for headache, fever, moderate pain or mild pain.    [provider]  aspirin 81 MG EC tablet Take 81 mg by mouth daily.    [provider]  Ferrous Sulfate Dried (HIGH POTENCY IRON) 65 MG TABS Take 2 tablets by mouth daily. 08/13/21   [provider]  lisinopril (ZESTRIL) 5 MG tablet Take 1 tablet by mouth once daily 07/17/22   End, Cristal Deer, MD  lovastatin (MEVACOR) 20 MG tablet TAKE 1 TABLET BY MOUTH AT BEDTIME 04/15/22   Larae Grooms, NP  oxymetazoline (AFRIN) 0.05 % nasal spray Place 1 spray into both nostrils 2 (two) times daily.    [provider]  Potassium 99 MG TABS Take 2 tablets by mouth every evening.    [provider]  warfarin (COUMADIN) 5 MG tablet Take 1/2 to one tablet by mouth once daily as instructed by anticoagulation clinic. 09/02/22   End, Cristal Deer, MD   ED Course: Pt in Ed alert awake oriented afebrile with O2 sats of 97% on room air.  Vitals:   10/11/22 2330 10/12/22 0000 10/12/22 0030 10/12/22 0050  BP: 97/65 99/63 (!) 100/59 113/68  Pulse: (!) 44 (!) 54 (!) 46 (!) 51  Resp: 19 18 18 20   Temp:    97.6 F (36.4 C)  TempSrc:    Oral  SpO2: 96% 95% 96% 99%  Weight:    79.2 kg  Height:    5\' 8"  (1.727 m)  EKG today shows sinus rhythm PVCs, LVH, QRS of 156, LBBB.       No intake/output data recorded. SpO2: 99 % O2 Flow Rate (L/min): 0 L/min Blood work in ed shows: Initial troponin of 20 repeat troponin of 29. BMP showing potassium of 3.2 glucose 144 calcium 9.2. LFTs: None. CBC normal white count hemoglobin 12.4 platelets 135. PT INR: 28.6 INR 2.7. CT  head noncontrast done today shows no acute intracranial process and a small chronic right cerebellar infarct. Patient had a CT angio of chest abdomen and pelvis showing: 1. 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 The dissection and aneurysmal dilatation of the thoracic aorta extends through the abdominal aorta (stable) and terminates at the origin of the right external iliac and distal left common iliac arteries. Dissection flap extends into the origin of the right renal artery as well as into the left renal artery with stenosis of the true lumen. Partial opacification of the false lumen from retrograde flow inferiorly at the aortic bifurcation.  Partially thrombosed false lumen of the thoracic aorta. 2.  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 3. No findings to suggest organ ischemia. 4. Recommend semi-annual imaging followup by CTA or MRA and referral to cardiothoracic surgery if not already obtained. This recommendation follows 2010 ACCF/AHA/AATS/ACR/ASA/SCA/SCAI/SIR/STS/SVM Guidelines for the Diagnosis and Management of Patients With Thoracic Aortic Disease. Circulation. 2010; 121: [id number]. Aortic aneurysm NOS (ICD10-I71.9). 5. Other imaging findings of potential clinical significance: Cardiomegaly. Status post aortic valve replacement. Colonic diverticulosis with no acute diverticulitis. Prostatomegaly. Small to moderate volume fat contained left inguinal hernia. 6. Aortic Atherosclerosis (ICD10-I70.0). Aortic aneurysm NOS (ICD10-I71.9). Per ed md she called radiology and discussed report with them and dissection is same and stable with no change.  Admission request for observation for chest pain as TNI went up mildly.  I have added on LFT/MAG/UDS/ETHANOL.  Results for orders placed or performed during the hospital  encounter of 10/11/22 (from the past 72 hour(s))  Hepatic function panel     Status: Abnormal   Collection Time: 10/11/22 10:14 AM  Result Value Ref Range   Total Protein 5.9 (L) 6.5 - 8.1 g/dL   Albumin 2.4 (L) 3.5 - 5.0 g/dL   AST 39 15 - 41 U/L   ALT 19 0 - 44 U/L   Alkaline Phosphatase 91 38 - 126 U/L   Total Bilirubin 1.6 (H) 0.3 - 1.2 mg/dL   Bilirubin, Direct 0.5 (H) 0.0 - 0.2 mg/dL   Indirect Bilirubin 1.1 (H) 0.3 - 0.9 mg/dL    Comment: Performed at [facility], [address] [place], [place] [address]  Magnesium     Status: None   Collection Time: 10/11/22 10:14 AM  Result Value Ref Range   Magnesium 1.8 1.7 - 2.4 mg/dL    Comment: Performed at [facility], [address] [place], [place] [address]  Basic metabolic panel     Status: Abnormal   Collection Time: 10/11/22  4:59 PM  Result Value Ref Range   Sodium 136 135 - 145 mmol/L   Potassium 3.2 (L) 3.5 - 5.1 mmol/L   Chloride 106 98 - 111 mmol/L   CO2 23 22 - 32 mmol/L   Glucose, Bld 144 (H) 70 - 99 mg/dL    Comment: Glucose reference range applies only to samples taken after fasting for at least 8 hours.   BUN 11 6 - 20 mg/dL   Creatinine, Ser 1.91 0.61 - 1.24 mg/dL   Calcium 8.2 (L) 8.9 - 10.3 mg/dL   GFR, Estimated >47 >82 mL/min    Comment: (NOTE) Calculated using the CKD-EPI Creatinine Equation (2021)    Anion gap 7 5 - 15    Comment: Performed at [facility], [address] [place], [place] [address]  CBC     Status: Abnormal   Collection Time: 10/11/22  4:59 PM  Result Value Ref Range   WBC 4.6 4.0 - 10.5 K/uL   RBC 3.96 (L) 4.22 - 5.81 MIL/uL   Hemoglobin 12.4 (L) 13.0 - 17.0 g/dL   HCT 30.8 (L) 65.7 - 84.6 %   MCV 96.7 80.0 - 100.0 fL   MCH 31.3 26.0 - 34.0 pg   MCHC 32.4 30.0 - 36.0 g/dL   RDW 96.2 95.2 - 84.1 %   Platelets 135 (L) 150 - 400 K/uL   nRBC 0.0 0.0 - 0.2 %    Comment: Performed at [facility], [address], [place], [place] [address]   Troponin  I (High Sensitivity)     Status: Abnormal   Collection Time: 10/11/22  4:59 PM  Result Value Ref Range   Troponin I (High Sensitivity) 20 (H) <18 ng/L    Comment: (NOTE) Elevated high sensitivity troponin I (hsTnI) values and significant  changes across serial measurements may suggest ACS but many other  chronic and acute conditions are known to elevate hsTnI results.  Refer to the "Links" section for chest pain algorithms and additional  guidance. Performed at [facility], [address] [place], [place] [address]   Troponin I (High Sensitivity)     Status: Abnormal   Collection Time: 10/11/22  7:25 PM  Result Value Ref Range   Troponin I (High Sensitivity) 29 (H) <18 ng/L    Comment: (NOTE) Elevated high sensitivity troponin I (hsTnI) values and significant  changes across serial measurements may suggest ACS but many other  chronic and acute conditions are known to elevate hsTnI results.  Refer to the "Links" section for chest pain algorithms and additional  guidance. Performed at [facility], [address] [place], [place] [address]   Protime-INR     Status: Abnormal   Collection Time: 10/11/22  7:25 PM  Result Value Ref Range   Prothrombin Time 28.6 (H) 11.4 - 15.2 seconds   INR 2.7 (H) 0.8 - 1.2    Comment: (NOTE) INR goal varies based on device and disease states. Performed at [facility], [address] [place], [place] [address]   Troponin I (High Sensitivity)     Status: Abnormal   Collection Time: 10/11/22 10:56 PM  Result Value Ref Range   Troponin I (High Sensitivity) 27 (H) <18 ng/L    Comment: (NOTE) Elevated high sensitivity troponin I (hsTnI) values and significant  changes across serial measurements may suggest ACS but many other  chronic and acute conditions are known to elevate hsTnI results.  Refer to the "Links" section for chest pain algorithms and additional  guidance. Performed at [facility], [address], [place], [place] [address]   Urine Drug Screen, Qualitative [facility] only)     Status: Abnormal   Collection Time: 10/11/22 11:45 PM  Result Value Ref Range   Tricyclic, Ur Screen NONE DETECTED NONE DETECTED   Amphetamines, Ur Screen NONE DETECTED NONE DETECTED   MDMA (Ecstasy)Ur Screen NONE DETECTED NONE DETECTED   Cocaine Metabolite,Ur [place] NONE DETECTED NONE DETECTED   Opiate, Ur Screen POSITIVE (A) NONE DETECTED   Phencyclidine (PCP) Ur S NONE DETECTED NONE DETECTED   Cannabinoid 50 Ng, Ur [place] NONE DETECTED NONE DETECTED   Barbiturates, Ur Screen NONE DETECTED NONE DETECTED   Benzodiazepine, Ur Scrn NONE DETECTED NONE DETECTED   Methadone Scn, Ur NONE DETECTED NONE DETECTED    Comment: (NOTE) Tricyclics + metabolites, urine    Cutoff 1000 ng/mL Amphetamines + metabolites, urine  Cutoff 1000 ng/mL MDMA (Ecstasy), urine              Cutoff 500 ng/mL Cocaine Metabolite, urine          Cutoff 300 ng/mL Opiate + metabolites, urine        Cutoff 300 ng/mL Phencyclidine (PCP), urine         Cutoff 25 ng/mL Cannabinoid, urine                 Cutoff 50 ng/mL Barbiturates + metabolites, urine  Cutoff 200 ng/mL Benzodiazepine, urine  Cutoff 200 ng/mL Methadone, urine                   Cutoff 300 ng/mL  The urine drug screen provides only a preliminary, unconfirmed analytical test result and should not be used for non-medical purposes. Clinical consideration and professional judgment should be applied to any positive drug screen result due to possible interfering substances. A more specific alternate chemical method must be used in order to obtain a confirmed analytical result. Gas chromatography / mass spectrometry (GC/MS) is the preferred confirm atory method. Performed at [facility], [address] [place], [place] [address]     Lab Results  Component Value Date   CREATININE 1.01 10/11/2022   CREATININE 1.04 09/09/2022   CREATININE 1.10  08/05/2022      Latest Ref Rng & Units 10/11/2022    4:59 PM 10/11/2022   10:14 AM 09/09/2022    3:33 PM  CMP  Glucose 70 - 99 mg/dL 562   130   BUN 6 - 20 mg/dL 11   15   Creatinine 8.65 - 1.24 mg/dL 7.84   6.96   Sodium 295 - 145 mmol/L 136   135   Potassium 3.5 - 5.1 mmol/L 3.2   4.3   Chloride 98 - 111 mmol/L 106   107   CO2 22 - 32 mmol/L 23   25   Calcium 8.9 - 10.3 mg/dL 8.2   8.2   Total Protein 6.5 - 8.1 g/dL  5.9    Total Bilirubin 0.3 - 1.2 mg/dL  1.6    Alkaline Phos 38 - 126 U/L  91    AST 15 - 41 U/L  39    ALT 0 - 44 U/L  19     Unresulted Labs (From admission, onward)     Start     Ordered   10/12/22 0500  Comprehensive metabolic panel  Tomorrow morning,   STAT        10/12/22 0000   10/12/22 0500  CBC  Tomorrow morning,   STAT        10/12/22 0000   10/12/22 0500  Protime-INR  Tomorrow morning,   R        10/12/22 0010   10/12/22 0000  Ethanol  Once,   R        10/12/22 0000   10/11/22 2359  HIV Antibody (routine testing w rflx)  (HIV Antibody (Routine testing w reflex) panel)  Once,   URGENT        10/12/22 0000   10/11/22 2351  Lipid panel  Add-on,   AD        10/11/22 2351   10/11/22 2351  T4, free  Add-on,   AD        10/11/22 2351   10/11/22 2351  TSH  Add-on,   AD        10/11/22 2351   10/11/22 2339  Hemoglobin A1c  Add-on,   AD        10/11/22 2338           Pt has received : Orders Placed This Encounter  Procedures   1-3 Lead EKG Interpretation    This order was created via procedure documentation    Standing Status:   Standing    Number of Occurrences:   1   DG Chest 2 View    If patient pregnant, contact provider.    Standing Status:   Standing    Number  of Occurrences:   1    Order Specific Question:   Reason for Exam (SYMPTOM  OR DIAGNOSIS REQUIRED)    Answer:   cp    Order Specific Question:   Radiology Contrast Protocol - do NOT remove file path    Answer:    [email or web address]   CT HEAD WO CONTRAST ( )    Standing Status:   Standing    Number of Occurrences:   1   CT Angio Chest/Abd/Pel for Dissection W and/or Wo Contrast    Standing Status:   Standing    Number of Occurrences:   1    Order Specific Question:   Bypass Screening Labs?    Answer:   I, as the ordering provider, have weighed the risks vs. benefits for this patient and concluded this exam meets the requirements for medical necessity to bypass lab work needed prior to CT.    Order Specific Question:   Does the patient have a contrast media/X-ray dye allergy?    Answer:   Yes    Order Specific Question:   If indicated for the ordered procedure, I authorize the administration of contrast media per Radiology protocol    Answer:   Yes    Order Specific Question:   Radiology Contrast Protocol - do NOT remove file path    Answer:   [email or web address]   Basic metabolic panel    Standing Status:   Standing    Number of Occurrences:   1   CBC    Standing Status:   Standing    Number of Occurrences:   1   Protime-INR    Standing Status:   Standing    Number of Occurrences:   1   Hepatic function panel    Standing Status:   Standing    Number of Occurrences:   1   Urine Drug Screen, Qualitative ([facility] only)    Standing Status:   Standing    Number of Occurrences:   1   Magnesium    Standing Status:   Standing    Number of Occurrences:   1   Hemoglobin A1c    Standing Status:   Standing    Number of Occurrences:   1   Ethanol    Standing Status:   Standing    Number of Occurrences:   1   Lipid panel    Standing Status:   Standing    Number of Occurrences:   1   T4, free    Standing Status:   Standing    Number of Occurrences:   1   TSH    Standing Status:   Standing    Number of Occurrences:   1   HIV Antibody (routine testing w rflx)    Standing Status:   Standing    Number of  Occurrences:   1   Comprehensive metabolic panel    Standing Status:   Standing    Number of Occurrences:   1   CBC    Standing Status:   Standing    Number of Occurrences:   1   Protime-INR    Standing Status:   Standing    Number of Occurrences:   1   Diet NPO time specified Except for: Sips with Meds    Standing Status:   Standing    Number of Occurrences:   1    Order Specific Question:   Except for    Answer:   Sips with Meds  Document Height and Actual Weight    Use scales to weigh patient, not stated or estimated weight.    Standing Status:   Standing    Number of Occurrences:   1   Maintain IV access    Standing Status:   Standing    Number of Occurrences:   1   Vital signs    Standing Status:   Standing    Number of Occurrences:   1   Notify physician (specify)    Standing Status:   Standing    Number of Occurrences:   20    Order Specific Question:   Notify Physician    Answer:   for pulse less than 55 or greater than 120    Order Specific Question:   Notify Physician    Answer:   for respiratory rate less than 12 or greater than 25    Order Specific Question:   Notify Physician    Answer:   for temperature greater than 100.5 F    Order Specific Question:   Notify Physician    Answer:   for urinary output less than 30 mL/hr for four hours    Order Specific Question:   Notify Physician    Answer:   for systolic BP less than 90 or greater than 160, diastolic BP less than 60 or greater than 100    Order Specific Question:   Notify Physician    Answer:   for new hypoxia w/ oxygen saturations < 88%   Progressive Mobility Protocol: No Restrictions    Standing Status:   Standing    Number of Occurrences:   1   Daily weights    Standing Status:   Standing    Number of Occurrences:   1   Intake and Output    Standing Status:   Standing    Number of Occurrences:   1   Do not place and if present remove PureWick    Standing Status:   Standing    Number of  Occurrences:   1   Initiate Oral Care Protocol    Standing Status:   Standing    Number of Occurrences:   1   Initiate Carrier Fluid Protocol    Standing Status:   Standing    Number of Occurrences:   1   RN may order General Admission PRN Orders utilizing "General Admission PRN medications" (through manage orders) for the following patient needs: allergy symptoms (Claritin), cold sores (Carmex), cough (Robitussin DM), eye irritation (Liquifilm Tears), hemorrhoids (Tucks), indigestion (Maalox), minor skin irritation (Hydrocortisone Cream), muscle pain (Ben Gay), nose irritation (saline nasal spray) and sore throat (Chloraseptic spray).    Standing Status:   Standing    Number of Occurrences:   [id number]   Cardiac Monitoring Continuous x 12 hours Indications for use: Other; other indications for use: chest pain.    Standing Status:   Standing    Number of Occurrences:   1    Order Specific Question:   Indications for use:    Answer:   Other    Order Specific Question:   other indications for use:    Answer:   chest pain.   Full code    Standing Status:   Standing    Number of Occurrences:   1    Order Specific Question:   By:    Answer:   Other   Consult to hospitalist    Standing Status:   Standing  Number of Occurrences:   1    Order Specific Question:   Place call to:    Answer:   [id number]    Order Specific Question:   Reason for Consult    Answer:   Admit   Inpatient consult to Cardiology [facility] only - Select consulting group: [facility]; Consult Timeframe: ROUTINE - requires response within 24 hours; Reason for Consult? chest pain    Standing Status:   Standing    Number of Occurrences:   1    Order Specific Question:   [facility] only - Select consulting group    Answer:   [facility]    Order Specific Question:   Consult Timeframe    Answer:   ROUTINE - requires response within 24 hours    Order Specific Question:   Reason for Consult?    Answer:   chest pain   warfarin (COUMADIN) per pharmacy  consult    Standing Status:   Standing    Number of Occurrences:   1    Order Specific Question:   Indication:    Answer:   Mechanical Valve   Pulse oximetry check with vital signs    Standing Status:   Standing    Number of Occurrences:   1   Oxygen therapy Mode or (Route): Nasal cannula; Liters Per Minute: 2; Keep 02 saturation: greater than 92 %    Standing Status:   Standing    Number of Occurrences:   20    Order Specific Question:   Mode or (Route)    Answer:   Nasal cannula    Order Specific Question:   Liters Per Minute    Answer:   2    Order Specific Question:   Keep 02 saturation    Answer:   greater than 92 %   ED EKG    Standing Status:   Standing    Number of Occurrences:   1    Order Specific Question:   Reason for Exam    Answer:   Chest Pain   EKG 12-Lead    Standing Status:   Standing    Number of Occurrences:   1   Place in observation (patient's expected length of stay will be less than 2 midnights)    Standing Status:   Standing    Number of Occurrences:   1    Order Specific Question:   Hospital Area    Answer:   [facility] [[id number]]    Order Specific Question:   Level of Care    Answer:   Telemetry Cardiac [103]    Order Specific Question:   Covid Evaluation    Answer:   Asymptomatic - no recent exposure (last 10 days) testing not required    Order Specific Question:   Diagnosis    Answer:   Chest pain [[id number]]    Order Specific Question:   Admitting Physician    Answer:   Darrold Junker    Order Specific Question:   Attending Physician    Answer:   Darrold Junker   Aspiration precautions    Standing Status:   Standing    Number of Occurrences:   1   Fall precautions    Standing Status:   Standing    Number of Occurrences:   1    Meds ordered this encounter  Medications   morphine (PF) 4 MG/ML injection 4 mg   ondansetron (ZOFRAN) injection 4 mg  methylPREDNISolone sodium succinate (SOLU-MEDROL) 40 mg/mL  injection 40 mg    IV methylprednisolone will be converted to either a q12h or q24h frequency with the same total daily dose (TDD).  Ordered Dose: 1 to 125 mg TDD; convert to: TDD q24h.  Ordered Dose: 126 to 250 mg TDD; convert to: TDD div q12h.  Ordered Dose: >250 mg TDD; DAW.   OR Linked Order Group    diphenhydrAMINE (BENADRYL) capsule 50 mg    diphenhydrAMINE (BENADRYL) injection 50 mg   iohexol (OMNIPAQUE) 350 MG/ML injection 100 mL   aspirin EC tablet 81 mg   lisinopril (ZESTRIL) tablet 5 mg   pravastatin (PRAVACHOL) tablet 20 mg   sodium chloride flush (NS) 0.9 % injection 3 mL   0.9 %  sodium chloride infusion   OR Linked Order Group    acetaminophen (TYLENOL) tablet 650 mg    acetaminophen (TYLENOL) suppository 650 mg   morphine (PF) 2 MG/ML injection 1 mg   warfarin (COUMADIN) tablet 2.5 mg   warfarin (COUMADIN) tablet 5 mg   Warfarin - Pharmacist Dosing Inpatient    Admission Imaging : CT Angio Chest/Abd/Pel for Dissection W and/or Wo Contrast  Addendum Date: 10/11/2022   ADDENDUM REPORT: 10/11/2022 23:23 ADDENDUM: These results were called by telephone at the time of interpretation on 10/11/2022 at 10:55 pm to provider [facility] , who verbally acknowledged these results. Electronically Signed   By: Tish Frederickson M.D.   On: 10/11/2022 23:23   Result Date: 10/11/2022 CLINICAL DATA:  rule out dissection prior h/o EXAM: CT ANGIOGRAPHY CHEST, ABDOMEN AND PELVIS TECHNIQUE: Non-contrast CT of the chest was initially obtained. Multidetector CT imaging through the chest, abdomen and pelvis was performed using the standard protocol during bolus administration of intravenous contrast. Multiplanar reconstructed images and MIPs were obtained and reviewed to evaluate the vascular anatomy. RADIATION DOSE REDUCTION: This exam was performed according to the departmental dose-optimization program which includes automated exposure control, adjustment of the mA and/or kV according to patient  size and/or use of iterative reconstruction technique. CONTRAST:  OMNIPAQUE IOHEXOL 350 MG/ML SOLN COMPARISON:  CT angio chest 09/28/2021, CT abdomen pelvis 01/31/2022 FINDINGS: CTA CHEST FINDINGS Cardiovascular: Preferential opacification of the thoracic aorta. 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 Status post aortic valve replacement. 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 The dissection and aneurysmal dilatation of the thoracic aorta extends through the abdominal aorta and terminates at the origin of the right external iliac and distal left common iliac arteries. Thoracic aortic arch measures up to 4.4 cm. The descending thoracic aorta measures up to 5.2 x 5.4 cm. Partial opacification of the false lumen from retrograde flow inferiorly at the aortic bifurcation. Partially thrombosed false lumen of the thoracic aorta. Enlarged heart size. No significant pericardial effusion. Severe atherosclerotic plaque of the thoracic aorta. No coronary artery calcifications. Mediastinum/Nodes: No enlarged mediastinal, hilar, or axillary lymph nodes. Thyroid gland, trachea, and esophagus demonstrate no significant findings. Lungs/Pleura: No focal consolidation. No pulmonary nodule. No pulmonary mass. No pleural effusion. No pneumothorax. Musculoskeletal: Sternotomy wires are intact. No suspicious lytic or blastic osseous lesions. No acute displaced fracture. Multilevel degenerative changes of the spine. Review of the MIP images confirms the above findings. CTA ABDOMEN AND PELVIS FINDINGS VASCULAR Aorta: The dissection and aneurysmal dilatation of the thoracic aorta extends through the abdominal aorta (stable) and terminates at the origin of  the right external iliac and distal left common iliac arteries. Suprarenal abdominal aorta measures up to 3.7 x 3.2 cm.  Infrarenal abdominal aorta measures up to 3 x 2.7 cm. Severe atherosclerotic plaque. Celiac: Originates from the true lumen. Patent without evidence of aneurysm, dissection, vasculitis or significant stenosis. SMA: Originates from the true lumen. Patent without evidence of aneurysm, dissection, vasculitis or significant stenosis. Renals: The dissection flap extends into the origin of the right renal artery. The dissection flap extends into the left renal artery with a stenosis true lumen. No aneurysm. IMA: Originates from the true lumen. Patent without evidence of aneurysm, dissection, vasculitis or significant stenosis. Inflow: Dissection flap extends into the right common and for origin of the right external iliac artery. Dissection flap extends into the left common iliac artery. Otherwise patent without evidence of aneurysm, dissection, vasculitis or significant stenosis. Veins: No obvious venous abnormality within the limitations of this arterial phase study. Review of the MIP images confirms the above findings. NON-VASCULAR Hepatobiliary: No focal liver abnormality. Status post cholecystectomy. No biliary dilatation. Pancreas: No focal lesion. Normal pancreatic contour. No surrounding inflammatory changes. No main pancreatic ductal dilatation. Spleen: Normal in size without focal abnormality.  Fluid Adrenals/Urinary Tract: No adrenal nodule bilaterally. Bilateral kidneys enhance symmetrically. Density lesions likely represent simple renal cysts. No hydronephrosis. No hydroureter. The urinary bladder is unremarkable. Stomach/Bowel: Stomach is within normal limits. No evidence of bowel wall thickening or dilatation. Colonic diverticulosis. Appendix appears normal. Lymphatic: No lymphadenopathy. Reproductive: The prostate is enlarged measuring up to 4.9 cm. Other: Nonspecific misty mesentery of the small bowel mesentery. No intraperitoneal free fluid. No intraperitoneal free gas. No organized fluid collection.  Musculoskeletal: Small to moderate volume fat contained left inguinal hernia. No suspicious lytic or blastic osseous lesions. No acute displaced fracture. Severe L5-S1 degenerative changes with posterior disc osteophyte complex formation. Review of the MIP images confirms the above findings. IMPRESSION: 1. 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 The dissection and aneurysmal dilatation of the thoracic aorta extends through the abdominal aorta (stable) and terminates at the origin of the right external iliac and distal left common iliac arteries. Dissection flap extends into the origin of the right renal artery as well as into the left renal artery with stenosis of the true lumen. Partial opacification of the false lumen from retrograde flow inferiorly at the aortic bifurcation. Partially thrombosed false lumen of the thoracic aorta. 2.  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 3. No findings to suggest organ ischemia. 4. Recommend semi-annual imaging followup by CTA or MRA and referral to cardiothoracic surgery if not already obtained. This recommendation follows 2010 ACCF/AHA/AATS/ACR/ASA/SCA/SCAI/SIR/STS/SVM Guidelines for the Diagnosis and Management of Patients With Thoracic Aortic Disease. Circulation. 2010; 121: [id number]. Aortic aneurysm NOS (ICD10-I71.9). 5. Other imaging findings of potential clinical significance: Cardiomegaly. Status post aortic valve replacement. Colonic diverticulosis with no acute diverticulitis. Prostatomegaly. Small to moderate volume fat contained left inguinal hernia. 6. Aortic Atherosclerosis (ICD10-I70.0). Aortic aneurysm NOS (ICD10-I71.9). Electronically Signed: By: Tish Frederickson M.D. On: 10/11/2022 22:37   CT HEAD WO CONTRAST ( )  Result Date: 10/11/2022 CLINICAL DATA:  Headache EXAM: CT HEAD WITHOUT CONTRAST  TECHNIQUE: Contiguous axial images were obtained from the base of the skull through the vertex without intravenous contrast. RADIATION DOSE REDUCTION: This exam was performed according to the departmental dose-optimization program which includes automated exposure control, adjustment of the  mA and/or kV according to patient size and/or use of iterative reconstruction technique. COMPARISON:  MRI head 05/25/2022.  CT head 05/25/2022. FINDINGS: Brain: No evidence of acute infarction, hemorrhage, hydrocephalus, extra-axial collection or mass lesion/mass effect. Small chronic infarct in the right cerebellum is unchanged. Vascular: No hyperdense vessel or unexpected calcification. Skull: Normal. Negative for fracture or focal lesion. Sinuses/Orbits: There are small polyps or mucous retention cysts in the left maxillary sinus, unchanged. No air-fluid levels. Orbits and visualized mastoid air cells are within normal limits. Other: None. IMPRESSION: 1. No acute intracranial process. 2. Small chronic right cerebellar infarct. Electronically Signed   By: Darliss Cheney M.D.   On: 10/11/2022 22:21   DG Chest 2 View  Result Date: 10/11/2022 CLINICAL DATA:  Chest pain. EXAM: CHEST - 2 VIEW COMPARISON:  August 05, 2022 chest x-ray FINDINGS: The heart size and mediastinal contours are within normal limits. Both lungs are clear. The visualized skeletal structures are unremarkable. IMPRESSION: No active cardiopulmonary disease. Electronically Signed   By: Gerome Sam III M.D.   On: 10/11/2022 17:21   Physical Examination: Vitals:   10/11/22 2330 10/12/22 0000 10/12/22 0030 10/12/22 0050  BP: 97/65 99/63 (!) 100/59 113/68  Pulse: (!) 44 (!) 54 (!) 46 (!) 51  Temp:    97.6 F (36.4 C)  Resp: 19 18 18 20   Height:    5\' 8"  (1.727 m)  Weight:    79.2 kg  SpO2: 96% 95% 96% 99%  TempSrc:    Oral  BMI (Calculated):    26.55   Physical Exam Vitals and nursing note reviewed.  Constitutional:      General: He is not  in acute distress.    Appearance: Normal appearance. He is not ill-appearing, toxic-appearing or diaphoretic.  HENT:     Head: Normocephalic and atraumatic.     Right Ear: Hearing and external ear normal.     Left Ear: Hearing and external ear normal.     Nose: Nose normal. No nasal deformity.     Mouth/Throat:     Lips: Pink.     Mouth: Mucous membranes are moist.     Tongue: No lesions.     Pharynx: Oropharynx is clear.  Eyes:     Extraocular Movements: Extraocular movements intact.  Neck:     Vascular: No carotid bruit.  Cardiovascular:     Rate and Rhythm: Normal rate and regular rhythm.     Pulses: Normal pulses.     Heart sounds: Normal heart sounds.  Pulmonary:     Effort: Pulmonary effort is normal.     Breath sounds: Normal breath sounds.  Abdominal:     General: Bowel sounds are normal. There is no distension.     Palpations: Abdomen is soft. There is no mass.     Tenderness: There is no abdominal tenderness. There is no guarding.     Hernia: No hernia is present.  Musculoskeletal:     Right lower leg: No edema.     Left lower leg: No edema.  Skin:    General: Skin is warm.  Neurological:     General: No focal deficit present.     Mental Status: He is alert and oriented to person, place, and time.     Cranial Nerves: Cranial nerves 2-12 are intact.     Motor: Motor function is intact.  Psychiatric:        Attention and Perception: Attention normal.        Mood  and Affect: Mood normal.        Speech: Speech normal.        Behavior: Behavior normal. Behavior is cooperative.        Cognition and Memory: Cognition normal.     Assessment and Plan: * Chest pain Patient presenting with precordial chest pain with exertion concerning for angina. EKG shows PVCs and left bundle branch block which she had previously. Patient is followed by Dr. END cardiology for his extensive aneurysm and dissection. Will admit patient for observation and cardiology consult for his  chest pain. Optimize blood pressure and keep patient n.p.o.  Paroxysmal atrial fibrillation (HCC) EKG currently shows sinus rhythm. Patient is currently on Coumadin.   Dissecting aortic aneurysm, thoracoabdominal (HCC) Patient is currently followed with Dr. Okey Dupre Patient has an extensive aneurysm from his aorta to his iliac arteries. Optimize blood pressure control. Continue patient on lisinopril and aspirin 81. Cardiology consulted.   Essential hypertension Vitals:   10/11/22 1657 10/11/22 1909 10/11/22 1930 10/11/22 2000  BP: 102/67 110/62 103/69 101/64   10/11/22 2030 10/11/22 2100 10/11/22 2130 10/11/22 2248  BP: 104/64 121/77 106/61 94/68   10/11/22 2300 10/11/22 2330  BP: 97/60 97/65  We will continue patient on his lisinopril at current dose.   Chronic heart failure with preserved ejection fraction (HFpEF) (HCC) Stable. Patient is euvolemic. Strict I's and O's. Continue patient on his aspirin, lisinopril.  History of CVA (cerebrovascular accident) Patient currently on aspirin and Coumadin no changes. Pharmacy consult for Coumadin management.  Impaired fasting glucose Will check an A1c.  History of heart valve replacement with mechanical valve Continue patient on his Coumadin and pharmacy consult for INR management.    DVT prophylaxis:  COUMADIN.  Code Status:  Full code.     10/11/2022    4:58 PM  Advanced Directives  Does Patient Have a Medical Advance Directive? No    Family Communication:  None. Emergency Contact: Contact Information     Name Relation Home Work Mobile   Marcelino Freestone   [phone]       Disposition Plan:  Home.  Consults: Cardiology consult: Dr.Gollan.  Admission status: Observation.  Unit / Expected LOS: Med tele/ 1 day.  Gertha Calkin MD [facility]  6 PM- 2 AM. [phone]( Pager )  For questions regarding this patient please use [email or web address] to contact the current [facility] MD.   Bonita Quin may also  call [phone] to contact current Assigned [facility] Attending/Consulting MD for this patient.

## 2022-10-11 NOTE — ED Triage Notes (Signed)
First Nurse Note:  Pt via EMS from home. Pt states he has been having chest pain on exertion. Pt has a hx of LBBB  5/10 pressure radiates to R arm EMS have 324 ASA and 1 Spray of Nitro  100% on RA 80 HR

## 2022-10-11 NOTE — Assessment & Plan Note (Signed)
Vitals:   10/11/22 1657 10/11/22 1909 10/11/22 1930 10/11/22 2000  BP: 102/67 110/62 103/69 101/64   10/11/22 2030 10/11/22 2100 10/11/22 2130 10/11/22 2248  BP: 104/64 121/77 106/61 94/68   10/11/22 2300 10/11/22 2330  BP: 97/60 97/65  We will continue patient on his lisinopril at current dose.

## 2022-10-11 NOTE — Assessment & Plan Note (Signed)
Patient currently on aspirin and Coumadin no changes. Pharmacy consult for Coumadin management.

## 2022-10-11 NOTE — ED Provider Notes (Addendum)
[facility] Provider Note    Event Date/Time   First MD Initiated Contact with Patient 10/11/22 1724     (approximate)   History   Chest Pain and Shortness of Breath   HPI  Ryan [name] is a 57 y.o. male with history of aortic dissection who comes in with chest pain and shortness of breath.  Patient reports that he was biking to work when he had sudden onset of chest pain with tingling down his right arm, shortness of breath, difficulty focusing his eyes.  He stopped riding his bike and called EMS.  When EMS got there he was noted to have a left bundle branch block and was given 324 of aspirin and 1 spray of nitro.  He reports that since then his symptoms have gotten better he denies any numbness or tingling he reports that his pain is currently a 3 out of 10 more of a pressure in the middle of his chest.  He denies any abdominal pain.  Does report a little bit of a headache as well.  Physical Exam   Triage Vital Signs: ED Triage Vitals [10/11/22 1657]  Enc Vitals Group     BP 102/67     Pulse Rate 75     Resp 18     Temp 98.8 F (37.1 C)     Temp src      SpO2 94 %     Weight 160 lb (72.6 kg)     Height 5\' 8"  (1.727 m)     Head Circumference      Peak Flow      Pain Score 5     Pain Loc      Pain Edu?      Excl. in GC?     Most recent vital signs: Vitals:   10/11/22 1657  BP: 102/67  Pulse: 75  Resp: 18  Temp: 98.8 F (37.1 C)  SpO2: 94%     General: Awake, no distress.  CV:  Good peripheral perfusion.  Resp:  Normal effort.  Abd:  No distention.  Other:  Patient has good distal pulses throughout.  He is got no numbness or tingling or weakness noted.  Abdomen soft nontender.   ED Results / Procedures / Treatments   Labs (all labs ordered are listed, but only abnormal results are displayed) Labs Reviewed  BASIC METABOLIC PANEL - Abnormal; Notable for the following components:      Result Value   Potassium 3.2 (*)    Glucose,  Bld 144 (*)    Calcium 8.2 (*)    All other components within normal limits  CBC - Abnormal; Notable for the following components:   RBC 3.96 (*)    Hemoglobin 12.4 (*)    HCT 38.3 (*)    Platelets 135 (*)    All other components within normal limits  TROPONIN I (HIGH SENSITIVITY) - Abnormal; Notable for the following components:   Troponin I (High Sensitivity) 20 (*)    All other components within normal limits     EKG  My interpretation of EKG:  Sinus rate 65 without any ST elevation but does have a left bundle branch block with some T wave inversions  RADIOLOGY I have reviewed the xray personally and interpreted no evidence of any pneumonia  PROCEDURES:  Critical Care performed: No  .1-3 Lead EKG Interpretation  Performed by: Concha Se, MD Authorized by: Concha Se, MD     Interpretation:  normal     ECG rate:  75   ECG rate assessment: normal     Rhythm: sinus rhythm     Ectopy: none     Conduction: normal      MEDICATIONS ORDERED IN ED: Medications  morphine (PF) 4 MG/ML injection 4 mg (has no administration in time range)  ondansetron (ZOFRAN) injection 4 mg (has no administration in time range)  methylPREDNISolone sodium succinate (SOLU-MEDROL) 40 mg/mL injection 40 mg (has no administration in time range)  diphenhydrAMINE (BENADRYL) capsule 50 mg (has no administration in time range)    Or  diphenhydrAMINE (BENADRYL) injection 50 mg (has no administration in time range)     IMPRESSION / MDM / ASSESSMENT AND PLAN / ED COURSE  I reviewed the triage vital signs and the nursing notes.   Patient's presentation is most consistent with acute presentation with potential threat to life or bodily function.   Differential is ACS, dissection, pneumonia, pneumothorax.  Patient given some IV morphine to help with symptoms.  BMP reassuring.  CBC shows hemoglobin around baseline.  Troponin slightly elevated but similar to prior.  Reevaluated patient he  states that his pain is gone down significantly.  He states that he typically has some intermittent episodes of chest pain that what he is experiencing now feels like his baseline.  He states that it is a sharp stabbing pain that comes occasionally but the pain that he had earlier with biking he states that he does sometimes get that he would like to just to be safe and come in and get it evaluated.  He does report having it previously.  He does follow with Dr. Okey Dupre from cardiology.  We discussed admission versus going home given his risk factors.   Troponin rose from 20-28 given the increasing delta more than 5 we discussed admission versus going home patient felt comfortable being admitted.  I think this is more reasonable just given how high risk he has for his chest pain especially given it was exertional in nature.  His CT imaging was otherwise reassuring and showed stable aneurysm, dissection.  I did discuss with the radiologist just to confirm and they stated that it looks stable in nature.  I did discuss the incidental findings with patient as well and need for follow-up.   The patient is on the cardiac monitor to evaluate for evidence of arrhythmia and/or significant heart rate changes.      FINAL CLINICAL IMPRESSION(S) / ED DIAGNOSES   Final diagnoses:  Chest pain, unspecified type     Rx / DC Orders   ED Discharge Orders     None        Note:  This document was prepared using Dragon voice recognition software and may include unintentional dictation errors.   Concha Se, MD 10/11/22 2315    Concha Se, MD 10/11/22 [phone]

## 2022-10-11 NOTE — Assessment & Plan Note (Signed)
Will check an A1c

## 2022-10-11 NOTE — Assessment & Plan Note (Signed)
Patient is currently followed with Dr. Okey Dupre Patient has an extensive aneurysm from his aorta to his iliac arteries. Optimize blood pressure control. Continue patient on lisinopril and aspirin 81. Cardiology consulted.

## 2022-10-12 ENCOUNTER — Observation Stay (HOSPITAL_BASED_OUTPATIENT_CLINIC_OR_DEPARTMENT_OTHER)
Admit: 2022-10-12 | Discharge: 2022-10-12 | Disposition: A | Discharge status: Home / Self Care | Payer: Medicaid Other | Attending: Physician Assistant | Admitting: Physician Assistant

## 2022-10-12 ENCOUNTER — Encounter: Payer: Self-pay | Admitting: Internal Medicine

## 2022-10-12 DIAGNOSIS — R079 Chest pain, unspecified: Secondary | ICD-10-CM

## 2022-10-12 DIAGNOSIS — Z952 Presence of prosthetic heart valve: Secondary | ICD-10-CM

## 2022-10-12 DIAGNOSIS — R0789 Other chest pain: Secondary | ICD-10-CM | POA: Diagnosis not present

## 2022-10-12 LAB — COMPREHENSIVE METABOLIC PANEL
ALT: 18 U/L (ref 0–44)
AST: 20 U/L (ref 15–41)
Albumin: 3.6 g/dL (ref 3.5–5.0)
Alkaline Phosphatase: 55 U/L (ref 38–126)
Anion gap: 4 — ABNORMAL LOW (ref 5–15)
BUN: 14 mg/dL (ref 6–20)
CO2: 24 mmol/L (ref 22–32)
Calcium: 8.4 mg/dL — ABNORMAL LOW (ref 8.9–10.3)
Chloride: 106 mmol/L (ref 98–111)
Creatinine, Ser: 0.98 mg/dL (ref 0.61–1.24)
GFR, Estimated: >60 mL/min (ref >60)
Glucose, Bld: 162 mg/dL — ABNORMAL HIGH (ref 70–99)
Potassium: 4 mmol/L (ref 3.5–5.1)
Sodium: 134 mmol/L — ABNORMAL LOW (ref 135–145)
Total Bilirubin: 0.9 mg/dL (ref 0.3–1.2)
Total Protein: 6.4 g/dL — ABNORMAL LOW (ref 6.5–8.1)

## 2022-10-12 LAB — CBC
HCT: 37.8 % — ABNORMAL LOW (ref 39.0–52.0)
Hemoglobin: 12.5 g/dL — ABNORMAL LOW (ref 13.0–17.0)
MCH: 31.3 pg (ref 26.0–34.0)
MCHC: 33.1 g/dL (ref 30.0–36.0)
MCV: 94.5 fL (ref 80.0–100.0)
Platelets: 145 10*3/uL — ABNORMAL LOW (ref 150–400)
RBC: 4 MIL/uL — ABNORMAL LOW (ref 4.22–5.81)
RDW: 13.5 % (ref 11.5–15.5)
WBC: 4.9 10*3/uL (ref 4.0–10.5)
nRBC: 0 % (ref 0.0–0.2)

## 2022-10-12 LAB — ECHOCARDIOGRAM COMPLETE
AR max vel: 3.1 cm2
AV Area VTI: 3.21 cm2
AV Area mean vel: 3.1 cm2
AV Mean grad: 9.5 mmHg
AV Peak grad: 17.6 mmHg
Ao pk vel: 2.1 m/s
Area-P 1/2: 2.75 cm2
Calc EF: 49 %
Height: 68 in
MV VTI: 3.95 cm2
S' Lateral: 4.2 cm
Single Plane A2C EF: 52.6 %
Single Plane A4C EF: 42.7 %
Weight: 2793.6 oz

## 2022-10-12 LAB — HIV ANTIBODY (ROUTINE TESTING W REFLEX): HIV Screen 4th Generation wRfx: NONREACTIVE

## 2022-10-12 LAB — HEPATIC FUNCTION PANEL
ALT: 19 U/L (ref 0–44)
AST: 39 U/L (ref 15–41)
Albumin: 2.4 g/dL — ABNORMAL LOW (ref 3.5–5.0)
Alkaline Phosphatase: 91 U/L (ref 38–126)
Bilirubin, Direct: 0.5 mg/dL — ABNORMAL HIGH (ref 0.0–0.2)
Indirect Bilirubin: 1.1 mg/dL — ABNORMAL HIGH (ref 0.3–0.9)
Total Bilirubin: 1.6 mg/dL — ABNORMAL HIGH (ref 0.3–1.2)
Total Protein: 5.9 g/dL — ABNORMAL LOW (ref 6.5–8.1)

## 2022-10-12 LAB — LIPID PANEL
Cholesterol: 78 mg/dL (ref 0–200)
HDL: 30 mg/dL — ABNORMAL LOW (ref >40)
LDL Cholesterol: 40 mg/dL (ref 0–99)
Total CHOL/HDL Ratio: 2.6 RATIO
Triglycerides: 41 mg/dL (ref <150)
VLDL: 8 mg/dL (ref 0–40)

## 2022-10-12 LAB — PROTIME-INR
INR: 2.7 — ABNORMAL HIGH (ref 0.8–1.2)
Prothrombin Time: 28.6 seconds — ABNORMAL HIGH (ref 11.4–15.2)

## 2022-10-12 LAB — HEMOGLOBIN A1C
Hgb A1c MFr Bld: 5.2 % (ref 4.8–5.6)
Mean Plasma Glucose: 102.54 mg/dL

## 2022-10-12 LAB — URINE DRUG SCREEN, QUALITATIVE (ARMC ONLY)
Amphetamines, Ur Screen: NOT DETECTED
Barbiturates, Ur Screen: NOT DETECTED
Benzodiazepine, Ur Scrn: NOT DETECTED
Cannabinoid 50 Ng, Ur ~~LOC~~: NOT DETECTED
Cocaine Metabolite,Ur ~~LOC~~: NOT DETECTED
MDMA (Ecstasy)Ur Screen: NOT DETECTED
Methadone Scn, Ur: NOT DETECTED
Opiate, Ur Screen: POSITIVE — AB
Phencyclidine (PCP) Ur S: NOT DETECTED
Tricyclic, Ur Screen: NOT DETECTED

## 2022-10-12 LAB — ETHANOL: Alcohol, Ethyl (B): <10 mg/dL (ref <10)

## 2022-10-12 LAB — MAGNESIUM: Magnesium: 1.8 mg/dL (ref 1.7–2.4)

## 2022-10-12 LAB — T4, FREE: Free T4: 0.81 ng/dL (ref 0.61–1.12)

## 2022-10-12 LAB — TROPONIN I (HIGH SENSITIVITY): Troponin I (High Sensitivity): 20 ng/L — ABNORMAL HIGH (ref <18)

## 2022-10-12 LAB — TSH: TSH: 0.749 u[IU]/mL (ref 0.350–4.500)

## 2022-10-12 MED ORDER — WARFARIN SODIUM 5 MG PO TABS
5.0000 mg | ORAL_TABLET | ORAL | Status: DC
  Administered 2022-10-12: 5 mg via ORAL
  Filled 2022-10-12: qty 1

## 2022-10-12 MED ORDER — WARFARIN - PHARMACIST DOSING INPATIENT
Freq: Every day | Status: DC
  Filled 2022-10-12: qty 1

## 2022-10-12 MED ORDER — ACETAMINOPHEN 650 MG RE SUPP: 650.0000 mg | Freq: Four times a day (QID) | RECTAL | Status: DC | PRN

## 2022-10-12 MED ORDER — ASPIRIN 81 MG PO TBEC
81.0000 mg | DELAYED_RELEASE_TABLET | Freq: Every day | ORAL | Status: DC
  Administered 2022-10-12 – 2022-10-13 (×2): 81 mg via ORAL
  Filled 2022-10-12 (×2): qty 1

## 2022-10-12 MED ORDER — WARFARIN SODIUM 2.5 MG PO TABS
2.5000 mg | ORAL_TABLET | ORAL | Status: DC
  Administered 2022-10-12: 2.5 mg via ORAL
  Filled 2022-10-12 (×3): qty 1

## 2022-10-12 MED ORDER — MORPHINE SULFATE (PF) 2 MG/ML IV SOLN: 1.0000 mg | INTRAVENOUS | Status: DC | PRN

## 2022-10-12 MED ORDER — SODIUM CHLORIDE 0.9 % IV SOLN: INTRAVENOUS | Status: AC

## 2022-10-12 MED ORDER — ACETAMINOPHEN 325 MG PO TABS: 650.0000 mg | ORAL_TABLET | Freq: Four times a day (QID) | ORAL | Status: DC | PRN

## 2022-10-12 MED ORDER — LISINOPRIL 5 MG PO TABS
5.0000 mg | ORAL_TABLET | Freq: Every day | ORAL | Status: DC
  Administered 2022-10-12: 5 mg via ORAL
  Filled 2022-10-12: qty 1

## 2022-10-12 MED ORDER — SODIUM CHLORIDE 0.9% FLUSH
3.0000 mL | Freq: Two times a day (BID) | INTRAVENOUS | Status: DC
  Administered 2022-10-12 (×3): 3 mL via INTRAVENOUS

## 2022-10-12 MED ORDER — PRAVASTATIN SODIUM 20 MG PO TABS
20.0000 mg | ORAL_TABLET | Freq: Every day | ORAL | Status: DC
  Administered 2022-10-12 (×2): 20 mg via ORAL
  Filled 2022-10-12 (×2): qty 1

## 2022-10-12 NOTE — Progress Notes (Signed)
ANTICOAGULATION CONSULT NOTE  Pharmacy Consult for Warfarin  Indication: atrial fibrillation and mAVR  Patient Measurements: Height: 5\' 8"  (172.7 cm) Weight: 79.2 kg (174 lb 9.6 oz) IBW/kg (Calculated) : 68.4  Labs: Recent Labs    10/11/22 1659 10/11/22 1925 10/11/22 2256 10/12/22 0209  HGB 12.4*  --   --  12.5*  HCT 38.3*  --   --  37.8*  PLT 135*  --   --  145*  LABPROT  --  28.6*  --  28.6*  INR  --  2.7*  --  2.7*  CREATININE 1.01  --   --  0.98  TROPONINIHS 20* 29* 27* 20*    Estimated Creatinine Clearance: 82.4 mL/min (by C-G formula based on SCr of 0.98 mg/dL).  Medical History: Past Medical History:  Diagnosis Date   Acute cholecystitis 12/25/2021   Acute cystitis with hematuria 11/20/2020   Last Assessment & Plan:   Formatting of this note might be different from the original.  Patient completed a course of Keflex. Symptoms resolved. Denies dysuria, frequency, urgency, fevers, chills, abdominal pain or cramps.   Anemia    Aortic aneurysm and dissection (HCC)    a. 2001 s/p grafting and AVR; b. 09/2021 CTA Chest: Dil Ao root up to 51mm. Ao arch 46mm. Desc Ao 54mm. Complicated arch dissection w/ mult false and/or partially thrombosed lumens. Dissection flaps extend into all great vessels except L carotid. Dissection cont into R CCA and into abd->L RA.   Chronic anticoagulation    Chronic cholecystitis 01/31/2022   Dissecting aortic aneurysm, thoracoabdominal (HCC)    Fatty liver    GERD (gastroesophageal reflux disease)    H/O mechanical aortic valve replacement    a. 2001 s/p AVR in setting of Asc Ao aneurysm repair; b. 08/2021 Echo: EF 50-55%, no rwma, mild LVH, GrII DD, nl RV fxn, mild MR, triv AI w/ mean AoV grad .   Heart murmur    History of cardiac catheterization    a. 08/2019 MV: basal-dist lateral and inflat defect; b. 07/2020 Cath: nl cors.   Hyperlipidemia    Hypertension    Hypolipidemia    Iron deficiency    PAF (paroxysmal atrial fibrillation)  (HCC)    a. CHA2DS2VASc = 3.  Chronic warfarin (also mech AVR).   Pulmonary nodule    Sleep apnea    Stroke [facility])     Assessment: Pharmacy consulted to dose warfarin for mechanical valve in this 57 year old male admitted with chest pain.  Pt was on Warfarin 2.5 mg Sun-Tue-Thurs-Sat and Warfarin 5 mg PO Mon-Wed-Fri. Last dose warfarin 5 mg on 4/27.  Date INR Plan  4/28 2.7 2.5 mg  4/29 2.7 5 mg   No significant DDIs identified  Goal of Therapy:  INR : 2.5 - 3.5    Plan:  --INR therapeutic --Continue home regimen; warfarin 5 mg tonight --Daily INR per protocol --CBC per protocol  Tressie Ellis 10/12/2022,11:15 AM

## 2022-10-12 NOTE — Consult Note (Signed)
Cardiology Consultation:   Patient ID: Ryan [name]; [id number]; [birth year]   Admit date: 10/11/2022 Date of Consult: 10/12/2022  Primary Care Provider: Larae Grooms, NP Primary Cardiologist: End Primary Electrophysiologist:  None   Patient Profile:   Ryan [name] is a 57 y.o. 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 Allena Katz.  History of Present Illness:   Ryan [name] underwent LHC in 07/2020 in the setting of abnormal nuclear stress test which showed normal coronary arteries.  Echo in 08/2021 demonstrated an EF of 50 to 55%, no regional wall motion abnormalities, mild LVH, grade 2 diastolic dysfunction.,  Normal RV systolic function with mildly large ventricular cavity size, mildly dilated right atrium, mild mitral regurgitation, prior replacement of the aortic valve with a mean gradient of 9 mmHg.  Most recent ischemic evaluation in 11/2021 was abnormal with a large, fixed defect involving the inferior and inferolateral walls most consistent with scar, but could not rule out an element of artifact.  There was no evidence of significant ischemia.  Overall, findings were similar to study in 2022 which led to the above LHC which showed normal coronary arteries.  He underwent cholecystectomy in 01/2022 and reported improvement in his chronic chest pain up until an episode in 06/2022.  More recently, he was riding his bike (participating in the 100 mile bike challenge) and developed chest pain about halfway to  work with associated shortness of breath and blurred vision.  Pain feels similar to his prior episodes of chest pain, though was a little more intense.  Pain lasted for approximately 1 hour prompting him to come in for evaluation.  Upon arrival, he was noted to be hemodynamically stable with baseline relative hypotension and bradycardic rates.  High-sensitivity troponin 20 with a delta and peak troponin of 29, subsequently downtrending and consistent with with readings dating back at least to 2022.  Chest x-ray showed no active cardiopulmonary disease.  CT head showed no acute intracranial process with a small chronic right cerebellar infarct.  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  The dissection and aneurysmal dilatation of the thoracic aorta extended through the abdominal aorta and was a stable, terminating at the origin of the right external iliac and distal left common iliac arteries.  Dissection flap extended into the origin of the right renal artery as well as into the left renal artery with stenosis of true lumen.  Partial opacification of the anomalous lumen from retrograde below inferiorly at the aortic bifurcation.  Partially thrombosed false lumen of the thoracic aorta.  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  There were no findings to suggest origin of ischemia.  Since arrival, patient has been without symptoms of angina.  In the meantime the patient received aspirin as well as IV morphine.  This morning, he has ambulated along the nurses station without angina he is concerned regarding the etiology of his chest discomfort.  Currently chest  pain-free.    Past Medical History:  Diagnosis Date   Acute cholecystitis 12/25/2021   Acute cystitis with hematuria 11/20/2020   Last  Assessment & Plan:   Formatting of this note might be different from the original.  Patient completed a course of Keflex. Symptoms resolved. Denies dysuria, frequency, urgency, fevers, chills, abdominal pain or cramps.   Anemia    Aortic aneurysm and dissection (HCC)    a. 2001 s/p grafting and AVR; b. 09/2021 CTA Chest: Dil Ao root up to 51mm. Ao arch 46mm. Desc Ao 54mm. Complicated arch dissection w/ mult false and/or partially thrombosed lumens. Dissection flaps extend into all great vessels except L carotid. Dissection cont into R CCA and into abd->L RA.   Chronic anticoagulation    Chronic cholecystitis 01/31/2022   Dissecting aortic aneurysm, thoracoabdominal (HCC)    Fatty liver    GERD (gastroesophageal reflux disease)    H/O mechanical aortic valve replacement    a. 2001 s/p AVR in setting of Asc Ao aneurysm repair; b. 08/2021 Echo: EF 50-55%, no rwma, mild LVH, GrII DD, nl RV fxn, mild MR, triv AI w/ mean AoV grad .   Heart murmur    History of cardiac catheterization    a. 08/2019 MV: basal-dist lateral and inflat defect; b. 07/2020 Cath: nl cors.   Hyperlipidemia    Hypertension    Hypolipidemia    Iron deficiency    PAF (paroxysmal atrial fibrillation) (HCC)    a. CHA2DS2VASc = 3.  Chronic warfarin (also mech AVR).   Pulmonary nodule    Sleep apnea    Stroke [facility])     Past Surgical History:  Procedure Laterality Date   ABDOMINAL SURGERY     AORTIC VALVE REPLACEMENT  2001   [employer] Mechanical   ARTHROSCOPIC REPAIR ACL     CHOLECYSTECTOMY  02/01/2022   COLONOSCOPY  2019   IR PERC CHOLECYSTOSTOMY  12/26/2021   MENISCUS REPAIR       Home Meds: Prior to Admission medications   Medication Sig Start Date End Date Taking? Authorizing Provider  acetaminophen (TYLENOL) 500 MG tablet Take 500 mg by mouth every 6 (six) hours as needed for headache, fever, moderate pain or mild pain.   Yes [provider]  aspirin 81 MG EC tablet Take 81 mg by mouth daily.   Yes  [provider]  Ferrous Sulfate Dried (HIGH POTENCY IRON) 65 MG TABS Take 2 tablets by mouth daily. 08/13/21  Yes [provider]  lisinopril (ZESTRIL) 5 MG tablet Take 1 tablet by mouth once daily 07/17/22  Yes End, Cristal Deer, MD  lovastatin (MEVACOR) 20 MG tablet TAKE 1 TABLET BY MOUTH AT BEDTIME 04/15/22  Yes Ryan Grooms, NP  oxymetazoline (AFRIN) 0.05 % nasal spray Place 1 spray into both nostrils 2 (two) times daily.   Yes [provider]  Potassium 99 MG TABS Take 2 tablets by mouth every evening.   Yes [provider]  predniSONE (DELTASONE) 50 MG tablet Please take 1 tablet 13 hours, 7 hours, and then 1 hour prior to your scheduled CTA scan Patient not taking: Reported on 10/11/2022 10/20/22   Alleen Borne, MD  warfarin (COUMADIN) 5 MG tablet Take 1/2 to one tablet by mouth once daily as instructed by anticoagulation clinic. Patient taking differently: Take 2.5-5 mg by mouth daily at 4 PM. Take 1/2 to one tablet by mouth once daily as instructed by anticoagulation clinic. 09/02/22  Yes End, Cristal Deer, MD  Inpatient Medications: Scheduled Meds:  aspirin EC  81 mg Oral Daily   lisinopril  5 mg Oral Daily   pravastatin  20 mg Oral q1800   sodium chloride flush  3 mL Intravenous Q12H   warfarin  2.5 mg Oral Q T,Th,S,Su-1800   warfarin  5 mg Oral Q M,W,F-1800   Warfarin - Pharmacist Dosing Inpatient   Does not apply q1600   Continuous Infusions:  sodium chloride 10 mL/hr at 10/12/22 1156   PRN Meds: acetaminophen **OR** acetaminophen, morphine injection  Allergies:   Allergies  Allergen Reactions   Alitraq Rash   Iodinated Contrast Media Rash   Iodine Rash    CT dye CT dye  CT dye CT dye    Social History:   Social History   Socioeconomic History   Marital status: Single    Spouse name: Not on file   Number of children: Not on file   Years of education: Not on file   Highest education level: Not on file  Occupational  History   Not on file  Tobacco Use   Smoking status: Never    Passive exposure: Past   Smokeless tobacco: Never  Vaping Use   Vaping Use: Never used  Substance and Sexual Activity   Alcohol use: Yes    Comment: 2 mixed drinks per year   Drug use: Never   Sexual activity: Not Currently  Other Topics Concern   Not on file  Social History Narrative   Not on file   Social Determinants of Health   Financial Resource Strain: Not on file  Food Insecurity: No Food Insecurity (10/12/2022)   Hunger Vital Sign    Worried About Running Out of Food in the Last Year: Never true    Ran Out of Food in the Last Year: Never true  Transportation Needs: No Transportation Needs (10/12/2022)   PRAPARE - Administrator, Civil Service (Medical): No    Lack of Transportation (Non-Medical): No  Physical Activity: Not on file  Stress: Not on file  Social Connections: Not on file  Intimate Partner Violence: Not At Risk (10/12/2022)   Humiliation, Afraid, Rape, and Kick questionnaire    Fear of Current or Ex-Partner: No    Emotionally Abused: No    Physically Abused: No    Sexually Abused: No     Family History:   Family History  Problem Relation Age of Onset   Heart attack Mother    Hypertension Mother    Hyperlipidemia Mother    Heart disease Mother    Heart attack Maternal Grandfather     ROS:  Review of Systems  Constitutional:  Negative for chills, diaphoresis, fever, malaise/fatigue and weight loss.  HENT:  Negative for congestion.   Eyes:  Positive for blurred vision. Negative for discharge and redness.  Respiratory:  Positive for shortness of breath. Negative for cough, sputum production and wheezing.   Cardiovascular:  Positive for chest pain. Negative for palpitations, orthopnea, claudication, leg swelling and PND.  Gastrointestinal:  Negative for abdominal pain, heartburn, nausea and vomiting.  Musculoskeletal:  Negative for falls and myalgias.  Skin:  Negative for  rash.  Neurological:  Negative for dizziness, tingling, tremors, sensory change, speech change, focal weakness, loss of consciousness and weakness.  Endo/Heme/Allergies:  Does not bruise/bleed easily.  Psychiatric/Behavioral:  Negative for substance abuse. The patient is not nervous/anxious.   All other systems reviewed and are negative.     Physical Exam/Data:   Vitals:  10/12/22 0050 10/12/22 0510 10/12/22 0528 10/12/22 0818  BP: 113/68 (!) 88/48 (!) 95/56 92/61  Pulse: (!) 51 (!) 45 61 (!) 51  Resp: 20 18 16 18   Temp: 97.6 F (36.4 C) 97.9 F (36.6 C) 98 F (36.7 C) 98.2 F (36.8 C)  TempSrc: Oral Oral Oral   SpO2: 99% 94% 97% 93%  Weight: 79.2 kg     Height: 5\' 8"  (1.727 m)       Intake/Output Summary (Last 24 hours) at 10/12/2022 1205 Last data filed at 10/12/2022 0941 Gross per 24 hour  Intake 15.85 ml  Output 300 ml  Net -284.15 ml   Filed Weights   10/11/22 1657 10/12/22 0050  Weight: 72.6 kg 79.2 kg   Body mass index is 26.55 kg/m.   Physical Exam: General: Well developed, well nourished, in no acute distress. Head: Normocephalic, atraumatic, sclera non-icteric, no xanthomas, nares without discharge.  Neck: Negative for carotid bruits. JVD not elevated. Lungs: Clear bilaterally to auscultation without wheezes, rales, or rhonchi. Breathing is unlabored. Heart: RRR with S1 S2. II/VI systolic murmurs, rubs, or gallops appreciated. Abdomen: Soft, non-tender, non-distended with normoactive bowel sounds. No hepatomegaly. No rebound/guarding. No obvious abdominal masses. Msk:  Strength and tone appear normal for age. Extremities: No clubbing or cyanosis. No edema. Distal pedal pulses are 2+ and equal bilaterally. Neuro: Alert and oriented X 3. No facial asymmetry. No focal deficit. Moves all extremities spontaneously. Psych:  Responds to questions appropriately with a normal affect.   EKG:  The EKG was personally reviewed and demonstrates: SR, 65 bpm, PVCs,  LBBB Telemetry:  Telemetry was personally reviewed and demonstrates: sinus bradycardia, 40s to 50s bpm, PVCs, 15 beat run of NSVT, BBB  Weights: Filed Weights   10/11/22 1657 10/12/22 0050  Weight: 72.6 kg 79.2 kg    Relevant CV Studies:   Cath/PCI: LHC (07/22/2020, [facility]): LMCA normal.  LAD normal.  LCx normal.  Dominant RCA normal.   CV Surgery: Aortic root repair and mechanical AVR (2001)   EP Procedures and Devices: None   Non-Invasive Evaluation(s): Pharmacologic MPI (12/12/2021): Abnormal, potentially high risk pharmacologic myocardial perfusion stress test.  There is a large, fixed defect involving the inferior and inferolateral walls most consistent with scar but cannot rule out an element of artifact.  No significant ischemia identified.  LVEF 50% by Siemens calculation.  Transient ischemic dilation noted (TID1.35). CTA chest (09/28/2021): Complex aortic dissection with aortic valve replacement.  Dilated aortic root measuring up to 5.1 cm, aortic arch measuring up to 4.6 cm and descending aorta measuring up to 5.4 cm.  Dissections extend into all great vessels except for the left common carotid artery.  Duplicated SVC noted. TTE (08/27/2021): Normal LV size with mild LVH.  LVEF 50-55% with grade 2 diastolic dysfunction.  Mildly dilated RV with normal contraction.  Mild right atrial enlargement.  Mild mitral regurgitation.  Mechanical aortic valve present with mean gradient 7 mmHg.  Normal CVP. CTA chest, abdomen, and pelvis (04/25/2021, [facility]): Patient status post AVR and ascending aortic graft.  Residual dissection in the arch to the level of the bilateral common iliac arteries.  Aortic root measures up to 5.0 cm.  Arch and descending aorta measure up to 4.8 cm.  Findings are stable from prior study in 01/2021. TTE (02/05/2021, [facility]): Technically difficult study.  Mildly dilated left ventricle with LVEF 55-60% with abnormal septal motion.  Moderate left atrial  enlargement.  Normal mechanical AVR gradient (mean gradient 7  mmHg) with mild regurgitation.  Laboratory Data:  Chemistry Recent Labs  Lab 10/11/22 1659 10/12/22 0209  NA 136 134*  K 3.2* 4.0  CL 106 106  CO2 23 24  GLUCOSE 144* 162*  BUN 11 14  CREATININE 1.01 0.98  CALCIUM 8.2* 8.4*  GFRNONAA >60 >60  ANIONGAP 7 4*    Recent Labs  Lab 10/11/22 1014 10/12/22 0209  PROT 5.9* 6.4*  ALBUMIN 2.4* 3.6  AST 39 20  ALT 19 18  ALKPHOS 91 55  BILITOT 1.6* 0.9   Hematology Recent Labs  Lab 10/11/22 1659 10/12/22 0209  WBC 4.6 4.9  RBC 3.96* 4.00*  HGB 12.4* 12.5*  HCT 38.3* 37.8*  MCV 96.7 94.5  MCH 31.3 31.3  MCHC 32.4 33.1  RDW 13.7 13.5  PLT 135* 145*   Cardiac EnzymesNo results for input(s): "TROPONINI" in the last 168 hours. No results for input(s): "TROPIPOC" in the last 168 hours.  BNPNo results for input(s): "BNP", "PROBNP" in the last 168 hours.  DDimer No results for input(s): "DDIMER" in the last 168 hours.  Radiology/Studies:  CT Angio Chest/Abd/Pel for Dissection W and/or Wo Contrast  Addendum Date: 10/11/2022   ADDENDUM REPORT: 10/11/2022 23:23 ADDENDUM: These results were called by telephone at the time of interpretation on 10/11/2022 at 10:55 pm to provider [facility] , who verbally acknowledged these results. Electronically Signed   By: Tish Frederickson M.D.   On: 10/11/2022 23:23   Result Date: 10/11/2022 IMPRESSION: 1. 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 The dissection and aneurysmal dilatation of the thoracic aorta extends through the abdominal aorta (stable) and terminates at the origin of the right external iliac and distal left common iliac arteries. Dissection flap extends into the origin of the right renal artery as well as into the left renal artery with stenosis of the true lumen. Partial opacification  of the false lumen from retrograde flow inferiorly at the aortic bifurcation. Partially thrombosed false lumen of the thoracic aorta. 2.  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 3. No findings to suggest organ ischemia. 4. Recommend semi-annual imaging followup by CTA or MRA and referral to cardiothoracic surgery if not already obtained. This recommendation follows 2010 ACCF/AHA/AATS/ACR/ASA/SCA/SCAI/SIR/STS/SVM Guidelines for the Diagnosis and Management of Patients With Thoracic Aortic Disease. Circulation. 2010; 121: [id number]. Aortic aneurysm NOS (ICD10-I71.9). 5. Other imaging findings of potential clinical significance: Cardiomegaly. Status post aortic valve replacement. Colonic diverticulosis with no acute diverticulitis. Prostatomegaly. Small to moderate volume fat contained left inguinal hernia. 6. Aortic Atherosclerosis (ICD10-I70.0). Aortic aneurysm NOS (ICD10-I71.9). Electronically Signed: By: Tish Frederickson M.D. On: 10/11/2022 22:37   CT HEAD WO CONTRAST ( )  Result Date: 10/11/2022 IMPRESSION: 1. No acute intracranial process. 2. Small chronic right cerebellar infarct. Electronically Signed   By: Darliss Cheney M.D.   On: 10/11/2022 22:21   DG Chest 2 View  Result Date: 10/11/2022 IMPRESSION: No active cardiopulmonary disease. Electronically Signed   By: Gerome Sam III M.D.   On: 10/11/2022 17:21    Assessment and Plan:   1.  Elevated high-sensitivity troponin level with chest pain: -Currently without symptoms of angina or cardiac decompensation -LHC in 07/2020 showed normal coronary arteries -Lexiscan MPI in 11/2021 was unchanged when compared to study in 2022 which led to the above LHC -High-sensitivity troponin is minimally elevated and flat trending, consistent with baseline readings dating back to  2022 -CTA of the chest, abdomen, pelvis that showed stable thoracic aortic aneurysm and dissection -Obtain echo to evaluate for any new significant structural  abnormalities -Will discuss further evaluation with MD  2.  Aortic dissection status postrepair and mechanical AVR: -Stable on CTA of the chest, abdomen, pelvis this admission -Continue indefinite anticoagulation with warfarin -Continue follow-up with CVTS  3.  PAF: -Maintaining sinus rhythm with PVCs and old LBBB -Continue indefinite warfarin given mechanical AVR -No AV nodal blocking medications given history of resting bradycardia  4.  HTN: -BP soft, though stable -Reduce lisinopril back to PTA 2.5 mg  5.  HLD: -Remains on lovastatin       For questions or updates, please contact [facility] Please consult [email or web address] for contact info under Cardiology/STEMI.   Signed, Eula Listen, PA-C [facility] Pager: [phone] 10/12/2022, 12:05 PM

## 2022-10-12 NOTE — Progress Notes (Signed)
ANTICOAGULATION CONSULT NOTE - Initial Consult  Pharmacy Consult for Warfarin  Indication:  mechanical valve  Allergies  Allergen Reactions   Alitraq Rash   Iodinated Contrast Media Rash   Iodine Rash    CT dye CT dye  CT dye CT dye    Patient Measurements: Height: 5\' 8"  (172.7 cm) Weight: 72.6 kg (160 lb) IBW/kg (Calculated) : 68.4 Heparin Dosing Weight:   Vital Signs: Temp: 98.1 F (36.7 C) (04/28 2104) Temp Source: Oral (04/28 2104) BP: 100/59 (04/29 0030) Pulse Rate: 46 (04/29 0030)  Labs: Recent Labs    10/11/22 1659 10/11/22 1925 10/11/22 2256  HGB 12.4*  --   --   HCT 38.3*  --   --   PLT 135*  --   --   LABPROT  --  28.6*  --   INR  --  2.7*  --   CREATININE 1.01  --   --   TROPONINIHS 20* 29* 27*    Estimated Creatinine Clearance: 80 mL/min (by C-G formula based on SCr of 1.01 mg/dL).   Medical History: Past Medical History:  Diagnosis Date   Acute cholecystitis 12/25/2021   Acute cystitis with hematuria 11/20/2020   Last Assessment & Plan:   Formatting of this note might be different from the original.  Patient completed a course of Keflex. Symptoms resolved. Denies dysuria, frequency, urgency, fevers, chills, abdominal pain or cramps.   Anemia    Aortic aneurysm and dissection (HCC)    a. 2001 s/p grafting and AVR; b. 09/2021 CTA Chest: Dil Ao root up to 51mm. Ao arch 46mm. Desc Ao 54mm. Complicated arch dissection w/ mult false and/or partially thrombosed lumens. Dissection flaps extend into all great vessels except L carotid. Dissection cont into R CCA and into abd->L RA.   Chronic anticoagulation    Chronic cholecystitis 01/31/2022   Dissecting aortic aneurysm, thoracoabdominal (HCC)    Fatty liver    GERD (gastroesophageal reflux disease)    H/O mechanical aortic valve replacement    a. 2001 s/p AVR in setting of Asc Ao aneurysm repair; b. 08/2021 Echo: EF 50-55%, no rwma, mild LVH, GrII DD, nl RV fxn, mild MR, triv AI w/ mean AoV grad  .   Heart murmur    History of cardiac catheterization    a. 08/2019 MV: basal-dist lateral and inflat defect; b. 07/2020 Cath: nl cors.   Hyperlipidemia    Hypertension    Hypolipidemia    Iron deficiency    PAF (paroxysmal atrial fibrillation) (HCC)    a. CHA2DS2VASc = 3.  Chronic warfarin (also mech AVR).   Pulmonary nodule    Sleep apnea    Stroke [facility])     Medications:  (Not in a hospital admission)   Assessment: Pharmacy consulted to dose warfarin for mechanical valve in this 57 year old male admitted with chest pain.  Pt was on Warfarin 2.5 mg PO Sun-Tue-Thurs-Sat and Warfarin 5 mg PO Mon-Wed-Fri.  Last dose warfarin 5 mg on 4/27.  4/28: INR @ 1925 = 2.7  Goal of Therapy:  INR : 2.5 - 3.5    Plan:  INR therapeutic upon presentation to ED.  Will continue pt home warfarin dose to resume now (4/28 dose not given yet):  Warfarin 2.5 mg PO Tue-Thurs-Sat-Sun to resume now and Warfarin 5 mg PO Mon-Wed-Fri.  - Will recheck INR on 4/29 with AM labs.   Ryan [name] D 10/12/2022,12:36 AM
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
## 2022-10-12 NOTE — Progress Notes (Signed)
Interim progress note not for billing  I've seen and examined the patient, reviewed chart. Agree with h and p.   Chest pain resolved and this is a very chronic problem  Cardiology to see patient today, TTE ordered, recs pending.

## 2022-10-12 NOTE — Progress Notes (Signed)
*  PRELIMINARY RESULTS* Echocardiogram 2D Echocardiogram has been performed.  Carolyne Fiscal 10/12/2022, 4:23 PM

## 2022-10-13 ENCOUNTER — Telehealth: Payer: Self-pay | Admitting: *Deleted

## 2022-10-13 ENCOUNTER — Other Ambulatory Visit (HOSPITAL_COMMUNITY): Payer: Self-pay | Admitting: Emergency Medicine

## 2022-10-13 ENCOUNTER — Encounter (HOSPITAL_COMMUNITY): Payer: Self-pay

## 2022-10-13 DIAGNOSIS — R0789 Other chest pain: Secondary | ICD-10-CM | POA: Diagnosis not present

## 2022-10-13 DIAGNOSIS — R079 Chest pain, unspecified: Secondary | ICD-10-CM | POA: Diagnosis not present

## 2022-10-13 DIAGNOSIS — Z91041 Radiographic dye allergy status: Secondary | ICD-10-CM

## 2022-10-13 DIAGNOSIS — R072 Precordial pain: Secondary | ICD-10-CM

## 2022-10-13 LAB — PROTIME-INR
INR: 3 — ABNORMAL HIGH (ref 0.8–1.2)
Prothrombin Time: 30.9 seconds — ABNORMAL HIGH (ref 11.4–15.2)

## 2022-10-13 MED ORDER — DIPHENHYDRAMINE HCL 50 MG PO CAPS: ORAL_CAPSULE | ORAL | 0 refills | Status: DC

## 2022-10-13 MED ORDER — METOPROLOL TARTRATE 25 MG PO TABS
25.0000 mg | ORAL_TABLET | Freq: Once | ORAL | 0 refills | Status: DC
Start: 2022-10-13 — End: 2022-10-27

## 2022-10-13 MED ORDER — PREDNISONE 50 MG PO TABS: ORAL_TABLET | ORAL | 0 refills | Status: DC

## 2022-10-13 MED ORDER — PREDNISONE 50 MG PO TABS
ORAL_TABLET | ORAL | 0 refills | Status: DC
Start: 2022-10-13 — End: 2022-10-27

## 2022-10-13 MED ORDER — LISINOPRIL 5 MG PO TABS
2.5000 mg | ORAL_TABLET | Freq: Every day | ORAL | Status: DC
  Administered 2022-10-13: 2.5 mg via ORAL
  Filled 2022-10-13: qty 1

## 2022-10-13 MED ORDER — DIPHENHYDRAMINE HCL 50 MG PO TABS
ORAL_TABLET | ORAL | 0 refills | Status: DC
Start: 2022-10-13 — End: 2022-10-28

## 2022-10-13 MED ORDER — METOPROLOL TARTRATE 100 MG PO TABS: 100.0000 mg | ORAL_TABLET | Freq: Once | ORAL | 0 refills | Status: DC

## 2022-10-13 NOTE — Progress Notes (Addendum)
Progress Note  Patient Name: Ryan [name] Date of Encounter: 10/13/2022  Primary Cardiologist: End  Subjective   No further chest pain.  No dyspnea.  Has ambulated without issues.  Echo this admission demonstrated an EF of 40 to 45% which appears to be consistent with echo from 2023 upon MD review.  Inpatient Medications    Scheduled Meds:  aspirin EC  81 mg Oral Daily   lisinopril  2.5 mg Oral Daily   pravastatin  20 mg Oral q1800   sodium chloride flush  3 mL Intravenous Q12H   warfarin  2.5 mg Oral Q T,Th,S,Su-1800   warfarin  5 mg Oral Q M,W,F-1800   Warfarin - Pharmacist Dosing Inpatient   Does not apply q1600   Continuous Infusions:  PRN Meds: acetaminophen **OR** acetaminophen, morphine injection   Vital Signs    Vitals:   10/13/22 0337 10/13/22 0500 10/13/22 0800 10/13/22 0820  BP: 95/64  (!) 92/52 118/70  Pulse: (!) 52  (!) 45 (!) 56  Resp: 16  18 14   Temp: 97.9 F (36.6 C)   98 F (36.7 C)  TempSrc: Oral     SpO2: 97%  96% 97%  Weight:  78.1 kg    Height:        Intake/Output Summary (Last 24 hours) at 10/13/2022 1006 Last data filed at 10/13/2022 0700 Gross per 24 hour  Intake 240 ml  Output 700 ml  Net -460 ml   Filed Weights   10/11/22 1657 10/12/22 0050 10/13/22 0500  Weight: 72.6 kg 79.2 kg 78.1 kg    Telemetry    Not on tele - Personally Reviewed  ECG    No new tracings - Personally Reviewed  Physical Exam   GEN: No acute distress.   Neck: No JVD. Cardiac: RRR, II/VI systolic murmur RUSB, no rubs, or gallops.  Respiratory: Clear to auscultation bilaterally.  GI: Soft, nontender, non-distended.   MS: No edema; No deformity. Neuro:  Alert and oriented x 3; Nonfocal.  Psych: Normal affect.  Labs    Chemistry Recent Labs  Lab 10/11/22 1014 10/11/22 1659 10/12/22 0209  NA  --  136 134*  K  --  3.2* 4.0  CL  --  106 106  CO2  --  23 24  GLUCOSE  --  144* 162*  BUN  --  11 14  CREATININE  --  1.01 0.98  CALCIUM  --  8.2*  8.4*  PROT 5.9*  --  6.4*  ALBUMIN 2.4*  --  3.6  AST 39  --  20  ALT 19  --  18  ALKPHOS 91  --  55  BILITOT 1.6*  --  0.9  GFRNONAA  --  >60 >60  ANIONGAP  --  7 4*     Hematology Recent Labs  Lab 10/11/22 1659 10/12/22 0209  WBC 4.6 4.9  RBC 3.96* 4.00*  HGB 12.4* 12.5*  HCT 38.3* 37.8*  MCV 96.7 94.5  MCH 31.3 31.3  MCHC 32.4 33.1  RDW 13.7 13.5  PLT 135* 145*    Cardiac EnzymesNo results for input(s): "TROPONINI" in the last 168 hours. No results for input(s): "TROPIPOC" in the last 168 hours.   BNPNo results for input(s): "BNP", "PROBNP" in the last 168 hours.   DDimer No results for input(s): "DDIMER" in the last 168 hours.   Radiology    CT Angio Chest/Abd/Pel for Dissection W and/or Wo Contrast  Result Date: 10/11/2022 IMPRESSION: 1. Stable ascending  thoracic aorta aneurysm and dissection extending to again involve the right brachiocephalic, right common carotid, left common carotid, and left subclavian arteries. The dissection and aneurysmal dilatation of the thoracic aorta extends through the abdominal aorta (stable) and terminates at the origin of the right external iliac and distal left common iliac arteries. Dissection flap extends into the origin of the right renal artery as well as into the left renal artery with stenosis of the true lumen. Partial opacification of the false lumen from retrograde flow inferiorly at the aortic bifurcation. Partially thrombosed false lumen of the thoracic aorta. 2.  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 3. No findings to suggest organ ischemia. 4. Recommend semi-annual imaging followup by CTA or MRA and referral to cardiothoracic surgery if not already obtained. This recommendation follows 2010 ACCF/AHA/AATS/ACR/ASA/SCA/SCAI/SIR/STS/SVM Guidelines for the Diagnosis and Management of Patients With Thoracic Aortic Disease. Circulation. 2010; 121: E266-e369TAA. Aortic aneurysm NOS (ICD10-I71.9). 5. Other imaging  findings of potential clinical significance: Cardiomegaly. Status post aortic valve replacement. Colonic diverticulosis with no acute diverticulitis. Prostatomegaly. Small to moderate volume fat contained left inguinal hernia. 6. Aortic Atherosclerosis (ICD10-I70.0). Aortic aneurysm NOS (ICD10-I71.9). Electronically Signed: By: Tish Frederickson M.D. On: 10/11/2022 22:37   CT HEAD WO CONTRAST ( )  Result Date: 10/11/2022 IMPRESSION: 1. No acute intracranial process. 2. Small chronic right cerebellar infarct. Electronically Signed   By: Darliss Cheney M.D.   On: 10/11/2022 22:21   DG Chest 2 View  Result Date: 10/11/2022 IMPRESSION: No active cardiopulmonary disease. Electronically Signed   By: Gerome Sam III M.D.   On: 10/11/2022 17:21    Cardiac Studies   2D echo 10/12/2022: 1. Left ventricular ejection fraction, by estimation, is 40 to 45%. The  left ventricle has mildly decreased function. Left ventricular endocardial  border not optimally defined to evaluate regional wall motion. There is  moderate left ventricular  hypertrophy. Left ventricular diastolic parameters are indeterminate.   2. Right ventricular systolic function is normal. The right ventricular  size is normal.   3. Left atrial size was moderately dilated.   4. Right atrial size was mildly dilated.   5. The mitral valve is normal in structure. Mild mitral valve  regurgitation. No evidence of mitral stenosis.   6. The aortic valve has been repaired/replaced. Aortic valve  regurgitation is trivial. No aortic stenosis is present. Echo findings are  consistent with normal structure and function of the aortic valve  prosthesis. Aortic valve mean gradient measures 9.5   mmHg.   7. Technically difficult study due to poor echo windows and suboptimal  parasternal window.   Patient Profile     57 y.o. 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 Allena Katz.   Assessment & Plan    1. Elevated high-sensitivity troponin level with chest pain: -Currently without symptoms of angina or cardiac decompensation -LHC in 07/2020 showed normal coronary arteries -Lexiscan MPI in 11/2021 was unchanged when compared to study in 2022 which led to the above LHC -High-sensitivity troponin is minimally elevated and flat trending, consistent with baseline  readings dating back to 2022 -CTA of the chest, abdomen, pelvis that showed stable thoracic aortic aneurysm and dissection -Echo with stable LV systolic function when reviewed by MD without significant new structural abnormalities  -Given he has remained chest pain-free during admission we will pursue outpatient coronary CTA (our office will arrange) with follow-up thereafter -We will arrange for premedication prophylaxis given contrast allergy   2.  Aortic dissection status postrepair and mechanical AVR: -Stable on CTA of the chest, abdomen, pelvis this admission -Continue indefinite anticoagulation with warfarin -Continue follow-up with CVTS -SBE prophylaxis is indicated for all dental procedures   3.  PAF: -Maintaining sinus rhythm with PVCs and old LBBB -Continue indefinite warfarin given mechanical AVR -No AV nodal blocking medications given history of resting bradycardia   4.  HTN: -BP soft, though stable -Reduce lisinopril back to PTA 2.5 mg   5.  HLD: -PTA statin    For questions or updates, please contact [facility] Please consult [email or web address] for contact  info under Cardiology/STEMI.    Signed, Eula Listen, PA-C [facility] Pager: [phone] 10/13/2022, 10:06 AM

## 2022-10-13 NOTE — Telephone Encounter (Signed)
-----   Message from Sondra Barges, PA-C sent at 10/13/2022  8:05 AM EDT ----- Good morning,  Can you get this patient set up for a coronary CTA this week or next? Dx: precordial pain. Thanks! He will need follow up with one of [place] after coronary CTA.

## 2022-10-13 NOTE — Telephone Encounter (Signed)
Patient has allergy to contrast and will need premedication for this. Discussed with provider and orders given to send in those premeds for patient. Pending scheduling of CCTA for this patient which will need to be done at [facility] due to his allergy.

## 2022-10-13 NOTE — Telephone Encounter (Signed)
Spoke with patient and reviewed date and time for his CCTA. Inquired if he reads My Chart and he replied yes. Advised that I would send all instructions for his test and to give [place] a call if he should have any questions. He verbalized understanding with no further questions at this time.   STAT precert sent

## 2022-10-13 NOTE — Telephone Encounter (Signed)
No availability until end of May. Discussed with provider and he would like it to be ordered as STAT. Will check with [facility] for STAT scheduling.

## 2022-10-13 NOTE — Telephone Encounter (Signed)
Spoke with patient and reviewed order for CCTA which was requested to be done STAT. Discussed that they have openings on Thursday or next Monday. He preferred Thursday for it to be done. Advised that I would get a time and give him a call back. He verbalized understanding.

## 2022-10-13 NOTE — Plan of Care (Signed)

## 2022-10-13 NOTE — Progress Notes (Signed)
ANTICOAGULATION CONSULT NOTE  Pharmacy Consult for Warfarin  Indication: atrial fibrillation and mAVR  Patient Measurements: Height: 5\' 8"  (172.7 cm) Weight: 78.1 kg (172 lb 2.9 oz) IBW/kg (Calculated) : 68.4  Labs: Recent Labs    10/11/22 1659 10/11/22 1925 10/11/22 2256 10/12/22 0209 10/13/22 0445  HGB 12.4*  --   --  12.5*  --   HCT 38.3*  --   --  37.8*  --   PLT 135*  --   --  145*  --   LABPROT  --  28.6*  --  28.6* 30.9*  INR  --  2.7*  --  2.7* 3.0*  CREATININE 1.01  --   --  0.98  --   TROPONINIHS 20* 29* 27* 20*  --     Estimated Creatinine Clearance: 82.4 mL/min (by C-G formula based on SCr of 0.98 mg/dL).  Medical History: Past Medical History:  Diagnosis Date   Acute cholecystitis 12/25/2021   Acute cystitis with hematuria 11/20/2020   Last Assessment & Plan:   Formatting of this note might be different from the original.  Patient completed a course of Keflex. Symptoms resolved. Denies dysuria, frequency, urgency, fevers, chills, abdominal pain or cramps.   Anemia    Aortic aneurysm and dissection (HCC)    a. 2001 s/p grafting and AVR; b. 09/2021 CTA Chest: Dil Ao root up to 51mm. Ao arch 46mm. Desc Ao 54mm. Complicated arch dissection w/ mult false and/or partially thrombosed lumens. Dissection flaps extend into all great vessels except L carotid. Dissection cont into R CCA and into abd->L RA.   Chronic anticoagulation    Chronic cholecystitis 01/31/2022   Dissecting aortic aneurysm, thoracoabdominal (HCC)    Fatty liver    GERD (gastroesophageal reflux disease)    H/O mechanical aortic valve replacement    a. 2001 s/p AVR in setting of Asc Ao aneurysm repair; b. 08/2021 Echo: EF 50-55%, no rwma, mild LVH, GrII DD, nl RV fxn, mild MR, triv AI w/ mean AoV grad .   Heart murmur    History of cardiac catheterization    a. 08/2019 MV: basal-dist lateral and inflat defect; b. 07/2020 Cath: nl cors.   Hyperlipidemia    Hypertension    Hypolipidemia    Iron  deficiency    PAF (paroxysmal atrial fibrillation) (HCC)    a. CHA2DS2VASc = 3.  Chronic warfarin (also mech AVR).   Pulmonary nodule    Sleep apnea    [facility]     Assessment: Pharmacy consulted to dose warfarin for mechanical valve in this 57 year old male admitted with chest pain.  Pt was on Warfarin 2.5 mg Sun-Tue-Thurs-Sat and Warfarin 5 mg PO Mon-Wed-Fri. Last dose warfarin 5 mg on 4/27.  Date INR Plan  4/28 2.7 2.5 mg  4/29 2.7 5 mg  4/30 3 2.5 mg   No significant DDIs identified  Goal of Therapy:  INR : 2.5 - 3.5    Plan:  --INR therapeutic --Continue home regimen; warfarin 2.5 mg tonight --Daily INR per protocol --CBC per protocol  Tressie Ellis 10/13/2022,7:44 AM

## 2022-10-13 NOTE — Discharge Summary (Signed)
Ryan [name] ZOX:[id number] DOB: 07-17-1965 DOA: 10/11/2022  PCP: Ryan Grooms, NP  Admit date: 10/11/2022 Discharge date: 10/13/2022  Time spent: 35 minutes  Recommendations for Outpatient Follow-up:  Close cardiology f/u     Discharge Diagnoses:  Principal Problem:   Chest pain Active Problems:   Dissecting aortic aneurysm, thoracoabdominal (HCC)   Paroxysmal atrial fibrillation (HCC)   Bradycardia   Chronic kidney disease (CKD), stage II (mild)   Status post mechanical aortic valve replacement   OSA (obstructive sleep apnea)   Impaired fasting glucose   History of CVA (cerebrovascular accident)   Chronic heart failure with preserved ejection fraction (HFpEF) (HCC)   Essential hypertension   Discharge Condition: stable  Diet recommendation: heart healthy  Filed Weights   10/11/22 1657 10/12/22 0050 10/13/22 0500  Weight: 72.6 kg 79.2 kg 78.1 kg    History of present illness:  From admission h and p Ryan [name] is an 57 y.o. male seen in ed for chest pain that started on his way to work while riding on his bicycle. Patient states that he thought it was okay for him to ride on his bicycle discussed with patient that because his blood pressure is usually running on the low side that exertion with resistance like riding a bicycle or exercise puts him at risk for increased demand and making his heart work harder causing this type of a chest pain presentation although we will admit him and get cardiology to see him for his chest  pain, also patient advised to let us know overnight if he develops any kind of numbness tingling paresthesias chest discomfort pressure shortness of breath palpitation or any other symptoms.  Patient verbalized understanding. Duration:started today  Frequency: constant. Location:precordial Quality:pressure. Rate:5/10 Radiation:Rarm. Aggravating:exertion. Alleviating:None. Associated factors:no nausea or vomiting. Or diaphoresis.  When evaluated  by EMS patient noted to have a left bundle branch block was given an aspirin and a spray of nitroglycerin.  In the emergency room patient reports chest pain is 3 out of 10. 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 Patient had a left heart cath in February 2022 that was said to be normal.  Hospital Course:  Patient presents with chest pain that occurred while cycling. Has a history of this chest pain, it is not always exertional. Mild troponin elevation stable from priors. CTA showed stability of his known thoracic aortic aneurysm/dissection. Cardiology consulted. TTE with ef 40-45 read by cardiology as not significantly changed since prior. Had abnormal stress test last year stable from priors. Cardiology deferred additional evaluation in the hospital, advises discharge home with close cardiology f/u, plan for cardiac CTA.   Procedures: none   Consultations: cardiology  Discharge Exam: Vitals:   10/13/22 0800 10/13/22 0820  BP: (!) 92/52 118/70  Pulse: (!) 45 (!) 56  Resp: 18 14  Temp:  98 F (36.7 C)  SpO2: 96% 97%    General: nad Cardiovascular: rrr Respiratory: soft systolic murmur Ext: warm, no edema  Discharge Instructions   Discharge Instructions     Diet - low sodium heart healthy   Complete by: As directed    Increase activity slowly   Complete  by: As directed       Allergies as of 10/13/2022       Reactions   Alitraq Rash   Iodinated Contrast Media Rash   Iodine Rash   CT dye CT dye CT dye CT dye        Medication List     STOP taking these medications     predniSONE 50 MG tablet Commonly known as: DELTASONE       TAKE these medications    acetaminophen 500 MG tablet Commonly known as: TYLENOL Take 500 mg by mouth every 6 (six) hours as needed for headache, fever, moderate pain or mild pain.   aspirin EC 81 MG tablet Take 81 mg by mouth daily.   High Potency Iron 65 MG Tabs Take 2 tablets by mouth daily.   lisinopril 5 MG tablet Commonly known as: ZESTRIL Take 1 tablet by mouth once daily   lovastatin 20 MG tablet Commonly known as: MEVACOR TAKE 1 TABLET BY MOUTH AT BEDTIME   oxymetazoline 0.05 % nasal spray Commonly known as: AFRIN Place 1 spray into both nostrils 2 (two) times daily.   Potassium 99 MG Tabs Take 2 tablets by mouth every evening.   warfarin 5 MG tablet Commonly known as: COUMADIN Take as directed. If you are unsure how to take this medication, talk to your nurse or doctor. Original instructions: Take 1/2 to one tablet by mouth once daily as instructed by anticoagulation clinic. What changed:  how much to take how to take this when to take this       Allergies  Allergen Reactions   Alitraq Rash   Iodinated Contrast Media Rash   Iodine Rash    CT dye CT dye  CT dye CT dye    Follow-up Information     [name], Ryan Deer, MD Follow up.   Specialty: Cardiology Contact information: [address] [place] [address] [phone]                  The results of significant diagnostics from this hospitalization (including imaging, microbiology, ancillary and laboratory) are listed below for reference.    Significant Diagnostic Studies: ECHOCARDIOGRAM COMPLETE  Result Date: 10/12/2022    ECHOCARDIOGRAM REPORT   Patient Name:   Ryan [name] Date of Exam: 10/12/2022 Medical Rec #:  [id number]  Height:       68.0 in Accession #:    [id number] Weight:       174.6 lb Date of Birth:  1965/09/03  BSA:          1.929 m Patient Age:    57 years   BP:           102/62 mmHg Patient  Gender: M          HR:           61 bpm. Exam Location:  [facility] Procedure: 2D Echo, Cardiac Doppler and Color Doppler Indications:     Chest Pain  History:         Patient has prior history of Echocardiogram examinations, most                  recent 08/27/2021. CHF, Previous Myocardial Infarction, Stroke                  and TIA, Arrythmias:Atrial Fibrillation and Bradycardia,                  Signs/Symptoms:Chest Pain and Syncope; Risk  Factors:Hypertension, Sleep Apnea and Dyslipidemia. Aortic                  Dissection, Mechanical Aortic valve.  Sonographer:     Mikki Harbor Referring Phys:  [id number] Raymon Mutton DUNN Diagnosing Phys: Lorine Bears MD  Sonographer Comments: Technically difficult study due to poor echo windows and suboptimal parasternal window. IMPRESSIONS  1. Left ventricular ejection fraction, by estimation, is 40 to 45%. The left ventricle has mildly decreased function. Left ventricular endocardial border not optimally defined to evaluate regional wall motion. There is moderate left ventricular hypertrophy. Left ventricular diastolic parameters are indeterminate.  2. Right ventricular systolic function is normal. The right ventricular size is normal.  3. Left atrial size was moderately dilated.  4. Right atrial size was mildly dilated.  5. The mitral valve is normal in structure. Mild mitral valve regurgitation. No evidence of mitral stenosis.  6. The aortic valve has been repaired/replaced. Aortic valve regurgitation is trivial. No aortic stenosis is present. Echo findings are consistent with normal structure and function of the aortic valve prosthesis. Aortic valve mean gradient measures 9.5  mmHg.  7. Technically difficult study due to poor echo windows and suboptimal parasternal window. FINDINGS  Left Ventricle: Left ventricular ejection fraction, by estimation, is 40 to 45%. The left ventricle has mildly decreased function. Left ventricular endocardial border not optimally  defined to evaluate regional wall motion. The left ventricular internal cavity size was normal in size. There is moderate left ventricular hypertrophy. Left ventricular diastolic parameters are indeterminate. Right Ventricle: The right ventricular size is normal. No increase in right ventricular wall thickness. Right ventricular systolic function is normal. Left Atrium: Left atrial size was moderately dilated. Right Atrium: Right atrial size was mildly dilated. Pericardium: There is no evidence of pericardial effusion. Mitral Valve: The mitral valve is normal in structure. Mild mitral valve regurgitation. No evidence of mitral valve stenosis. MV peak gradient, 4.5 mmHg. The mean mitral valve gradient is 1.0 mmHg. Tricuspid Valve: The tricuspid valve is normal in structure. Tricuspid valve regurgitation is not demonstrated. No evidence of tricuspid stenosis. Aortic Valve: The aortic valve has been repaired/replaced. Aortic valve regurgitation is trivial. No aortic stenosis is present. Aortic valve mean gradient measures 9.5 mmHg. Aortic valve peak gradient measures 17.6 mmHg. Aortic valve area, by VTI measures 3.21 cm. There is a mechanical valve present in the aortic position. Echo findings are consistent with normal structure and function of the aortic valve prosthesis. Pulmonic Valve: The pulmonic valve was normal in structure. Pulmonic valve regurgitation is not visualized. No evidence of pulmonic stenosis. Aorta: The aortic root is normal in size and structure. Venous: The inferior vena cava was not well visualized. IAS/Shunts: No atrial level shunt detected by color flow Doppler.  LEFT VENTRICLE PLAX 2D LVIDd:         5.20 cm      Diastology LVIDs:         4.20 cm      LV e' medial:    6.64 cm/s LV PW:         1.10 cm      LV E/e' medial:  14.1 LV IVS:        1.50 cm      LV e' lateral:   10.70 cm/s LVOT diam:     2.10 cm      LV E/e' lateral: 8.7 LV SV:         163 LV SV Index:   84 LVOT  Area:     3.46 cm   LV Volumes (MOD) LV vol d, MOD A2C: 96.7 ml LV vol d, MOD A4C: 116.0 ml LV vol s, MOD A2C: 45.8 ml LV vol s, MOD A4C: 66.5 ml LV SV MOD A2C:     50.9 ml LV SV MOD A4C:     116.0 ml LV SV MOD BP:      56.5 ml RIGHT VENTRICLE RV Basal diam:  4.70 cm RV Mid diam:    3.60 cm RV S prime:     8.70 cm/s LEFT ATRIUM             Index        RIGHT ATRIUM           Index LA diam:        3.60 cm 1.87 cm/m   RA Area:     23.30 cm LA Vol (A2C):   88.2 ml 45.72 ml/m  RA Volume:   79.30 ml  41.11 ml/m LA Vol (A4C):   78.5 ml 40.69 ml/m LA Biplane Vol: 87.1 ml 45.15 ml/m  AORTIC VALVE AV Area (Vmax):    3.10 cm AV Area (Vmean):   3.10 cm AV Area (VTI):     3.21 cm AV Vmax:           209.50 cm/s AV Vmean:          143.500 cm/s AV VTI:            0.507 m AV Peak Grad:      17.6 mmHg AV Mean Grad:      9.5 mmHg LVOT Vmax:         187.50 cm/s LVOT Vmean:        128.500 cm/s LVOT VTI:          0.470 m LVOT/AV VTI ratio: 0.93  AORTA Ao Root diam: 3.60 cm MITRAL VALVE MV Area (PHT): 2.75 cm    SHUNTS MV Area VTI:   3.95 cm    Systemic VTI:  0.47 m MV Peak grad:  4.5 mmHg    Systemic Diam: 2.10 cm MV Mean grad:  1.0 mmHg MV Vmax:       1.06 m/s MV Vmean:      50.2 cm/s MV Decel Time: 276 msec MV E velocity: 93.30 cm/s MV A velocity: 60.80 cm/s MV E/A ratio:  1.53 Lorine Bears MD Electronically signed by Lorine Bears MD Signature Date/Time: 10/12/2022/5:30:43 PM    Final    CT Angio Chest/Abd/Pel for Dissection W and/or Wo Contrast  Addendum Date: 10/11/2022   ADDENDUM REPORT: 10/11/2022 23:23 ADDENDUM: These results were called by telephone at the time of interpretation on 10/11/2022 at 10:55 pm to provider [facility] , who verbally acknowledged these results. Electronically Signed   By: Tish Frederickson M.D.   On: 10/11/2022 23:23   Result Date: 10/11/2022 CLINICAL DATA:  rule out dissection prior h/o EXAM: CT ANGIOGRAPHY CHEST, ABDOMEN AND PELVIS TECHNIQUE: Non-contrast CT of the chest was initially obtained.  Multidetector CT imaging through the chest, abdomen and pelvis was performed using the standard protocol during bolus administration of intravenous contrast. Multiplanar reconstructed images and MIPs were obtained and reviewed to evaluate the vascular anatomy. RADIATION DOSE REDUCTION: This exam was performed according to the departmental dose-optimization program which includes automated exposure control, adjustment of the mA and/or kV according to patient size and/or use of iterative reconstruction technique. CONTRAST:  OMNIPAQUE IOHEXOL 350 MG/ML SOLN COMPARISON:  CT angio  chest 09/28/2021, CT abdomen pelvis 01/31/2022 FINDINGS: CTA CHEST FINDINGS Cardiovascular: Preferential opacification of the thoracic aorta. 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 Status post aortic valve replacement. 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 The dissection and aneurysmal dilatation of the thoracic aorta extends through the abdominal aorta and terminates at the origin of the right external iliac and distal left common iliac arteries. Thoracic aortic arch measures up to 4.4 cm. The descending thoracic aorta measures up to 5.2 x 5.4 cm. Partial opacification of the false lumen from retrograde flow inferiorly at the aortic bifurcation. Partially thrombosed false lumen of the thoracic aorta. Enlarged heart size. No significant pericardial effusion. Severe atherosclerotic plaque of the thoracic aorta. No coronary artery calcifications. Mediastinum/Nodes: No enlarged mediastinal, hilar, or axillary lymph nodes. Thyroid gland, trachea, and esophagus demonstrate no significant findings. Lungs/Pleura: No focal consolidation. No pulmonary nodule. No pulmonary mass. No pleural effusion. No pneumothorax. Musculoskeletal: Sternotomy wires are intact. No suspicious lytic or  blastic osseous lesions. No acute displaced fracture. Multilevel degenerative changes of the spine. Review of the MIP images confirms the above findings. CTA ABDOMEN AND PELVIS FINDINGS VASCULAR Aorta: The dissection and aneurysmal dilatation of the thoracic aorta extends through the abdominal aorta (stable) and terminates at the origin of the right external iliac and distal left common iliac arteries. Suprarenal abdominal aorta measures up to 3.7 x 3.2 cm. Infrarenal abdominal aorta measures up to 3 x 2.7 cm. Severe atherosclerotic plaque. Celiac: Originates from the true lumen. Patent without evidence of aneurysm, dissection, vasculitis or significant stenosis. SMA: Originates from the true lumen. Patent without evidence of aneurysm, dissection, vasculitis or significant stenosis. Renals: The dissection flap extends into the origin of the right renal artery. The dissection flap extends into the left renal artery with a stenosis true lumen. No aneurysm. IMA: Originates from the true lumen. Patent without evidence of aneurysm, dissection, vasculitis or significant stenosis. Inflow: Dissection flap extends into the right common and for origin of the right external iliac artery. Dissection flap extends into the left common iliac artery. Otherwise patent without evidence of aneurysm, dissection, vasculitis or significant stenosis. Veins: No obvious venous abnormality within the limitations of this arterial phase study. Review of the MIP images confirms the above findings. NON-VASCULAR Hepatobiliary: No focal liver abnormality. Status post cholecystectomy. No biliary dilatation. Pancreas: No focal lesion. Normal pancreatic contour. No surrounding inflammatory changes. No main pancreatic ductal dilatation. Spleen: Normal in size without focal abnormality.  Fluid Adrenals/Urinary Tract: No adrenal nodule bilaterally. Bilateral kidneys enhance symmetrically. Density lesions likely represent simple renal cysts. No  hydronephrosis. No hydroureter. The urinary bladder is unremarkable. Stomach/Bowel: Stomach is within normal limits. No evidence of bowel wall thickening or dilatation. Colonic diverticulosis. Appendix appears normal. Lymphatic: No lymphadenopathy. Reproductive: The prostate is enlarged measuring up to 4.9 cm. Other: Nonspecific misty mesentery of the small bowel mesentery. No intraperitoneal free fluid. No intraperitoneal free gas. No organized fluid collection. Musculoskeletal: Small to moderate volume fat contained left inguinal hernia. No suspicious lytic or blastic osseous lesions. No acute displaced fracture. Severe L5-S1 degenerative changes with posterior disc osteophyte complex formation. Review of the MIP images confirms the above findings. IMPRESSION: 1. 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 The dissection and aneurysmal dilatation of the thoracic aorta extends through  the abdominal aorta (stable) and terminates at the origin of the right external iliac and distal left common iliac arteries. Dissection flap extends into the origin of the right renal artery as well as into the left renal artery with stenosis of the true lumen. Partial opacification of the false lumen from retrograde flow inferiorly at the aortic bifurcation. Partially thrombosed false lumen of the thoracic aorta. 2.  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 3. No findings to suggest organ ischemia. 4. Recommend semi-annual imaging followup by CTA or MRA and referral to cardiothoracic surgery if not already obtained. This recommendation follows 2010 ACCF/AHA/AATS/ACR/ASA/SCA/SCAI/SIR/STS/SVM Guidelines for the Diagnosis and Management of Patients With Thoracic Aortic Disease. Circulation. 2010; 121: [id number]. Aortic aneurysm NOS (ICD10-I71.9). 5. Other imaging findings of  potential clinical significance: Cardiomegaly. Status post aortic valve replacement. Colonic diverticulosis with no acute diverticulitis. Prostatomegaly. Small to moderate volume fat contained left inguinal hernia. 6. Aortic Atherosclerosis (ICD10-I70.0). Aortic aneurysm NOS (ICD10-I71.9). Electronically Signed: By: Tish Frederickson M.D. On: 10/11/2022 22:37   CT HEAD WO CONTRAST ( )  Result Date: 10/11/2022 CLINICAL DATA:  Headache EXAM: CT HEAD WITHOUT CONTRAST TECHNIQUE: Contiguous axial images were obtained from the base of the skull through the vertex without intravenous contrast. RADIATION DOSE REDUCTION: This exam was performed according to the departmental dose-optimization program which includes automated exposure control, adjustment of the mA and/or kV according to patient size and/or use of iterative reconstruction technique. COMPARISON:  MRI head 05/25/2022.  CT head 05/25/2022. FINDINGS: Brain: No evidence of acute infarction, hemorrhage, hydrocephalus, extra-axial collection or mass lesion/mass effect. Small chronic infarct in the right cerebellum is unchanged. Vascular: No hyperdense vessel or unexpected calcification. Skull: Normal. Negative for fracture or focal lesion. Sinuses/Orbits: There are small polyps or mucous retention cysts in the left maxillary sinus, unchanged. No air-fluid levels. Orbits and visualized mastoid air cells are within normal limits. Other: None. IMPRESSION: 1. No acute intracranial process. 2. Small chronic right cerebellar infarct. Electronically Signed   By: Darliss Cheney M.D.   On: 10/11/2022 22:21   DG Chest 2 View  Result Date: 10/11/2022 CLINICAL DATA:  Chest pain. EXAM: CHEST - 2 VIEW COMPARISON:  August 05, 2022 chest x-ray FINDINGS: The heart size and mediastinal contours are within normal limits. Both lungs are clear. The visualized skeletal structures are unremarkable. IMPRESSION: No active cardiopulmonary disease. Electronically Signed   By: Gerome Sam III M.D.   On: 10/11/2022 17:21    Microbiology: No results found for this or any previous visit (from the past 240 hour(s)).   Labs: Basic Metabolic Panel: Recent Labs  Lab 10/11/22 1014 10/11/22 1659 10/12/22 0209  NA  --  136 134*  K  --  3.2* 4.0  CL  --  106 106  CO2  --  23 24  GLUCOSE  --  144* 162*  BUN  --  11 14  CREATININE  --  1.01 0.98  CALCIUM  --  8.2* 8.4*  MG 1.8  --   --    Liver Function Tests: Recent Labs  Lab 10/11/22 1014 10/12/22 0209  AST 39 20  ALT 19 18  ALKPHOS 91 55  BILITOT 1.6* 0.9  PROT 5.9* 6.4*  ALBUMIN 2.4* 3.6   No results for input(s): "LIPASE", "AMYLASE" in the last 168 hours. No results for input(s): "AMMONIA" in the last 168 hours. CBC: Recent Labs  Lab 10/11/22 1659 10/12/22 0209  WBC 4.6 4.9  HGB 12.4* 12.5*  HCT  38.3* 37.8*  MCV 96.7 94.5  PLT 135* 145*   Cardiac Enzymes: No results for input(s): "CKTOTAL", "CKMB", "CKMBINDEX", "TROPONINI" in the last 168 hours. BNP: BNP (last 3 results) Recent Labs    01/02/22 1032  BNP 50.8    ProBNP (last 3 results) No results for input(s): "PROBNP" in the last 8760 hours.  CBG: No results for input(s): "GLUCAP" in the last 168 hours.     Signed:  Silvano Bilis MD.  [facility] 10/13/2022, 10:41 AM

## 2022-10-14 ENCOUNTER — Telehealth (HOSPITAL_COMMUNITY): Payer: Self-pay | Admitting: Emergency Medicine

## 2022-10-14 NOTE — Telephone Encounter (Signed)
Reaching out to patient to offer assistance regarding upcoming cardiac imaging study; pt verbalizes understanding of appt date/time, parking situation and where to check in, pre-test NPO status and medications ordered, and verified current allergies; name and call back number provided for further questions should they arise Rockwell Alexandria RN Navigator Cardiac Imaging [facility] [phone] office [phone] cell  Plans to pick up meds today and begin taking prednisone at 11:30pm tonight

## 2022-10-15 ENCOUNTER — Other Ambulatory Visit (HOSPITAL_COMMUNITY): Payer: Self-pay | Admitting: *Deleted

## 2022-10-15 ENCOUNTER — Ambulatory Visit (HOSPITAL_COMMUNITY): Admission: RE | Admit: 2022-10-15 | Payer: Medicaid Other | Source: Ambulatory Visit

## 2022-10-15 MED ORDER — PREDNISONE 50 MG PO TABS: ORAL_TABLET | ORAL | 0 refills | Status: DC

## 2022-10-21 ENCOUNTER — Ambulatory Visit (HOSPITAL_COMMUNITY): Payer: Medicaid Other

## 2022-10-21 ENCOUNTER — Other Ambulatory Visit: Payer: Medicaid Other

## 2022-10-21 ENCOUNTER — Inpatient Hospital Stay: Admission: RE | Admit: 2022-10-21 | Payer: Medicaid Other | Source: Ambulatory Visit

## 2022-10-21 ENCOUNTER — Ambulatory Visit: Payer: Medicaid Other | Admitting: Surgery

## 2022-10-23 ENCOUNTER — Telehealth (HOSPITAL_COMMUNITY): Payer: Self-pay | Admitting: Emergency Medicine

## 2022-10-23 ENCOUNTER — Ambulatory Visit (HOSPITAL_COMMUNITY)
Admission: RE | Admit: 2022-10-23 | Discharge: 2022-10-23 | Disposition: A | Discharge status: Home / Self Care | Payer: Medicaid Other | Source: Ambulatory Visit | Attending: Physician Assistant | Admitting: Physician Assistant

## 2022-10-23 ENCOUNTER — Ambulatory Visit (HOSPITAL_COMMUNITY): Admission: RE | Admit: 2022-10-23 | Payer: Medicaid Other | Source: Ambulatory Visit

## 2022-10-23 DIAGNOSIS — R072 Precordial pain: Secondary | ICD-10-CM | POA: Insufficient documentation

## 2022-10-23 DIAGNOSIS — R079 Chest pain, unspecified: Secondary | ICD-10-CM

## 2022-10-23 MED ORDER — METOPROLOL TARTRATE 100 MG PO TABS
100.0000 mg | ORAL_TABLET | Freq: Once | ORAL | 0 refills | Status: DC
Start: 2022-10-23 — End: 2022-10-27

## 2022-10-23 MED ORDER — NITROGLYCERIN 0.4 MG SL SUBL
SUBLINGUAL_TABLET | SUBLINGUAL | Status: AC
  Filled 2022-10-23: qty 2

## 2022-10-23 MED ORDER — IOHEXOL 350 MG/ML SOLN
100.0000 mL | Freq: Once | INTRAVENOUS | Status: AC | PRN
  Administered 2022-10-23: 100 mL via INTRAVENOUS

## 2022-10-23 MED ORDER — NITROGLYCERIN 0.4 MG SL SUBL
0.8000 mg | SUBLINGUAL_TABLET | Freq: Once | SUBLINGUAL | Status: AC
  Administered 2022-10-23: 0.8 mg via SUBLINGUAL

## 2022-10-23 NOTE — Progress Notes (Signed)
Patient tolerated CT well.Vital signs stable encourage to drink water throughout day.Reasons explained and verbalized understanding. Ambulated steady gait.   

## 2022-10-23 NOTE — Telephone Encounter (Signed)
Error

## 2022-10-26 ENCOUNTER — Other Ambulatory Visit: Payer: Self-pay | Admitting: Internal Medicine

## 2022-10-26 MED ORDER — LISINOPRIL 5 MG PO TABS: 5.0000 mg | ORAL_TABLET | Freq: Every day | ORAL | 3 refills | Status: DC

## 2022-10-26 NOTE — Telephone Encounter (Signed)
Please advise on correct dose of medication to refill for patient. Med list reflects lisinopril 5 mg daily. Per 08/19/22 office note- Hypertension: BP reasonable today.  Continue lisinopril 2.5 mg daily  Thank you!

## 2022-10-27 ENCOUNTER — Ambulatory Visit: Payer: Medicaid Other | Attending: Medical | Admitting: Medical

## 2022-10-27 ENCOUNTER — Encounter: Payer: Self-pay | Admitting: Medical

## 2022-10-27 VITALS — BP 106/60 | HR 53 | Ht 68.0 in | Wt 171.0 lb

## 2022-10-27 DIAGNOSIS — I5020 Unspecified systolic (congestive) heart failure: Secondary | ICD-10-CM

## 2022-10-27 DIAGNOSIS — I25118 Atherosclerotic heart disease of native coronary artery with other forms of angina pectoris: Secondary | ICD-10-CM

## 2022-10-27 DIAGNOSIS — I480 Paroxysmal atrial fibrillation: Secondary | ICD-10-CM

## 2022-10-27 DIAGNOSIS — I5022 Chronic systolic (congestive) heart failure: Secondary | ICD-10-CM

## 2022-10-27 DIAGNOSIS — Z952 Presence of prosthetic heart valve: Secondary | ICD-10-CM

## 2022-10-27 DIAGNOSIS — I10 Essential (primary) hypertension: Secondary | ICD-10-CM

## 2022-10-27 DIAGNOSIS — I7100 Dissection of unspecified site of aorta: Secondary | ICD-10-CM

## 2022-10-27 MED ORDER — LISINOPRIL 2.5 MG PO TABS: 2.5000 mg | ORAL_TABLET | Freq: Every day | ORAL | 3 refills | Status: DC

## 2022-10-27 NOTE — Telephone Encounter (Signed)
Pt taking lisinopril 2.5 mg daily. Refill sent to pharmacy

## 2022-10-27 NOTE — Patient Instructions (Signed)
Medication Instructions:  Your Physician recommend you continue on your current medication as directed.    *If you need a refill on your cardiac medications before your next appointment, please call your pharmacy*   Lab Work: None ordered today   Testing/Procedures: None ordered today   Follow-Up: At [facility], you and your health needs are our priority.  As part of our continuing mission to provide you with exceptional heart care, we have created designated Provider Care Teams.  These Care Teams include your primary Cardiologist (physician) and Advanced Practice Providers (APPs -  Physician Assistants and Nurse Practitioners) who all work together to provide you with the care you need, when you need it.  We recommend signing up for the patient portal called "MyChart".  Sign up information is provided on this After Visit Summary.  MyChart is used to connect with patients for Virtual Visits (Telemedicine).  Patients are able to view lab/test results, encounter notes, upcoming appointments, etc.  Non-urgent messages can be sent to your provider as well.   To learn more about what you can do with MyChart, go to [email or web address].    Your next appointment:   3 month(s)  Provider:   You may see Christopher End, MD or one of the following Advanced Practice Providers on your designated Care Team:   Christopher Berge, NP Ryan Dunn, PA-C Cadence Furth, PA-C Sheri Hammock, NP     

## 2022-10-27 NOTE — Progress Notes (Signed)
Cardiology Office Note:    Date:  10/27/2022   ID:  Ryan [name], DOB 1965-08-17, MRN [id number]  PCP:  Larae Grooms, NP  [facility] Cardiologist:  Yvonne Kendall, MD  [facility] Electrophysiologist:  None   Referring MD: Larae Grooms, NP   Chief Complaint: Hospital follow-up  History of Present Illness:    Ryan [name] is a 57 y.o. 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  Left heart cath in February 2022 in the setting of abnormal nuclear stress test showed normal coronaries.  Echo in March 2023 showed EF of 50 to 55%, no wall motion abnormalities, mild LVH, grade 2 diastolic dysfunction, normal RV systolic function, mild MR, prior replacement of the aortic valve with a mean gradient of 9 mmHg.  Ischemic evaluation in July 2023 was abnormal with a large, fixed defect involving the inferior and inferolateral walls most consistent with scar, but cannot rule out an element of artifact.  There was no evidence of ischemia.  Overall, findings are similar to study in 2022 which led to the above left heart cath which showed normal coronary arteries.  Patiently was recently admitted end of April with chest pain.  He had mild troponin elevation.  CT showed stability of his known thoracic aortic aneurysm/dissection.  Echo showed EF of 40 to 45%, which was not significantly changed from prior.  A cardiac CT was ordered.  This showed minimal nonobstructive CAD 0 to 24%.  Today, the patient reports he is overall doing well.  Blood pressure is soft.  He is taking lisinopril 2.5 mg daily.  Patient  reports chronic atypical chest pain that he has had for over 20 years.  No significant shortness of breath, lower leg edema, orthopnea, PND.   Past Medical History:  Diagnosis Date   Acute cholecystitis 12/25/2021   Acute cystitis with hematuria 11/20/2020   Last Assessment & Plan:   Formatting of this note might be different from the original.  Patient completed a course of Keflex. Symptoms resolved. Denies dysuria, frequency, urgency, fevers, chills, abdominal pain or cramps.   Anemia    Aortic aneurysm and dissection (HCC)    a. 2001 s/p grafting and AVR; b. 09/2021 CTA Chest: Dil Ao root up to 51mm. Ao arch 46mm. Desc Ao 54mm. Complicated arch dissection w/ mult false and/or partially thrombosed lumens. Dissection flaps extend into all great vessels except L carotid. Dissection cont into R CCA and into abd->L RA.   Chronic anticoagulation    Chronic cholecystitis 01/31/2022   Dissecting aortic aneurysm, thoracoabdominal (HCC)    Fatty liver    GERD (gastroesophageal reflux disease)    H/O mechanical aortic valve replacement    a. 2001 s/p AVR in setting of Asc Ao aneurysm repair; b. 08/2021 Echo: EF 50-55%, no rwma, mild LVH, GrII DD, nl RV fxn, mild MR, triv AI w/ mean AoV grad .   Heart murmur    History of cardiac catheterization    a. 08/2019 MV: basal-dist lateral and inflat defect; b. 07/2020 Cath: nl cors.   Hyperlipidemia    Hypertension    Hypolipidemia    Iron deficiency  PAF (paroxysmal atrial fibrillation) (HCC)    a. CHA2DS2VASc = 3.  Chronic warfarin (also mech AVR).   Pulmonary nodule    Sleep apnea    Stroke [facility])     Past Surgical History:  Procedure Laterality Date   ABDOMINAL SURGERY     AORTIC VALVE REPLACEMENT  2001   [employer] Mechanical   ARTHROSCOPIC REPAIR ACL     CHOLECYSTECTOMY  02/01/2022   COLONOSCOPY  2019   IR PERC CHOLECYSTOSTOMY  12/26/2021   MENISCUS REPAIR      Current Medications: Current Meds  Medication Sig   acetaminophen  (TYLENOL) 500 MG tablet Take 500 mg by mouth every 6 (six) hours as needed for headache, fever, moderate pain or mild pain.   aspirin 81 MG EC tablet Take 81 mg by mouth daily.   diphenhydrAMINE (BENADRYL) 50 MG capsule Take one capsule 1 hour prior to scan.   diphenhydrAMINE (BENADRYL) 50 MG tablet Take 1 tablet (50mg ) 1 hr prior to CT appt   Ferrous Sulfate Dried (HIGH POTENCY IRON) 65 MG TABS Take 2 tablets by mouth daily.   lovastatin (MEVACOR) 20 MG tablet TAKE 1 TABLET BY MOUTH AT BEDTIME   oxymetazoline (AFRIN) 0.05 % nasal spray Place 1 spray into both nostrils 2 (two) times daily.   Potassium 99 MG TABS Take 2 tablets by mouth every evening.   warfarin (COUMADIN) 5 MG tablet Take 1/2 to one tablet by mouth once daily as instructed by anticoagulation clinic. (Patient taking differently: Take 2.5-5 mg by mouth daily at 4 PM. Take 1/2 to one tablet by mouth once daily as instructed by anticoagulation clinic.)   [DISCONTINUED] lisinopril (ZESTRIL) 2.5 MG tablet Take 2.5 mg by mouth daily.     Allergies:   Alitraq, Iodinated contrast media, and Iodine   Social History   Socioeconomic History   Marital status: Single    Spouse name: Not on file   Number of children: Not on file   Years of education: Not on file   Highest education level: Not on file  Occupational History   Not on file  Tobacco Use   Smoking status: Never    Passive exposure: Past   Smokeless tobacco: Never  Vaping Use   Vaping Use: Never used  Substance and Sexual Activity   Alcohol use: Yes    Comment: 2 mixed drinks per year   Drug use: Never   Sexual activity: Not Currently  Other Topics Concern   Not on file  Social History Narrative   Not on file   Social Determinants of Health   Financial Resource Strain: Not on file  Food Insecurity: No Food Insecurity (10/12/2022)   Hunger Vital Sign    Worried About Running Out of Food in the Last Year: Never true    Ran Out of Food in the Last Year: Never  true  Transportation Needs: No Transportation Needs (10/12/2022)   PRAPARE - Administrator, Civil Service (Medical): No    Lack of Transportation (Non-Medical): No  Physical Activity: Not on file  Stress: Not on file  Social Connections: Not on file     Family History: The patient's family history includes Heart attack in his maternal grandfather and mother; Heart disease in his mother; Hyperlipidemia in his mother; Hypertension in his mother.  ROS:   Please see the history of present illness.     All other systems reviewed and are negative.  EKGs/Labs/Other Studies Reviewed:    The  following studies were reviewed today:  Cardiac CTA 10/2022  IMPRESSION: 1. Coronary calcium score of 159. This was 85th percentile for age-, sex, and race-matched controls.   2. Total plaque volume 151 mm3 which is 51st percentile for age- and sex-matched controls (calcified plaque 16 mm3; non-calcified plaque 135 mm3). TPV is moderate.   3. Normal coronary origin with right dominance.   4. Minimal CAD (<25%) in the LAD/LCX/RCA.   5. 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 Stable compared to prior CTAs in 2023/2024.   6. S/p ascending aortic repair.   7. 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   RECOMMENDATIONS: 1. Minimal non-obstructive CAD (0-24%). Consider non-atherosclerotic causes of chest pain. Consider preventive therapy and risk factor modification.    Echo 09/2022  1. Left ventricular ejection fraction, by estimation, is 40 to 45%. The  left ventricle has mildly decreased function. Left ventricular endocardial  border not optimally defined to evaluate regional wall motion. There is  moderate left ventricular  hypertrophy. Left ventricular diastolic parameters are indeterminate.   2. Right ventricular systolic function is normal. The right ventricular  size is normal.    3. Left atrial size was moderately dilated.   4. Right atrial size was mildly dilated.   5. The mitral valve is normal in structure. Mild mitral valve  regurgitation. No evidence of mitral stenosis.   6. The aortic valve has been repaired/replaced. Aortic valve  regurgitation is trivial. No aortic stenosis is present. Echo findings are  consistent with normal structure and function of the aortic valve  prosthesis. Aortic valve mean gradient measures 9.5   mmHg.   7. Technically difficult study due to poor echo windows and suboptimal  parasternal window.   Echo 08/2021  1. Left ventricular ejection fraction, by estimation, is 50 to 55%. The  left ventricle has low normal function. The left ventricle has no regional  wall motion abnormalities. There is mild left ventricular hypertrophy.  Left ventricular diastolic  parameters are consistent with Grade II diastolic dysfunction  (pseudonormalization).   2. Right ventricular systolic function is normal. The right ventricular  size is mildly enlarged.   3. Right atrial size was mildly dilated.   4. The mitral valve is normal in structure. Mild mitral valve  regurgitation.   5. Mean aortic valve gradient is 9 mmHg. The aortic valve has been  repaired/replaced. Aortic valve regurgitation is trivial. There is a  mechanical valve present in the aortic position.   6. The inferior vena cava is normal in size with greater than 50%  respiratory variability, suggesting right atrial pressure of 3 mmHg.   EKG:  EKG is  ordered today.  The ekg ordered today demonstrates sinus bradycardia, 53 bpm, left bundle branch block  Recent Labs: 01/02/2022: B Natriuretic Peptide 50.8 10/11/2022: Magnesium 1.8 10/12/2022: ALT 18; BUN 14; Creatinine, Ser 0.98; Hemoglobin 12.5; Platelets 145; Potassium 4.0; Sodium 134; TSH 0.749  Recent Lipid Panel    Component Value Date/Time   CHOL 78 10/12/2022 0209   CHOL 81 (L) 09/29/2021 0905   TRIG 41 10/12/2022 0209    HDL 30 (L) 10/12/2022 0209   HDL 28 (L) 09/29/2021 0905   CHOLHDL 2.6 10/12/2022 0209   VLDL 8 10/12/2022 0209   LDLCALC 40 10/12/2022 0209   LDLCALC 18 09/29/2021 0905    Physical Exam:    VS:  BP 106/60 (BP Location: Left  Arm, Patient Position: Sitting, Cuff Size: Normal)   Pulse (!) 53   Ht 5\' 8"  (1.727 m)   Wt 171 lb (77.6 kg)   SpO2 98%   BMI 26.00 kg/m     Wt Readings from Last 3 Encounters:  10/27/22 171 lb (77.6 kg)  10/13/22 172 lb 2.9 oz (78.1 kg)  09/09/22 184 lb (83.5 kg)     GEN:  Well nourished, well developed in no acute distress HEENT: Normal NECK: No JVD; No carotid bruits LYMPHATICS: No lymphadenopathy CARDIAC: RRR, no murmurs, rubs, gallops RESPIRATORY:  Clear to auscultation without rales, wheezing or rhonchi  ABDOMEN: Soft, non-tender, non-distended MUSCULOSKELETAL:  No edema; No deformity  SKIN: Warm and dry NEUROLOGIC:  Alert and oriented x 3 PSYCHIATRIC:  Normal affect   ASSESSMENT:    1. Coronary artery disease involving native coronary artery of native heart with other form of angina pectoris (HCC)   2. Heart failure with mildly reduced ejection fraction (HFmrEF) (HCC)   3. Aortic aneurysm and dissection (HCC)   4. Status post mechanical aortic valve replacement   5. Paroxysmal atrial fibrillation (HCC)   6. Essential hypertension    PLAN:    In order of problems listed above:  Chest pain Nonobstructive CAD Recent hospitalization for chest pain with minimally elevated troponin.  Left heart cath in 2022 showed normal coronary arteries.  Lexiscan in July 2023 was unchanged when compared to study in 2022.  Echo showed unchanged LVEF of 40 to 45%.  Outpatient cardiac CTA showed minimal nonobstructive CAD.  Patient reports chronic atypical chest pain for over 20 years.  EKG today shows sinus bradycardia with no new changes.  Continue aspirin 81 mg daily  HFmrEF Recent echo showed LVEF 40 to 45%, which is overall unchanged from prior study.   Patient is euvolemic on exam.  He is taking lisinopril 2.5 mg daily.  Soft blood pressures limiting guideline directed medical therapy.  Aortic dissection status postrepair mechanical AVR Stable on CT of the chest/abdomen/pelvis on recent admission.  Continue anticoagulation with warfarin.  Patient will see Dr. Lavinia Sharps later this week.  Paroxysmal A-fib Patient is in sinus bradycardia.  Continue warfarin given mechanical AVR.  No AV nodal blocking agents with baseline bradycardia.  Hypertension Blood pressure is soft today.  Patient is on lisinopril 2.5 mg daily.  Disposition: Follow up in 3 month(s) with MD/APP    Signed, Rome Schlauch David Stall, PA-C  10/27/2022 12:34 PM    [facility]

## 2022-10-28 ENCOUNTER — Encounter: Payer: Self-pay | Admitting: Surgery

## 2022-10-28 ENCOUNTER — Ambulatory Visit: Payer: Medicaid Other | Admitting: Surgery

## 2022-10-28 ENCOUNTER — Ambulatory Visit: Payer: Medicaid Other | Attending: Internal Medicine

## 2022-10-28 VITALS — BP 120/70 | HR 62 | Resp 18 | Ht 68.0 in | Wt 171.0 lb

## 2022-10-28 DIAGNOSIS — I7100 Dissection of unspecified site of aorta: Secondary | ICD-10-CM | POA: Diagnosis not present

## 2022-10-28 DIAGNOSIS — Z5181 Encounter for therapeutic drug level monitoring: Secondary | ICD-10-CM

## 2022-10-28 DIAGNOSIS — Z952 Presence of prosthetic heart valve: Secondary | ICD-10-CM | POA: Diagnosis not present

## 2022-10-28 DIAGNOSIS — Z7901 Long term (current) use of anticoagulants: Secondary | ICD-10-CM | POA: Diagnosis not present

## 2022-10-28 LAB — POCT INR: INR: 3.4 — AB (ref 2.0–3.0)

## 2022-10-28 NOTE — Patient Instructions (Signed)
HOLD TONIGHT ONLY  then resume same dosage you have been taking 1/2 tablet every day EXCEPT 1 tablet on Mondays, Wednesdays and Fridays.   EAT GREENS 1 DAY PER WEEK;. [phone]  Recheck INR in 4 weeks

## 2022-10-28 NOTE — Progress Notes (Signed)
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 Rigoberto Noel.  He reports an uncomplicated postoperative course.  He has been maintained on Coumadin.  He had been followed there since until he moved to [place] after his sister passed and he inherited a house.  Review of CT scan reports on Care Everywhere shows that the patient had a residual dissection flap beyond the repair extending up into the innominate and right common carotid arteries as well as left subclavian artery.  A CTA of the chest and abdomen on 04/25/2021 at [facility] showed redemonstration of a residual dissection in the aortic arch through the descending and abdominal aorta and extending into bilateral common iliac arteries.  There was stable dilation of the aortic root measuring 5 cm.  There was aneurysmal dilation of the arch to 4.7 cm and descending thoracic aorta to 4.8 cm which was stable.  The dissection flap continued into the innominate and right common carotid artery as well as the left subclavian artery without change.  The celiac and SMA arise from the true lumen.  The dissection involve the origin of the renal arteries which enhance normally.  The IMA enhanced arising from the true lumen.  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  The dimensions did not appear to be any different than on his previous CT report from 04/2021 done in [place].  We decided to  continue following this.  He has continued to have some intermittent episodes of substernal chest discomfort with numbness and tingling down his arms.  He had a nuclear stress test in June 2023 showing a large fixed defect in the inferior and inferolateral walls consistent with scar with no evidence of significant ischemia.  Left ventricular ejection fraction was 37%.  His most recent echocardiogram on 10/12/2022 shows an ejection fraction of 40 to 45%.  The prosthetic aortic valve is functioning normally.  He underwent a cardiac CT on 10/23/2022 showing a coronary calcium score of 159 which was 85th percentile.  There was minimal coronary disease of less than 25% in the LAD/left circumflex/RCA.  There was asymmetric aneurysmal dilation of the aortic root at 58 x 47 mm with an average diameter of 51 mm.  This was stable compared to his previous CT scans.  He continues to work at [employer].  Current Outpatient Medications  Medication Sig Dispense Refill   acetaminophen (TYLENOL) 500 MG tablet Take 500 mg by mouth every 6 (six) hours as needed for headache, fever, moderate pain or mild pain.     aspirin 81 MG EC tablet Take 81 mg by mouth daily.     Ferrous Sulfate Dried (HIGH POTENCY IRON) 65 MG TABS Take 2 tablets by mouth daily.     lisinopril (ZESTRIL) 2.5 MG tablet Take 1 tablet (2.5 mg total) by mouth daily. 90 tablet 3   lovastatin (MEVACOR) 20 MG tablet TAKE 1 TABLET BY MOUTH AT BEDTIME 90 tablet 2   oxymetazoline (AFRIN)  0.05 % nasal spray Place 1 spray into both nostrils 2 (two) times daily.     Potassium 99 MG TABS Take 2 tablets by mouth every evening.     warfarin (COUMADIN) 5 MG tablet Take 1/2 to one tablet by mouth once daily as instructed by anticoagulation clinic. (Patient taking differently: Take 2.5-5 mg by mouth daily at 4 PM. Take 1/2 to one tablet by mouth once daily as instructed by anticoagulation clinic.) 90 tablet 0   No current facility-administered medications for this  visit.     Physical Exam: BP 120/70 (BP Location: Left Arm, Patient Position: Sitting)   Pulse 62   Resp 18   Ht 5\' 8"  (1.727 m)   Wt 171 lb (77.6 kg)   SpO2 99% Comment: RA  BMI 26.00 kg/m  He looks well. Cardiac exam shows a regular rate and rhythm with a crisp mechanical valve click.  There is no murmur. Lungs are clear.  Diagnostic Tests:  Narrative & Impression  CLINICAL DATA:  rule out dissection prior h/o   EXAM: CT ANGIOGRAPHY CHEST, ABDOMEN AND PELVIS   TECHNIQUE: Non-contrast CT of the chest was initially obtained.   Multidetector CT imaging through the chest, abdomen and pelvis was performed using the standard protocol during bolus administration of intravenous contrast. Multiplanar reconstructed images and MIPs were obtained and reviewed to evaluate the vascular anatomy.   RADIATION DOSE REDUCTION: This exam was performed according to the departmental dose-optimization program which includes automated exposure control, adjustment of the mA and/or kV according to patient size and/or use of iterative reconstruction technique.   CONTRAST:  OMNIPAQUE IOHEXOL 350 MG/ML SOLN   COMPARISON:  CT angio chest 09/28/2021, CT abdomen pelvis 01/31/2022   FINDINGS: CTA CHEST FINDINGS   Cardiovascular:   Preferential opacification of the thoracic aorta. 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 Status post aortic valve replacement. 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 The dissection and aneurysmal dilatation of the thoracic aorta extends through the abdominal aorta and terminates at the origin of the right external iliac and distal left common iliac arteries. Thoracic aortic arch measures up to 4.4 cm. The descending thoracic aorta measures up to 5.2 x 5.4 cm. Partial opacification of the false lumen  from retrograde flow inferiorly at the aortic bifurcation. Partially thrombosed false lumen of the thoracic aorta.   Enlarged heart size. No significant pericardial effusion. Severe atherosclerotic plaque of the thoracic aorta. No coronary artery calcifications.   Mediastinum/Nodes: No enlarged mediastinal, hilar, or axillary lymph nodes. Thyroid gland, trachea, and esophagus demonstrate no significant findings.   Lungs/Pleura: No focal consolidation. No pulmonary nodule. No pulmonary mass. No pleural effusion. No pneumothorax.   Musculoskeletal:   Sternotomy wires are intact.   No suspicious lytic or blastic osseous lesions. No acute displaced fracture. Multilevel degenerative changes of the spine.   Review of the MIP images confirms the above findings.   CTA ABDOMEN AND PELVIS FINDINGS   VASCULAR   Aorta: The dissection and aneurysmal dilatation of the thoracic aorta extends through the abdominal aorta (stable) and terminates at the origin of the right external iliac and distal left common iliac arteries. Suprarenal abdominal aorta measures up to 3.7 x 3.2 cm. Infrarenal abdominal aorta measures up to 3 x 2.7 cm. Severe atherosclerotic plaque.   Celiac: Originates from the true lumen. Patent without evidence of aneurysm, dissection, vasculitis  or significant stenosis.   SMA: Originates from the true lumen. Patent without evidence of aneurysm, dissection, vasculitis or significant stenosis.   Renals: The dissection flap extends into the origin of the right renal artery. The dissection flap extends into the left renal artery with a stenosis true lumen. No aneurysm.   IMA: Originates from the true lumen. Patent without evidence of aneurysm, dissection, vasculitis or significant stenosis.   Inflow: Dissection flap extends into the right common and for origin of the right external iliac artery. Dissection flap extends into the left common iliac artery. Otherwise patent  without evidence of aneurysm, dissection, vasculitis or significant stenosis.   Veins: No obvious venous abnormality within the limitations of this arterial phase study.   Review of the MIP images confirms the above findings.   NON-VASCULAR   Hepatobiliary: No focal liver abnormality. Status post cholecystectomy. No biliary dilatation.   Pancreas: No focal lesion. Normal pancreatic contour. No surrounding inflammatory changes. No main pancreatic ductal dilatation.   Spleen: Normal in size without focal abnormality.  Fluid   Adrenals/Urinary Tract:   No adrenal nodule bilaterally.   Bilateral kidneys enhance symmetrically. Density lesions likely represent simple renal cysts.   No hydronephrosis. No hydroureter.   The urinary bladder is unremarkable.   Stomach/Bowel: Stomach is within normal limits. No evidence of bowel wall thickening or dilatation. Colonic diverticulosis. Appendix appears normal.   Lymphatic: No lymphadenopathy.   Reproductive: The prostate is enlarged measuring up to 4.9 cm.   Other: Nonspecific misty mesentery of the small bowel mesentery. No intraperitoneal free fluid. No intraperitoneal free gas. No organized fluid collection.   Musculoskeletal:   Small to moderate volume fat contained left inguinal hernia.   No suspicious lytic or blastic osseous lesions. No acute displaced fracture. Severe L5-S1 degenerative changes with posterior disc osteophyte complex formation.   Review of the MIP images confirms the above findings.   IMPRESSION: 1. 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 The dissection and aneurysmal dilatation of the thoracic aorta extends through the abdominal aorta (stable) and terminates at the origin of the right external iliac and distal left common iliac arteries. Dissection flap  extends into the origin of the right renal artery as well as into the left renal artery with stenosis of the true lumen. Partial opacification of the false lumen from retrograde flow inferiorly at the aortic bifurcation. Partially thrombosed false lumen of the thoracic aorta. 2.  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 3. No findings to suggest organ ischemia. 4. Recommend semi-annual imaging followup by CTA or MRA and referral to cardiothoracic surgery if not already obtained. This recommendation follows 2010 ACCF/AHA/AATS/ACR/ASA/SCA/SCAI/SIR/STS/SVM Guidelines for the Diagnosis and Management of Patients With Thoracic Aortic Disease. Circulation. 2010; 121: [id number]. Aortic aneurysm NOS (ICD10-I71.9). 5. Other imaging findings of potential clinical significance: Cardiomegaly. Status post aortic valve replacement. Colonic diverticulosis with no acute diverticulitis. Prostatomegaly. Small to moderate volume fat contained left inguinal hernia. 6. Aortic Atherosclerosis (ICD10-I70.0). Aortic aneurysm NOS (ICD10-I71.9).   Electronically Signed: By: Tish Frederickson M.D. 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  This is not changed from the previous 2 scans.  There is a stable chronic residual dissection extending from the distal ascending aorta through the arch and down through the  thoracic and abdominal aorta.  The maximum diameter of the descending thoracic aorta is about 5.2 cm and unchanged.  There is partial thrombosis of the false lumen throughout the chest.  The major branch arteries come off of the true lumen.  I reviewed the CTA images with him and answered his questions.  I have recommended continued follow-up since his residual aorta appears stable in size and he has had previous aortic dissection repair with mechanical aortic valve in place.  I stressed the  importance of continued good blood pressure control in preventing further enlargement and recurrent aortic dissection.  The etiology of his intermittent chest discomfort is unclear but I doubt that is related to his aortic dissection or aneurysm.  It sounds more like ischemic pain although he has minimal coronary artery disease on his cardiac CT.  Plan:  I will plan to see him back in 1 year with a CTA of the chest, abdomen, and pelvis for aortic surveillance.  I spent 20 minutes performing this established patient evaluation and > 50% of this time was spent face to face counseling and coordinating the care of this patient's aortic aneurysm.    Alleen Borne, MD [facility] [phone]

## 2022-10-30 ENCOUNTER — Telehealth: Payer: Self-pay | Admitting: *Deleted

## 2022-10-30 NOTE — Telephone Encounter (Signed)
Left voicemail message to call back for review of results.  

## 2022-10-30 NOTE — Telephone Encounter (Signed)
-----   Message from Sondra Barges, PA-C sent at 10/29/2022  2:10 PM EDT ----- Cardiac overread: -Centimeters score 159 which was the 85th percentile -Minimal nonobstructive blockage involving the coronary arteries estimated at less than 25% -Asymmetric known aortic root aneurysm that is stable when compared to CTs in 2023 and 2024 -Known ascending aortic repair  -Known descending thoracic aortic aneurysm measuring up to 51 mm with chronic dissection and thrombosed false lumen  Noncardiac overread: -No acute or significant extracardiac or extravascular abnormality  Recommendations: -No evidence of blockages with known aortic root and ascending thoracic aortic aneurysm with known aortic dissection that is stable when compared to prior imaging -No acute findings -Continue current medical therapy and follow-up as planned -Ongoing surveillance of aortic aneurysm as directed by CVTS (last saw them 10/28/2022 with stable findings)

## 2022-11-02 NOTE — Telephone Encounter (Signed)
Reviewed results and recommendations with patient. He verbalized understanding of results but he still wants to know what is causing his chest pain stating that he has been dealing with this for years. I offered to schedule appointment for next week but he defers until his next appt in August. Reviewed strict ED precautions and to give [place] a call back if any further questions.

## 2022-11-04 ENCOUNTER — Other Ambulatory Visit (HOSPITAL_COMMUNITY): Payer: Medicaid Other

## 2022-11-04 DIAGNOSIS — M7541 Impingement syndrome of right shoulder: Secondary | ICD-10-CM | POA: Diagnosis not present

## 2022-11-25 ENCOUNTER — Ambulatory Visit: Payer: Medicaid Other | Attending: Internal Medicine

## 2022-11-25 DIAGNOSIS — Z952 Presence of prosthetic heart valve: Secondary | ICD-10-CM | POA: Diagnosis not present

## 2022-11-25 DIAGNOSIS — Z7901 Long term (current) use of anticoagulants: Secondary | ICD-10-CM | POA: Diagnosis not present

## 2022-11-25 DIAGNOSIS — Z5181 Encounter for therapeutic drug level monitoring: Secondary | ICD-10-CM | POA: Diagnosis not present

## 2022-11-25 LAB — POCT INR: INR: 3 (ref 2.0–3.0)

## 2022-11-25 NOTE — Patient Instructions (Signed)
Description   Continue on same dosage you have been taking 1/2 tablet every day EXCEPT 1 tablet on Mondays, Wednesdays and Fridays.   EAT GREENS 1 DAY PER WEEK;. [phone]  Recheck INR in 4 weeks

## 2022-12-23 ENCOUNTER — Ambulatory Visit: Payer: Medicaid Other | Attending: Internal Medicine

## 2022-12-23 DIAGNOSIS — Z7901 Long term (current) use of anticoagulants: Secondary | ICD-10-CM | POA: Diagnosis not present

## 2022-12-23 DIAGNOSIS — Z5181 Encounter for therapeutic drug level monitoring: Secondary | ICD-10-CM

## 2022-12-23 DIAGNOSIS — Z952 Presence of prosthetic heart valve: Secondary | ICD-10-CM

## 2022-12-23 LAB — POCT INR: INR: 4.2 — AB (ref 2.0–3.0)

## 2022-12-23 NOTE — Patient Instructions (Signed)
HOLD TODAY ONLY THEN Continue 1/2 tablet every day EXCEPT 1 tablet on Mondays, Wednesdays and Fridays.   EAT GREENS 1 DAY PER WEEK;. [phone]  Recheck INR in 3 weeks

## 2023-01-13 ENCOUNTER — Ambulatory Visit: Payer: PRIVATE HEALTH INSURANCE | Attending: Internal Medicine

## 2023-01-13 DIAGNOSIS — Z952 Presence of prosthetic heart valve: Secondary | ICD-10-CM | POA: Diagnosis not present

## 2023-01-13 DIAGNOSIS — Z5181 Encounter for therapeutic drug level monitoring: Secondary | ICD-10-CM | POA: Diagnosis not present

## 2023-01-13 DIAGNOSIS — Z7901 Long term (current) use of anticoagulants: Secondary | ICD-10-CM

## 2023-01-13 LAB — POCT INR: INR: 3.9 — AB (ref 2.0–3.0)

## 2023-01-13 NOTE — Patient Instructions (Signed)
HOLD TODAY ONLY THEN Decrease to  1/2 tablet every day EXCEPT 1 tablet on Mondays and Fridays.   EAT GREENS 1 DAY PER WEEK;. [phone]  Recheck INR in 3 weeks

## 2023-01-28 ENCOUNTER — Other Ambulatory Visit: Payer: Self-pay | Admitting: Nurse Practitioner

## 2023-01-29 NOTE — Telephone Encounter (Signed)
Requested Prescriptions  Pending Prescriptions Disp Refills   lovastatin (MEVACOR) 20 MG tablet [Pharmacy Med Name: Lovastatin 20 MG Oral Tablet] 90 tablet 0    Sig: TAKE 1 TABLET BY MOUTH AT BEDTIME     Cardiovascular:  Antilipid - Statins 2 Failed - 01/28/2023  8:11 AM      Failed - Lipid Panel in normal range within the last 12 months    Cholesterol, Total  Date Value Ref Range Status  09/29/2021 81 (L) 100 - 199 mg/dL Final   Cholesterol  Date Value Ref Range Status  10/12/2022 78 0 - 200 mg/dL Final   LDL Chol Calc (NIH)  Date Value Ref Range Status  09/29/2021 18 0 - 99 mg/dL Final   LDL Cholesterol  Date Value Ref Range Status  10/12/2022 40 0 - 99 mg/dL Final    Comment:           Total Cholesterol/HDL:CHD Risk Coronary Heart Disease Risk Table                     Men   Women  1/2 Average Risk   3.4   3.3  Average Risk       5.0   4.4  2 X Average Risk   9.6   7.1  3 X Average Risk  23.4   11.0        Use the calculated Patient Ratio above and the CHD Risk Table to determine the patient's CHD Risk.        ATP III CLASSIFICATION (LDL):  <100     mg/dL   Optimal  161-096  mg/dL   Near or Above                    Optimal  130-159  mg/dL   Borderline  045-409  mg/dL   High  >811     mg/dL   Very High Performed at [facility], [address] [place], [place] [address]    HDL  Date Value Ref Range Status  10/12/2022 30 (L) >40 mg/dL Final  29/56/2130 28 (L) >39 mg/dL Final   Triglycerides  Date Value Ref Range Status  10/12/2022 41 <150 mg/dL Final         Passed - Cr in normal range and within 360 days    Creatinine, Ser  Date Value Ref Range Status  10/12/2022 0.98 0.61 - 1.24 mg/dL Final         Passed - Patient is not pregnant      Passed - Valid encounter within last 12 months    Recent Outpatient Visits           5 months ago Encounter for removal of staples   [place] [facility] Mecum, Erin E, PA-C   5 months  ago Laceration of scalp without foreign body, subsequent encounter   [place] [facility] Mecum, Erin E, PA-C   6 months ago Acute pain of right shoulder   [place] [facility] Mecum, Oswaldo Conroy, PA-C   1 year ago Hospital discharge follow-up   [place] [facility] Larae Grooms, NP   1 year ago RUQ pain   [place] [facility] Larae Grooms, NP       Future Appointments             In 1 week End, Cristal Deer, MD [facility] at [place] [facility]

## 2023-02-03 ENCOUNTER — Telehealth: Payer: Self-pay

## 2023-02-03 ENCOUNTER — Ambulatory Visit: Payer: PRIVATE HEALTH INSURANCE

## 2023-02-03 NOTE — Telephone Encounter (Signed)
Lpmtcb and reschedule INR appt. 

## 2023-02-08 ENCOUNTER — Other Ambulatory Visit: Payer: Self-pay | Admitting: Nurse Practitioner

## 2023-02-09 NOTE — Telephone Encounter (Signed)
Requested Prescriptions  Refused Prescriptions Disp Refills   lovastatin (MEVACOR) 20 MG tablet [Pharmacy Med Name: Lovastatin 20 MG Oral Tablet] 90 tablet 0    Sig: TAKE 1 TABLET BY MOUTH AT BEDTIME     Cardiovascular:  Antilipid - Statins 2 Failed - 02/08/2023  6:44 AM      Failed - Lipid Panel in normal range within the last 12 months    Cholesterol, Total  Date Value Ref Range Status  09/29/2021 81 (L) 100 - 199 mg/dL Final   Cholesterol  Date Value Ref Range Status  10/12/2022 78 0 - 200 mg/dL Final   LDL Chol Calc (NIH)  Date Value Ref Range Status  09/29/2021 18 0 - 99 mg/dL Final   LDL Cholesterol  Date Value Ref Range Status  10/12/2022 40 0 - 99 mg/dL Final    Comment:           Total Cholesterol/HDL:CHD Risk Coronary Heart Disease Risk Table                     Men   Women  1/2 Average Risk   3.4   3.3  Average Risk       5.0   4.4  2 X Average Risk   9.6   7.1  3 X Average Risk  23.4   11.0        Use the calculated Patient Ratio above and the CHD Risk Table to determine the patient's CHD Risk.        ATP III CLASSIFICATION (LDL):  <100     mg/dL   Optimal  478-295  mg/dL   Near or Above                    Optimal  130-159  mg/dL   Borderline  621-308  mg/dL   High  >657     mg/dL   Very High Performed at [facility], [address] [place], [place] [address]    HDL  Date Value Ref Range Status  10/12/2022 30 (L) >40 mg/dL Final  29/52/8413 28 (L) >39 mg/dL Final   Triglycerides  Date Value Ref Range Status  10/12/2022 41 <150 mg/dL Final         Passed - Cr in normal range and within 360 days    Creatinine, Ser  Date Value Ref Range Status  10/12/2022 0.98 0.61 - 1.24 mg/dL Final         Passed - Patient is not pregnant      Passed - Valid encounter within last 12 months    Recent Outpatient Visits           5 months ago Encounter for removal of staples   [place] [facility] Mecum, Erin E, PA-C   6 months  ago Laceration of scalp without foreign body, subsequent encounter   [place] [facility] Mecum, Erin E, PA-C   6 months ago Acute pain of right shoulder   [place] [facility] Mecum, Oswaldo Conroy, PA-C   1 year ago Hospital discharge follow-up   [place] [facility] Larae Grooms, NP   1 year ago RUQ pain   [place] [facility] Larae Grooms, NP       Future Appointments             [place] End, Cristal Deer, MD [facility]

## 2023-02-10 ENCOUNTER — Ambulatory Visit: Payer: PRIVATE HEALTH INSURANCE | Attending: Internal Medicine | Admitting: Internal Medicine

## 2023-02-10 ENCOUNTER — Ambulatory Visit (INDEPENDENT_AMBULATORY_CARE_PROVIDER_SITE_OTHER): Payer: PRIVATE HEALTH INSURANCE

## 2023-02-10 ENCOUNTER — Encounter: Payer: Self-pay | Admitting: Internal Medicine

## 2023-02-10 VITALS — BP 104/58 | HR 47 | Ht 67.0 in | Wt 168.2 lb

## 2023-02-10 DIAGNOSIS — Z5181 Encounter for therapeutic drug level monitoring: Secondary | ICD-10-CM

## 2023-02-10 DIAGNOSIS — Z952 Presence of prosthetic heart valve: Secondary | ICD-10-CM | POA: Diagnosis not present

## 2023-02-10 DIAGNOSIS — Z7901 Long term (current) use of anticoagulants: Secondary | ICD-10-CM

## 2023-02-10 DIAGNOSIS — I480 Paroxysmal atrial fibrillation: Secondary | ICD-10-CM | POA: Diagnosis not present

## 2023-02-10 LAB — POCT INR: INR: 2.4 (ref 2.0–3.0)

## 2023-02-10 NOTE — Patient Instructions (Signed)
Medication Instructions:  No changes *If you need a refill on your cardiac medications before your next appointment, please call your pharmacy*   Lab Work: None ordered If you have labs (blood work) drawn today and your tests are completely normal, you will receive your results only by: [place] (if you have MyChart) OR A paper copy in the mail If you have any lab test that is abnormal or we need to change your treatment, we will call you to review the results.   Testing/Procedures: None ordered   Follow-Up: At [facility], you and your health needs are our priority.  As part of our continuing mission to provide you with exceptional heart care, we have created designated Provider Care Teams.  These Care Teams include your primary Cardiologist (physician) and Advanced Practice Providers (APPs -  Physician Assistants and Nurse Practitioners) who all work together to provide you with the care you need, when you need it.  We recommend signing up for the patient portal called "MyChart".  Sign up information is provided on this After Visit Summary.  MyChart is used to connect with patients for Virtual Visits (Telemedicine).  Patients are able to view lab/test results, encounter notes, upcoming appointments, etc.  Non-urgent messages can be sent to your provider as well.   To learn more about what you can do with MyChart, go to [email or web address].    Your next appointment:   6 month(s)  Provider:   You may see Nelva Bush, MD or one of the following Advanced Practice Providers on your designated Care Team:   Murray Hodgkins, NP Christell Faith, PA-C Cadence Kathlen Mody, PA-C Gerrie Nordmann, NP

## 2023-02-10 NOTE — Patient Instructions (Signed)
Continue 1/2 tablet every day EXCEPT 1 tablet on Mondays and Fridays.   EAT GREENS 2 DAY PER WEEK;. [phone]; Next week eat only 1 helping of greens;  Recheck INR in 3 weeks

## 2023-02-10 NOTE — Progress Notes (Signed)
Cardiology Office Note:  .   Date:  02/10/2023  ID:  Ryan [name], DOB [birth year], MRN [id number] PCP: Larae Grooms, NP  [facility] Providers Cardiologist:  Yvonne Kendall, MD     History of Present Illness: .   Ryan [name] is a 57 y.o. 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 Laneta Simmers, stroke/TIAs without residual deficits, paroxysmal atrial fibrillation, hypertension, hyperlipidemia, and obstructive sleep apnea (hasn't worn CPAP since contracted COVID-19 in 08/2019, who presents for follow-up of aortic disease and mechanical aortic valve as well as paroxysmal atrial fibrillation.  He was last seen in our office in May by [place], [place], at which time he was doing well.  He had been admitted in late April with chest pain and was noted to have minimal troponin elevation.  CTA chest showed stable thoracic aortic aneurysm and chronic dissection.  LVEF was 40-45%, similar to prior study.  Coronary CTA showed mild, nonobstructive CAD.  Today, Mr. Ryan [name] reports that he is feeling fairly well.  He notes a single episode of chest pain that felt like indigestion that occurred while he was riding his bike to work.  He has not had any other episodes of chest pain.  He has stable exertional dyspnea when riding his bike that has been present for years.  He denies palpitations, lightheadedness, edema, and bleeding.  Home blood pressures are typically 110-120 mmHg systolic.  ROS: See HPI  Studies Reviewed: Marland Kitchen   EKG Interpretation Date/Time:  Wednesday February 10 2023 11:01:18 EDT Ventricular Rate:  47 PR Interval:  158 QRS Duration:  158 QT Interval:  466 QTC Calculation: 412 R Axis:   16  Text Interpretation: Sinus bradycardia Left bundle branch block When compared with ECG of 27-Oct-2022 No  significant change was found Confirmed by Dametra Whetsel, Cristal Deer [phone]) on 02/10/2023 2:00:48 PM    Coronary CTA (10/23/2022): Coronary calcium score 159 (85th percentile).  Minimal CAD (less than 25%) involving the LAD, LCx, and RCA.  Descending thoracic aortic aneurysm measuring up to 5.1 cm with chronic dissection.  Evidence of ascending aortic repair.  Risk Assessment/Calculations:             Physical Exam:   VS:  BP (!) 104/58 (BP Location: Left Arm, Patient Position: Sitting, Cuff Size: Normal)   Pulse (!) 47   Ht 5\' 7"  (1.702 m)   Wt 168 lb 3.2 oz (76.3 kg)   SpO2 96%   BMI 26.34 kg/m    Wt Readings from Last 3 Encounters:  02/10/23 168 lb 3.2 oz (76.3 kg)  10/28/22 171 lb (77.6 kg)  10/27/22 171 lb (77.6 kg)    General:  NAD. Neck: No JVD or HJR. Lungs: Clear to auscultation bilaterally without wheezes or crackles. Heart: Regular rate and rhythm with 2/6 systolic murmur. Abdomen: Soft, nontender, nondistended. Extremities: Trace pretibial edema bilaterally.  ASSESSMENT AND PLAN: .    Aortic dissection status postrepair and mechanical aortic valve replacement: No symptoms reported.  CTA and echocardiogram in the spring showed stable findings.  Continue indefinite anticoagulation with warfarin (follow-up INR scheduled today).  Continue annual follow-up with Dr. Laneta Simmers.  Heart failure with mildly reduced ejection fraction: Follow-up echo in April showed stable LVEF of 40-45%.  Patient reports stable NYHA class II symptoms.  Continue low-dose lisinopril, as soft blood pressure precludes  escalation at this time.  Defer beta-blocker in the setting of resting bradycardia.  Paroxysmal atrial fibrillation: EKG today shows sinus bradycardia and left bundle branch block, both chronic findings.  Continue anticoagulation with warfarin.  No further intervention at this time.  Atypical chest pain: No significant chest pain noted other than single episode of atypical chest discomfort most  consistent with indigestion.  Coronary CTA in May showed minimal CAD and stable descending thoracic aortic aneurysm and chronic dissection with false lumen.  No further workup recommended at this time.  Hypertension: Blood pressure well-controlled on low-dose lisinopril.  Hyperlipidemia: LDL very well-controlled on low intensity statin therapy on last check in April.  Defer medication changes at this time.    Dispo: Return to clinic in 6 months.  Signed, Yvonne Kendall, MD

## 2023-03-03 ENCOUNTER — Telehealth: Payer: Self-pay

## 2023-03-03 ENCOUNTER — Ambulatory Visit: Payer: PRIVATE HEALTH INSURANCE

## 2023-03-03 ENCOUNTER — Encounter: Payer: Self-pay | Admitting: Internal Medicine

## 2023-03-03 NOTE — Telephone Encounter (Signed)
Lpmtcb to reschedule missed INR appt

## 2023-03-09 ENCOUNTER — Encounter: Payer: Self-pay | Admitting: Nurse Practitioner

## 2023-03-09 ENCOUNTER — Telehealth: Payer: Self-pay | Admitting: Internal Medicine

## 2023-03-09 ENCOUNTER — Ambulatory Visit: Payer: Medicaid Other | Admitting: Nurse Practitioner

## 2023-03-09 VITALS — BP 105/59 | HR 50 | Temp 97.7°F | Ht 67.0 in | Wt 167.8 lb

## 2023-03-09 DIAGNOSIS — B029 Zoster without complications: Secondary | ICD-10-CM | POA: Insufficient documentation

## 2023-03-09 MED ORDER — VALACYCLOVIR HCL 1 G PO TABS: 1000.0000 mg | ORAL_TABLET | Freq: Three times a day (TID) | ORAL | 0 refills | Status: AC

## 2023-03-09 NOTE — Assessment & Plan Note (Signed)
Currently presenting to abdomen and back.  Started with discomfort 5 days ago, mild rash starting today on exam.  Start Valtrex TID for 10 days, reviewed and does not interact with Coumadin.  Educated him on shingles and treatment.  Work note provided.  May take Tylenol at home as needed for pain.

## 2023-03-09 NOTE — Progress Notes (Signed)
BP (!) 105/59   Pulse (!) 50   Temp 97.7 F (36.5 C) (Oral)   Ht 5\' 7"  (1.702 m)   Wt 167 lb 12.8 oz (76.1 kg)   SpO2 98%   BMI 26.28 kg/m    Subjective:    Patient ID: Ryan [name], male    DOB: [birth year], 57 y.o.   MRN: [id number]  HPI: Ryan [name] is a 57 y.o. male  Chief Complaint  Patient presents with   Skin Problem    Patient states he has been experiencing a burning sensation and itching on his chest and back for the last 5 days. No rashes per patient.    PRURITUS AND BURNING Started 5 days ago to upper abdomen and around to back right side.  Has not noticed rashes as of yet.  Has not had shingles vaccinations.  Took Tylenol yesterday due to mild headache.  Started to feel drained when these symptoms started.  Duration:  days  Location:  back and trunk  Itching: yes Burning: yes Redness: no Oozing: no Scaling: no Blisters: no Painful: yes Fevers: felt a little feverish yesterday Change in detergents/soaps/personal care products: no Recent illness: no Recent travel:no History of same: no Context: stable Alleviating factors: Aveeno  Treatments attempted: Aveeno Shortness of breath: no  Throat/tongue swelling: no Myalgias/arthralgias: no  Relevant past medical, surgical, family and social history reviewed and updated as indicated. Interim medical history since our last visit reviewed. Allergies and medications reviewed and updated.  Review of Systems  Constitutional:  Positive for fatigue and fever (mild yesterday). Negative for activity change and diaphoresis.  Respiratory:  Negative for cough, chest tightness, shortness of breath and wheezing.   Cardiovascular:  Negative for chest pain, palpitations and leg swelling.  Gastrointestinal: Negative.   Skin:  Negative for rash (no rash but is having burning and itching from anterior abdomen to around right back).  Neurological: Negative.   Psychiatric/Behavioral: Negative.      Per HPI unless specifically  indicated above     Objective:    BP (!) 105/59   Pulse (!) 50   Temp 97.7 F (36.5 C) (Oral)   Ht 5\' 7"  (1.702 m)   Wt 167 lb 12.8 oz (76.1 kg)   SpO2 98%   BMI 26.28 kg/m   Wt Readings from Last 3 Encounters:  03/09/23 167 lb 12.8 oz (76.1 kg)  02/10/23 168 lb 3.2 oz (76.3 kg)  10/28/22 171 lb (77.6 kg)    Physical Exam Vitals and nursing note reviewed.  Constitutional:      General: He is awake. He is not in acute distress.    Appearance: He is well-developed and well-groomed. He is not ill-appearing or toxic-appearing.  HENT:     Head: Normocephalic.     Right Ear: Hearing and external ear normal.     Left Ear: Hearing and external ear normal.  Eyes:     General: Lids are normal.     Extraocular Movements: Extraocular movements intact.     Conjunctiva/sclera: Conjunctivae normal.  Neck:     Thyroid: No thyromegaly.     Vascular: No carotid bruit.  Cardiovascular:     Rate and Rhythm: Regular rhythm. Bradycardia present.     Heart sounds: Normal heart sounds. No murmur heard.    No gallop.  Pulmonary:     Effort: No accessory muscle usage or respiratory distress.     Breath sounds: Normal breath sounds.  Abdominal:  General: Bowel sounds are normal. There is no distension.     Palpations: Abdomen is soft.     Tenderness: There is no abdominal tenderness.  Musculoskeletal:     Cervical back: Full passive range of motion without pain.     Right lower leg: No edema.     Left lower leg: No edema.  Lymphadenopathy:     Cervical: No cervical adenopathy.  Skin:    General: Skin is warm.     Capillary Refill: Capillary refill takes less than 2 seconds.     Findings: Rash present.     Comments: A very mild patch of erythema present to upper right abdomen and another one noted to mid right back.  No vesicles.  Neurological:     Mental Status: He is alert and oriented to person, place, and time.     Deep Tendon Reflexes: Reflexes are normal and symmetric.      Reflex Scores:      Brachioradialis reflexes are 2+ on the right side and 2+ on the left side.      Patellar reflexes are 2+ on the right side and 2+ on the left side. Psychiatric:        Attention and Perception: Attention normal.        Mood and Affect: Mood normal.        Speech: Speech normal.        Behavior: Behavior normal. Behavior is cooperative.        Thought Content: Thought content normal.     Results for orders placed or performed in visit on 02/10/23  POCT INR  Result Value Ref Range   INR 2.4 2.0 - 3.0   POC INR        Assessment & Plan:   Problem List Items Addressed This Visit       Nervous and Auditory   Shingles - Primary    Currently presenting to abdomen and back.  Started with discomfort 5 days ago, mild rash starting today on exam.  Start Valtrex TID for 10 days, reviewed and does not interact with Coumadin.  Educated him on shingles and treatment.  Work note provided.  May take Tylenol at home as needed for pain.      Relevant Medications   valACYclovir (VALTREX) 1000 MG tablet     Follow up plan: Return in about 2 weeks (around 03/23/2023) for Shingles.

## 2023-03-09 NOTE — Patient Instructions (Signed)

## 2023-03-09 NOTE — Telephone Encounter (Signed)
Pt c/o medication issue:  1. Name of Medication:    2. How are you currently taking this medication (dosage and times per day)?    3. Are you having a reaction (difficulty breathing--STAT)? no  4. What is your medication issue? Patient has started new medication, calling to verify the medication wont effect his coumadin. Please advise

## 2023-03-09 NOTE — Telephone Encounter (Signed)
Returned pt's call and made him aware the new medication (Valtrex) should not interact with Warfarin; however, he has appt with [facility] in [place] on 03/17/23 and INR will be checked. Pt verbalize understanding.

## 2023-03-17 ENCOUNTER — Ambulatory Visit: Payer: PRIVATE HEALTH INSURANCE | Attending: Internal Medicine

## 2023-03-17 DIAGNOSIS — Z7901 Long term (current) use of anticoagulants: Secondary | ICD-10-CM | POA: Diagnosis not present

## 2023-03-17 DIAGNOSIS — Z5181 Encounter for therapeutic drug level monitoring: Secondary | ICD-10-CM

## 2023-03-17 DIAGNOSIS — Z952 Presence of prosthetic heart valve: Secondary | ICD-10-CM | POA: Diagnosis not present

## 2023-03-17 LAB — POCT INR: INR: 2.6 (ref 2.0–3.0)

## 2023-03-17 NOTE — Patient Instructions (Signed)
Continue 1/2 tablet every day EXCEPT 1 tablet on Mondays and Fridays.   EAT GREENS 2 DAY PER WEEK;. [phone];  Recheck INR in 4 weeks

## 2023-03-23 ENCOUNTER — Ambulatory Visit: Payer: PRIVATE HEALTH INSURANCE | Admitting: Nurse Practitioner

## 2023-04-02 ENCOUNTER — Ambulatory Visit: Payer: Self-pay

## 2023-04-02 ENCOUNTER — Emergency Department
Admission: EM | Admit: 2023-04-02 | Discharge: 2023-04-02 | Disposition: A | Discharge status: Home / Self Care | Payer: PRIVATE HEALTH INSURANCE | Attending: Emergency Medicine | Admitting: Emergency Medicine

## 2023-04-02 ENCOUNTER — Other Ambulatory Visit: Payer: Self-pay

## 2023-04-02 ENCOUNTER — Ambulatory Visit: Payer: Medicaid Other | Admitting: Family Medicine

## 2023-04-02 VITALS — BP 118/69 | HR 52 | Temp 97.9°F | Ht 67.32 in | Wt 166.6 lb

## 2023-04-02 DIAGNOSIS — R040 Epistaxis: Secondary | ICD-10-CM | POA: Diagnosis not present

## 2023-04-02 LAB — PROTIME-INR
INR: 2.3 — ABNORMAL HIGH (ref 0.8–1.2)
Prothrombin Time: 25.4 s — ABNORMAL HIGH (ref 11.4–15.2)

## 2023-04-02 MED ORDER — AMOXICILLIN-POT CLAVULANATE 875-125 MG PO TABS
1.0000 | ORAL_TABLET | Freq: Once | ORAL | Status: AC
  Administered 2023-04-02: 1 via ORAL
  Filled 2023-04-02: qty 1

## 2023-04-02 MED ORDER — AMOXICILLIN-POT CLAVULANATE 875-125 MG PO TABS: 1.0000 | ORAL_TABLET | Freq: Two times a day (BID) | ORAL | 0 refills | Status: AC

## 2023-04-02 MED ORDER — SALINE SPRAY 0.65 % NA SOLN: 1.0000 | NASAL | 0 refills | Status: DC | PRN

## 2023-04-02 NOTE — ED Provider Notes (Signed)
[facility] Provider Note    Event Date/Time   First MD Initiated Contact with Patient 04/02/23 0014     (approximate)   History   Epistaxis   HPI  Ryan [name] is a 57 y.o. male reports a history of cardiac valve and previous dissection.  He is currently on Coumadin.  He has a history of nosebleeds and has had to have nasal tampons or Rhino Rocket's in the past  This evening he started having bleeding out of his left nare, he reports it was quite a bit.  He attempted to [facility] at home with gauze, but it did not.  EMS provided Afrin and a nasal clamp which she reports has improved but still feeling some bleeding  After having the nasal clamp on for approximately 20 minutes, it was removed, and he still having bleeding to stop the left side of the nose.  He had his Coumadin checked about a week ago he believes the level was appropriate.  He has not been suffering any other medical conditions no high blood pressure or other issues     Physical Exam   Triage Vital Signs: ED Triage Vitals  Encounter Vitals Group     BP 04/02/23 0027 (!) 140/86     Systolic BP Percentile --      Diastolic BP Percentile --      Pulse Rate 04/02/23 0027 (!) 56     Resp 04/02/23 0027 17     Temp 04/02/23 0027 (!) 96.4 F (35.8 C)     Temp Source 04/02/23 0027 Axillary     SpO2 04/02/23 0027 98 %     Weight 04/02/23 0019 163 lb (73.9 kg)     Height 04/02/23 0019 5\' 7"  (1.702 m)     Head Circumference --      Peak Flow --      Pain Score 04/02/23 0013 0     Pain Loc --      Pain Education --      Exclude from Growth Chart --     Most recent vital signs: Vitals:   04/02/23 0027  BP: (!) 140/86  Pulse: (!) 56  Resp: 17  Temp: (!) 96.4 F (35.8 C)  SpO2: 98%     General: Awake, no distress.  CV:  Good peripheral perfusion.  Resp:  Normal effort.  Abd:  No distention.  Other:  Right nare very slight amount of epistaxis, dried.  Left nare with clamp removed  shows active epistaxis from what appears to be the mid left anterior turbinate region.  Somewhat hard to exactly identify the area of bleeding but small amount of bleeding is still present despite Afrin, nasal clamping  Managing secretions well.  Not spitting up any blood.  Not coughing up any blood.  No blood noted in the posterior oropharynx  ED Results / Procedures / Treatments   Labs (all labs ordered are listed, but only abnormal results are displayed) Labs Reviewed  PROTIME-INR - Abnormal; Notable for the following components:      Result Value   Prothrombin Time 25.4 (*)    INR 2.3 (*)    All other components within normal limits     EKG     RADIOLOGY     PROCEDURES:  Critical Care performed: No  .Epistaxis Management  Date/Time: 04/02/2023 12:55 AM  Performed by: Sharyn Creamer, MD Authorized by: Sharyn Creamer, MD   Consent:    Consent obtained:  Verbal  Consent given by:  Patient   Risks, benefits, and alternatives were discussed: yes     Risks discussed:  Bleeding, infection, nasal injury and pain   Alternatives discussed:  Observation Universal protocol:    Procedure explained and questions answered to patient or proxy's satisfaction: yes     Relevant documents present and verified: yes     Test results available: yes     Required blood products, implants, devices, and special equipment available: yes     Patient identity confirmed:  Verbally with patient Anesthesia:    Anesthesia method:  None Procedure details:    Treatment site:  L anterior   Treatment method:  Nasal balloon (rhinorocket 7.5cm ant/post)   Treatment complexity:  Limited   Treatment episode: initial   Post-procedure details:    Assessment:  Bleeding decreased Comments:     Patient tolerated procedure well.  No complications.  This taxis appears to have improved rapidly.  Will observe    MEDICATIONS ORDERED IN ED: Medications  amoxicillin-clavulanate (AUGMENTIN) 875-125 MG per  tablet 1 tablet (1 tablet Oral Given 04/02/23 0100)     IMPRESSION / MDM / ASSESSMENT AND PLAN / ED COURSE  I reviewed the triage vital signs and the nursing notes.                              Differential diagnosis includes, but is not limited to, epistaxis, appears to be coming from the left nare, most likely anterior.  He is on Coumadin.  INR is not supratherpeutic, his blood pressure is relatively well-controlled.  He has no other associated symptoms.  There is no injury.  He has a history of epistaxis in the past.  Discussed with the patient return precautions, need for close follow-up with ENT which she will schedule for early this coming week, signs and symptoms to suggest need for return, bleeding precautions, etc.  Patient's presentation is most consistent with acute complicated illness / injury requiring diagnostic workup.   ----------------------------------------- 1:33 AM on 04/02/2023 ----------------------------------------- Resting comfortably.  Rhino Rocket in place.  Augmentin given and discussed with patient plan for antibiotic therapy and close follow-up with ENT.  Careful return precautions as well as bleeding precautions discussed.  Patient resting comfortably at this time without evidence of ongoing epistaxis except he reports saw 1-2 drops of blood earlier, appears to be stopped now.       FINAL CLINICAL IMPRESSION(S) / ED DIAGNOSES   Final diagnoses:  Epistaxis     Rx / DC Orders   ED Discharge Orders          Ordered    amoxicillin-clavulanate (AUGMENTIN) 875-125 MG tablet  2 times daily        04/02/23 0055             Note:  This document was prepared using Dragon voice recognition software and may include unintentional dictation errors.   Sharyn Creamer, MD 04/02/23 [phone]

## 2023-04-02 NOTE — ED Triage Notes (Signed)
PT has uncontrolled nose bleed tonight. (+) blood thinner for previous strokes/TIAs/MI in recent years. Afrin given by EMS and nose clamped. No active bleeding at this time. Pt denies weakness/dizziness/swallowing blood. SBP in 140s for EMS.

## 2023-04-02 NOTE — Assessment & Plan Note (Signed)
Acute, stable. CBC, CMP, INR/PT done. Recommend use of saline nasal spray and saline nasal gel to prevent nose from dryness. Instructed if bleeding occurs again apply Afrin nasal spray to cotton ball and pack nostrils to stop bleeding. Instructed to return to ED if uncontrollable bleeding occurs. Return in 1 week, call sooner if concerns arise.

## 2023-04-02 NOTE — ED Notes (Addendum)
MD at bedside. 

## 2023-04-02 NOTE — Progress Notes (Signed)
BP 118/69   Pulse (!) 52   Temp 97.9 F (36.6 C) (Oral)   Ht 5' 7.32" (1.71 m)   Wt 166 lb 9.6 oz (75.6 kg)   SpO2 96%   BMI 25.84 kg/m    Subjective:    Patient ID: Ryan [name], male    DOB: 05-18-1966, 57 y.o.   MRN: [id number]  HPI: Ryan [name] is a 57 y.o. male  Chief Complaint  Patient presents with   Epistaxis    On lifelong coumadin   HPI:  Currently taking lifelong coumadin for mechanical AVR. He is managed by the coumadin clinic, will return on 04/14/2023. He is currently taking coumadin M and F 5 MG, and 2.5 MG the other days.  Started experiencing nose bleeds last night, EMS called gave Afrin nasal spray and nasal clamp. Bleeding stopped. Patient sneezed, started bleeding again, went to ED. ED placed nasal packing and INR 2.3. After being discharged from ED, patient sneezed causing nasal packing to come out but denies nasal bleeding. He admits to waking up this morning feeling light headed and was told by his roommate that he looks pale. Denies hematuria, blood in stool, and bruising.    Goal:  2.5-3.0 Excessive bruising: no Nose bleeding: yes Rectal bleeding: no Eating diet with consistent amounts of foods containing Vitamin K:no Any recent antibiotic use? no  Relevant past medical, surgical, family and social history reviewed and updated as indicated. Interim medical history since our last visit reviewed. Allergies and medications reviewed and updated.  ROS: Per HPI unless specifically indicated above     Objective:    BP 118/69   Pulse (!) 52   Temp 97.9 F (36.6 C) (Oral)   Ht 5' 7.32" (1.71 m)   Wt 166 lb 9.6 oz (75.6 kg)   SpO2 96%   BMI 25.84 kg/m   Wt Readings from Last 3 Encounters:  04/02/23 166 lb 9.6 oz (75.6 kg)  04/02/23 163 lb (73.9 kg)  03/09/23 167 lb 12.8 oz (76.1 kg)     Last CBC:  Lab Results  Component Value Date   WBC 4.9 10/12/2022   HGB 12.5 (L) 10/12/2022   HCT 37.8 (L) 10/12/2022   MCV 94.5 10/12/2022   PLT 145 (L)  10/12/2022    Results for orders placed or performed during the hospital encounter of 04/02/23  Protime-INR  Result Value Ref Range   Prothrombin Time 25.4 (H) 11.4 - 15.2 seconds   INR 2.3 (H) 0.8 - 1.2      Review of Systems  HENT:  Positive for nosebleeds. Negative for sinus pain.   Respiratory: Negative.    Cardiovascular: Negative.   Gastrointestinal:  Negative for blood in stool.  Genitourinary:  Negative for hematuria.  Skin:  Negative for color change.    Per HPI unless specifically indicated above     Objective:    BP 118/69   Pulse (!) 52   Temp 97.9 F (36.6 C) (Oral)   Ht 5' 7.32" (1.71 m)   Wt 166 lb 9.6 oz (75.6 kg)   SpO2 96%   BMI 25.84 kg/m   Wt Readings from Last 3 Encounters:  04/02/23 166 lb 9.6 oz (75.6 kg)  04/02/23 163 lb (73.9 kg)  03/09/23 167 lb 12.8 oz (76.1 kg)    Physical Exam Vitals and nursing note reviewed.  Constitutional:      General: He is awake. He is not in acute distress.    Appearance: Normal  appearance. He is well-developed and well-groomed. He is not ill-appearing.  HENT:     Head: Normocephalic and atraumatic.     Right Ear: Hearing and external ear normal. No drainage.     Left Ear: Hearing and external ear normal. No drainage.     Nose:     Right Nostril: Epistaxis (dried blood) present.     Left Nostril: Epistaxis (dried blood) present.     Right Turbinates: Not swollen or pale.     Left Turbinates: Not swollen or pale.  Eyes:     General: Lids are normal.        Right eye: No discharge.        Left eye: No discharge.     Conjunctiva/sclera: Conjunctivae normal.  Cardiovascular:     Rate and Rhythm: Regular rhythm. Bradycardia present.     Pulses:          Radial pulses are 2+ on the right side and 2+ on the left side.       Posterior tibial pulses are 2+ on the right side and 2+ on the left side.     Heart sounds: Normal heart sounds, S1 normal and S2 normal. No murmur heard.    No gallop.  Pulmonary:      Effort: Pulmonary effort is normal. No accessory muscle usage or respiratory distress.     Breath sounds: Normal breath sounds.  Musculoskeletal:        General: Normal range of motion.     Cervical back: Full passive range of motion without pain and normal range of motion.     Right lower leg: No edema.     Left lower leg: No edema.  Skin:    General: Skin is warm and dry.     Capillary Refill: Capillary refill takes less than 2 seconds.  Neurological:     Mental Status: He is alert and oriented to person, place, and time.  Psychiatric:        Attention and Perception: Attention normal.        Mood and Affect: Mood normal.        Speech: Speech normal.        Behavior: Behavior normal. Behavior is cooperative.        Thought Content: Thought content normal.     Results for orders placed or performed during the hospital encounter of 04/02/23  Protime-INR  Result Value Ref Range   Prothrombin Time 25.4 (H) 11.4 - 15.2 seconds   INR 2.3 (H) 0.8 - 1.2      Assessment & Plan:   Problem List Items Addressed This Visit     Epistaxis - Primary    Acute, stable. CBC, CMP, INR/PT done. Recommend use of saline nasal spray and saline nasal gel to prevent nose from dryness. Instructed if bleeding occurs again apply Afrin nasal spray to cotton ball and pack nostrils to stop bleeding. Instructed to return to ED if uncontrollable bleeding occurs. Return in 1 week, call sooner if concerns arise.       Relevant Medications   sodium chloride (OCEAN) 0.65 % SOLN nasal spray   Other Relevant Orders   CBC w/Diff   INR/PT   Comp Met (CMET)     Follow up plan: Return in about 1 week (around 04/09/2023) for physical.

## 2023-04-02 NOTE — Patient Instructions (Addendum)
Ayr saline nasal gel in the nose  Nasal saline spray x3 times daily   If start to bleed again spray Afrin on cotton ball and pack nose with this.

## 2023-04-02 NOTE — Telephone Encounter (Signed)
  Chief Complaint: Nose bleed - on blood thinners, Pale, and HA Symptoms: above Frequency: last night  Pertinent Negatives: Patient denies current nose bleed Disposition: [] ED /[] Urgent Care (no appt availability in office) / [x] Appointment(In office/virtual)/ []  [facility]/ [] Home Care/ [] Refused Recommended Disposition /[] [facility]/ []  Follow-up with PCP Additional Notes: Pt had 2 nose bleeds last night. EMS was called for both. The 2nd nose bleed pt was taken to ED. Nose was packed. Pt reports not sleeping well d/t packing. Pt sneezed out packing today. Pt states he has a HA and his roommate states he looks pale.  Reason for Disposition  [1] Large amount of blood has been lost (e.g., 1 cup or 240 ml) AND [2] bleeding now controlled (stopped)  Answer Assessment - Initial Assessment Questions 1. AMOUNT OF BLEEDING: "How bad is the bleeding?" "How much blood was lost?" "Has the bleeding stopped?"   - MILD: needed a couple tissues   - MODERATE: needed many tissues   - SEVERE: large blood clots, soaked many tissues, lasted more than 30 minutes      Bleeding has stopped 2. ONSET: "When did the nosebleed start?"      Last night 3. FREQUENCY: "How many nosebleeds have you had in the last 24 hours?"      2 4. RECURRENT SYMPTOMS: "Have there been other recent nosebleeds?" If Yes, ask: "How long did it take you to stop the bleeding?" "What worked best?"      yes 5. CAUSE: "What do you think caused this nosebleed?"     Sneezing and coumadin 7. SYSTEMIC FACTORS: "Do you have high blood pressure or any bleeding problems?"     HTN 8. BLOOD THINNERS: "Do you take any blood thinners?" (e.g., aspirin, clopidogrel / Plavix, coumadin, heparin). Notes: Other strong blood thinners include: Arixtra (fondaparinux), Eliquis (apixaban), Pradaxa (dabigatran), and Xarelto (rivaroxaban).     Yes 9. OTHER SYMPTOMS: "Do you have any other symptoms?" (e.g., lightheadedness)     HA,  Pale  Protocols used: Nosebleed-A-AH

## 2023-04-03 LAB — CBC WITH DIFFERENTIAL/PLATELET
Basophils Absolute: 0 10*3/uL (ref 0.0–0.2)
Basos: 0 %
EOS (ABSOLUTE): 0.3 10*3/uL (ref 0.0–0.4)
Eos: 5 %
Hematocrit: 40.7 % (ref 37.5–51.0)
Hemoglobin: 13.8 g/dL (ref 13.0–17.7)
Immature Grans (Abs): 0 10*3/uL (ref 0.0–0.1)
Immature Granulocytes: 0 %
Lymphocytes Absolute: 1 10*3/uL (ref 0.7–3.1)
Lymphs: 20 %
MCH: 31.7 pg (ref 26.6–33.0)
MCHC: 33.9 g/dL (ref 31.5–35.7)
MCV: 94 fL (ref 79–97)
Monocytes Absolute: 0.3 10*3/uL (ref 0.1–0.9)
Monocytes: 6 %
Neutrophils Absolute: 3.5 10*3/uL (ref 1.4–7.0)
Neutrophils: 69 %
Platelets: 148 10*3/uL — ABNORMAL LOW (ref 150–450)
RBC: 4.35 x10E6/uL (ref 4.14–5.80)
RDW: 13.7 % (ref 11.6–15.4)
WBC: 5.1 10*3/uL (ref 3.4–10.8)

## 2023-04-03 LAB — COMPREHENSIVE METABOLIC PANEL
ALT: 12 [IU]/L (ref 0–44)
AST: 19 [IU]/L (ref 0–40)
Albumin: 4 g/dL (ref 3.8–4.9)
Alkaline Phosphatase: 64 [IU]/L (ref 44–121)
BUN/Creatinine Ratio: 8 — ABNORMAL LOW (ref 9–20)
BUN: 9 mg/dL (ref 6–24)
Bilirubin Total: 0.8 mg/dL (ref 0.0–1.2)
CO2: 20 mmol/L (ref 20–29)
Calcium: 8.3 mg/dL — ABNORMAL LOW (ref 8.7–10.2)
Chloride: 107 mmol/L — ABNORMAL HIGH (ref 96–106)
Creatinine, Ser: 1.08 mg/dL (ref 0.76–1.27)
Globulin, Total: 2.1 g/dL (ref 1.5–4.5)
Glucose: 125 mg/dL — ABNORMAL HIGH (ref 70–99)
Potassium: 3.7 mmol/L (ref 3.5–5.2)
Sodium: 141 mmol/L (ref 134–144)
Total Protein: 6.1 g/dL (ref 6.0–8.5)
eGFR: 81 mL/min/{1.73_m2} (ref >59)

## 2023-04-03 LAB — PROTIME-INR
INR: 2.2 — ABNORMAL HIGH (ref 0.9–1.2)
Prothrombin Time: 23 s — ABNORMAL HIGH (ref 9.1–12.0)

## 2023-04-03 NOTE — Plan of Care (Signed)
[facility] Tonsillectomy/Adenoidectomy, Postoperative PEDS care plan entered in error.

## 2023-04-05 NOTE — Progress Notes (Signed)
Hi Jt, your electrolytes returned as stable, kidney and liver function normal. Electrolytes remain stable. Your CBC results are normal, no indications of bleeding, platelets remain stable. Your INR and PT remains low not at goal. Continue taking medication as prescribed, we will recheck these levels at your next visit. Thank you for allowing me to participate in your care.

## 2023-04-06 ENCOUNTER — Ambulatory Visit (INDEPENDENT_AMBULATORY_CARE_PROVIDER_SITE_OTHER): Payer: PRIVATE HEALTH INSURANCE | Admitting: Family Medicine

## 2023-04-06 ENCOUNTER — Encounter: Payer: Self-pay | Admitting: Family Medicine

## 2023-04-06 VITALS — BP 116/70 | HR 49 | Temp 97.6°F | Ht 67.32 in | Wt 166.4 lb

## 2023-04-06 DIAGNOSIS — R040 Epistaxis: Secondary | ICD-10-CM | POA: Diagnosis not present

## 2023-04-06 DIAGNOSIS — E7800 Pure hypercholesterolemia, unspecified: Secondary | ICD-10-CM

## 2023-04-06 DIAGNOSIS — Z7901 Long term (current) use of anticoagulants: Secondary | ICD-10-CM

## 2023-04-06 DIAGNOSIS — Z0000 Encounter for general adult medical examination without abnormal findings: Secondary | ICD-10-CM

## 2023-04-06 DIAGNOSIS — H6123 Impacted cerumen, bilateral: Secondary | ICD-10-CM

## 2023-04-06 DIAGNOSIS — N400 Enlarged prostate without lower urinary tract symptoms: Secondary | ICD-10-CM

## 2023-04-06 DIAGNOSIS — F321 Major depressive disorder, single episode, moderate: Secondary | ICD-10-CM

## 2023-04-06 DIAGNOSIS — F419 Anxiety disorder, unspecified: Secondary | ICD-10-CM | POA: Diagnosis not present

## 2023-04-06 DIAGNOSIS — Z1159 Encounter for screening for other viral diseases: Secondary | ICD-10-CM

## 2023-04-06 DIAGNOSIS — R739 Hyperglycemia, unspecified: Secondary | ICD-10-CM

## 2023-04-06 DIAGNOSIS — I10 Essential (primary) hypertension: Secondary | ICD-10-CM

## 2023-04-06 LAB — URINALYSIS, ROUTINE W REFLEX MICROSCOPIC
Bilirubin, UA: NEGATIVE
Glucose, UA: NEGATIVE
Ketones, UA: NEGATIVE
Leukocytes,UA: NEGATIVE
Nitrite, UA: NEGATIVE
Protein,UA: NEGATIVE
Specific Gravity, UA: 1.015 (ref 1.005–1.030)
Urobilinogen, Ur: 0.2 mg/dL (ref 0.2–1.0)
pH, UA: 6 (ref 5.0–7.5)

## 2023-04-06 LAB — MICROSCOPIC EXAMINATION
Bacteria, UA: NONE SEEN
WBC, UA: NONE SEEN /[HPF] (ref 0–5)

## 2023-04-06 MED ORDER — HYDROXYZINE PAMOATE 25 MG PO CAPS: 25.0000 mg | ORAL_CAPSULE | Freq: Two times a day (BID) | ORAL | 1 refills | Status: DC | PRN

## 2023-04-06 NOTE — Assessment & Plan Note (Addendum)
Chronic, stable. INR/PT done today, last check 3 days ago at  2.3. Nosebleeds have resolved. Taking 5 MG Coumadin on Mondays and Fridays and 2.5 MG other days of the week. Managed by cardiology, will see them 04/14/2023.

## 2023-04-06 NOTE — Assessment & Plan Note (Addendum)
Acute, ongoing. 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 Referral placed for psychologist at [facility] in [place], Hydroxyzine 25 MG given BID PRN, provided Suicide crises line number, educated to continue hobbies and activities that provide relaxation. Return in 1 month, call sooner if concerns arise.

## 2023-04-06 NOTE — Assessment & Plan Note (Signed)
Chronic, controlled. Managed by Cardiology, continue taking Lisinopril 20 MG daily. Recommend to start writing BP readings in log and bring to your next visit.

## 2023-04-06 NOTE — Assessment & Plan Note (Signed)
Acute, ongoing. PHQ 9:13 , moderate depression, along with SI thoughts. Referral placed for psychologist at [facility] in [place], Hydroxyzine 25 MG given BID PRN, provided Suicide crises line number, educated to continue hobbies and activities that provide relaxation. Return in 1 month, call sooner if concerns arise.

## 2023-04-06 NOTE — Assessment & Plan Note (Deleted)
Acute, resolved. INR/PT done. Recommend use of saline nasal spray and saline nasal gel to prevent nose from dryness. Instructed if bleeding occurs again apply Afrin nasal spray to cotton ball and pack nostrils to stop bleeding. Instructed to return to ED if uncontrollable bleeding occurs. Return in 1 week, call sooner if concerns arise.

## 2023-04-06 NOTE — Patient Instructions (Addendum)
988 Suicide and Crisis Lifeline  Try Debrox ear drops daily   Take Hydroxyzine as needed  Try a hobby to provide relaxation  Continue checking BP at home and bring readings in

## 2023-04-06 NOTE — Assessment & Plan Note (Signed)
PSA done.

## 2023-04-06 NOTE — Progress Notes (Unsigned)
BP 116/70   Pulse (!) 49   Temp 97.6 F (36.4 C) (Oral)   Ht 5' 7.32" (1.71 m)   Wt 166 lb 6.4 oz (75.5 kg)   SpO2 96%   BMI 25.81 kg/m    Subjective:    Patient ID: Ryan [name], male    DOB: Feb 08, 1966, 57 y.o.   MRN: [id number]  HPI: Ryan [name] is a 57 y.o. male presenting on 04/06/2023 for comprehensive medical examination. Current medical complaints include: Epistaxis.  Currently taking lifelong coumadin for mechanical AVR with goal 2.5-3.5.  He is currently taking coumadin Monday and Friday  5 MG, and 2.5 MG the other days. He is managed by the coumadin clinic, will return on 04/14/2023. Admits to doing better today, had some light pink nasal discharge after blowing his nose after Fridays visit, but denies any nose bleeds since then.   He currently lives with: Roomate Interim Problems from his last visit: no  HYPERTENSION / HYPERLIPIDEMIA Taking Lovastatin 20 MG daily and Lisinopril 2.5 MG daily, managed by Cardiology. Satisfied with current treatment? yes Duration of hypertension: chronic BP monitoring frequency: daily BP range: 118-130/70s BP medication side effects: no Duration of hyperlipidemia: chronic Cholesterol medication side effects: no Cholesterol supplements: none Medication compliance: excellent compliance Aspirin: Yes Recent stressors: yes Recurrent headaches: no Visual changes: yes needs glasses Palpitations: no Dyspnea: no Chest pain: no Lower extremity edema: no Dizzy/lightheaded: no   ANXIETY PHQ 9: 13, GAD 7: 10 At times he admits to having thoughts of hurting himself, denies homicidal thoughts.  He denies having a plan of how he is going to hurt himself, he has history of suicidal ideations and attempt in the past early 2000's. He is interested in counseling with [facility] in [place].  Mood status: worse Satisfied with current treatment?:  Not currently on treatment Symptom severity: moderate  Psychotherapy/counseling: no  Previous  psychiatric medications: None Depressed mood: no Anxious mood: yes worried about pills Anhedonia: yes Significant weight loss or gain: no Insomnia: no  Fatigue: no Feelings of worthlessness or guilt: yes Impaired concentration/indecisiveness: no Suicidal ideations: yes Hopelessness: yes Crying spells: yes  Depression Screen done today and results listed below:     04/06/2023   10:58 AM 04/02/2023    5:03 PM 03/09/2023    2:01 PM 08/07/2022   11:10 AM 12/31/2021    1:16 PM  Depression screen PHQ 2/9  Decreased Interest 1 0 1 2 0  Down, Depressed, Hopeless 2 1 2 2 1   PHQ - 2 Score 3 1 3 4 1   Altered sleeping 2 1 2  0 2  Tired, decreased energy 1 2 3 2  0  Change in appetite 1 0 1 1 1   Feeling bad or failure about yourself  2 3 2 1 2   Trouble concentrating 1 1 1 1  0  Moving slowly or fidgety/restless 1 1 0 1 0  Suicidal thoughts 2 1 1  0 0  PHQ-9 Score 13 10 13 10 6   Difficult doing work/chores Somewhat difficult Somewhat difficult Somewhat difficult Not difficult at all Somewhat difficult      04/06/2023   10:59 AM 04/02/2023    5:03 PM 08/07/2022   11:10 AM 12/31/2021    1:16 PM  GAD 7 : Generalized Anxiety Score  Nervous, Anxious, on Edge 1 1 1 1   Control/stop worrying 1 3 1 1   Worry too much - different things 1 2 0 2  Trouble relaxing 1 2 0 1  Restless  2 2 0 1  Easily annoyed or irritable 2 1 0 1  Afraid - awful might happen 2 1 0 1  Total GAD 7 Score 10 12 2 8   Anxiety Difficulty Somewhat difficult Somewhat difficult Not difficult at all Not difficult at all    The patient does not have a history of falls. I did not complete a risk assessment for falls. A plan of care for falls was not documented.   Past Medical History:  Past Medical History:  Diagnosis Date   Acute cholecystitis 12/25/2021   Acute cystitis with hematuria 11/20/2020   Last Assessment & Plan:   Formatting of this note might be different from the original.  Patient completed a course of Keflex.  Symptoms resolved. Denies dysuria, frequency, urgency, fevers, chills, abdominal pain or cramps.   Anemia    Aortic aneurysm and dissection (HCC)    a. 2001 s/p grafting and AVR; b. 09/2021 CTA Chest: Dil Ao root up to 51mm. Ao arch 46mm. Desc Ao 54mm. Complicated arch dissection w/ mult false and/or partially thrombosed lumens. Dissection flaps extend into all great vessels except L carotid. Dissection cont into R CCA and into abd->L RA.   Chronic anticoagulation    Chronic cholecystitis 01/31/2022   Dissecting aortic aneurysm, thoracoabdominal (HCC)    Fatty liver    GERD (gastroesophageal reflux disease)    H/O mechanical aortic valve replacement    a. 2001 s/p AVR in setting of Asc Ao aneurysm repair; b. 08/2021 Echo: EF 50-55%, no rwma, mild LVH, GrII DD, nl RV fxn, mild MR, triv AI w/ mean AoV grad .   Heart murmur    History of cardiac catheterization    a. 08/2019 MV: basal-dist lateral and inflat defect; b. 07/2020 Cath: nl cors.   Hyperlipidemia    Hypertension    Hypolipidemia    Iron deficiency    PAF (paroxysmal atrial fibrillation) (HCC)    a. CHA2DS2VASc = 3.  Chronic warfarin (also mech AVR).   Pulmonary nodule    Sleep apnea    Stroke [facility])     Surgical History:  Past Surgical History:  Procedure Laterality Date   ABDOMINAL SURGERY     AORTIC VALVE REPLACEMENT  2001   [employer] Mechanical   ARTHROSCOPIC REPAIR ACL     CHOLECYSTECTOMY  02/01/2022   COLONOSCOPY  2019   IR PERC CHOLECYSTOSTOMY  12/26/2021   MENISCUS REPAIR      Medications:  Current Outpatient Medications on File Prior to Visit  Medication Sig   acetaminophen (TYLENOL) 500 MG tablet Take 500 mg by mouth every 6 (six) hours as needed for headache, fever, moderate pain or mild pain.   amoxicillin-clavulanate (AUGMENTIN) 875-125 MG tablet Take 1 tablet by mouth 2 (two) times daily for 7 days. (Patient not taking: Reported on 04/02/2023)   aspirin 81 MG EC tablet Take 81 mg by mouth daily.    Ferrous Sulfate Dried (HIGH POTENCY IRON) 65 MG TABS Take 2 tablets by mouth daily.   lisinopril (ZESTRIL) 2.5 MG tablet Take 1 tablet (2.5 mg total) by mouth daily.   lovastatin (MEVACOR) 20 MG tablet TAKE 1 TABLET BY MOUTH AT BEDTIME   oxymetazoline (AFRIN) 0.05 % nasal spray Place 1 spray into both nostrils 2 (two) times daily.   Potassium 99 MG TABS Take 2 tablets by mouth every evening.   sodium chloride (OCEAN) 0.65 % SOLN nasal spray Place 1 spray into both nostrils as needed for up to 14 days  for congestion.   warfarin (COUMADIN) 5 MG tablet Take 1/2 to one tablet by mouth once daily as instructed by anticoagulation clinic. (Patient taking differently: Take 2.5-5 mg by mouth daily at 4 PM. Take 1/2 to one tablet by mouth once daily as instructed by anticoagulation clinic.)   No current facility-administered medications on file prior to visit.    Allergies:  Allergies  Allergen Reactions   Alitraq Rash   Iodinated Contrast Media Rash   Iodine Rash    CT dye CT dye  CT dye CT dye    Social History:  Social History   Socioeconomic History   Marital status: Single    Spouse name: Not on file   Number of children: Not on file   Years of education: Not on file   Highest education level: GED or equivalent  Occupational History   Not on file  Tobacco Use   Smoking status: Never    Passive exposure: Past   Smokeless tobacco: Never  Vaping Use   Vaping status: Never Used  Substance and Sexual Activity   Alcohol use: Yes    Comment: 2 mixed drinks per year   Drug use: Never   Sexual activity: Not Currently  Other Topics Concern   Not on file  Social History Narrative   Not on file   Social Determinants of Health   Financial Resource Strain: Medium Risk (04/05/2023)   Overall Financial Resource Strain (CARDIA)    Difficulty of Paying Living Expenses: Somewhat hard  Food Insecurity: Food Insecurity Present (04/05/2023)   Hunger Vital Sign    Worried About Running  Out of Food in the Last Year: Sometimes true    Ran Out of Food in the Last Year: Sometimes true  Transportation Needs: No Transportation Needs (04/05/2023)   PRAPARE - Administrator, Civil Service (Medical): No    Lack of Transportation (Non-Medical): No  Physical Activity: Unknown (04/05/2023)   Exercise Vital Sign    Days of Exercise per Week: 0 days    Minutes of Exercise per Session: Not on file  Stress: Stress Concern Present (04/05/2023)   [employer] of Occupational Health - Occupational Stress Questionnaire    Feeling of Stress : Rather much  Social Connections: Unknown (04/05/2023)   Social Connection and Isolation Panel [NHANES]    Frequency of Communication with Friends and Family: Three times a week    Frequency of Social Gatherings with Friends and Family: Patient declined    Attends Religious Services: More than 4 times per year    Active Member of [employer] or Organizations: No    Attends Engineer, structural: Not on file    Marital Status: Patient declined  Intimate Partner Violence: Not At Risk (10/12/2022)   Humiliation, Afraid, Rape, and Kick questionnaire    Fear of Current or Ex-Partner: No    Emotionally Abused: No    Physically Abused: No    Sexually Abused: No   Social History   Tobacco Use  Smoking Status Never   Passive exposure: Past  Smokeless Tobacco Never   Social History   Substance and Sexual Activity  Alcohol Use Yes   Comment: 2 mixed drinks per year    Family History:  Family History  Problem Relation Age of Onset   Heart attack Mother    Hypertension Mother    Hyperlipidemia Mother    Heart disease Mother    Heart attack Maternal Grandfather     Past medical history,  surgical history, medications, allergies, family history and social history reviewed with patient today and changes made to appropriate areas of the chart.   Review of Systems  Constitutional: Negative.   HENT: Negative.  Negative for  nosebleeds.   Eyes: Negative.   Respiratory:  Negative for shortness of breath.   Cardiovascular:  Negative for chest pain, palpitations and leg swelling.  Gastrointestinal: Negative.   Genitourinary: Negative.   Musculoskeletal:  Negative for myalgias.  Skin: Negative.   Neurological: Negative.   Endo/Heme/Allergies:  Negative for environmental allergies and polydipsia. Bruises/bleeds easily.  Psychiatric/Behavioral:  Positive for depression and suicidal ideas. Negative for hallucinations and substance abuse. The patient is nervous/anxious. The patient does not have insomnia.    All other ROS negative except what is listed above and in the HPI.      Objective:    BP 116/70   Pulse (!) 49   Temp 97.6 F (36.4 C) (Oral)   Ht 5' 7.32" (1.71 m)   Wt 166 lb 6.4 oz (75.5 kg)   SpO2 96%   BMI 25.81 kg/m   Wt Readings from Last 3 Encounters:  04/06/23 166 lb 6.4 oz (75.5 kg)  04/02/23 166 lb 9.6 oz (75.6 kg)  04/02/23 163 lb (73.9 kg)    Physical Exam Vitals and nursing note reviewed.  Constitutional:      General: He is not in acute distress.    Appearance: Normal appearance. He is not ill-appearing, toxic-appearing or diaphoretic.  HENT:     Head: Normocephalic and atraumatic.     Right Ear: Hearing and external ear normal. There is impacted cerumen. Tympanic membrane is not erythematous.     Left Ear: Hearing and external ear normal. There is impacted cerumen. Tympanic membrane is not erythematous.     Nose: Nose normal.     Mouth/Throat:     Mouth: Mucous membranes are moist.     Pharynx: Oropharynx is clear.     Tonsils: No tonsillar exudate or tonsillar abscesses.  Eyes:     General: Lids are normal. No scleral icterus.       Right eye: No discharge.        Left eye: No discharge.     Extraocular Movements: Extraocular movements intact.     Conjunctiva/sclera: Conjunctivae normal.     Pupils: Pupils are equal, round, and reactive to light.  Neck:     Thyroid: No  thyroid mass, thyromegaly or thyroid tenderness.     Trachea: Trachea normal. No tracheal deviation.  Cardiovascular:     Rate and Rhythm: Regular rhythm. Bradycardia present.     Pulses: Normal pulses.          Radial pulses are 2+ on the right side and 2+ on the left side.       Posterior tibial pulses are 2+ on the right side and 2+ on the left side.     Heart sounds: Normal heart sounds, S1 normal and S2 normal. No murmur heard.    No friction rub. No gallop.  Pulmonary:     Effort: Pulmonary effort is normal. No respiratory distress.     Breath sounds: Normal breath sounds. No stridor. No wheezing, rhonchi or rales.  Chest:     Chest wall: No tenderness.  Abdominal:     General: Bowel sounds are normal.     Palpations: Abdomen is soft.     Tenderness: There is no abdominal tenderness. There is no right CVA tenderness, left CVA tenderness or  guarding.     Hernia: A hernia is present.  Musculoskeletal:        General: Normal range of motion.     Cervical back: Full passive range of motion without pain, normal range of motion and neck supple.     Right lower leg: No edema.     Left lower leg: No edema.  Lymphadenopathy:     Cervical: No cervical adenopathy.  Skin:    General: Skin is warm and dry.     Capillary Refill: Capillary refill takes less than 2 seconds.     Coloration: Skin is not jaundiced or pale.     Findings: No bruising, erythema, lesion or rash.  Neurological:     General: No focal deficit present.     Mental Status: He is alert and oriented to person, place, and time. Mental status is at baseline.     Cranial Nerves: Cranial nerves 2-12 are intact. No cranial nerve deficit.     Sensory: No sensory deficit.     Motor: No weakness or tremor.     Coordination: Coordination is intact.     Gait: Gait is intact.     Deep Tendon Reflexes: Reflexes are normal and symmetric.  Psychiatric:        Attention and Perception: Attention and perception normal. He does not  perceive auditory or visual hallucinations.        Mood and Affect: Mood and affect normal.        Speech: Speech normal.        Behavior: Behavior normal. Behavior is cooperative.        Thought Content: Thought content normal.        Cognition and Memory: Cognition and memory normal.        Judgment: Judgment normal.     Results for orders placed or performed in visit on 04/02/23  CBC w/Diff  Result Value Ref Range   WBC 5.1 3.4 - 10.8 x10E3/uL   RBC 4.35 4.14 - 5.80 x10E6/uL   Hemoglobin 13.8 13.0 - 17.7 g/dL   Hematocrit 69.6 29.5 - 51.0 %   MCV 94 79 - 97 fL   MCH 31.7 26.6 - 33.0 pg   MCHC 33.9 31.5 - 35.7 g/dL   RDW 28.4 13.2 - 44.0 %   Platelets 148 (L) 150 - 450 x10E3/uL   Neutrophils 69 Not Estab. %   Lymphs 20 Not Estab. %   Monocytes 6 Not Estab. %   Eos 5 Not Estab. %   Basos 0 Not Estab. %   Neutrophils Absolute 3.5 1.4 - 7.0 x10E3/uL   Lymphocytes Absolute 1.0 0.7 - 3.1 x10E3/uL   Monocytes Absolute 0.3 0.1 - 0.9 x10E3/uL   EOS (ABSOLUTE) 0.3 0.0 - 0.4 x10E3/uL   Basophils Absolute 0.0 0.0 - 0.2 x10E3/uL   Immature Granulocytes 0 Not Estab. %   Immature Grans (Abs) 0.0 0.0 - 0.1 x10E3/uL  INR/PT  Result Value Ref Range   INR 2.2 (H) 0.9 - 1.2   Prothrombin Time 23.0 (H) 9.1 - 12.0 sec  Comp Met (CMET)  Result Value Ref Range   Glucose 125 (H) 70 - 99 mg/dL   BUN 9 6 - 24 mg/dL   Creatinine, Ser 1.02 0.76 - 1.27 mg/dL   eGFR 81 >72 ZD/GUY/4.03   BUN/Creatinine Ratio 8 (L) 9 - 20   Sodium 141 134 - 144 mmol/L   Potassium 3.7 3.5 - 5.2 mmol/L   Chloride 107 (H) 96 - 106  mmol/L   CO2 20 20 - 29 mmol/L   Calcium 8.3 (L) 8.7 - 10.2 mg/dL   Total Protein 6.1 6.0 - 8.5 g/dL   Albumin 4.0 3.8 - 4.9 g/dL   Globulin, Total 2.1 1.5 - 4.5 g/dL   Bilirubin Total 0.8 0.0 - 1.2 mg/dL   Alkaline Phosphatase 64 44 - 121 IU/L   AST 19 0 - 40 IU/L   ALT 12 0 - 44 IU/L      Assessment & Plan:   Problem List Items Addressed This Visit     Anticoagulated on  Coumadin    Chronic, stable. INR/PT done today, last check 3 days ago at  2.3. Nosebleeds have resolved. Taking 5 MG Coumadin on Mondays and Fridays and 2.5 MG other days of the week. Managed by cardiology, will see them 04/14/2023.       Essential hypertension    Chronic, controlled. Managed by Cardiology, continue taking Lisinopril 20 MG daily. Recommend to start writing BP readings in log and bring to your next visit.       Epistaxis - Primary   Relevant Orders   INR/PT   BPH without obstruction/lower urinary tract symptoms    PSA done      Relevant Orders   PSA   Depression, major, single episode, moderate (HCC)    Acute, ongoing. PHQ 9:13 , moderate depression, along with SI thoughts. Referral placed for psychologist at [facility] in [place], Hydroxyzine 25 MG given BID PRN, provided Suicide crises line number, educated to continue hobbies and activities that provide relaxation. Return in 1 month, call sooner if concerns arise.       Relevant Medications   hydrOXYzine (VISTARIL) 25 MG capsule   Other Relevant Orders   PSA   TSH + free T4   Ambulatory referral to Psychology   Anxiety    Acute, ongoing. 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 Referral placed for psychologist at [facility] in [place], Hydroxyzine 25 MG given BID PRN, provided Suicide crises line number, educated to continue hobbies and activities that provide relaxation. Return in 1 month, call sooner if concerns arise.       Relevant Medications   hydrOXYzine (VISTARIL) 25 MG capsule   Other Relevant Orders   TSH + free T4   Ambulatory referral to Psychology   Other Visit Diagnoses     Hypercholesteremia       Chronic, controlled. Lipid panel today. Contiue Lovastatin 20 MG, managed by Cardiology.   Relevant Orders   Lipid Panel w/o Chol/HDL Ratio   Encounter for annual physical exam       Impacted cerumen of both ears       Acute, ongoing. Ear irrigation done today with wax remaining.  Recommend debrox with warm wash cloth daily for ear cleaning. Will recheck ears at next visit.   Hyperglycemia       Acute, stable. A1C done today.   Relevant Orders   Urinalysis, Routine w reflex microscopic   HgB A1c   Encounter for hepatitis C screening test for low risk patient       Relevant Orders   Hepatitis C Antibody        Discussed aspirin prophylaxis for myocardial infarction prevention and decision was made to continue ASA  LABORATORY TESTING:  Health maintenance labs ordered today as discussed above.   The natural history of prostate cancer and ongoing controversy regarding screening and potential treatment outcomes of prostate cancer has been discussed with the  patient. The meaning of a false positive PSA and a false negative PSA has been discussed. He indicates understanding of the limitations of this screening test and wishes *** to proceed with screening PSA testing.   IMMUNIZATIONS:   - Tdap: Tetanus vaccination status reviewed: last tetanus booster within 10 years. - Influenza: Refused - Pneumovax: Refused - Prevnar: Refused - COVID: Refused - HPV: Not applicable - Shingrix vaccine: Refused  SCREENING: - Colonoscopy: Up to date  - AAA Screening: Not applicable   PATIENT COUNSELING:    Sexuality: Discussed sexually transmitted diseases, partner selection, use of condoms, avoidance of unintended pregnancy  and contraceptive alternatives.   Advised to avoid cigarette smoking.  I discussed with the patient that most people either abstain from alcohol or drink within safe limits (<=14/week and <=4 drinks/occasion for males, <=7/weeks and <= 3 drinks/occasion for females) and that the risk for alcohol disorders and other health effects rises proportionally with the number of drinks per week and how often a drinker exceeds daily limits.  Discussed cessation/primary prevention of drug use and availability of treatment for abuse.   Diet: Encouraged to adjust  caloric intake to maintain  or achieve ideal body weight, to reduce intake of dietary saturated fat and total fat, to limit sodium intake by avoiding high sodium foods and not adding table salt, and to maintain adequate dietary potassium and calcium preferably from fresh fruits, vegetables, and low-fat dairy products.    stressed the importance of regular exercise  Injury prevention: Discussed safety belts, safety helmets, smoke detector, smoking near bedding or upholstery.   Dental health: Discussed importance of regular tooth brushing, flossing, and dental visits.   Follow up plan: NEXT PREVENTATIVE PHYSICAL DUE IN 1 YEAR. Return in about 1 month (around 05/07/2023) for Follow up mood .

## 2023-04-07 LAB — LIPID PANEL W/O CHOL/HDL RATIO
Cholesterol, Total: 86 mg/dL — ABNORMAL LOW (ref 100–199)
HDL: 30 mg/dL — ABNORMAL LOW (ref >39)
LDL Chol Calc (NIH): 36 mg/dL (ref 0–99)
Triglycerides: 106 mg/dL (ref 0–149)
VLDL Cholesterol Cal: 20 mg/dL (ref 5–40)

## 2023-04-07 LAB — HEPATITIS C ANTIBODY: Hep C Virus Ab: NONREACTIVE

## 2023-04-07 LAB — PROTIME-INR
INR: 2.1 — ABNORMAL HIGH (ref 0.9–1.2)
Prothrombin Time: 22.4 s — ABNORMAL HIGH (ref 9.1–12.0)

## 2023-04-07 LAB — HEMOGLOBIN A1C
Est. average glucose Bld gHb Est-mCnc: 105 mg/dL
Hgb A1c MFr Bld: 5.3 % (ref 4.8–5.6)

## 2023-04-07 LAB — PSA: Prostate Specific Ag, Serum: 1.2 ng/mL (ref 0.0–4.0)

## 2023-04-09 NOTE — Progress Notes (Signed)
Hi Ryan [name], your lipid panel came back stable. Your good cholesterol HDL level is a little low we would like this to be greater than 39 mg/dl and bad cholesterol LDL is within normal range. INR and PT levels remain low, continue taking coumadin as prescribed and maintain appointment with cardiology. Your urine returned normal, no signs of dehydration or infection. PSA levels are normal at 1.2 ng/ml. Hemoglobin A1C levels are normal at 5.3% indicating no diabetes or prediabetes. Hepatitis C labs are negative. Still waiting on thyroid levels. Thank you for allowing me to participate in your care.

## 2023-04-10 ENCOUNTER — Other Ambulatory Visit: Payer: Self-pay

## 2023-04-10 ENCOUNTER — Emergency Department: Payer: PRIVATE HEALTH INSURANCE

## 2023-04-10 ENCOUNTER — Emergency Department
Admission: EM | Admit: 2023-04-10 | Discharge: 2023-04-10 | Disposition: A | Discharge status: Home / Self Care | Payer: PRIVATE HEALTH INSURANCE | Attending: Emergency Medicine | Admitting: Emergency Medicine

## 2023-04-10 ENCOUNTER — Encounter: Payer: Self-pay | Admitting: Emergency Medicine

## 2023-04-10 DIAGNOSIS — R42 Dizziness and giddiness: Secondary | ICD-10-CM | POA: Insufficient documentation

## 2023-04-10 DIAGNOSIS — R531 Weakness: Secondary | ICD-10-CM | POA: Insufficient documentation

## 2023-04-10 DIAGNOSIS — G459 Transient cerebral ischemic attack, unspecified: Secondary | ICD-10-CM | POA: Diagnosis not present

## 2023-04-10 LAB — TROPONIN I (HIGH SENSITIVITY)
Troponin I (High Sensitivity): 22 ng/L — ABNORMAL HIGH (ref <18)
Troponin I (High Sensitivity): 24 ng/L — ABNORMAL HIGH (ref <18)

## 2023-04-10 LAB — URINALYSIS, ROUTINE W REFLEX MICROSCOPIC
Bilirubin Urine: NEGATIVE
Glucose, UA: NEGATIVE mg/dL
Ketones, ur: NEGATIVE mg/dL
Leukocytes,Ua: NEGATIVE
Nitrite: NEGATIVE
Protein, ur: NEGATIVE mg/dL
Specific Gravity, Urine: 1.009 (ref 1.005–1.030)
pH: 7 (ref 5.0–8.0)

## 2023-04-10 LAB — CBC
HCT: 37.6 % — ABNORMAL LOW (ref 39.0–52.0)
Hemoglobin: 12.8 g/dL — ABNORMAL LOW (ref 13.0–17.0)
MCH: 31.6 pg (ref 26.0–34.0)
MCHC: 34 g/dL (ref 30.0–36.0)
MCV: 92.8 fL (ref 80.0–100.0)
Platelets: 173 10*3/uL (ref 150–400)
RBC: 4.05 MIL/uL — ABNORMAL LOW (ref 4.22–5.81)
RDW: 14 % (ref 11.5–15.5)
WBC: 5.4 10*3/uL (ref 4.0–10.5)
nRBC: 0 % (ref 0.0–0.2)

## 2023-04-10 LAB — BASIC METABOLIC PANEL
Anion gap: 3 — ABNORMAL LOW (ref 5–15)
BUN: 13 mg/dL (ref 6–20)
CO2: 28 mmol/L (ref 22–32)
Calcium: 8.3 mg/dL — ABNORMAL LOW (ref 8.9–10.3)
Chloride: 105 mmol/L (ref 98–111)
Creatinine, Ser: 1.02 mg/dL (ref 0.61–1.24)
GFR, Estimated: >60 mL/min (ref >60)
Glucose, Bld: 105 mg/dL — ABNORMAL HIGH (ref 70–99)
Potassium: 3.5 mmol/L (ref 3.5–5.1)
Sodium: 136 mmol/L (ref 135–145)

## 2023-04-10 LAB — PROTIME-INR
INR: 2.3 — ABNORMAL HIGH (ref 0.8–1.2)
Prothrombin Time: 25.2 s — ABNORMAL HIGH (ref 11.4–15.2)

## 2023-04-10 MED ORDER — MECLIZINE HCL 50 MG PO TABS: 50.0000 mg | ORAL_TABLET | Freq: Three times a day (TID) | ORAL | 0 refills | Status: DC | PRN

## 2023-04-10 MED ORDER — FLUTICASONE PROPIONATE 50 MCG/ACT NA SUSP
1.0000 | Freq: Two times a day (BID) | NASAL | 0 refills | Status: AC
Start: 2023-04-10 — End: ?

## 2023-04-10 NOTE — Discharge Instructions (Addendum)
As we discussed please follow-up with your doctor in the next 2 to 3 days for recheck/reevaluation.  Return to the emergency department for any weakness or numbness of any arm or leg confusion or difficulty speaking.
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
## 2023-04-10 NOTE — ED Notes (Signed)
Provided urinal for UA
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
## 2023-04-10 NOTE — ED Notes (Signed)
Pt reports chest pain and headache.
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
## 2023-04-14 ENCOUNTER — Ambulatory Visit: Payer: PRIVATE HEALTH INSURANCE | Attending: Internal Medicine

## 2023-04-14 DIAGNOSIS — Z5181 Encounter for therapeutic drug level monitoring: Secondary | ICD-10-CM | POA: Diagnosis not present

## 2023-04-14 DIAGNOSIS — Z952 Presence of prosthetic heart valve: Secondary | ICD-10-CM | POA: Diagnosis not present

## 2023-04-14 DIAGNOSIS — Z7901 Long term (current) use of anticoagulants: Secondary | ICD-10-CM

## 2023-04-14 LAB — POCT INR: INR: 2.5 (ref 2.0–3.0)

## 2023-04-14 NOTE — Patient Instructions (Signed)
Continue 1/2 tablet every day EXCEPT 1 tablet on Mondays and Fridays.   EAT GREENS 2 DAY PER WEEK;. [phone];  Recheck INR in 6 weeks

## 2023-04-19 ENCOUNTER — Other Ambulatory Visit: Payer: Self-pay | Admitting: Internal Medicine

## 2023-04-24 ENCOUNTER — Other Ambulatory Visit: Payer: Self-pay | Admitting: Nurse Practitioner

## 2023-04-26 NOTE — Telephone Encounter (Signed)
Requested Prescriptions  Pending Prescriptions Disp Refills   lovastatin (MEVACOR) 20 MG tablet [Pharmacy Med Name: Lovastatin 20 MG Oral Tablet] 90 tablet 3    Sig: TAKE 1 TABLET BY MOUTH AT BEDTIME     Cardiovascular:  Antilipid - Statins 2 Failed - 04/24/2023 10:28 AM      Failed - Lipid Panel in normal range within the last 12 months    Cholesterol, Total  Date Value Ref Range Status  04/06/2023 86 (L) 100 - 199 mg/dL Final   LDL Chol Calc (NIH)  Date Value Ref Range Status  04/06/2023 36 0 - 99 mg/dL Final   HDL  Date Value Ref Range Status  04/06/2023 30 (L) >39 mg/dL Final   Triglycerides  Date Value Ref Range Status  04/06/2023 106 0 - 149 mg/dL Final         Passed - Cr in normal range and within 360 days    Creatinine, Ser  Date Value Ref Range Status  04/10/2023 1.02 0.61 - 1.24 mg/dL Final         Passed - Patient is not pregnant      Passed - Valid encounter within last 12 months    Recent Outpatient Visits           2 weeks ago Epistaxis   [facility] Pear[place], Sherran Needs, NP   3 weeks ago Epistaxis   [place] [place], Sherran Needs, NP   1 month ago Herpes zoster without complication   [facility] [place], Dorie Rank, NP   8 months ago Encounter for removal of staples   [place] [facility] Mecum, Oswaldo Conroy, PA-C   8 months ago Laceration of scalp without foreign body, subsequent encounter   [place] [facility] Mecum, Oswaldo Conroy, PA-C       Future Appointments             In 2 weeks Larae Grooms, NP [facility]

## 2023-05-11 ENCOUNTER — Ambulatory Visit (INDEPENDENT_AMBULATORY_CARE_PROVIDER_SITE_OTHER): Payer: PRIVATE HEALTH INSURANCE | Admitting: Nurse Practitioner

## 2023-05-11 ENCOUNTER — Encounter: Payer: Self-pay | Admitting: Nurse Practitioner

## 2023-05-11 VITALS — BP 136/75 | HR 57 | Temp 97.8°F | Ht 67.0 in | Wt 167.6 lb

## 2023-05-11 DIAGNOSIS — F321 Major depressive disorder, single episode, moderate: Secondary | ICD-10-CM | POA: Diagnosis not present

## 2023-05-11 NOTE — Progress Notes (Signed)
BP 136/75 (BP Location: Left Arm, Patient Position: Sitting, Cuff Size: Normal)   Pulse (!) 57   Temp 97.8 F (36.6 C) (Oral)   Ht 5\' 7"  (1.702 m)   Wt 167 lb 9.6 oz (76 kg)   SpO2 97%   BMI 26.25 kg/m    Subjective:    Patient ID: Ryan [name], male    DOB: 13-Jan-1966, 57 y.o.   MRN: [id number]  HPI: Ryan [name] is a 57 y.o. male  Chief Complaint  Patient presents with   1 month follow up   Mood   ANXIETY PHQ 9: 13, GAD 7: 10 At times he admits his mood has been up and down but it has been mostly up.  Did have one incident where he wasn't feeling great and didn't go to work.  He took some time away in the park and then felt better.  He does not have any thoughts of harming himself. He is still interested in counseling but hasn't seen anyone.  He has used the hydroxyzine a couple of times and feels like it is helping.   Mood status: improved Satisfied with current treatment?:  Not currently on treatment Symptom severity: moderate  Psychotherapy/counseling: no  Previous psychiatric medications: None Depressed mood: no Anxious mood: yes worried about pills Anhedonia: yes Significant weight loss or gain: no Insomnia: no  Fatigue: no Feelings of worthlessness or guilt: yes Impaired concentration/indecisiveness: no Suicidal ideations: yes Hopelessness: yes Crying spells: yes   Relevant past medical, surgical, family and social history reviewed and updated as indicated. Interim medical history since our last visit reviewed. Allergies and medications reviewed and updated.  Review of Systems  Psychiatric/Behavioral:  Positive for dysphoric mood. Negative for suicidal ideas. The patient is nervous/anxious.     Per HPI unless specifically indicated above     Objective:    BP 136/75 (BP Location: Left Arm, Patient Position: Sitting, Cuff Size: Normal)   Pulse (!) 57   Temp 97.8 F (36.6 C) (Oral)   Ht 5\' 7"  (1.702 m)   Wt 167 lb 9.6 oz (76 kg)   SpO2 97%   BMI 26.25  kg/m   Wt Readings from Last 3 Encounters:  05/11/23 167 lb 9.6 oz (76 kg)  04/10/23 166 lb 9.6 oz (75.6 kg)  04/06/23 166 lb 6.4 oz (75.5 kg)    Physical Exam Vitals and nursing note reviewed.  Constitutional:      General: He is not in acute distress.    Appearance: Normal appearance. He is not ill-appearing, toxic-appearing or diaphoretic.  HENT:     Head: Normocephalic.     Right Ear: External ear normal.     Left Ear: External ear normal.     Nose: Nose normal. No congestion or rhinorrhea.     Mouth/Throat:     Mouth: Mucous membranes are moist.  Eyes:     General:        Right eye: No discharge.        Left eye: No discharge.     Extraocular Movements: Extraocular movements intact.     Conjunctiva/sclera: Conjunctivae normal.     Pupils: Pupils are equal, round, and reactive to light.  Cardiovascular:     Rate and Rhythm: Normal rate and regular rhythm.     Heart sounds: No murmur heard. Pulmonary:     Effort: Pulmonary effort is normal. No respiratory distress.     Breath sounds: Normal breath sounds. No wheezing, rhonchi or rales.  Abdominal:     General: Abdomen is flat. Bowel sounds are normal.  Musculoskeletal:     Cervical back: Normal range of motion and neck supple.  Skin:    General: Skin is warm and dry.     Capillary Refill: Capillary refill takes less than 2 seconds.  Neurological:     General: No focal deficit present.     Mental Status: He is alert and oriented to person, place, and time.  Psychiatric:        Mood and Affect: Mood normal.        Behavior: Behavior normal.        Thought Content: Thought content normal.        Judgment: Judgment normal.     Results for orders placed or performed in visit on 04/14/23  POCT INR  Result Value Ref Range   INR 2.5 2.0 - 3.0   POC INR        Assessment & Plan:   Problem List Items Addressed This Visit       Other   Depression, major, single episode, moderate (HCC) - Primary    Chronic.   Ongoing concern.  Discussed messages sent to patient via mychart for behavioral health.  Encouraged patient to respond to messages and get information completed to get an appointment. Continue to use PRN hydroxyzine.  Patient aware of crisis hotline.  Follow up in 3 months.  Okay to follow up sooner if symptoms worsen.        Follow up plan: Return in about 3 months (around 08/11/2023) for Depression/Anxiety FU.

## 2023-05-11 NOTE — Assessment & Plan Note (Signed)
Chronic.  Ongoing concern.  Discussed messages sent to patient via mychart for behavioral health.  Encouraged patient to respond to messages and get information completed to get an appointment. Continue to use PRN hydroxyzine.  Patient aware of crisis hotline.  Follow up in 3 months.  Okay to follow up sooner if symptoms worsen.

## 2023-05-26 ENCOUNTER — Ambulatory Visit: Payer: PRIVATE HEALTH INSURANCE | Attending: Internal Medicine

## 2023-05-26 DIAGNOSIS — Z7901 Long term (current) use of anticoagulants: Secondary | ICD-10-CM

## 2023-05-26 DIAGNOSIS — Z952 Presence of prosthetic heart valve: Secondary | ICD-10-CM | POA: Diagnosis not present

## 2023-05-26 DIAGNOSIS — Z5181 Encounter for therapeutic drug level monitoring: Secondary | ICD-10-CM | POA: Diagnosis not present

## 2023-05-26 LAB — POCT INR: INR: 2.1 (ref 2.0–3.0)

## 2023-05-26 NOTE — Patient Instructions (Signed)
Take 1 tablet today only then Continue 1/2 tablet every day EXCEPT 1 tablet on Mondays and Fridays.   EAT GREENS 2 DAYS PER WEEK;. [phone];  Recheck INR in 6 weeks

## 2023-07-01 ENCOUNTER — Telehealth: Payer: PRIVATE HEALTH INSURANCE | Admitting: Family Medicine

## 2023-07-01 DIAGNOSIS — R093 Abnormal sputum: Secondary | ICD-10-CM

## 2023-07-01 MED ORDER — AMOXICILLIN-POT CLAVULANATE 875-125 MG PO TABS
1.0000 | ORAL_TABLET | Freq: Two times a day (BID) | ORAL | 0 refills | Status: DC
Start: 2023-07-01 — End: 2023-07-01

## 2023-07-01 NOTE — Progress Notes (Signed)
  Because of you extensive heart history, and symptoms that might be pneumonia related,  your condition warrants further evaluation and I recommend that you be seen in a face-to-face visit at your PCP office or local urgent care.   NOTE: There will be NO CHARGE for this E-Visit   If you are having a true medical emergency, please call 911.

## 2023-07-07 ENCOUNTER — Ambulatory Visit: Payer: No Typology Code available for payment source | Attending: Internal Medicine

## 2023-07-07 DIAGNOSIS — Z5181 Encounter for therapeutic drug level monitoring: Secondary | ICD-10-CM | POA: Diagnosis not present

## 2023-07-07 DIAGNOSIS — Z952 Presence of prosthetic heart valve: Secondary | ICD-10-CM

## 2023-07-07 DIAGNOSIS — Z7901 Long term (current) use of anticoagulants: Secondary | ICD-10-CM | POA: Diagnosis not present

## 2023-07-07 LAB — POCT INR: INR: 1.8 — AB (ref 2.0–3.0)

## 2023-07-07 NOTE — Patient Instructions (Signed)
Take 1 tablet today and Thursday only then Continue 1/2 tablet every day EXCEPT 1 tablet on Mondays and Fridays.   EAT GREENS 2 DAYS PER WEEK;. [phone];  Recheck INR in 4 weeks

## 2023-08-04 ENCOUNTER — Ambulatory Visit: Payer: No Typology Code available for payment source | Attending: Internal Medicine

## 2023-08-04 ENCOUNTER — Ambulatory Visit: Payer: No Typology Code available for payment source

## 2023-08-04 DIAGNOSIS — Z952 Presence of prosthetic heart valve: Secondary | ICD-10-CM | POA: Diagnosis not present

## 2023-08-04 DIAGNOSIS — Z5181 Encounter for therapeutic drug level monitoring: Secondary | ICD-10-CM | POA: Diagnosis not present

## 2023-08-04 DIAGNOSIS — Z7901 Long term (current) use of anticoagulants: Secondary | ICD-10-CM | POA: Diagnosis not present

## 2023-08-04 LAB — POCT INR: INR: 2.9 (ref 2.0–3.0)

## 2023-08-04 NOTE — Patient Instructions (Signed)
 Continue 1/2 tablet every day EXCEPT 1 tablet on Mondays and Fridays.   EAT GREENS 2 DAYS PER WEEK;. [phone];  Recheck INR in 6 weeks

## 2023-08-10 ENCOUNTER — Telehealth: Payer: Self-pay | Admitting: Internal Medicine

## 2023-08-10 NOTE — Telephone Encounter (Signed)
 STAT if HR is under 50 or over 120 (normal HR is 60-100 beats per minute)  What is your heart rate? Last night 108, skipped beats 5 times  Do you have a log of your heart rate readings (document readings)? no  Do you have any other symptoms? no

## 2023-08-10 NOTE — Telephone Encounter (Signed)
 Spoke with patient and he reports feeling the skipped beats and heart rates. Reviewed that he has atrial fibrillation and that he may feel those sensations. Discussed heart rates and when to get concerned. He verbalized understanding of our discussion with no further questions at this time. Encouraged him to call back if needed.

## 2023-08-11 ENCOUNTER — Encounter: Payer: Self-pay | Admitting: Nurse Practitioner

## 2023-08-11 ENCOUNTER — Ambulatory Visit (INDEPENDENT_AMBULATORY_CARE_PROVIDER_SITE_OTHER): Payer: No Typology Code available for payment source | Admitting: Nurse Practitioner

## 2023-08-11 VITALS — BP 105/45 | HR 60 | Ht 66.54 in | Wt 166.0 lb

## 2023-08-11 DIAGNOSIS — N182 Chronic kidney disease, stage 2 (mild): Secondary | ICD-10-CM | POA: Diagnosis not present

## 2023-08-11 DIAGNOSIS — R7301 Impaired fasting glucose: Secondary | ICD-10-CM

## 2023-08-11 DIAGNOSIS — I7103 Dissection of thoracoabdominal aorta: Secondary | ICD-10-CM

## 2023-08-11 DIAGNOSIS — I5032 Chronic diastolic (congestive) heart failure: Secondary | ICD-10-CM | POA: Diagnosis not present

## 2023-08-11 DIAGNOSIS — F419 Anxiety disorder, unspecified: Secondary | ICD-10-CM

## 2023-08-11 DIAGNOSIS — E782 Mixed hyperlipidemia: Secondary | ICD-10-CM

## 2023-08-11 DIAGNOSIS — I7789 Other specified disorders of arteries and arterioles: Secondary | ICD-10-CM

## 2023-08-11 DIAGNOSIS — N3001 Acute cystitis with hematuria: Secondary | ICD-10-CM

## 2023-08-11 DIAGNOSIS — I480 Paroxysmal atrial fibrillation: Secondary | ICD-10-CM

## 2023-08-11 DIAGNOSIS — F321 Major depressive disorder, single episode, moderate: Secondary | ICD-10-CM

## 2023-08-11 DIAGNOSIS — R300 Dysuria: Secondary | ICD-10-CM

## 2023-08-11 LAB — URINALYSIS, ROUTINE W REFLEX MICROSCOPIC
Bilirubin, UA: NEGATIVE
Glucose, UA: NEGATIVE
Ketones, UA: NEGATIVE
Nitrite, UA: POSITIVE — AB
Specific Gravity, UA: 1.025 (ref 1.005–1.030)
Urobilinogen, Ur: 1 mg/dL (ref 0.2–1.0)
pH, UA: 5.5 (ref 5.0–7.5)

## 2023-08-11 LAB — MICROSCOPIC EXAMINATION

## 2023-08-11 MED ORDER — CIPROFLOXACIN HCL 500 MG PO TABS: 500.0000 mg | ORAL_TABLET | Freq: Two times a day (BID) | ORAL | 0 refills | Status: AC

## 2023-08-11 NOTE — Assessment & Plan Note (Addendum)
Chronic. Followed by Cardiology, Dr. END. Continue with current medication regimen. Continue to follow up with Dr. End.  Return in 3 months.  Call sooner if concerns arise.  ?

## 2023-08-11 NOTE — Assessment & Plan Note (Signed)
 Chronic.  Controlled.  Continue with current medication regimen.  Labs ordered today.  Return to clinic in 6 months for reevaluation.  Call sooner if concerns arise.  ? ?

## 2023-08-11 NOTE — Assessment & Plan Note (Signed)
 Follow-up echo in April showed stable LVEF of 40-45%. Patient reports stable NYHA class II symptoms. Continue low-dose lisinopril.  Beta-blocker deferred by Cardiology at last visit.

## 2023-08-11 NOTE — Progress Notes (Signed)
 BP (!) 105/45 (BP Location: Right Arm, Patient Position: Sitting, Cuff Size: Normal)   Pulse 60   Ht 5' 6.54" (1.69 m)   Wt 166 lb (75.3 kg)   SpO2 97%   BMI 26.36 kg/m    Subjective:    Patient ID: Ryan [name], male    DOB: Oct 28, 1965, 58 y.o.   MRN: [id number]  HPI: Ryan [name] is a 58 y.o. male  Chief Complaint  Patient presents with   Follow-up    Depression/anxiety    ANXIETY PHQ 9: 0, GAD 7: 6 At times he admits his mood has been up and down but it has been mostly up.  He is worried about losing his about the possibility of losing his home.  He may also have to file for bankruptcy.  He has used the hydroxyzine a couple of times and feels like it is helping. He does not drink alcohol.  Feels like he is doing pretty well.       08/11/2023   10:55 AM 05/11/2023   10:58 AM 04/06/2023   10:59 AM 04/02/2023    5:03 PM  GAD 7 : Generalized Anxiety Score  Nervous, Anxious, on Edge 2 1 1 1   Control/stop worrying 2 1 1 3   Worry too much - different things 2 1 1 2   Trouble relaxing 0 1 1 2   Restless 0 1 2 2   Easily annoyed or irritable 0 2 2 1   Afraid - awful might happen 0 2 2 1   Total GAD 7 Score 6 9 10 12   Anxiety Difficulty Not difficult at all  Somewhat difficult Somewhat difficult      Flowsheet Row Office Visit from 08/11/2023 in [facility]  PHQ-9 Total Score 0       HYPERTENSION / HYPERLIPIDEMIA Taking Lovastatin 20 MG daily and Lisinopril 2.5 MG daily, managed by Cardiology. INR is done with Cardiology.  He did have an incident of HR of 108 bpm so he called Cardiology who is not concerned until he is 120 bpm. Satisfied with current treatment? yes Duration of hypertension: chronic BP monitoring frequency: daily BP range: 118-130/70s BP medication side effects: no Duration of hyperlipidemia: chronic Cholesterol medication side effects: no Cholesterol supplements: none Medication compliance: excellent compliance Aspirin:  Yes Recent stressors: yes Recurrent headaches: no Visual changes: yes needs glasses Palpitations: no Dyspnea: no Chest pain: no Lower extremity edema: no Dizzy/lightheaded: no   Patient states the last two days he has been having pain with urination.  No discharge from penis.  He is not sexually active and does not have any concern for STI.  Relevant past medical, surgical, family and social history reviewed and updated as indicated. Interim medical history since our last visit reviewed. Allergies and medications reviewed and updated.  Review of Systems  Eyes:  Negative for visual disturbance.  Respiratory:  Negative for shortness of breath.   Cardiovascular:  Positive for palpitations. Negative for chest pain and leg swelling.  Neurological:  Negative for light-headedness and headaches.  Psychiatric/Behavioral:  Positive for dysphoric mood. The patient is nervous/anxious.     Per HPI unless specifically indicated above     Objective:    BP (!) 105/45 (BP Location: Right Arm, Patient Position: Sitting, Cuff Size: Normal)   Pulse 60   Ht 5' 6.54" (1.69 m)   Wt 166 lb (75.3 kg)   SpO2 97%   BMI 26.36 kg/m   Wt Readings from Last 3 Encounters:  08/11/23 166 lb (75.3 kg)  05/11/23 167 lb 9.6 oz (76 kg)  04/10/23 166 lb 9.6 oz (75.6 kg)    Physical Exam Vitals and nursing note reviewed.  Constitutional:      General: He is not in acute distress.    Appearance: Normal appearance. He is not ill-appearing, toxic-appearing or diaphoretic.  HENT:     Head: Normocephalic.     Right Ear: External ear normal.     Left Ear: External ear normal.     Nose: Nose normal. No congestion or rhinorrhea.     Mouth/Throat:     Mouth: Mucous membranes are moist.  Eyes:     General:        Right eye: No discharge.        Left eye: No discharge.     Extraocular Movements: Extraocular movements intact.     Conjunctiva/sclera: Conjunctivae normal.     Pupils: Pupils are equal, round, and  reactive to light.  Cardiovascular:     Rate and Rhythm: Normal rate and regular rhythm.     Heart sounds: No murmur heard. Pulmonary:     Effort: Pulmonary effort is normal. No respiratory distress.     Breath sounds: Normal breath sounds. No wheezing, rhonchi or rales.  Abdominal:     General: Abdomen is flat. Bowel sounds are normal.  Musculoskeletal:     Cervical back: Normal range of motion and neck supple.  Skin:    General: Skin is warm and dry.     Capillary Refill: Capillary refill takes less than 2 seconds.  Neurological:     General: No focal deficit present.     Mental Status: He is alert and oriented to person, place, and time.  Psychiatric:        Mood and Affect: Mood normal.        Behavior: Behavior normal.        Thought Content: Thought content normal.        Judgment: Judgment normal.     Results for orders placed or performed in visit on 08/04/23  POCT INR   Collection Time: 08/04/23 10:13 AM  Result Value Ref Range   INR 2.9 2.0 - 3.0   POC INR        Assessment & Plan:   Problem List Items Addressed This Visit       Cardiovascular and Mediastinum   Chronic heart failure with preserved ejection fraction (HFpEF) (HCC) (Chronic)   Follow-up echo in April showed stable LVEF of 40-45%. Patient reports stable NYHA class II symptoms. Continue low-dose lisinopril.  Beta-blocker deferred by Cardiology at last visit.      Dissecting aortic aneurysm, thoracoabdominal (HCC)   No symptoms reported.  CTA and echocardiogram in the spring showed stable findings.  Continue indefinite anticoagulation with warfarin.  INR followed by Cardiology.  Continue annual follow-up with Dr. Laneta Simmers.       Relevant Orders   Comp Met (CMET)   Paroxysmal atrial fibrillation (HCC)   Chronic. Followed by Cardiology, Dr. END. Continue with current medication regimen. Continue to follow up with Dr. Okey Dupre.  Return in 3 months.  Call sooner if concerns arise.         Endocrine    Impaired fasting glucose   Labs ordered at visit today.  Will make recommendations based on lab results.        Relevant Orders   HgB A1c     Genitourinary   Chronic kidney disease (CKD), stage  II (mild)   Relevant Orders   Comp Met (CMET)     Other   Enlarged coronary artery (HCC) - Primary   Chronic.  Controlled.  Continue with current medication regimen.  Labs ordered today.  Return to clinic in 6 months for reevaluation.  Call sooner if concerns arise.        Relevant Orders   Comp Met (CMET)   Mixed hyperlipidemia   Chronic.  Controlled.  Continue with current medication regimen.  Labs ordered today.  Return to clinic in 6 months for reevaluation.  Call sooner if concerns arise.        Relevant Orders   Lipid Profile   Depression, major, single episode, moderate (HCC)   Chronic.  Controlled with PRN hydroxyzine.  Continue with current medication regimen.  Follow up in 6 months.  Call sooner if concerns arise.       Anxiety   Other Visit Diagnoses       Dysuria       UA ordered during visit.  Will await results and treat accordingly.   Relevant Orders   Urinalysis, Routine w reflex microscopic        Follow up plan: Return in about 3 months (around 11/08/2023) for Depression/Anxiety FU.

## 2023-08-11 NOTE — Assessment & Plan Note (Signed)
 Chronic.  Controlled with PRN hydroxyzine.  Continue with current medication regimen.  Follow up in 6 months.  Call sooner if concerns arise.

## 2023-08-11 NOTE — Assessment & Plan Note (Signed)
 No symptoms reported.  CTA and echocardiogram in the spring showed stable findings.  Continue indefinite anticoagulation with warfarin.  INR followed by Cardiology.  Continue annual follow-up with Dr. Laneta Simmers.

## 2023-08-11 NOTE — Assessment & Plan Note (Signed)
 Labs ordered at visit today.  Will make recommendations based on lab results.

## 2023-08-11 NOTE — Addendum Note (Signed)
 Addended by: Larae Grooms on: 08/11/2023 04:17 PM   Modules accepted: Orders

## 2023-08-12 LAB — COMPREHENSIVE METABOLIC PANEL
ALT: 14 IU/L (ref 0–44)
AST: 16 IU/L (ref 0–40)
Albumin: 4.1 g/dL (ref 3.8–4.9)
Alkaline Phosphatase: 69 IU/L (ref 44–121)
BUN/Creatinine Ratio: 8 — ABNORMAL LOW (ref 9–20)
BUN: 9 mg/dL (ref 6–24)
Bilirubin Total: 0.8 mg/dL (ref 0.0–1.2)
CO2: 22 mmol/L (ref 20–29)
Calcium: 8.7 mg/dL (ref 8.7–10.2)
Chloride: 105 mmol/L (ref 96–106)
Creatinine, Ser: 1.12 mg/dL (ref 0.76–1.27)
Globulin, Total: 2.3 g/dL (ref 1.5–4.5)
Glucose: 106 mg/dL — ABNORMAL HIGH (ref 70–99)
Potassium: 4.6 mmol/L (ref 3.5–5.2)
Sodium: 140 mmol/L (ref 134–144)
Total Protein: 6.4 g/dL (ref 6.0–8.5)
eGFR: 77 mL/min/{1.73_m2} (ref >59)

## 2023-08-12 LAB — HEMOGLOBIN A1C
Est. average glucose Bld gHb Est-mCnc: 111 mg/dL
Hgb A1c MFr Bld: 5.5 % (ref 4.8–5.6)

## 2023-08-12 LAB — LIPID PANEL
Chol/HDL Ratio: 2.4 ratio (ref 0.0–5.0)
Cholesterol, Total: 87 mg/dL — ABNORMAL LOW (ref 100–199)
HDL: 36 mg/dL — ABNORMAL LOW (ref >39)
LDL Chol Calc (NIH): 37 mg/dL (ref 0–99)
Triglycerides: 62 mg/dL (ref 0–149)
VLDL Cholesterol Cal: 14 mg/dL (ref 5–40)

## 2023-08-15 LAB — URINE CULTURE

## 2023-08-25 ENCOUNTER — Inpatient Hospital Stay
Admission: EM | Admit: 2023-08-25 | Discharge: 2023-08-28 | DRG: 308 | Disposition: A | Discharge status: Home / Self Care | Attending: Internal Medicine | Admitting: Internal Medicine

## 2023-08-25 ENCOUNTER — Emergency Department

## 2023-08-25 ENCOUNTER — Other Ambulatory Visit: Payer: Self-pay

## 2023-08-25 ENCOUNTER — Encounter: Payer: Self-pay | Admitting: Internal Medicine

## 2023-08-25 DIAGNOSIS — Z888 Allergy status to other drugs, medicaments and biological substances status: Secondary | ICD-10-CM

## 2023-08-25 DIAGNOSIS — E785 Hyperlipidemia, unspecified: Secondary | ICD-10-CM | POA: Diagnosis present

## 2023-08-25 DIAGNOSIS — Z5986 Financial insecurity: Secondary | ICD-10-CM

## 2023-08-25 DIAGNOSIS — R001 Bradycardia, unspecified: Secondary | ICD-10-CM | POA: Diagnosis present

## 2023-08-25 DIAGNOSIS — E611 Iron deficiency: Secondary | ICD-10-CM | POA: Diagnosis present

## 2023-08-25 DIAGNOSIS — Z8249 Family history of ischemic heart disease and other diseases of the circulatory system: Secondary | ICD-10-CM

## 2023-08-25 DIAGNOSIS — Z7982 Long term (current) use of aspirin: Secondary | ICD-10-CM

## 2023-08-25 DIAGNOSIS — I2510 Atherosclerotic heart disease of native coronary artery without angina pectoris: Secondary | ICD-10-CM | POA: Diagnosis present

## 2023-08-25 DIAGNOSIS — Z79899 Other long term (current) drug therapy: Secondary | ICD-10-CM

## 2023-08-25 DIAGNOSIS — Z1152 Encounter for screening for COVID-19: Secondary | ICD-10-CM

## 2023-08-25 DIAGNOSIS — Z8673 Personal history of transient ischemic attack (TIA), and cerebral infarction without residual deficits: Secondary | ICD-10-CM

## 2023-08-25 DIAGNOSIS — E876 Hypokalemia: Secondary | ICD-10-CM | POA: Diagnosis present

## 2023-08-25 DIAGNOSIS — I71019 Dissection of thoracic aorta, unspecified: Secondary | ICD-10-CM | POA: Insufficient documentation

## 2023-08-25 DIAGNOSIS — N289 Disorder of kidney and ureter, unspecified: Secondary | ICD-10-CM | POA: Diagnosis not present

## 2023-08-25 DIAGNOSIS — I447 Left bundle-branch block, unspecified: Secondary | ICD-10-CM | POA: Diagnosis present

## 2023-08-25 DIAGNOSIS — I252 Old myocardial infarction: Secondary | ICD-10-CM

## 2023-08-25 DIAGNOSIS — I4891 Unspecified atrial fibrillation: Secondary | ICD-10-CM | POA: Diagnosis not present

## 2023-08-25 DIAGNOSIS — Z952 Presence of prosthetic heart valve: Secondary | ICD-10-CM

## 2023-08-25 DIAGNOSIS — I130 Hypertensive heart and chronic kidney disease with heart failure and stage 1 through stage 4 chronic kidney disease, or unspecified chronic kidney disease: Secondary | ICD-10-CM | POA: Diagnosis present

## 2023-08-25 DIAGNOSIS — I480 Paroxysmal atrial fibrillation: Principal | ICD-10-CM | POA: Diagnosis present

## 2023-08-25 DIAGNOSIS — Z91041 Radiographic dye allergy status: Secondary | ICD-10-CM

## 2023-08-25 DIAGNOSIS — I5022 Chronic systolic (congestive) heart failure: Secondary | ICD-10-CM | POA: Diagnosis present

## 2023-08-25 DIAGNOSIS — K760 Fatty (change of) liver, not elsewhere classified: Secondary | ICD-10-CM | POA: Diagnosis present

## 2023-08-25 DIAGNOSIS — N182 Chronic kidney disease, stage 2 (mild): Secondary | ICD-10-CM | POA: Diagnosis present

## 2023-08-25 DIAGNOSIS — I4892 Unspecified atrial flutter: Secondary | ICD-10-CM | POA: Diagnosis present

## 2023-08-25 DIAGNOSIS — G4733 Obstructive sleep apnea (adult) (pediatric): Secondary | ICD-10-CM | POA: Diagnosis present

## 2023-08-25 DIAGNOSIS — R079 Chest pain, unspecified: Principal | ICD-10-CM | POA: Diagnosis present

## 2023-08-25 DIAGNOSIS — N281 Cyst of kidney, acquired: Secondary | ICD-10-CM | POA: Diagnosis present

## 2023-08-25 DIAGNOSIS — I428 Other cardiomyopathies: Secondary | ICD-10-CM | POA: Diagnosis present

## 2023-08-25 DIAGNOSIS — Z83438 Family history of other disorder of lipoprotein metabolism and other lipidemia: Secondary | ICD-10-CM

## 2023-08-25 DIAGNOSIS — I10 Essential (primary) hypertension: Secondary | ICD-10-CM | POA: Diagnosis present

## 2023-08-25 DIAGNOSIS — K219 Gastro-esophageal reflux disease without esophagitis: Secondary | ICD-10-CM | POA: Diagnosis present

## 2023-08-25 DIAGNOSIS — R002 Palpitations: Secondary | ICD-10-CM | POA: Diagnosis present

## 2023-08-25 DIAGNOSIS — Z7901 Long term (current) use of anticoagulants: Secondary | ICD-10-CM

## 2023-08-25 DIAGNOSIS — I959 Hypotension, unspecified: Secondary | ICD-10-CM | POA: Diagnosis not present

## 2023-08-25 LAB — CBC WITH DIFFERENTIAL/PLATELET
Abs Immature Granulocytes: 0.02 10*3/uL (ref 0.00–0.07)
Basophils Absolute: 0 10*3/uL (ref 0.0–0.1)
Basophils Relative: 0 %
Eosinophils Absolute: 0.1 10*3/uL (ref 0.0–0.5)
Eosinophils Relative: 2 %
HCT: 35.7 % — ABNORMAL LOW (ref 39.0–52.0)
Hemoglobin: 12.2 g/dL — ABNORMAL LOW (ref 13.0–17.0)
Immature Granulocytes: 0 %
Lymphocytes Relative: 26 %
Lymphs Abs: 1.6 10*3/uL (ref 0.7–4.0)
MCH: 32.6 pg (ref 26.0–34.0)
MCHC: 34.2 g/dL (ref 30.0–36.0)
MCV: 95.5 fL (ref 80.0–100.0)
Monocytes Absolute: 0.4 10*3/uL (ref 0.1–1.0)
Monocytes Relative: 6 %
Neutro Abs: 4.2 10*3/uL (ref 1.7–7.7)
Neutrophils Relative %: 66 %
Platelets: 152 10*3/uL (ref 150–400)
RBC: 3.74 MIL/uL — ABNORMAL LOW (ref 4.22–5.81)
RDW: 14.7 % (ref 11.5–15.5)
WBC: 6.3 10*3/uL (ref 4.0–10.5)
nRBC: 0 % (ref 0.0–0.2)

## 2023-08-25 LAB — PROTIME-INR
INR: 2.6 — ABNORMAL HIGH (ref 0.8–1.2)
Prothrombin Time: 28 s — ABNORMAL HIGH (ref 11.4–15.2)

## 2023-08-25 LAB — TROPONIN I (HIGH SENSITIVITY)
Troponin I (High Sensitivity): 34 ng/L — ABNORMAL HIGH (ref <18)
Troponin I (High Sensitivity): 38 ng/L — ABNORMAL HIGH (ref <18)

## 2023-08-25 LAB — COMPREHENSIVE METABOLIC PANEL
ALT: 14 U/L (ref 0–44)
AST: 21 U/L (ref 15–41)
Albumin: 3.5 g/dL (ref 3.5–5.0)
Alkaline Phosphatase: 49 U/L (ref 38–126)
Anion gap: 9 (ref 5–15)
BUN: 13 mg/dL (ref 6–20)
CO2: 21 mmol/L — ABNORMAL LOW (ref 22–32)
Calcium: 8.2 mg/dL — ABNORMAL LOW (ref 8.9–10.3)
Chloride: 108 mmol/L (ref 98–111)
Creatinine, Ser: 0.9 mg/dL (ref 0.61–1.24)
GFR, Estimated: >60 mL/min (ref >60)
Glucose, Bld: 125 mg/dL — ABNORMAL HIGH (ref 70–99)
Potassium: 3 mmol/L — ABNORMAL LOW (ref 3.5–5.1)
Sodium: 138 mmol/L (ref 135–145)
Total Bilirubin: 0.8 mg/dL (ref 0.0–1.2)
Total Protein: 6.1 g/dL — ABNORMAL LOW (ref 6.5–8.1)

## 2023-08-25 LAB — BRAIN NATRIURETIC PEPTIDE: B Natriuretic Peptide: 213.9 pg/mL — ABNORMAL HIGH (ref 0.0–100.0)

## 2023-08-25 LAB — MAGNESIUM: Magnesium: 2 mg/dL (ref 1.7–2.4)

## 2023-08-25 MED ORDER — DIPHENHYDRAMINE HCL 25 MG PO CAPS: 50.0000 mg | ORAL_CAPSULE | Freq: Once | ORAL | Status: AC

## 2023-08-25 MED ORDER — POTASSIUM CHLORIDE 10 MEQ/100ML IV SOLN
10.0000 meq | Freq: Once | INTRAVENOUS | Status: AC
  Administered 2023-08-25: 10 meq via INTRAVENOUS
  Filled 2023-08-25: qty 100

## 2023-08-25 MED ORDER — DIPHENHYDRAMINE HCL 50 MG/ML IJ SOLN
50.0000 mg | Freq: Once | INTRAMUSCULAR | Status: AC
  Administered 2023-08-25: 50 mg via INTRAVENOUS
  Filled 2023-08-25: qty 1

## 2023-08-25 MED ORDER — POTASSIUM CHLORIDE CRYS ER 20 MEQ PO TBCR
40.0000 meq | EXTENDED_RELEASE_TABLET | Freq: Once | ORAL | Status: AC
  Administered 2023-08-25: 40 meq via ORAL
  Filled 2023-08-25: qty 2

## 2023-08-25 MED ORDER — ASPIRIN 81 MG PO TBEC
81.0000 mg | DELAYED_RELEASE_TABLET | Freq: Every day | ORAL | Status: DC
Start: 2023-08-26 — End: 2023-08-28
  Administered 2023-08-26 – 2023-08-28 (×3): 81 mg via ORAL
  Filled 2023-08-25 (×3): qty 1

## 2023-08-25 MED ORDER — PRAVASTATIN SODIUM 20 MG PO TABS
20.0000 mg | ORAL_TABLET | Freq: Every day | ORAL | Status: DC
  Administered 2023-08-26 – 2023-08-27 (×2): 20 mg via ORAL
  Filled 2023-08-25 (×2): qty 1

## 2023-08-25 MED ORDER — DILTIAZEM HCL-DEXTROSE 125-5 MG/125ML-% IV SOLN (PREMIX)
5.0000 mg/h | INTRAVENOUS | Status: DC
  Administered 2023-08-25: 5 mg/h via INTRAVENOUS
  Filled 2023-08-25: qty 125

## 2023-08-25 MED ORDER — SODIUM CHLORIDE 0.9 % IV BOLUS
1000.0000 mL | Freq: Once | INTRAVENOUS | Status: AC
  Administered 2023-08-25: 1000 mL via INTRAVENOUS

## 2023-08-25 MED ORDER — WARFARIN - PHARMACIST DOSING INPATIENT
Freq: Every day | Status: DC
  Filled 2023-08-25: qty 1

## 2023-08-25 MED ORDER — DILTIAZEM HCL 25 MG/5ML IV SOLN
10.0000 mg | Freq: Once | INTRAVENOUS | Status: AC
  Administered 2023-08-25: 10 mg via INTRAVENOUS
  Filled 2023-08-25: qty 5

## 2023-08-25 MED ORDER — ONDANSETRON HCL 4 MG/2ML IJ SOLN: 4.0000 mg | Freq: Four times a day (QID) | INTRAMUSCULAR | Status: DC | PRN

## 2023-08-25 MED ORDER — IOHEXOL 350 MG/ML SOLN
100.0000 mL | Freq: Once | INTRAVENOUS | Status: AC | PRN
  Administered 2023-08-25: 100 mL via INTRAVENOUS

## 2023-08-25 MED ORDER — WARFARIN SODIUM 2.5 MG PO TABS
2.5000 mg | ORAL_TABLET | Freq: Once | ORAL | Status: AC
  Administered 2023-08-25: 2.5 mg via ORAL
  Filled 2023-08-25: qty 1

## 2023-08-25 MED ORDER — ACETAMINOPHEN 325 MG PO TABS
650.0000 mg | ORAL_TABLET | ORAL | Status: DC | PRN
  Administered 2023-08-26: 650 mg via ORAL
  Filled 2023-08-25: qty 2

## 2023-08-25 MED ORDER — METHYLPREDNISOLONE SODIUM SUCC 40 MG IJ SOLR
40.0000 mg | Freq: Once | INTRAMUSCULAR | Status: AC
  Administered 2023-08-25: 40 mg via INTRAVENOUS
  Filled 2023-08-25: qty 1

## 2023-08-25 MED ORDER — FERROUS SULFATE 325 (65 FE) MG PO TABS
650.0000 mg | ORAL_TABLET | Freq: Every day | ORAL | Status: DC
  Administered 2023-08-26 – 2023-08-28 (×3): 650 mg via ORAL
  Filled 2023-08-25 (×3): qty 2

## 2023-08-25 NOTE — ED Provider Notes (Signed)
 Procedures  Clinical Course as of 08/25/23 1359  Wed Aug 25, 2023  0424 Troponin I (High Sensitivity)(!): 34 Overall similar to prior baseline.  Reassessed with no ongoing chest pain.  Will repeat troponin and follow-up CTA of the chest. [DW]  0500 Repeat EKG now that heart rate is in the 70s is nearly identical to most recent 1 on 04/10/2023 with no new ischemic changes. [DW]  0612 Troponin I (High Sensitivity)(!): 38 Stable troponin [DW]    Clinical Course User Index [DW] Janith Lima, MD    ----------------------------------------- 1:59 PM on 08/25/2023 ----------------------------------------- CT angiogram is unchanged from previous on October 03, 2022.  Reviewing outside records, he saw thoracic surgery shortly after the last CT scan, on Oct 28, 2022, where these findings were deemed to be stable and nonsurgical.  Vital signs are normalized on the diltiazem infusion and he reports that the chest pain has resolved.  Case discussed with hospitalist for further management.     Sharman Cheek, MD 08/25/23 1400

## 2023-08-25 NOTE — H&P (Signed)
 History and Physical    Patient: Ryan [name]:[id number] DOB: 09-07-1965 DOA: 08/25/2023 DOS: the patient was seen and examined on 08/25/2023 PCP: Larae Grooms, NP  Patient coming from: Home  Chief Complaint:  Chief Complaint  Patient presents with   Chest Pain   HPI: Ryan [name] is a 58 y.o. 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  Ryan [name] states that he was laying in bed and watching TV when he suddenly noticed that he could hear his heartbeat in his ears.  They checked his heart rate using his home monitor and noted that it was high at 160.  After about 15 to 20 minutes, he noticed that he began to experience right-sided and substernal chest pain with radiation to his back.  He denies any recent illness, nausea, vomiting, diarrhea.  He denies any orthopnea or lower extremity swelling.  He notes that approximately 2 weeks ago, he called his cardiologist as he had noticed his heart rate was high in the low 110s.  Ryan [name] states that his chest pain is gone now that his heart rate is better.  ED course: On arrival to the ED, patient was hypotensive at 93/52 with heart rate of 77.  He was afebrile at 98.1.  He was started on diltiazem but subsequently went back into atrial fibrillation with RVR requiring diltiazem infusion.  Workup notable for potassium of 3.0, BNP 213, troponin 34 and 38, and hemoglobin of 12.2.  INR 2.6.  CTA pan scan with no acute findings to explain pain, however lesion extending from right kidney concerning for possible neoplastic process.  TRH contacted for admission.   Review of Systems: As mentioned in the history of present illness. All other systems reviewed and are negative.  Past Medical History:   Diagnosis Date   Acute cholecystitis 12/25/2021   Acute cystitis with hematuria 11/20/2020   Last Assessment & Plan:   Formatting of this note might be different from the original.  Patient completed a course of Keflex. Symptoms resolved. Denies dysuria, frequency, urgency, fevers, chills, abdominal pain or cramps.   Anemia    Aortic aneurysm and dissection (HCC)    a. 2001 s/p grafting and AVR; b. 09/2021 CTA Chest: Dil Ao root up to 51mm. Ao arch 46mm. Desc Ao 54mm. Complicated arch dissection w/ mult false and/or partially thrombosed lumens. Dissection flaps extend into all great vessels except L carotid. Dissection cont into R CCA and into abd->L RA.   Chronic anticoagulation    Chronic cholecystitis 01/31/2022   Dissecting aortic aneurysm, thoracoabdominal (HCC)    Fatty liver    GERD (gastroesophageal reflux disease)    H/O mechanical aortic valve replacement    a. 2001 s/p AVR in setting of Asc Ao aneurysm repair; b. 08/2021 Echo: EF 50-55%, no rwma, mild LVH, GrII DD, nl RV fxn, mild MR, triv AI w/ mean AoV grad .   Heart murmur    History of cardiac catheterization    a. 08/2019 MV: basal-dist lateral and inflat defect; b. 07/2020 Cath: nl cors.   Hyperlipidemia    Hypertension    Hypolipidemia    Iron deficiency    PAF (paroxysmal atrial fibrillation) (HCC)    a. CHA2DS2VASc = 3.  Chronic warfarin (also mech AVR).  Pulmonary nodule    Sleep apnea    Stroke [facility])    Past Surgical History:  Procedure Laterality Date   ABDOMINAL SURGERY     AORTIC VALVE REPLACEMENT  2001   [employer]   ARTHROSCOPIC REPAIR ACL     CHOLECYSTECTOMY  02/01/2022   COLONOSCOPY  2019   IR PERC CHOLECYSTOSTOMY  12/26/2021   MENISCUS REPAIR     Social History:  reports that he has never smoked. He has been exposed to tobacco smoke. He has never used smokeless tobacco. He reports current alcohol use. He reports that he does not use drugs.  Allergies  Allergen Reactions   Alitraq  Rash   Iodinated Contrast Media Rash   Iodine Rash    CT dye CT dye  CT dye CT dye    Family History  Problem Relation Age of Onset   Heart attack Mother    Hypertension Mother    Hyperlipidemia Mother    Heart disease Mother    Heart attack Maternal Grandfather     Prior to Admission medications   Medication Sig Start Date End Date Taking? Authorizing Provider  acetaminophen (TYLENOL) 500 MG tablet Take 500 mg by mouth every 6 (six) hours as needed for headache, fever, moderate pain or mild pain.    [provider]  aspirin 81 MG EC tablet Take 81 mg by mouth daily.   Yes [provider]  Ferrous Sulfate Dried (HIGH POTENCY IRON) 65 MG TABS Take 2 tablets by mouth daily. 08/13/21  Yes [provider]  fluticasone (FLONASE) 50 MCG/ACT nasal spray Place 1 spray into both nostrils 2 (two) times daily. 04/10/23  Yes Cuthriell, Delorise Royals, PA-C  hydrOXYzine (VISTARIL) 25 MG capsule Take 1 capsule (25 mg total) by mouth every 12 (twelve) hours as needed. 04/06/23  Yes Pearley, Sherran Needs, NP  lisinopril (ZESTRIL) 2.5 MG tablet Take 1 tablet (2.5 mg total) by mouth daily. 10/27/22  Yes Furth, Cadence H, PA-C  lovastatin (MEVACOR) 20 MG tablet TAKE 1 TABLET BY MOUTH AT BEDTIME 04/26/23  Yes Larae Grooms, NP  meclizine (ANTIVERT) 50 MG tablet Take 1 tablet (50 mg total) by mouth 3 (three) times daily as needed. 04/10/23   Cuthriell, Delorise Royals, PA-C  oxymetazoline (AFRIN) 0.05 % nasal spray Place 1 spray into both nostrils 2 (two) times daily.    [provider]  Potassium 99 MG TABS Take 2 tablets by mouth every evening.   Yes [provider]  sodium chloride (OCEAN) 0.65 % SOLN nasal spray Place 1 spray into both nostrils as needed for up to 14 days for congestion. 04/02/23 04/16/23  Weber Cooks, NP  warfarin (COUMADIN) 5 MG tablet TAKE ONE-HALF TO ONE TABLET BY MOUTH ONCE DAILY AS INSTRUCTED BY ANTICOAGULATION CLINIC 04/19/23    End, Cristal Deer, MD    Physical Exam: Vitals:   08/25/23 1200 08/25/23 1215 08/25/23 1330 08/25/23 1400  BP: (!) 84/52 107/68 111/67 109/72  Pulse: 76 76 76 72  Resp: 20 20  18   Temp:      TempSrc:      SpO2: 98% 98% 100% 100%  Weight:      Height:       Physical Exam Vitals and nursing note reviewed.  Constitutional:      General: He is not in acute distress.    Appearance: He is normal weight. He is not toxic-appearing.  HENT:     Head: Normocephalic and atraumatic.  Mouth/Throat:     Mouth: Mucous membranes are moist.     Pharynx: Oropharynx is clear.  Cardiovascular:     Rate and Rhythm: Normal rate and regular rhythm.     Heart sounds: No murmur heard.    Comments: Telemetry with sinus rhythm during examination Pulmonary:     Effort: Pulmonary effort is normal. No tachypnea or respiratory distress.     Breath sounds: No decreased breath sounds, wheezing, rhonchi or rales.  Abdominal:     General: There is no distension.     Palpations: Abdomen is soft.     Tenderness: There is no abdominal tenderness.  Musculoskeletal:     Right lower leg: No edema.     Left lower leg: No edema.  Skin:    General: Skin is warm and dry.  Neurological:     General: No focal deficit present.     Mental Status: He is alert and oriented to person, place, and time.  Psychiatric:        Mood and Affect: Mood normal.        Behavior: Behavior normal.    Data Reviewed: CBC WBC of 6.3, hemoglobin of 12.2, platelets 152 CMP with sodium 138, potassium 3.0, bicarb 21, glucose 125, BUN 13, creatinine 0.90, AST 21, ALT 14, GFR above 60 BNP 213.9 Troponin 34 and then 38   Ischial EKG with wide-complex tachycardia with rate of 154, subtle irregularity concerning for atrial fibrillation. Second EKG with sinus rhythm and rate of 75.  Left bundle branch block.  CT Angio Chest/Abd/Pel for Dissection W and/or Wo Contrast Result Date: 08/25/2023 CLINICAL DATA:  Sharp midline chest pain  radiating to the back. Prior history of aortic dissection. Evaluating for recurrence of dissection or worsening. EXAM: CT ANGIOGRAPHY CHEST, ABDOMEN AND PELVIS TECHNIQUE: Non-contrast CT of the chest was initially obtained. Multidetector CT imaging through the chest, abdomen and pelvis was performed using the standard protocol during bolus administration of intravenous contrast. Multiplanar reconstructed images and MIPs were obtained and reviewed to evaluate the vascular anatomy. RADIATION DOSE REDUCTION: This exam was performed according to the departmental dose-optimization program which includes automated exposure control, adjustment of the mA and/or kV according to patient size and/or use of iterative reconstruction technique. CONTRAST:  OMNIPAQUE IOHEXOL 350 MG/ML SOLN COMPARISON:  CT angiography chest, abdomen and pelvis from 10/11/2022. FINDINGS: CTA CHEST FINDINGS Cardiovascular: No intramural hematoma noted in the thoracic aorta on the unenhanced images. 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 The aortic arch measures up to 4.4-4.6 cm (previously when measured in similar fashion it measured 4.4-4.4 cm). The proximal descending thoracic aorta measures 5.3 x 5.5 cm (previously 5.2 x 5.4 cm). The native aneurysmal sac dimensions in the abdomen are grossly unchanged. Proximally the dissection flap extends slightly in the origin of the right innominate artery and extends into the right common carotid artery however, superior extent of the dissection flap extension is not imaged on this exam. Please note, the involvement is essentially similar to the prior study. The left common carotid artery arises from the true lumen only. The dissection flap extends into the proximal portion of left subclavian artery for short length, similar to the prior study. In the descending thoracic aorta,  the true lumen is along the anterior aspect extending from 11-1 o'clock position, similar to the prior study. The true lumen occupies less than 25% of the cross-sectional surface of the  aortic lumen. The The celiac trunk, superior mesenteric artery and inferior mesenteric artery arises from the true lumen. The right renal artery predominantly arises from the true lumen however, the flap extends at the origin of the artery. The left renal artery arises from the true and false lumen with smaller true lumen along the anterior aspect. Normal cardiac size. No pericardial effusion. There is satisfactory opacification of pulmonary arteries. There is no embolism to the proximal subsegmental pulmonary artery level. Prosthetic aortic valve noted. There is dilation of the aortic root; however, similar to the prior study. Mediastinum/Nodes: Visualized thyroid gland appears grossly unremarkable. No solid / cystic mediastinal masses. The esophagus is nondistended precluding optimal assessment. There are few mildly prominent mediastinal lymph nodes, which do not meet the size criteria for lymphadenopathy but new since the prior study. These are indeterminate but favored benign/reactive in the given clinical setting. No axillary or hilar lymphadenopathy by size criteria. Lungs/Pleura: The central tracheo-bronchial tree is patent. There is mild, smooth, circumferential thickening of the segmental and subsegmental bronchial walls, throughout bilateral lungs, which is nonspecific. Findings are most commonly seen with bronchitis or reactive airway disease, such as asthma. There are patchy areas of linear, plate-like atelectasis and/or scarring throughout bilateral lungs. no mass or consolidation. No pleural effusion or pneumothorax. There are several, stable, sub 4 mm, calcified and noncalcified nodules in the bilateral upper lobes (marked with electronic arrow sign on series 2009). No new or suspicious lung nodule. Musculoskeletal:  The visualized soft tissues of the chest wall are grossly unremarkable. No suspicious osseous lesions. There are mild multilevel degenerative changes in the visualized spine. Sternotomy wires noted. Review of the MIP images confirms the above findings. CTA ABDOMEN AND PELVIS FINDINGS VASCULAR Aorta: Please see above for details. Celiac:  Please see above for details. SMA:  Please see above for details. Renals:  Please see above for details. IMA:  Please see above for details. Inflow:  Please see above for details. Veins: No obvious venous abnormality within the limitations of this arterial phase study. Review of the MIP images confirms the above findings. NON-VASCULAR Hepatobiliary: The liver is normal in size. Non-cirrhotic configuration. No suspicious mass. No intrahepatic or extrahepatic bile duct dilation. Gallbladder is surgically absent. Pancreas: Unremarkable. No pancreatic ductal dilatation or surrounding inflammatory changes. Spleen: The Within normal limits. No focal lesion. Adrenals/Urinary Tract: Adrenal glands are unremarkable. There is an approximately 1.4 x 1.8 cm hypoattenuating lesion arising from the right kidney lower pole, anteriorly. Exact comparison with prior imaging is limited however, the lesion previously measured up to 1.1 x 1.1 cm. The lesion exhibits internal CT attenuation of 38 Hounsfield units and is concerning for neoplastic process. Further evaluation with multiphasic contrast-enhanced MRI abdomen as per renal mass protocol is recommended. There is an additional 9 x 10 mm partially exophytic lesion arising from the left kidney lower pole, medially. The lesion exhibits internal CT attenuation of 53-58 Hounsfield units. The lesion appears grossly unchanged since the prior study. This is also indeterminate and can not be evaluated with the multiphasic MRI abdomen as previously recommended. Redemonstration of a partially exophytic approximately 1 cm sized hypoattenuating structure  arising from the right kidney lower pole, posteriorly. There is an additional simple cyst in the right kidney upper pole. No hydroureteronephrosis on either side. Bilateral extrarenal pelves noted. No nephroureterolithiasis. Unremarkable urinary bladder. Stomach/Bowel: No disproportionate dilation of the small or large bowel loops. No evidence of abnormal bowel wall thickening or inflammatory changes. The appendix is unremarkable.  There are multiple diverticula throughout the colon, without imaging signs of diverticulitis. Vascular/Lymphatic: No ascites or pneumoperitoneum. Redemonstration of mistiness in the central mesentery about the mesenteric vessels along with multiple subcentimeter sized nodules with spared halo and few dystrophic calcifications, essentially similar to the prior study. Findings are nonspecific but in appropriate clinical settings can be seen with sclerosing mesenteritis. Reproductive: Enlarged prostate. Symmetric seminal vesicles. Other: There is partially imaged at least moderate sized left inguinal scrotal hernia containing fat. There is small fat containing right inguinal hernia. The soft tissues and abdominal wall are otherwise unremarkable. Musculoskeletal: No suspicious osseous lesions. There are mild - moderate multilevel degenerative changes in the visualized spine. Review of the MIP images confirms the above findings. IMPRESSION: 1. 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 There is minimal increase in the diameter of aneurysmal sac in the thoracic aorta. No significant interval change in the aneurysmal sac dimensions in the abdominal aorta. Please see elbow for details of the true lumen/false lumen and extension of the dissection flap. 2. There is an approximately 1.4 x 1.8 cm hypoattenuating lesion arising from the right kidney lower pole, anteriorly. Exact  comparison with prior imaging is limited however, the lesion previously measured up to 1.1 x 1.1 cm. The lesion exhibits internal CT attenuation of 38 Hounsfield units and is concerning for neoplastic process. Further evaluation with multiphasic contrast-enhanced MRI abdomen as per renal mass protocol is recommended. 3. There is an additional 9 x 10 mm partially exophytic lesion arising from the left kidney lower pole, medially. The lesion exhibits internal CT attenuation of 53-58 Hounsfield units. The lesion appears grossly unchanged since the prior study. This is also indeterminate and can not be evaluated with the multiphasic MRI abdomen. 4. Multiple other nonacute observations, as described above. 5. Aortic atherosclerosis. Aortic Atherosclerosis (ICD10-I70.0). Electronically Signed   By: Jules Schick M.D.   On: 08/25/2023 09:33   DG Chest Portable 1 View Result Date: 08/25/2023 CLINICAL DATA:  Chest pain with shortness of breath and AFib. EXAM: PORTABLE CHEST 1 VIEW COMPARISON:  10/11/2022 FINDINGS: Previous median sternotomy. Cardiomediastinal contours are stable. There is enlargement of the aortic knob compatible with known aneurysm. Pulmonary vascular congestion. No pleural fluid, interstitial edema or airspace disease. The visualized osseous structures are unremarkable. IMPRESSION: 1. Pulmonary vascular congestion. 2. Enlargement of the aortic knob compatible with known aneurysm. Electronically Signed   By: Signa Kell M.D.   On: 08/25/2023 05:50   Results are pending, will review when available.  Assessment and Plan:  * Atrial fibrillation with RVR (HCC) Patient presented with palpitations and chest pain found to be in atrial fibrillation with RVR.  Chest pain has resolved with improvement in rates.  Unclear trigger for RVR at this time, potentially due to hypokalemia.  - Telemetry monitoring - Continue diltiazem infusion - Potassium supplementation - Check magnesium - Continue home  warfarin per pharmacy dosing  Chronic HFrEF (heart failure with reduced ejection fraction) (HCC) Patient has a history of HFmrEF with last EF of 40-45%, not currently requiring diuretic therapy.  He appears euvolemic on examination with no peripheral edema and reassuring pulmonary examination.  Due to this and hypokalemia, will hold off on Lasix at this time despite increased BNP.  - Telemetry monitoring - Daily weights - Strict in and out  Lesion of right native kidney On CT imaging today, there is a 1.4 x 1.8 lesion, previously 1.1 x  1.1,  with increased HU concerning for neoplastic process.  Discussed this with patient and he is eager to obtain MRI for further characterization.    - MRI abdomen with contrast  Chronic thoracic aortic dissection (HCC) CT imaging today demonstrates stability.  Continue outpatient follow-up with cardiothoracic surgery.  Essential hypertension - Hold home antihypertensives given need for rate control of A-fib  OSA (obstructive sleep apnea) Patient states he has been unable to tolerate his CPAP mask for several years now.  Encouraged that he consider nasal pillows as an alternative  Status post mechanical aortic valve replacement - Continue warfarin per pharmacy dosing  Advance Care Planning:   Code Status: Full Code   Consults: None  Family Communication: No family at bed side  Severity of Illness: The appropriate patient status for this patient is OBSERVATION. Observation status is judged to be reasonable and necessary in order to provide the required intensity of service to ensure the patient's safety. The patient's presenting symptoms, physical exam findings, and initial radiographic and laboratory data in the context of their medical condition is felt to place them at decreased risk for further clinical deterioration. Furthermore, it is anticipated that the patient will be medically stable for discharge from the hospital within 2 midnights of  admission.   Author: Verdene Lennert, MD 08/25/2023 3:16 PM  For on call review [email or web address].

## 2023-08-25 NOTE — Assessment & Plan Note (Signed)
 Patient states he has been unable to tolerate his CPAP mask for several years now.  Encouraged that he consider nasal pillows as an alternative

## 2023-08-25 NOTE — Assessment & Plan Note (Addendum)
 Patient has a history of HFmrEF with last EF of 40-45%, not currently requiring diuretic therapy.  He appears euvolemic on examination with no peripheral edema and reassuring pulmonary examination.  Due to this and hypokalemia, will hold off on Lasix at this time despite increased BNP.  - Telemetry monitoring - Daily weights - Strict in and out

## 2023-08-25 NOTE — Consult Note (Signed)
 PHARMACY - ANTICOAGULATION CONSULT NOTE  Pharmacy Consult for Warfarin management Indication: Afib and mechanical AVR  Allergies  Allergen Reactions   Alitraq Rash   Iodinated Contrast Media Rash   Iodine Rash    CT dye CT dye  CT dye CT dye    Patient Measurements: Height: 5\' 6"  (167.6 cm) Weight: 79.4 kg (175 lb) IBW/kg (Calculated) : 63.8   Vital Signs: Temp: 98.1 F (36.7 C) (03/12 0900) Temp Source: Oral (03/12 0900) BP: 109/72 (03/12 1400) Pulse Rate: 72 (03/12 1400)  Labs: Recent Labs    08/25/23 0319 08/25/23 0527  HGB 12.2*  --   HCT 35.7*  --   PLT 152  --   LABPROT 28.0*  --   INR 2.6*  --   CREATININE 0.90  --   TROPONINIHS 34* 38*    Estimated Creatinine Clearance: 90.7 mL/min (by C-G formula based on SCr of 0.9 mg/dL).   Medical History: Past Medical History:  Diagnosis Date   Acute cholecystitis 12/25/2021   Acute cystitis with hematuria 11/20/2020   Last Assessment & Plan:   Formatting of this note might be different from the original.  Patient completed a course of Keflex. Symptoms resolved. Denies dysuria, frequency, urgency, fevers, chills, abdominal pain or cramps.   Anemia    Aortic aneurysm and dissection (HCC)    a. 2001 s/p grafting and AVR; b. 09/2021 CTA Chest: Dil Ao root up to 51mm. Ao arch 46mm. Desc Ao 54mm. Complicated arch dissection w/ mult false and/or partially thrombosed lumens. Dissection flaps extend into all great vessels except L carotid. Dissection cont into R CCA and into abd->L RA.   Chronic anticoagulation    Chronic cholecystitis 01/31/2022   Dissecting aortic aneurysm, thoracoabdominal (HCC)    Fatty liver    GERD (gastroesophageal reflux disease)    H/O mechanical aortic valve replacement    a. 2001 s/p AVR in setting of Asc Ao aneurysm repair; b. 08/2021 Echo: EF 50-55%, no rwma, mild LVH, GrII DD, nl RV fxn, mild MR, triv AI w/ mean AoV grad .   Heart murmur    History of cardiac catheterization    a.  08/2019 MV: basal-dist lateral and inflat defect; b. 07/2020 Cath: nl cors.   Hyperlipidemia    Hypertension    Hypolipidemia    Iron deficiency    PAF (paroxysmal atrial fibrillation) (HCC)    a. CHA2DS2VASc = 3.  Chronic warfarin (also mech AVR).   Pulmonary nodule    Sleep apnea    Stroke [facility])     Medications:  Per last Anti-coag note from 08/04/23 patient is currently on Warfarin 5 mg Mon/Fri and 2.5 mg AOD  Assessment: 58 yo male presented to ED with chest pain and palpitations, found to be in Afib.  Patient has PMH of mechanical AVR, Afib on Warfarin, HFpEF, MI, HTN, HLD, and CKD.  Pharmacy consulted to manage warfarin.  Goal of Therapy:  INR 2.5-3 per anticoag visit note 08/04/23  Monitor platelets by anticoagulation protocol: Yes   Plan:  INR is therapeutic at 2.6 Will give warfarin 2.5 mg po x 1 today according to home regimen Monitor CBC at least every 72 hours  Barrie Folk, PharmD 08/25/2023,3:04 PM

## 2023-08-25 NOTE — ED Provider Notes (Addendum)
 [facility] Provider Note    Event Date/Time   First MD Initiated Contact with Patient 08/25/23 [phone]     (approximate)   History   Chest Pain   HPI Ryan [name] is a 58 y.o. male with history of HFpEF, MI, HTN, HLD, CKD stage II, aortic dissection, A-fib on warfarin presenting today for chest pain.  Patient states around midnight he had onset of palpitations as well as chest pain.  He feels it in the middle and on the right side with radiation to his back.  Mild shortness of breath associated with it.  States pain symptoms have eased off at this time but felt similar to his prior heart issues as well as when he had dissection.  Currently on warfarin for his A-fib.  Otherwise denies cough, congestion, nausea, vomiting, abdominal pain, diarrhea, constipation, leg pain, leg swelling.  Received aspirin 324 mg with EMS.  Chart review: Patient had prior history of aortic dissection requiring repair     Physical Exam   Triage Vital Signs: ED Triage Vitals  Encounter Vitals Group     BP 08/25/23 0309 123/82     Systolic BP Percentile --      Diastolic BP Percentile --      Pulse Rate 08/25/23 0309 (!) 154     Resp 08/25/23 0309 19     Temp 08/25/23 0309 98.1 F (36.7 C)     Temp Source 08/25/23 0309 Oral     SpO2 08/25/23 0305 100 %     Weight 08/25/23 0311 175 lb (79.4 kg)     Height 08/25/23 0311 5\' 6"  (1.676 m)     Head Circumference --      Peak Flow --      Pain Score 08/25/23 0311 6     Pain Loc --      Pain Education --      Exclude from Growth Chart --     Most recent vital signs: Vitals:   08/25/23 0430 08/25/23 0530  BP: 97/66 (!) 93/57  Pulse: 75 (!) 54  Resp: (!) 22 (!) 39  Temp:    SpO2: 97% 99%   Physical Exam: I have reviewed the vital signs and nursing notes. General: Awake, alert, no acute distress.  Nontoxic appearing. Head:  Atraumatic, normocephalic.   ENT:  EOM intact, PERRL. Oral mucosa is pink and moist with no  lesions. Neck: Neck is supple with full range of motion, No meningeal signs. Cardiovascular: Tachycardia with a irregular rhythm, No murmurs. Peripheral pulses palpable and equal bilaterally. Respiratory:  Symmetrical chest wall expansion.  No rhonchi, rales, or wheezes.  Good air movement throughout.  No use of accessory muscles.   Musculoskeletal:  No cyanosis or edema. Moving extremities with full ROM Abdomen:  Soft, nontender, nondistended. Neuro:  GCS 15, moving all four extremities, interacting appropriately. Speech clear. Psych:  Calm, appropriate.   Skin:  Warm, dry, no rash.    ED Results / Procedures / Treatments   Labs (all labs ordered are listed, but only abnormal results are displayed) Labs Reviewed  CBC WITH DIFFERENTIAL/PLATELET - Abnormal; Notable for the following components:      Result Value   RBC 3.74 (*)    Hemoglobin 12.2 (*)    HCT 35.7 (*)    All other components within normal limits  COMPREHENSIVE METABOLIC PANEL - Abnormal; Notable for the following components:   Potassium 3.0 (*)    CO2 21 (*)  Glucose, Bld 125 (*)    Calcium 8.2 (*)    Total Protein 6.1 (*)    All other components within normal limits  BRAIN NATRIURETIC PEPTIDE - Abnormal; Notable for the following components:   B Natriuretic Peptide 213.9 (*)    All other components within normal limits  PROTIME-INR - Abnormal; Notable for the following components:   Prothrombin Time 28.0 (*)    INR 2.6 (*)    All other components within normal limits  TROPONIN I (HIGH SENSITIVITY) - Abnormal; Notable for the following components:   Troponin I (High Sensitivity) 34 (*)    All other components within normal limits  TROPONIN I (HIGH SENSITIVITY) - Abnormal; Notable for the following components:   Troponin I (High Sensitivity) 38 (*)    All other components within normal limits     EKG My EKG interpretation: Rate of 154, A-fib with RVR.  Left bundle branch block.  No other obvious criteria  for meeting ST elevation MI   RADIOLOGY Independently interpreted chest x-ray with no acute pathology   PROCEDURES:  Critical Care performed: Yes, see critical care procedure note(s)  .Critical Care  Performed by: Janith Lima, MD Authorized by: Janith Lima, MD   Critical care provider statement:    Critical care time (minutes):  30   Critical care was necessary to treat or prevent imminent or life-threatening deterioration of the following conditions: A-fib with RVR.   Critical care was time spent personally by me on the following activities:  Development of treatment plan with patient or surrogate, discussions with consultants, evaluation of patient's response to treatment, examination of patient, ordering and review of laboratory studies, ordering and review of radiographic studies, ordering and performing treatments and interventions, pulse oximetry, re-evaluation of patient's condition and review of old charts   I assumed direction of critical care for this patient from another provider in my specialty: no     Care discussed with: admitting provider      MEDICATIONS ORDERED IN ED: Medications  diphenhydrAMINE (BENADRYL) capsule 50 mg (has no administration in time range)    Or  diphenhydrAMINE (BENADRYL) injection 50 mg (has no administration in time range)  methylPREDNISolone sodium succinate (SOLU-MEDROL) 40 mg/mL injection 40 mg (40 mg Intravenous Given 08/25/23 0357)  sodium chloride 0.9 % bolus 1,000 mL (1,000 mLs Intravenous New Bag/Given 08/25/23 0348)  diltiazem (CARDIZEM) injection 10 mg (10 mg Intravenous Given 08/25/23 0352)  potassium chloride 10 mEq in 100 mL IVPB (10 mEq Intravenous New Bag/Given 08/25/23 0507)     IMPRESSION / MDM / ASSESSMENT AND PLAN / ED COURSE  I reviewed the triage vital signs and the nursing notes.                              Differential diagnosis includes, but is not limited to, A-fib with RVR, ACS, aortic dissection, pneumonia,  pneumothorax, stable angina  Patient's presentation is most consistent with acute presentation with potential threat to life or bodily function.  Patient is a 58 year old male presenting today for chest pain with A-fib with RVR.  Heart rate on arrival in the 150s.  Patient given 1 L fluid and 10 mg diltiazem bolus with improvement of heart rate into the 70s.  EKG on repeat shows no significant ischemia.  Troponin stable x 2.  Given prior history of dissection with reported hypertension at home, CTA chest/abdomen/pelvis was ordered for further evaluation.  Otherwise,  CBC, CMP unremarkable.  BMP with slight elevation but not exhibiting any symptoms of heart failure.  Chest x-ray shows no evidence of volume overload or other acute intrathoracic pathology.  Patient will be signed out to oncoming provider pending results of CTA chest.  He does note that his chest pain symptoms during the entire ED visit today have been very minimal.  He states he has chronic chest pain symptoms and currently that is exactly how it feels without any severe worsening.  He has had prior hospital admissions for chest pain workup which had been reassuring and followed up outpatient with cardiology.  While awaiting CTA chest/abdomen/pelvis, patient went back into A-fib with RVR.  Given soft pressures with systolic 104, started on diltiazem drip at this time until awaiting CTA.  Signed out to oncoming provider.  Plan for admission afterwards unless more concerning findings of possible dissection necessitate transfer.  The patient is on the cardiac monitor to evaluate for evidence of arrhythmia and/or significant heart rate changes. Clinical Course as of 08/25/23 0637  Wed Aug 25, 2023  0424 Troponin I (High Sensitivity)(!): 34 Overall similar to prior baseline.  Reassessed with no ongoing chest pain.  Will repeat troponin and follow-up CTA of the chest. [DW]  0500 Repeat EKG now that heart rate is in the 70s is nearly identical to  most recent 1 on 04/10/2023 with no new ischemic changes. [DW]  1610 Troponin I (High Sensitivity)(!): 38 Stable troponin [DW]    Clinical Course User Index [DW] Janith Lima, MD     FINAL CLINICAL IMPRESSION(S) / ED DIAGNOSES   Final diagnoses:  Chest pain, unspecified type  Atrial fibrillation with RVR (HCC)     Rx / DC Orders   ED Discharge Orders     None        Note:  This document was prepared using Dragon voice recognition software and may include unintentional dictation errors.   Janith Lima, MD 08/25/23 [id number]    Janith Lima, MD 08/25/23 [phone]

## 2023-08-25 NOTE — Assessment & Plan Note (Signed)
 CT imaging today demonstrates stability.  Continue outpatient follow-up with cardiothoracic surgery.

## 2023-08-25 NOTE — Assessment & Plan Note (Signed)
-   Hold home antihypertensives given need for rate control of A-fib

## 2023-08-25 NOTE — Assessment & Plan Note (Signed)
 On CT imaging today, there is a 1.4 x 1.8 lesion, previously 1.1 x 1.1,  with increased HU concerning for neoplastic process.  Discussed this with patient and he is eager to obtain MRI for further characterization.    - MRI abdomen with contrast

## 2023-08-25 NOTE — ED Triage Notes (Signed)
 Pt arrives from home by EMS for chest pain that started around midnight with palpitations. Pt does have hx of afib on warfarin. Pt states CP 6/10 . EMS found pt in Afib RVR with rate 150s-160s. Pt A/Ox4, NAD.

## 2023-08-25 NOTE — Assessment & Plan Note (Signed)
 Patient presented with palpitations and chest pain found to be in atrial fibrillation with RVR.  Chest pain has resolved with improvement in rates.  Unclear trigger for RVR at this time, potentially due to hypokalemia.  - Telemetry monitoring - Continue diltiazem infusion - Potassium supplementation - Check magnesium - Continue home warfarin per pharmacy dosing

## 2023-08-25 NOTE — ED Notes (Signed)
Placed order for dinner tray 

## 2023-08-25 NOTE — ED Notes (Signed)
 This RN gave report to [employer] RN and performed bedside care handoff. Call light in reach, bed wheels locked, side rail raised, pt updated on plan of care. Rounding completed.

## 2023-08-25 NOTE — Assessment & Plan Note (Signed)
-   Continue warfarin per pharmacy dosing

## 2023-08-26 ENCOUNTER — Observation Stay

## 2023-08-26 DIAGNOSIS — N281 Cyst of kidney, acquired: Secondary | ICD-10-CM | POA: Diagnosis present

## 2023-08-26 DIAGNOSIS — I480 Paroxysmal atrial fibrillation: Secondary | ICD-10-CM | POA: Diagnosis present

## 2023-08-26 DIAGNOSIS — Z952 Presence of prosthetic heart valve: Secondary | ICD-10-CM | POA: Diagnosis not present

## 2023-08-26 DIAGNOSIS — Z8249 Family history of ischemic heart disease and other diseases of the circulatory system: Secondary | ICD-10-CM | POA: Diagnosis not present

## 2023-08-26 DIAGNOSIS — Z7982 Long term (current) use of aspirin: Secondary | ICD-10-CM | POA: Diagnosis not present

## 2023-08-26 DIAGNOSIS — Z79899 Other long term (current) drug therapy: Secondary | ICD-10-CM | POA: Diagnosis not present

## 2023-08-26 DIAGNOSIS — I5022 Chronic systolic (congestive) heart failure: Secondary | ICD-10-CM | POA: Diagnosis present

## 2023-08-26 DIAGNOSIS — E785 Hyperlipidemia, unspecified: Secondary | ICD-10-CM | POA: Diagnosis present

## 2023-08-26 DIAGNOSIS — G4733 Obstructive sleep apnea (adult) (pediatric): Secondary | ICD-10-CM | POA: Diagnosis present

## 2023-08-26 DIAGNOSIS — Z8673 Personal history of transient ischemic attack (TIA), and cerebral infarction without residual deficits: Secondary | ICD-10-CM | POA: Diagnosis not present

## 2023-08-26 DIAGNOSIS — I428 Other cardiomyopathies: Secondary | ICD-10-CM | POA: Diagnosis present

## 2023-08-26 DIAGNOSIS — I447 Left bundle-branch block, unspecified: Secondary | ICD-10-CM | POA: Diagnosis present

## 2023-08-26 DIAGNOSIS — E876 Hypokalemia: Secondary | ICD-10-CM | POA: Diagnosis present

## 2023-08-26 DIAGNOSIS — Z91041 Radiographic dye allergy status: Secondary | ICD-10-CM | POA: Diagnosis not present

## 2023-08-26 DIAGNOSIS — N182 Chronic kidney disease, stage 2 (mild): Secondary | ICD-10-CM | POA: Diagnosis present

## 2023-08-26 DIAGNOSIS — Z83438 Family history of other disorder of lipoprotein metabolism and other lipidemia: Secondary | ICD-10-CM | POA: Diagnosis not present

## 2023-08-26 DIAGNOSIS — Z1152 Encounter for screening for COVID-19: Secondary | ICD-10-CM | POA: Diagnosis not present

## 2023-08-26 DIAGNOSIS — K760 Fatty (change of) liver, not elsewhere classified: Secondary | ICD-10-CM | POA: Diagnosis present

## 2023-08-26 DIAGNOSIS — I4892 Unspecified atrial flutter: Secondary | ICD-10-CM

## 2023-08-26 DIAGNOSIS — Z7901 Long term (current) use of anticoagulants: Secondary | ICD-10-CM | POA: Diagnosis not present

## 2023-08-26 DIAGNOSIS — I130 Hypertensive heart and chronic kidney disease with heart failure and stage 1 through stage 4 chronic kidney disease, or unspecified chronic kidney disease: Secondary | ICD-10-CM | POA: Diagnosis present

## 2023-08-26 DIAGNOSIS — I71019 Dissection of thoracic aorta, unspecified: Secondary | ICD-10-CM | POA: Diagnosis present

## 2023-08-26 DIAGNOSIS — R0602 Shortness of breath: Secondary | ICD-10-CM | POA: Diagnosis not present

## 2023-08-26 DIAGNOSIS — I4891 Unspecified atrial fibrillation: Secondary | ICD-10-CM | POA: Diagnosis present

## 2023-08-26 DIAGNOSIS — Z5986 Financial insecurity: Secondary | ICD-10-CM | POA: Diagnosis not present

## 2023-08-26 DIAGNOSIS — I2510 Atherosclerotic heart disease of native coronary artery without angina pectoris: Secondary | ICD-10-CM | POA: Diagnosis present

## 2023-08-26 LAB — CBC
HCT: 34.7 % — ABNORMAL LOW (ref 39.0–52.0)
Hemoglobin: 11.6 g/dL — ABNORMAL LOW (ref 13.0–17.0)
MCH: 31.5 pg (ref 26.0–34.0)
MCHC: 33.4 g/dL (ref 30.0–36.0)
MCV: 94.3 fL (ref 80.0–100.0)
Platelets: 168 10*3/uL (ref 150–400)
RBC: 3.68 MIL/uL — ABNORMAL LOW (ref 4.22–5.81)
RDW: 14.7 % (ref 11.5–15.5)
WBC: 9.4 10*3/uL (ref 4.0–10.5)
nRBC: 0 % (ref 0.0–0.2)

## 2023-08-26 LAB — MAGNESIUM: Magnesium: 2 mg/dL (ref 1.7–2.4)

## 2023-08-26 LAB — BASIC METABOLIC PANEL
Anion gap: 4 — ABNORMAL LOW (ref 5–15)
BUN: 11 mg/dL (ref 6–20)
CO2: 24 mmol/L (ref 22–32)
Calcium: 8.1 mg/dL — ABNORMAL LOW (ref 8.9–10.3)
Chloride: 112 mmol/L — ABNORMAL HIGH (ref 98–111)
Creatinine, Ser: 0.9 mg/dL (ref 0.61–1.24)
GFR, Estimated: >60 mL/min (ref >60)
Glucose, Bld: 116 mg/dL — ABNORMAL HIGH (ref 70–99)
Potassium: 3.9 mmol/L (ref 3.5–5.1)
Sodium: 140 mmol/L (ref 135–145)

## 2023-08-26 LAB — PROTIME-INR
INR: 2.7 — ABNORMAL HIGH (ref 0.8–1.2)
Prothrombin Time: 28.8 s — ABNORMAL HIGH (ref 11.4–15.2)

## 2023-08-26 LAB — TROPONIN I (HIGH SENSITIVITY)
Troponin I (High Sensitivity): 27 ng/L — ABNORMAL HIGH (ref <18)
Troponin I (High Sensitivity): 25 ng/L — ABNORMAL HIGH (ref <18)

## 2023-08-26 MED ORDER — SODIUM CHLORIDE 0.9 % IV SOLN: INTRAVENOUS | Status: DC

## 2023-08-26 MED ORDER — MORPHINE SULFATE (PF) 2 MG/ML IV SOLN
2.0000 mg | INTRAVENOUS | Status: DC | PRN
  Administered 2023-08-26 (×2): 2 mg via INTRAVENOUS
  Filled 2023-08-26 (×2): qty 1

## 2023-08-26 MED ORDER — WARFARIN SODIUM 2.5 MG PO TABS
2.5000 mg | ORAL_TABLET | Freq: Once | ORAL | Status: AC
  Administered 2023-08-26: 2.5 mg via ORAL
  Filled 2023-08-26: qty 1

## 2023-08-26 MED ORDER — AMIODARONE HCL IN DEXTROSE 360-4.14 MG/200ML-% IV SOLN
60.0000 mg/h | INTRAVENOUS | Status: AC
  Administered 2023-08-26 (×2): 60 mg/h via INTRAVENOUS
  Filled 2023-08-26 (×2): qty 200

## 2023-08-26 MED ORDER — AMIODARONE LOAD VIA INFUSION
150.0000 mg | Freq: Once | INTRAVENOUS | Status: AC
  Administered 2023-08-26: 150 mg via INTRAVENOUS
  Filled 2023-08-26: qty 83.34

## 2023-08-26 MED ORDER — METOPROLOL TARTRATE 25 MG PO TABS
25.0000 mg | ORAL_TABLET | Freq: Two times a day (BID) | ORAL | Status: DC
  Administered 2023-08-26 – 2023-08-28 (×5): 25 mg via ORAL
  Filled 2023-08-26 (×5): qty 1

## 2023-08-26 MED ORDER — HYDROXYZINE HCL 10 MG PO TABS
10.0000 mg | ORAL_TABLET | Freq: Once | ORAL | Status: AC
  Administered 2023-08-26: 10 mg via ORAL
  Filled 2023-08-26: qty 1

## 2023-08-26 MED ORDER — GADOBUTROL 1 MMOL/ML IV SOLN
7.5000 mL | Freq: Once | INTRAVENOUS | Status: AC | PRN
  Administered 2023-08-26: 7.5 mL via INTRAVENOUS

## 2023-08-26 MED ORDER — GUAIFENESIN-DM 100-10 MG/5ML PO SYRP
15.0000 mL | ORAL_SOLUTION | ORAL | Status: DC | PRN
  Administered 2023-08-27: 15 mL via ORAL
  Filled 2023-08-26: qty 20

## 2023-08-26 MED ORDER — AMIODARONE HCL IN DEXTROSE 360-4.14 MG/200ML-% IV SOLN
30.0000 mg/h | INTRAVENOUS | Status: DC
  Administered 2023-08-26 – 2023-08-27 (×3): 30 mg/h via INTRAVENOUS
  Filled 2023-08-26 (×2): qty 200
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
## 2023-08-26 NOTE — Progress Notes (Signed)
 Progress Note   Patient: Ryan [name] YQI:[id number] DOB: [birth year] DOA: 08/25/2023     0 DOS: the patient was seen and examined on 08/26/2023   Brief hospital course: TYREE FLUHARTY is a 58 y.o. 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  Patient was found to have A-fib with RVR and initiated on diltiazem drip.  [facility] contacted for admission.  Cardiology is following   Assessment and Plan: * Atrial fibrillation with RVR (HCC) Patient presented with palpitations and chest pain found to be in atrial fibrillation with RVR.  Chest pain has resolved with improvement in rates.  Unclear trigger for RVR at this time, potentially due to hypokalemia. Continue telemetry monitoring Patient failed diltiazem and so transition to amiodarone drip Metoprolol added as well Continue to maintain magnesium and potassium within normal range Plan of care discussed with cardiologist Cardiology planning cardioversion hopefully tomorrow We will keep n.p.o. after midnight Continue home warfarin per pharmacy dosing   Chronic HFrEF (heart failure with reduced ejection fraction) (HCC) Patient has a history of HFmrEF with last EF of 40-45%, not currently requiring diuretic therapy.  He appears euvolemic on examination  Monitor daily weight Monitor input and output   Simple renal cyst MRI of the abdomen showing no renal lesions however showed multiple simple renal cysts Outpatient follow-up   Chronic thoracic aortic dissection (HCC) CT imaging demonstrates stability.   Continue outpatient follow-up with cardiothoracic surgery.   Essential hypertension - Hold home antihypertensives given need for rate control of A-fib   OSA (obstructive sleep apnea) Unable to  tolerate CPAP   Status post mechanical aortic valve replacement - Continue warfarin per pharmacy dosing   Advance Care Planning:   Code Status: Full Code    Consults: None   Family Communication: No family at bed side  Subjective:  Patient seen and examined at bedside this morning Still in A-fib with RVR Denies nausea vomiting abdominal pain chest pain cough  Physical Exam:  General: Middle-aged male laying in bed in no acute distress HENT:     Head: Normocephalic and atraumatic.     Mouth/Throat:     Mouth: Mucous membranes are moist.     Pharynx: Oropharynx is clear.  Cardiovascular: A-fib, S1 and S2 present Pulmonary:     Effort: Pulmonary effort is normal. No tachypnea or respiratory distress.     Breath sounds: No decreased breath sounds, wheezing, rhonchi or rales.  Abdominal:     General: There is no distension.     Palpations: Abdomen is soft.     Tenderness: There is no abdominal tenderness.  Musculoskeletal:     Right lower leg: No edema.     Left lower leg: No edema.  Skin:    General: Skin is warm and dry.  Neurological:     General: No focal deficit present.     Mental Status: He is alert and oriented to person, place, and time.  Psychiatric:        Mood and Affect: Mood normal.        Behavior: Behavior normal.   Vitals:   08/26/23 1400 08/26/23 1500 08/26/23 1515 08/26/23 1518  BP: 111/78 103/72    Pulse: (!) 47 71 72   Resp: 20 (!) 23 (!) 22   Temp:  98.2 F (36.8 C)  TempSrc:    Oral  SpO2: 99% 98% 97%   Weight:      Height:        Data Reviewed: I have reviewed patient's CT scan as well as MRI    Latest Ref Rng & Units 08/26/2023    6:00 AM 08/25/2023    3:19 AM 04/10/2023    5:14 PM  CBC  WBC 4.0 - 10.5 K/uL 9.4  6.3  5.4   Hemoglobin 13.0 - 17.0 g/dL 16.1  09.6  04.5   Hematocrit 39.0 - 52.0 % 34.7  35.7  37.6   Platelets 150 - 400 K/uL 168  152  173        Latest Ref Rng & Units 08/26/2023    6:00 AM 08/25/2023    3:19 AM  08/11/2023   11:16 AM  BMP  Glucose 70 - 99 mg/dL 409  811  914   BUN 6 - 20 mg/dL 11  13  9    Creatinine 0.61 - 1.24 mg/dL 7.82  9.56  2.13   BUN/Creat Ratio 9 - 20   8   Sodium 135 - 145 mmol/L 140  138  140   Potassium 3.5 - 5.1 mmol/L 3.9  3.0  4.6   Chloride 98 - 111 mmol/L 112  108  105   CO2 22 - 32 mmol/L 24  21  22    Calcium 8.9 - 10.3 mg/dL 8.1  8.2  8.7     Disposition: Status is: Inpatient  Time spent: 56 minutes  Author: Loyce Dys, MD 08/26/2023 5:54 PM  For on call review [email or web address].

## 2023-08-26 NOTE — Progress Notes (Signed)
       CROSS COVER NOTE  NAME: Ryan [name] MRN: [id number] DOB : 03-11-1966 ATTENDING PHYSICIAN: Verdene Lennert, MD    Date of Service   08/26/2023   HPI/Events of Note   Notified by nurse heart rate dipping below 60 Patient admitted a fib with RVR. History of paroxysmal a fib   Interventions   Assessment/Plan:    08/26/2023    3:29 AM 08/26/2023    2:00 AM 08/26/2023    1:00 AM  Vitals with BMI  Systolic 106 104 96  Diastolic 71 70 64  Pulse 66 76 76   EKG atrial flutter with variable conduction block. V rate 51  Leave cardizem infusion off Consult cardiology in am - sees Dr End outpatient Continue warfarin - INR therapeutic at 2.6         Donnie Mesa NP [facility] Cross Cover 7pm-7am - check amion for availability Pager [phone]

## 2023-08-26 NOTE — ED Notes (Signed)
 Patient transported to MRI

## 2023-08-26 NOTE — Consult Note (Signed)
 PHARMACY - ANTICOAGULATION CONSULT NOTE  Pharmacy Consult for Warfarin management Indication: Afib and mechanical AVR  Allergies  Allergen Reactions   Alitraq Rash   Iodinated Contrast Media Rash   Iodine Rash    CT dye CT dye  CT dye CT dye    Patient Measurements: Height: 5\' 6"  (167.6 cm) Weight: 79.4 kg (175 lb) IBW/kg (Calculated) : 63.8   Vital Signs: Temp: 97.5 F (36.4 C) (03/13 0726) Temp Source: Oral (03/13 0726) BP: 113/73 (03/13 0726) Pulse Rate: 76 (03/13 0726)  Labs: Recent Labs    08/25/23 0319 08/25/23 0527 08/26/23 0600  HGB 12.2*  --  11.6*  HCT 35.7*  --  34.7*  PLT 152  --  168  LABPROT 28.0*  --  28.8*  INR 2.6*  --  2.7*  CREATININE 0.90  --  0.90  TROPONINIHS 34* 38*  --     Estimated Creatinine Clearance: 90.7 mL/min (by C-G formula based on SCr of 0.9 mg/dL).   Medical History: Past Medical History:  Diagnosis Date   Acute cholecystitis 12/25/2021   Acute cystitis with hematuria 11/20/2020   Last Assessment & Plan:   Formatting of this note might be different from the original.  Patient completed a course of Keflex. Symptoms resolved. Denies dysuria, frequency, urgency, fevers, chills, abdominal pain or cramps.   Anemia    Aortic aneurysm and dissection (HCC)    a. 2001 s/p grafting and AVR; b. 09/2021 CTA Chest: Dil Ao root up to 51mm. Ao arch 46mm. Desc Ao 54mm. Complicated arch dissection w/ mult false and/or partially thrombosed lumens. Dissection flaps extend into all great vessels except L carotid. Dissection cont into R CCA and into abd->L RA.   Chronic anticoagulation    Chronic cholecystitis 01/31/2022   Dissecting aortic aneurysm, thoracoabdominal (HCC)    Fatty liver    GERD (gastroesophageal reflux disease)    H/O mechanical aortic valve replacement    a. 2001 s/p AVR in setting of Asc Ao aneurysm repair; b. 08/2021 Echo: EF 50-55%, no rwma, mild LVH, GrII DD, nl RV fxn, mild MR, triv AI w/ mean AoV grad .   Heart  murmur    History of cardiac catheterization    a. 08/2019 MV: basal-dist lateral and inflat defect; b. 07/2020 Cath: nl cors.   Hyperlipidemia    Hypertension    Hypolipidemia    Iron deficiency    PAF (paroxysmal atrial fibrillation) (HCC)    a. CHA2DS2VASc = 3.  Chronic warfarin (also mech AVR).   Pulmonary nodule    Sleep apnea    Stroke [facility])     Medications:  Per last Anti-coag note from 08/04/23 patient is currently on Warfarin 5 mg Mon/Fri and 2.5 mg AOD  Assessment: 58 yo male presented to ED with chest pain and palpitations, found to be in Afib.  Patient has PMH of mechanical AVR, Afib on Warfarin, HFpEF, MI, HTN, HLD, and CKD.  Pharmacy consulted to manage warfarin.  Goal of Therapy:  INR 2.5-3 per anticoag visit note 08/04/23  Monitor platelets by anticoagulation protocol: Yes   Plan:  INR is therapeutic at 2.7 Will give warfarin 2.5 mg po x 1 today according to home regimen Monitor CBC at least every 72 hours  Barrie Folk, PharmD 08/26/2023,9:45 AM

## 2023-08-27 ENCOUNTER — Inpatient Hospital Stay: Admitting: Anesthesiology

## 2023-08-27 ENCOUNTER — Inpatient Hospital Stay

## 2023-08-27 ENCOUNTER — Encounter
Admission: EM | Disposition: A | Discharge status: Home / Self Care | Payer: Self-pay | Source: Home / Self Care | Attending: Internal Medicine

## 2023-08-27 DIAGNOSIS — I4891 Unspecified atrial fibrillation: Secondary | ICD-10-CM | POA: Diagnosis not present

## 2023-08-27 DIAGNOSIS — R0602 Shortness of breath: Secondary | ICD-10-CM

## 2023-08-27 HISTORY — PX: CARDIOVERSION: SHX1299

## 2023-08-27 LAB — CBC WITH DIFFERENTIAL/PLATELET
Abs Immature Granulocytes: 0.06 10*3/uL (ref 0.00–0.07)
Basophils Absolute: 0 10*3/uL (ref 0.0–0.1)
Basophils Relative: 0 %
Eosinophils Absolute: 0.1 10*3/uL (ref 0.0–0.5)
Eosinophils Relative: 1 %
HCT: 36.5 % — ABNORMAL LOW (ref 39.0–52.0)
Hemoglobin: 12.7 g/dL — ABNORMAL LOW (ref 13.0–17.0)
Immature Granulocytes: 1 %
Lymphocytes Relative: 11 %
Lymphs Abs: 1.2 10*3/uL (ref 0.7–4.0)
MCH: 32.4 pg (ref 26.0–34.0)
MCHC: 34.8 g/dL (ref 30.0–36.0)
MCV: 93.1 fL (ref 80.0–100.0)
Monocytes Absolute: 0.7 10*3/uL (ref 0.1–1.0)
Monocytes Relative: 6 %
Neutro Abs: 9 10*3/uL — ABNORMAL HIGH (ref 1.7–7.7)
Neutrophils Relative %: 81 %
Platelets: 153 10*3/uL (ref 150–400)
RBC: 3.92 MIL/uL — ABNORMAL LOW (ref 4.22–5.81)
RDW: 15.1 % (ref 11.5–15.5)
WBC: 11 10*3/uL — ABNORMAL HIGH (ref 4.0–10.5)
nRBC: 0.5 % — ABNORMAL HIGH (ref 0.0–0.2)

## 2023-08-27 LAB — BASIC METABOLIC PANEL
Anion gap: 9 (ref 5–15)
BUN: 16 mg/dL (ref 6–20)
CO2: 22 mmol/L (ref 22–32)
Calcium: 8.3 mg/dL — ABNORMAL LOW (ref 8.9–10.3)
Chloride: 106 mmol/L (ref 98–111)
Creatinine, Ser: 0.94 mg/dL (ref 0.61–1.24)
GFR, Estimated: >60 mL/min (ref >60)
Glucose, Bld: 118 mg/dL — ABNORMAL HIGH (ref 70–99)
Potassium: 3.6 mmol/L (ref 3.5–5.1)
Sodium: 137 mmol/L (ref 135–145)

## 2023-08-27 LAB — RESP PANEL BY RT-PCR (RSV, FLU A&B, COVID)  RVPGX2
Influenza A by PCR: NEGATIVE
Influenza B by PCR: NEGATIVE
Resp Syncytial Virus by PCR: NEGATIVE
SARS Coronavirus 2 by RT PCR: NEGATIVE

## 2023-08-27 LAB — PROTIME-INR
INR: 2.6 — ABNORMAL HIGH (ref 0.8–1.2)
Prothrombin Time: 28.2 s — ABNORMAL HIGH (ref 11.4–15.2)

## 2023-08-27 SURGERY — CARDIOVERSION
Anesthesia: General

## 2023-08-27 MED ORDER — PROPOFOL 10 MG/ML IV BOLUS
INTRAVENOUS | Status: DC | PRN
  Administered 2023-08-27: 80 mg via INTRAVENOUS

## 2023-08-27 MED ORDER — PANTOPRAZOLE SODIUM 40 MG PO TBEC
40.0000 mg | DELAYED_RELEASE_TABLET | ORAL | Status: AC
Start: 2023-08-27 — End: 2023-08-27
  Administered 2023-08-27: 40 mg via ORAL
  Filled 2023-08-27: qty 1

## 2023-08-27 MED ORDER — WARFARIN SODIUM 5 MG PO TABS
5.0000 mg | ORAL_TABLET | ORAL | Status: DC
Start: 2023-08-27 — End: 2023-08-28
  Administered 2023-08-27: 5 mg via ORAL
  Filled 2023-08-27: qty 1

## 2023-08-27 MED ORDER — WARFARIN SODIUM 2.5 MG PO TABS: 2.5000 mg | ORAL_TABLET | ORAL | Status: DC

## 2023-08-27 NOTE — Consult Note (Signed)
 PHARMACY - ANTICOAGULATION CONSULT NOTE  Pharmacy Consult for Warfarin management Indication: Afib and mechanical AVR  Patient Measurements: Height: 5\' 6"  (167.6 cm) Weight: 76.6 kg (168 lb 14.4 oz) IBW/kg (Calculated) : 63.8  Labs: Recent Labs    08/25/23 0319 08/25/23 0527 08/26/23 0600 08/26/23 1325 08/26/23 1534 08/27/23 0546 08/27/23 0733  HGB 12.2*  --  11.6*  --   --  12.7*  --   HCT 35.7*  --  34.7*  --   --  36.5*  --   PLT 152  --  168  --   --  153  --   LABPROT 28.0*  --  28.8*  --   --  28.2*  --   INR 2.6*  --  2.7*  --   --  2.6*  --   CREATININE 0.90  --  0.90  --   --   --  0.94  TROPONINIHS 34* 38*  --  27* 25*  --   --     Estimated Creatinine Clearance: 85.5 mL/min (by C-G formula based on SCr of 0.94 mg/dL).  Medications:  Per last Anti-coag note from 08/04/23 patient is currently on Warfarin 5 mg Mon/Fri and 2.5 mg AOD  Assessment: 58 yo male presented to ED with chest pain and palpitations, found to be in Afib.  Patient has PMH of mechanical AVR, Afib on Warfarin, HFpEF, MI, HTN, HLD, and CKD.  Pharmacy consulted to manage warfarin.  Date INR Warfarin Comments  3/12 2.6 2.5 mg Admitted with chest pain  3/13 2.7 2.5 mg   3/14 2.6  Resume PTA warfarin dose   Goal of Therapy:  INR 2.5-3 per anticoag visit note 08/04/23  Monitor platelets by anticoagulation protocol: Yes   Plan:  INR remains therapeutic. Will resume warfarin PTA dose of 2.5mg  daily except for 5mg  every Mon & Fri. Monitor CBC at least every 72 hours  Leshea Jaggers Rodriguez-Guzman PharmD, BCPS 08/27/2023 9:18 AM

## 2023-08-27 NOTE — Progress Notes (Signed)
       CROSS COVER NOTE  NAME: Ryan [name] MRN: [id number] DOB : October 05, 1965 ATTENDING PHYSICIAN: Loyce Dys, MD    Date of Service   08/27/2023   HPI/Events of Note   Nurse reports patient complaining of increased shortness of breath, placed on 2 L [place] and questions regarding coninuation of amiodarone infusion Admitted a fib RVR underwnt DCCV today- on warfarin HF mfEF  Interventions   Assessment/Plan:    08/27/2023    7:34 PM 08/27/2023    3:16 PM 08/27/2023    2:20 PM  Vitals with BMI  Systolic 118 110 557  Diastolic 83 70 73  Pulse 71 68 71   Oral temp 98.2, just drank cold beveral Reports cough shortness of breath on Thursday Most recently this evening patient said extreme shortnes of breath experienced with ambulation to bathroom Also reports he feels like he has a fever Patient sitting edge of his bed, some work of breathing noted note severe distress. Endorses cough that started on Thursday as well is nonproductive Systolic murmur and click at S1  previously documented appreciated on exam BBS essentially clear Reports stomach soreness from the cough but denies chest pain Last chest xray +vascular congestion; BNP 245 on admission EKGSR rate 75 LBBB (no new) Qtc 450   Hold amiodarone for heart rate i 60's Furosemide 20 x1 dose Repeat bnp in am Electrolytes goal K above 4, Mag above 2         Donnie Mesa NP [facility] Cross Cover 7pm-7am - check amion for availability Pager [phone]

## 2023-08-27 NOTE — CV Procedure (Signed)
 Cardioversion procedure note For atrial fibrillation/flutter, persistent with RVR.  Procedure Details:  Consent: Risks of procedure as well as the alternatives and risks of each were explained to the (patient/caregiver).  Consent for procedure obtained.  Time Out: Verified patient identification, verified procedure, site/side was marked, verified correct patient position, special equipment/implants available, medications/allergies/relevent history reviewed, required imaging and test results available.  Performed  Patient placed on cardiac monitor, pulse oximetry, supplemental oxygen as necessary.   Sedation given: propofol IV, Dr. Lorette Ang Pacer pads placed anterior and posterior chest.   Cardioverted 1 time(s).   Cardioverted at  150 J. Synchronized biphasic Converted to NSR   Evaluation: Findings: Post procedure EKG shows: NSR Complications: None Patient did tolerate procedure well.  Time Spent Directly with the Patient:  45 minutes   Ryan [name], M.D., Ph.D.

## 2023-08-27 NOTE — Transfer of Care (Signed)
 Immediate Anesthesia Transfer of Care Note  Patient: Ryan [name]  Procedure(s) Performed: CARDIOVERSION  Patient Location: PACU  Anesthesia Type:General  Level of Consciousness: drowsy and patient cooperative  Airway & Oxygen Therapy: Patient Spontanous Breathing and Patient connected to nasal cannula oxygen  Post-op Assessment: Report given to RN and Post -op Vital signs reviewed and stable  Post vital signs: Reviewed and stable  Last Vitals:  Vitals Value Taken Time  BP 92/63 08/27/23 1307  Temp    Pulse 67 08/27/23 1305  Resp 30 08/27/23 1307  SpO2 91 % 08/27/23 1307    Last Pain:  Vitals:   08/27/23 1223  TempSrc: Oral  PainSc: 5       Patients Stated Pain Goal: 0 (08/27/23 1223)  Complications: No notable events documented.

## 2023-08-27 NOTE — Progress Notes (Signed)
 Progress Note  Patient Name: Ryan [name] Date of Encounter: 08/27/2023  Primary Cardiologist: End  Subjective   Remains in atrial flutter/fib with ventricular rates improved into the 70s to low 100s bpm intermittently. Continues to note intermittent palpitations and mild chest discomfort. BP stable. INR 2.6. NPO for DCCV today.   Inpatient Medications    Scheduled Meds:  aspirin EC  81 mg Oral Daily   ferrous sulfate  650 mg Oral Daily   metoprolol tartrate  25 mg Oral BID   pravastatin  20 mg Oral q1800   Warfarin - Pharmacist Dosing Inpatient   Does not apply q1600   Continuous Infusions:  sodium chloride 20 mL/hr at 08/26/23 2305   amiodarone 30 mg/hr (08/27/23 0056)   PRN Meds: acetaminophen, guaiFENesin-dextromethorphan, morphine injection, ondansetron (ZOFRAN) IV   Vital Signs    Vitals:   08/26/23 1817 08/26/23 1817 08/26/23 1952 08/27/23 0519  BP:  136/70 131/85   Pulse: 94 94    Resp: (!) 24 20 20    Temp:   97.6 F (36.4 C)   TempSrc:      SpO2: 100% 100% 93%   Weight:    76.6 kg  Height:   5\' 6"  (1.676 m)     Intake/Output Summary (Last 24 hours) at 08/27/2023 0849 Last data filed at 08/27/2023 0537 Gross per 24 hour  Intake 483.53 ml  Output 770 ml  Net -286.47 ml   Filed Weights   08/25/23 0311 08/27/23 0519  Weight: 79.4 kg 76.6 kg    Telemetry    Afib/flutter with ventricular rates in the 70s to low 100s bpm, BBB - Personally Reviewed  ECG    No new tracings - Personally Reviewed  Physical Exam   GEN: No acute distress.   Neck: No JVD. Cardiac: Irregular, II/VI systolic murmur RUSB, no rubs, or gallops.  Respiratory: Clear to auscultation bilaterally.  GI: Soft, nontender, non-distended.   MS: No edema; No deformity. Neuro:  Alert and oriented x 3; Nonfocal.  Psych: Normal affect.  Labs    Chemistry Recent Labs  Lab 08/25/23 0319 08/26/23 0600 08/27/23 0733  NA 138 140 137  K 3.0* 3.9 3.6  CL 108 112* 106  CO2 21* 24 22   GLUCOSE 125* 116* 118*  BUN 13 11 16   CREATININE 0.90 0.90 0.94  CALCIUM 8.2* 8.1* 8.3*  PROT 6.1*  --   --   ALBUMIN 3.5  --   --   AST 21  --   --   ALT 14  --   --   ALKPHOS 49  --   --   BILITOT 0.8  --   --   GFRNONAA >60 >60 >60  ANIONGAP 9 4* 9     Hematology Recent Labs  Lab 08/25/23 0319 08/26/23 0600 08/27/23 0546  WBC 6.3 9.4 11.0*  RBC 3.74* 3.68* 3.92*  HGB 12.2* 11.6* 12.7*  HCT 35.7* 34.7* 36.5*  MCV 95.5 94.3 93.1  MCH 32.6 31.5 32.4  MCHC 34.2 33.4 34.8  RDW 14.7 14.7 15.1  PLT 152 168 153    Cardiac EnzymesNo results for input(s): "TROPONINI" in the last 168 hours. No results for input(s): "TROPIPOC" in the last 168 hours.   BNP Recent Labs  Lab 08/25/23 0319  BNP 213.9*     DDimer No results for input(s): "DDIMER" in the last 168 hours.   Radiology    MR ABDOMEN W WO CONTRAST Result Date: 08/26/2023 IMPRESSION: 1. No suspicious  renal lesion. The lesions described on the prior CT scan corresponds to simple cysts. There are additional multiple scattered simple cysts throughout bilateral kidneys with largest in the right kidney upper pole measuring up to 1.3 x 1.4 cm. 2. Multiple other nonacute observations, as described above. Electronically Signed   By: Jules Schick M.D.   On: 08/26/2023 08:57    Cardiac Studies   Cath/PCI: LHC (07/22/2020, [facility]): LMCA normal.  LAD normal.  LCx normal.  Dominant RCA normal.   CV Surgery: Aortic root repair and mechanical AVR (2001)   EP Procedures and Devices: None   Non-Invasive Evaluation(s): Coronary CTA (10/23/2022): Score 159 which was the 85th percentile, minimal CAD with less than 25% stenosis in the LAD, LCx, and RCA.  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 TTE (10/12/2022): EF of  40 to 45%, moderate LVH, indeterminate LV diastolic function parameters, normal RV systolic function and ventricular cavity size, moderately dilated left atrium, mildly dilated right atrium, mild mitral valve regurgitation, prior replacement of the aortic valve with trivial regurgitation and no evidence of stenosis with an aortic valve mean gradient of 9.5 mmHg. Pharmacologic MPI (12/12/2021): Abnormal, potentially high risk pharmacologic myocardial perfusion stress test.  There is a large, fixed defect involving the inferior and inferolateral walls most consistent with scar but cannot rule out an element of artifact.  No significant ischemia identified.  LVEF 50% by Siemens calculation.  Transient ischemic dilation noted (TID1.35). CTA chest (09/28/2021): Complex aortic dissection with aortic valve replacement.  Dilated aortic root measuring up to 5.1 cm, aortic arch measuring up to 4.6 cm and descending aorta measuring up to 5.4 cm.  Dissections extend into all great vessels except for the left common carotid artery.  Duplicated SVC noted. TTE (08/27/2021): Normal LV size with mild LVH.  LVEF 50-55% with grade 2 diastolic dysfunction.  Mildly dilated RV with normal contraction.  Mild right atrial enlargement.  Mild mitral regurgitation.  Mechanical aortic valve present with mean gradient 7 mmHg.  Normal CVP. CTA chest, abdomen, and pelvis (04/25/2021, [facility]): Patient status post AVR and ascending aortic graft.  Residual dissection in the arch to the level of the bilateral common iliac arteries.  Aortic root measures up to 5.0 cm.  Arch and descending aorta measure up to 4.8 cm.  Findings are stable from prior study in 01/2021. TTE (02/05/2021, [facility]): Technically difficult study.  Mildly dilated left ventricle with LVEF 55-60% with abnormal septal motion.  Moderate left atrial enlargement.  Normal mechanical AVR gradient (mean gradient 7 mmHg) with mild regurgitation.  Patient Profile     58  y.o. 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 Meriam Sprague.   Assessment & Plan    1. PAF/flutter: -Remains in A-fib/flutter with improving ventricular rates ranging from the 70s to the low 100s bpm  -Diltiazem drip discontinued in the  setting of relative hypotension -Adequately anticoagulated with warfarin -Continue amiodarone drip for now and newly started Lopressor 25 mg twice daily  -However, amiodarone is not a good option long-term given his age -NPO for DCCV today -Will need to likely de-escalate pharmacotherapy following DCCV given underlying baseline bradycardic rates in sinus rhythm -Maintain potassium at goal 4.0 -Would recommend outpatient EP evaluation    2. HFmrEF secondary to NICM: -Appears euvolemic -PTA lisinopril on hold with relative hypotension -Has been initiated on metoprolol as outlined above, will need to monitor heart rates if he converts to sinus rhythm given underlying baseline bradycardic rates in sinus rhythm -Not requiring a standing loop diuretic   3.  Aortic dissection status post repair and mechanical aortic valve replacement -Stable findings on imaging this admission -Remains on indefinite anticoagulation with warfarin -SBE prophylaxis for all dental procedures -Followed by CVTS as outpatient   4.  Nonobstructive CAD with elevated high-sensitivity troponin/HLD: -Mildly elevated and flat trending high-sensitivity troponin, not consistent with  ACS -Suspect elevated troponin is reflective of supply/demand ischemia in the setting of A-fib/flutter with RVR -Recent coronary CTA showed minimal nonobstructive CAD -Currently on aspirin and pravastatin -No plans for inpatient ischemic evaluation at this time   Informed Consent   Shared Decision Making/Informed Consent{  The risks (stroke, cardiac arrhythmias rarely resulting in the need for a temporary or permanent pacemaker, skin irritation or burns and complications associated with conscious sedation including aspiration, arrhythmia, respiratory failure and death), benefits (restoration of normal sinus rhythm) and alternatives of a direct current cardioversion were explained in detail to Mr. Aultman and he agrees to proceed.        For questions or updates, please contact [facility] Please consult [email or web address] for contact info under Cardiology/STEMI.    Signed, Eula Listen, PA-C [facility] Pager: [phone] 08/27/2023, 8:49 AM

## 2023-08-27 NOTE — Plan of Care (Signed)
  Problem: Education: Goal: Knowledge of General Education information will improve Description: Including pain rating scale, medication(s)/side effects and non-pharmacologic comfort measures Outcome: Progressing   Problem: Clinical Measurements: Goal: Will remain free from infection Outcome: Progressing   Problem: Clinical Measurements: Goal: Respiratory complications will improve Outcome: Progressing   Problem: Clinical Measurements: Goal: Cardiovascular complication will be avoided Outcome: Progressing   Problem: Activity: Goal: Risk for activity intolerance will decrease Outcome: Progressing   Problem: Nutrition: Goal: Adequate nutrition will be maintained Outcome: Progressing   Problem: Pain Managment: Goal: General experience of comfort will improve and/or be controlled Outcome: Progressing   Problem: Safety: Goal: Ability to remain free from injury will improve Outcome: Progressing

## 2023-08-27 NOTE — Progress Notes (Signed)
 Transition of Care [facility]) - Inpatient Brief Assessment   Patient Details  Name: Ryan [name] MRN: [id number] Date of Birth: 02/01/1966  Transition of [facility] CM/SW Contact:    Truddie Hidden, RN Phone Number: 08/27/2023, 4:06 PM   Clinical Narrative: TOC continuing to follow patient's progress throughout discharge planning.   Transition of Care Asessment: Insurance and Status: Insurance coverage has been reviewed Patient has primary care physician: Yes   Prior level of function:: Independent Prior/Current Home Services: No current home services Social Drivers of Health Review: SDOH reviewed no interventions necessary Readmission risk has been reviewed: Yes Transition of care needs: no transition of care needs at this time

## 2023-08-27 NOTE — Anesthesia Postprocedure Evaluation (Signed)
 Anesthesia Post Note  Patient: Ryan [name]  Procedure(s) Performed: CARDIOVERSION  Patient location during evaluation: Specials Recovery Anesthesia Type: General Level of consciousness: awake and alert Pain management: pain level controlled Vital Signs Assessment: post-procedure vital signs reviewed and stable Respiratory status: spontaneous breathing, nonlabored ventilation, respiratory function stable and patient connected to nasal cannula oxygen Cardiovascular status: blood pressure returned to baseline and stable Postop Assessment: no apparent nausea or vomiting Anesthetic complications: no  No notable events documented.   Last Vitals:  Vitals:   08/27/23 1305 08/27/23 1307  BP:  92/63  Pulse: 67 63  Resp: (!) 27 (!) 30  Temp:    SpO2: (!) 87% 91%    Last Pain:  Vitals:   08/27/23 1223  TempSrc: Oral  PainSc: 5                  Stephanie Coup

## 2023-08-27 NOTE — Anesthesia Preprocedure Evaluation (Addendum)
 Anesthesia Evaluation  Patient identified by MRN, date of birth, ID band Patient awake    Reviewed: Allergy & Precautions, NPO status , Patient's Chart, lab work & pertinent test results  Airway Mallampati: III  TM Distance: >3 FB Neck ROM: full    Dental  (+) Chipped   Pulmonary neg pulmonary ROS, sleep apnea    Pulmonary exam normal        Cardiovascular hypertension, + Past MI, + Peripheral Vascular Disease (hx aortic aneurysm and dissection s/p repair) and +CHF (EF 40-45%)  negative cardio ROS Normal cardiovascular exam+ dysrhythmias (a fib) + Valvular Problems/Murmurs (hx AVR)  Rhythm:Irregular  Echo 10/12/22: EF of 40 to 45%, moderate LVH, indeterminate LV diastolic function parameters, normal RV systolic function and ventricular cavity size, moderately dilated left atrium, mildly dilated right atrium, mild mitral valve regurgitation, prior replacement of the aortic valve with trivial regurgitation and no evidence of stenosis with an aortic valve mean gradient of 9.5 mmHg.  Myocardial perfusion 12/12/21:    Abnormal, potentially high risk pharmacologic myocardial perfusion stress test.   There is a large, fixed defect involving the inferior and inferolateral walls most consistent with scar but cannot rule out an element of artifact.   There is no evidence of significant ischemia.   Left ventricular systolic function is mildly to moderately reduced with basal and mid inferior hypokinesis (LVEF 37% by QGS, 50% by Siemens calculation).   Attenuation correction CT demonstates sequelae of aortic valve replacement and ascending aorta repair.  There is no significant coronary artery calcification.   Incidental note is made of aortic atherosclarosis and hepatic steatosis.   Transient ischemic dilation is noted (TID 1.35).  While non-specific, this can be seen with balanced ischemia.    Neuro/Psych  PSYCHIATRIC DISORDERS Anxiety  Depression    TIACVA negative neurological ROS     GI/Hepatic negative GI ROS, Neg liver ROS,GERD  ,,Fatty liver   Endo/Other  negative endocrine ROS    Renal/GU Renal disease (stage II CKD)negative Renal ROS  negative genitourinary   Musculoskeletal   Abdominal   Peds  Hematology negative hematology ROS (+) Blood dyscrasia, anemia   Anesthesia Other Findings Echo from 4/24:   IMPRESSIONS     1. Left ventricular ejection fraction, by estimation, is 40 to 45%. The  left ventricle has mildly decreased function. Left ventricular endocardial  border not optimally defined to evaluate regional wall motion. There is  moderate left ventricular  hypertrophy. Left ventricular diastolic parameters are indeterminate.   2. Right ventricular systolic function is normal. The right ventricular  size is normal.   3. Left atrial size was moderately dilated.   4. Right atrial size was mildly dilated.   5. The mitral valve is normal in structure. Mild mitral valve  regurgitation. No evidence of mitral stenosis.   6. The aortic valve has been repaired/replaced. Aortic valve  regurgitation is trivial. No aortic stenosis is present. Echo findings are  consistent with normal structure and function of the aortic valve  prosthesis. Aortic valve mean gradient measures 9.5   mmHg.   7. Technically difficult study due to poor echo windows and suboptimal  parasternal window.    Past Medical History: 12/25/2021: Acute cholecystitis 11/20/2020: Acute cystitis with hematuria     Comment:  Last Assessment & Plan:   Formatting of this note might               be different from the original.  Patient completed a  course of Keflex. Symptoms resolved. Denies dysuria,               frequency, urgency, fevers, chills, abdominal pain or               cramps. No date: Anemia No date: Aortic aneurysm and dissection (HCC)     Comment:  a. 2001 s/p grafting and AVR; b. 09/2021 CTA Chest: Dil                Ao root up to 51mm. Ao arch 46mm. Desc Ao 54mm.               Complicated arch dissection w/ mult false and/or               partially thrombosed lumens. Dissection flaps extend into              all great vessels except L carotid. Dissection cont into               R CCA and into abd->L RA. No date: Chronic anticoagulation 01/31/2022: Chronic cholecystitis No date: Dissecting aortic aneurysm, thoracoabdominal (HCC) No date: Fatty liver No date: GERD (gastroesophageal reflux disease) No date: H/O mechanical aortic valve replacement     Comment:  a. 2001 s/p AVR in setting of Asc Ao aneurysm repair; b.              08/2021 Echo: EF 50-55%, no rwma, mild LVH, GrII DD, nl RV              fxn, mild MR, triv AI w/ mean AoV grad . No date: Heart murmur No date: History of cardiac catheterization     Comment:  a. 08/2019 MV: basal-dist lateral and inflat defect; b.               07/2020 Cath: nl cors. No date: Hyperlipidemia No date: Hypertension No date: Hypolipidemia No date: Iron deficiency No date: PAF (paroxysmal atrial fibrillation) (HCC)     Comment:  a. CHA2DS2VASc = 3.  Chronic warfarin (also mech AVR). No date: Pulmonary nodule No date: Sleep apnea No date: Stroke [facility])  Past Surgical History: No date: ABDOMINAL SURGERY 2001: AORTIC VALVE REPLACEMENT     Comment:  [employer] Mechanical No date: ARTHROSCOPIC REPAIR ACL 02/01/2022: CHOLECYSTECTOMY 2019: COLONOSCOPY 12/26/2021: IR PERC CHOLECYSTOSTOMY No date: MENISCUS REPAIR  BMI    Body Mass Index: 27.26 kg/m      Reproductive/Obstetrics negative OB ROS                             Anesthesia Physical Anesthesia Plan  ASA: 3  Anesthesia Plan: General   Post-op Pain Management: Minimal or no pain anticipated   Induction: Intravenous  PONV Risk Score and Plan: 3 and Propofol infusion and TIVA  Airway Management Planned: Nasal Cannula  Additional Equipment: None  Intra-op  Plan:   Post-operative Plan:   Informed Consent: I have reviewed the patients History and Physical, chart, labs and discussed the procedure including the risks, benefits and alternatives for the proposed anesthesia with the patient or authorized representative who has indicated his/her understanding and acceptance.     Dental advisory given  Plan Discussed with: CRNA and Surgeon  Anesthesia Plan Comments: (Discussed risks of anesthesia with patient, including possibility of difficulty with spontaneous ventilation under anesthesia necessitating airway intervention, PONV, and rare risks such as cardiac or respiratory or neurological events, and allergic reactions.  Discussed the role of CRNA in patient's perioperative care. Patient understands.)        Anesthesia Quick Evaluation

## 2023-08-27 NOTE — Progress Notes (Addendum)
 Progress Note   Patient: Ryan [name] DOB: [birth year] DOA: 08/25/2023     1 DOS: the patient was seen and examined on 08/27/2023     Brief hospital course: Ryan [name] is a 58 y.o. 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  Patient was found to have A-fib with RVR and initiated on diltiazem drip.  [facility] contacted for admission.  Cardiology is following     Assessment and Plan: * Atrial fibrillation with RVR (HCC) Patient presented with palpitations and chest pain found to be in atrial fibrillation with RVR.  Chest pain has resolved with improvement in rates.  Unclear trigger for RVR at this time, potentially due to hypokalemia. Continue telemetry monitoring Currently on amiodarone drip Continue to maintain magnesium and potassium within normal range Plan of care discussed with cardiologist today Patient underwent successful DCCV on 08/27/2023 Continue home warfarin per pharmacy dosing   Chronic HFrEF (heart failure with reduced ejection fraction) (HCC) Patient has a history of HFmrEF with last EF of 40-45%, not currently requiring diuretic therapy.  He appears euvolemic on examination  Continue to monitor daily weight Continue to monitor input and output   Simple renal cyst MRI of the abdomen showing no renal lesions however showed multiple simple renal cysts Outpatient follow-up   Chronic thoracic aortic dissection (HCC) CT imaging demonstrates stability.   Continue outpatient follow-up with cardiothoracic surgery.   Essential hypertension - Hold home antihypertensives given need for rate control of A-fib   OSA (obstructive sleep apnea) Unable to tolerate CPAP   Status post mechanical aortic valve replacement -  Continue warfarin per pharmacy dosing   Advance Care Planning:   Code Status: Full Code    Consults: None   Family Communication: No family at bed side   Subjective:  Patient seen and examined at bedside this morning Denies nausea vomiting abdominal pain or chest pain Later today patient was taken for DCCV with successful conversion to sinus rhythm   Physical Exam:   General: Middle-aged male laying in bed in no acute distress HENT:     Head: Normocephalic and atraumatic.     Mouth/Throat:     Mouth: Mucous membranes are moist.     Pharynx: Oropharynx is clear.  Cardiovascular: A-fib, S1 and S2 present Pulmonary:     Effort: Pulmonary effort is normal. No tachypnea or respiratory distress.     Breath sounds: No decreased breath sounds, wheezing, rhonchi or rales.  Abdominal:     General: There is no distension.     Palpations: Abdomen is soft.     Tenderness: There is no abdominal tenderness.  Musculoskeletal:     Right lower leg: No edema.     Left lower leg: No edema.  Skin:    General: Skin is warm and dry.  Neurological:     General: No focal deficit present.     Mental Status: He is alert and oriented to person, place, and time.  Psychiatric:        Mood and Affect: Mood normal.        Behavior: Behavior normal.    Disposition: Status is: Inpatient   Data reviewed:    Latest Ref Rng & Units 08/27/2023    5:46 AM 08/26/2023    6:00 AM  08/25/2023    3:19 AM  CBC  WBC 4.0 - 10.5 K/uL 11.0  9.4  6.3   Hemoglobin 13.0 - 17.0 g/dL 16.1  09.6  04.5   Hematocrit 39.0 - 52.0 % 36.5  34.7  35.7   Platelets 150 - 400 K/uL 153  168  152        Latest Ref Rng & Units 08/27/2023    7:33 AM 08/26/2023    6:00 AM 08/25/2023    3:19 AM  BMP  Glucose 70 - 99 mg/dL 409  811  914   BUN 6 - 20 mg/dL 16  11  13    Creatinine 0.61 - 1.24 mg/dL 7.82  9.56  2.13   Sodium 135 - 145 mmol/L 137  140  138   Potassium 3.5 - 5.1 mmol/L 3.6  3.9  3.0   Chloride 98 - 111 mmol/L 106   112  108   CO2 22 - 32 mmol/L 22  24  21    Calcium 8.9 - 10.3 mg/dL 8.3  8.1  8.2      Vitals:   08/27/23 1304 08/27/23 1305 08/27/23 1307 08/27/23 1420  BP:   92/63 122/73  Pulse: 65 67 63 71  Resp: (!) 24 (!) 27 (!) 30 20  Temp:    (!) 97.4 F (36.3 C)  TempSrc:    Oral  SpO2: 91% (!) 87% 91% 93%  Weight:      Height:         Time spent: 45 minutes  Author: Loyce Dys, MD 08/27/2023 2:50 PM  For on call review [email or web address].

## 2023-08-28 DIAGNOSIS — I4891 Unspecified atrial fibrillation: Secondary | ICD-10-CM | POA: Diagnosis not present

## 2023-08-28 LAB — RESPIRATORY PANEL BY PCR

## 2023-08-28 LAB — CBC WITH DIFFERENTIAL/PLATELET
Abs Immature Granulocytes: 0.07 10*3/uL (ref 0.00–0.07)
Basophils Absolute: 0 10*3/uL (ref 0.0–0.1)
Basophils Relative: 0 %
Eosinophils Absolute: 0 10*3/uL (ref 0.0–0.5)
Eosinophils Relative: 0 %
HCT: 33.1 % — ABNORMAL LOW (ref 39.0–52.0)
Hemoglobin: 11.6 g/dL — ABNORMAL LOW (ref 13.0–17.0)
Immature Granulocytes: 1 %
Lymphocytes Relative: 10 %
Lymphs Abs: 0.9 10*3/uL (ref 0.7–4.0)
MCH: 31.7 pg (ref 26.0–34.0)
MCHC: 35 g/dL (ref 30.0–36.0)
MCV: 90.4 fL (ref 80.0–100.0)
Monocytes Absolute: 0.6 10*3/uL (ref 0.1–1.0)
Monocytes Relative: 7 %
Neutro Abs: 7.5 10*3/uL (ref 1.7–7.7)
Neutrophils Relative %: 82 %
Platelets: 173 10*3/uL (ref 150–400)
RBC: 3.66 MIL/uL — ABNORMAL LOW (ref 4.22–5.81)
RDW: 15 % (ref 11.5–15.5)
WBC: 9.1 10*3/uL (ref 4.0–10.5)
nRBC: 0 % (ref 0.0–0.2)

## 2023-08-28 LAB — PROTIME-INR
INR: 2.5 — ABNORMAL HIGH (ref 0.8–1.2)
Prothrombin Time: 27 s — ABNORMAL HIGH (ref 11.4–15.2)

## 2023-08-28 LAB — BASIC METABOLIC PANEL
Anion gap: 6 (ref 5–15)
BUN: 17 mg/dL (ref 6–20)
CO2: 24 mmol/L (ref 22–32)
Calcium: 8.1 mg/dL — ABNORMAL LOW (ref 8.9–10.3)
Chloride: 105 mmol/L (ref 98–111)
Creatinine, Ser: 1.05 mg/dL (ref 0.61–1.24)
GFR, Estimated: >60 mL/min (ref >60)
Glucose, Bld: 108 mg/dL — ABNORMAL HIGH (ref 70–99)
Potassium: 3.2 mmol/L — ABNORMAL LOW (ref 3.5–5.1)
Sodium: 135 mmol/L (ref 135–145)

## 2023-08-28 LAB — MAGNESIUM: Magnesium: 1.7 mg/dL (ref 1.7–2.4)

## 2023-08-28 LAB — BRAIN NATRIURETIC PEPTIDE: B Natriuretic Peptide: 211.1 pg/mL — ABNORMAL HIGH (ref 0.0–100.0)

## 2023-08-28 MED ORDER — MORPHINE SULFATE (PF) 2 MG/ML IV SOLN: 2.0000 mg | INTRAVENOUS | Status: DC | PRN

## 2023-08-28 MED ORDER — MAGNESIUM SULFATE 2 GM/50ML IV SOLN
2.0000 g | Freq: Once | INTRAVENOUS | Status: AC
  Administered 2023-08-28: 2 g via INTRAVENOUS
  Filled 2023-08-28: qty 50

## 2023-08-28 MED ORDER — MELATONIN 5 MG PO TABS
5.0000 mg | ORAL_TABLET | Freq: Once | ORAL | Status: AC
  Administered 2023-08-28: 5 mg via ORAL
  Filled 2023-08-28: qty 1

## 2023-08-28 MED ORDER — POTASSIUM CHLORIDE CRYS ER 20 MEQ PO TBCR
40.0000 meq | EXTENDED_RELEASE_TABLET | ORAL | Status: DC
  Administered 2023-08-28: 40 meq via ORAL
  Filled 2023-08-28: qty 2

## 2023-08-28 MED ORDER — FUROSEMIDE 10 MG/ML IJ SOLN
20.0000 mg | Freq: Once | INTRAMUSCULAR | Status: AC
  Administered 2023-08-28: 20 mg via INTRAVENOUS
  Filled 2023-08-28: qty 2

## 2023-08-28 MED ORDER — METOPROLOL TARTRATE 25 MG PO TABS: 25.0000 mg | ORAL_TABLET | Freq: Two times a day (BID) | ORAL | 1 refills | Status: DC

## 2023-08-28 NOTE — Plan of Care (Signed)
  Problem: Education: Goal: Knowledge of General Education information will improve Description: Including pain rating scale, medication(s)/side effects and non-pharmacologic comfort measures Outcome: Completed/Met   Problem: Health Behavior/Discharge Planning: Goal: Ability to manage health-related needs will improve Outcome: Completed/Met   Problem: Clinical Measurements: Goal: Ability to maintain clinical measurements within normal limits will improve Outcome: Completed/Met Goal: Will remain free from infection Outcome: Completed/Met Goal: Diagnostic test results will improve Outcome: Completed/Met Goal: Respiratory complications will improve Outcome: Completed/Met Goal: Cardiovascular complication will be avoided Outcome: Completed/Met   Problem: Activity: Goal: Risk for activity intolerance will decrease Outcome: Completed/Met   Problem: Nutrition: Goal: Adequate nutrition will be maintained Outcome: Completed/Met   Problem: Coping: Goal: Level of anxiety will decrease Outcome: Completed/Met   Problem: Elimination: Goal: Will not experience complications related to bowel motility Outcome: Completed/Met Goal: Will not experience complications related to urinary retention Outcome: Completed/Met   Problem: Pain Managment: Goal: General experience of comfort will improve and/or be controlled Outcome: Completed/Met   Problem: Safety: Goal: Ability to remain free from injury will improve Outcome: Completed/Met   Problem: Skin Integrity: Goal: Risk for impaired skin integrity will decrease Outcome: Completed/Met   Problem: Education: Goal: Knowledge of disease or condition will improve Outcome: Completed/Met Goal: Understanding of medication regimen will improve Outcome: Completed/Met Goal: Individualized Educational Video(s) Outcome: Completed/Met   Problem: Activity: Goal: Ability to tolerate increased activity will improve Outcome: Completed/Met    Problem: Cardiac: Goal: Ability to achieve and maintain adequate cardiopulmonary perfusion will improve Outcome: Completed/Met   Problem: Health Behavior/Discharge Planning: Goal: Ability to safely manage health-related needs after discharge will improve Outcome: Completed/Met

## 2023-08-28 NOTE — Consult Note (Signed)
 PHARMACY - ANTICOAGULATION CONSULT NOTE  Pharmacy Consult for Warfarin management Indication: Afib and mechanical AVR  Patient Measurements: Height: 5\' 6"  (167.6 cm) Weight: 75.3 kg (166 lb 1.6 oz) IBW/kg (Calculated) : 63.8  Labs: Recent Labs    08/26/23 0600 08/26/23 1325 08/26/23 1534 08/27/23 0546 08/27/23 0733 08/28/23 0436  HGB 11.6*  --   --  12.7*  --  11.6*  HCT 34.7*  --   --  36.5*  --  33.1*  PLT 168  --   --  153  --  173  LABPROT 28.8*  --   --  28.2*  --  27.0*  INR 2.7*  --   --  2.6*  --  2.5*  CREATININE 0.90  --   --   --  0.94 1.05  TROPONINIHS  --  27* 25*  --   --   --     Estimated Creatinine Clearance: 70.9 mL/min (by C-G formula based on SCr of 1.05 mg/dL).  Medications:  Per last Anti-coag note from 08/04/23 patient is currently on Warfarin 5 mg Mon/Fri and 2.5 mg AOD  Assessment: 58 yo male presented to ED with chest pain and palpitations, found to be in Afib.  Patient has PMH of mechanical AVR, Afib on Warfarin, HFpEF, MI, HTN, HLD, and CKD.  Pharmacy consulted to manage warfarin.  Date INR Warfarin Comments  3/12 2.6 2.5 mg Admitted with chest pain  3/13 2.7 2.5 mg   3/14 2.6 5 mg Resume PTA warfarin dose  3/15 2.5 2.5 mg    Goal of Therapy:  INR 2.5-3 per anticoag visit note 08/04/23  Monitor platelets by anticoagulation protocol: Yes   Plan:  INR remains therapeutic. Will resume warfarin PTA dose of 2.5mg  daily except for 5mg  every Mon & Fri. Monitor CBC at least every 72 hours  Bettey Costa, PharmD Clinical Pharmacist 08/28/2023 9:24 AM

## 2023-08-28 NOTE — Discharge Summary (Signed)
 Physician Discharge Summary   Patient: Ryan [name] MRN: [id number] DOB: 27-Nov-1965  Admit date:     08/25/2023  Discharge date: 08/28/23  Discharge Physician: Loyce Dys   PCP: Larae Grooms, NP   Recommendations at discharge:  Follow-up with cardiology  Discharge Diagnoses: Atrial fibrillation with RVR (HCC) status post cardioversion Chronic HFrEF (heart failure with reduced ejection fraction) (HCC) Simple renal cyst Chronic thoracic aortic dissection (HCC) Essential hypertension OSA (obstructive sleep apnea) Status post mechanical aortic valve replacement  Hospital Course: Ryan [name] is a 58 y.o. 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  Patient was found to have A-fib with RVR and initiated on diltiazem drip.  Patient was refractory to diltiazem drip and therefore initiated on amiodarone and cardiology was consulted.  Still had atrial fibrillation and was taking for DCCV with complete resolution and conversion to sinus rhythm.  Patient have been initiated on metoprolol and has been cleared by cardiologist for discharge today and will follow-up as an outpatient.   Consultants: Cardiology Procedures performed: As mentioned above Disposition: Home Diet recommendation:  Cardiac diet DISCHARGE MEDICATION: Allergies as of 08/28/2023       Reactions   Alitraq Rash   Iodinated Contrast Media Rash   Iodine Rash   CT dye CT dye CT dye CT dye        Medication List     TAKE these medications    acetaminophen 500 MG tablet Commonly known as: TYLENOL Take 500 mg by mouth every 6 (six) hours as needed for headache, fever, moderate pain or mild pain.   aspirin EC 81 MG tablet Take 81 mg by mouth daily.    fluticasone 50 MCG/ACT nasal spray Commonly known as: FLONASE Place 1 spray into both nostrils 2 (two) times daily.   High Potency Iron 65 MG Tabs Take 2 tablets by mouth daily.   hydrOXYzine 25 MG capsule Commonly known as: VISTARIL Take 1 capsule (25 mg total) by mouth every 12 (twelve) hours as needed.   lisinopril 2.5 MG tablet Commonly known as: ZESTRIL Take 1 tablet (2.5 mg total) by mouth daily.   lovastatin 20 MG tablet Commonly known as: MEVACOR TAKE 1 TABLET BY MOUTH AT BEDTIME   meclizine 50 MG tablet Commonly known as: ANTIVERT Take 1 tablet (50 mg total) by mouth 3 (three) times daily as needed.   metoprolol tartrate 25 MG tablet Commonly known as: LOPRESSOR Take 1 tablet (25 mg total) by mouth 2 (two) times daily.   oxymetazoline 0.05 % nasal spray Commonly known as: AFRIN Place 1 spray into both nostrils 2 (two) times daily.   Potassium 99 MG Tabs Take 2 tablets by mouth every evening.   sodium chloride 0.65 % Soln nasal spray Commonly known as: OCEAN Place 1 spray into both nostrils as needed for up to 14 days for congestion.   warfarin 5 MG tablet Commonly known as: COUMADIN Take as directed. If you are unsure how to take this medication, talk to your nurse or doctor. Original instructions: TAKE ONE-HALF TO ONE TABLET BY MOUTH ONCE DAILY AS INSTRUCTED BY ANTICOAGULATION CLINIC        Discharge Exam: Filed Weights   08/25/23 0311 08/27/23 0519 08/28/23 0517  Weight: 79.4 kg 76.6 kg 75.3 kg  Head: Normocephalic and atraumatic.     Mouth/Throat:     Mouth: Mucous membranes are moist.     Pharynx: Oropharynx is clear.  Cardiovascular: A-fib, S1 and S2 present Pulmonary:     Effort: Pulmonary effort is normal. No tachypnea or respiratory distress.     Breath sounds: No decreased breath sounds, wheezing, rhonchi or rales.  Abdominal:     General: There is no distension.     Palpations: Abdomen is soft.     Tenderness: There is no abdominal  tenderness.  Musculoskeletal:     Right lower leg: No edema.     Left lower leg: No edema.  Skin:    General: Skin is warm and dry.  Neurological:     General: No focal deficit present.     Mental Status: He is alert and oriented to person, place, and time.  Psychiatric:        Mood and Affect: Mood normal.        Behavior: Behavior normal.   Condition at discharge: good  The results of significant diagnostics from this hospitalization (including imaging, microbiology, ancillary and laboratory) are listed below for reference.   Imaging Studies: DG Chest Port 1 View Result Date: 08/27/2023 CLINICAL DATA:  Cough, fever EXAM: PORTABLE CHEST 1 VIEW COMPARISON:  08/25/2023 FINDINGS: Prior median sternotomy and valve replacement. Heart is borderline in size. Interstitial prominence throughout the lungs. No effusions. No acute bony abnormality. IMPRESSION: Borderline heart size. Interstitial prominence, favor interstitial edema. This could reflect atypical infection. Electronically Signed   By: Charlett Nose M.D.   On: 08/27/2023 22:28   MR ABDOMEN W WO CONTRAST Result Date: 08/26/2023 CLINICAL DATA:  Better characterization off renal lesions seen on the recent CT Angiography abdomen and pelvis for dissection. EXAM: MRI ABDOMEN WITHOUT AND WITH CONTRAST TECHNIQUE: Multiplanar multisequence MR imaging of the abdomen was performed both before and after the administration of intravenous contrast. CONTRAST:  7.36mL GADAVIST GADOBUTROL 1 MMOL/ML IV SOLN COMPARISON:  CT Angiography chest, abdomen and pelvis from 09/02/2023. FINDINGS: Lower chest: Unremarkable MR appearance to the lung bases. No pleural effusion. No pericardial effusion. Normal heart size. Hepatobiliary: The liver is normal in size and configuration. There is a 6 x 8 mm flash filling hemangioma in the right hepatic lobe, segment 7. No intrahepatic or extrahepatic bile duct dilatation. No choledocholithiasis. Status post cholecystectomy.  Pancreas: No mass, inflammatory changes or other parenchymal abnormality identified. No main pancreatic duct dilation. Spleen:  Within normal limits in size and appearance. No focal mass. Adrenals/Urinary Tract: Unremarkable adrenal glands. Bilateral extrarenal pelvis noted. No hydronephrosis or hydroureter. There are multiple simple cysts throughout bilateral kidneys with largest in the right kidney upper pole measuring up to 1.3 x 1.4 cm per The apparent hypoattenuating lesion in the right kidney lower pole described on the prior CT scan corresponds to a simple cyst and measures approximately 1.1 x 1.1 cm. The appearance and larger size estimate on the prior examination was likely artifactual due to motion. There is a partially exophytic 9 x 10 mm simple cyst arising from the left kidney lower pole, which corresponds to the lesion described on the prior CT scan. Overall, no suspicious renal lesion is seen. None of the lesions show mural nodule or internal enhancement. There are multiple simple cysts scattered throughout bilateral kidneys. Stomach/Bowel: There is a small diverticulum arising from the fundus of the stomach. Visualized portions within the abdomen are unremarkable. No disproportionate dilation of bowel loops. There are multiple  colonic diverticula without diverticulitis. Vascular/Lymphatic: No pathologically enlarged lymph nodes identified. Redemonstration of patient's known pre-existing aortic dissection. Please refer to CT Angiography report from yesterday for details no ascites. Other:  None. Musculoskeletal: No suspicious bone lesions identified. IMPRESSION: 1. No suspicious renal lesion. The lesions described on the prior CT scan corresponds to simple cysts. There are additional multiple scattered simple cysts throughout bilateral kidneys with largest in the right kidney upper pole measuring up to 1.3 x 1.4 cm. 2. Multiple other nonacute observations, as described above. Electronically Signed    By: Jules Schick M.D.   On: 08/26/2023 08:57   CT Angio Chest/Abd/Pel for Dissection W and/or Wo Contrast Result Date: 08/25/2023 CLINICAL DATA:  Sharp midline chest pain radiating to the back. Prior history of aortic dissection. Evaluating for recurrence of dissection or worsening. EXAM: CT ANGIOGRAPHY CHEST, ABDOMEN AND PELVIS TECHNIQUE: Non-contrast CT of the chest was initially obtained. Multidetector CT imaging through the chest, abdomen and pelvis was performed using the standard protocol during bolus administration of intravenous contrast. Multiplanar reconstructed images and MIPs were obtained and reviewed to evaluate the vascular anatomy. RADIATION DOSE REDUCTION: This exam was performed according to the departmental dose-optimization program which includes automated exposure control, adjustment of the mA and/or kV according to patient size and/or use of iterative reconstruction technique. CONTRAST:  OMNIPAQUE IOHEXOL 350 MG/ML SOLN COMPARISON:  CT angiography chest, abdomen and pelvis from 10/11/2022. FINDINGS: CTA CHEST FINDINGS Cardiovascular: No intramural hematoma noted in the thoracic aorta on the unenhanced images. 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 The aortic arch measures up to 4.4-4.6 cm (previously when measured in similar fashion it measured 4.4-4.4 cm). The proximal descending thoracic aorta measures 5.3 x 5.5 cm (previously 5.2 x 5.4 cm). The native aneurysmal sac dimensions in the abdomen are grossly unchanged. Proximally the dissection flap extends slightly in the origin of the right innominate artery and extends into the right common carotid artery however, superior extent of the dissection flap extension is not imaged on this exam. Please note, the involvement is essentially similar to the prior study. The left common carotid artery arises from the  true lumen only. The dissection flap extends into the proximal portion of left subclavian artery for short length, similar to the prior study. In the descending thoracic aorta, the true lumen is along the anterior aspect extending from 11-1 o'clock position, similar to the prior study. The true lumen occupies less than 25% of the cross-sectional surface of the aortic lumen. The The celiac trunk, superior mesenteric artery and inferior mesenteric artery arises from the true lumen. The right renal artery predominantly arises from the true lumen however, the flap extends at the origin of the artery. The left renal artery arises from the true and false lumen with smaller true lumen along the anterior aspect. Normal cardiac size. No pericardial effusion. There is satisfactory opacification of pulmonary arteries. There is no embolism to the proximal subsegmental pulmonary artery level. Prosthetic aortic valve noted. There is dilation of the aortic root; however, similar to the prior study. Mediastinum/Nodes: Visualized thyroid gland appears grossly unremarkable. No solid / cystic mediastinal masses. The esophagus is nondistended precluding optimal assessment. There are few mildly prominent mediastinal lymph nodes, which do not meet the size criteria for lymphadenopathy but new since the prior study. These are indeterminate but favored benign/reactive in the given clinical setting. No axillary or hilar lymphadenopathy by size  criteria. Lungs/Pleura: The central tracheo-bronchial tree is patent. There is mild, smooth, circumferential thickening of the segmental and subsegmental bronchial walls, throughout bilateral lungs, which is nonspecific. Findings are most commonly seen with bronchitis or reactive airway disease, such as asthma. There are patchy areas of linear, plate-like atelectasis and/or scarring throughout bilateral lungs. no mass or consolidation. No pleural effusion or pneumothorax. There are several, stable,  sub 4 mm, calcified and noncalcified nodules in the bilateral upper lobes (marked with electronic arrow sign on series 2009). No new or suspicious lung nodule. Musculoskeletal: The visualized soft tissues of the chest wall are grossly unremarkable. No suspicious osseous lesions. There are mild multilevel degenerative changes in the visualized spine. Sternotomy wires noted. Review of the MIP images confirms the above findings. CTA ABDOMEN AND PELVIS FINDINGS VASCULAR Aorta: Please see above for details. Celiac:  Please see above for details. SMA:  Please see above for details. Renals:  Please see above for details. IMA:  Please see above for details. Inflow:  Please see above for details. Veins: No obvious venous abnormality within the limitations of this arterial phase study. Review of the MIP images confirms the above findings. NON-VASCULAR Hepatobiliary: The liver is normal in size. Non-cirrhotic configuration. No suspicious mass. No intrahepatic or extrahepatic bile duct dilation. Gallbladder is surgically absent. Pancreas: Unremarkable. No pancreatic ductal dilatation or surrounding inflammatory changes. Spleen: The Within normal limits. No focal lesion. Adrenals/Urinary Tract: Adrenal glands are unremarkable. There is an approximately 1.4 x 1.8 cm hypoattenuating lesion arising from the right kidney lower pole, anteriorly. Exact comparison with prior imaging is limited however, the lesion previously measured up to 1.1 x 1.1 cm. The lesion exhibits internal CT attenuation of 38 Hounsfield units and is concerning for neoplastic process. Further evaluation with multiphasic contrast-enhanced MRI abdomen as per renal mass protocol is recommended. There is an additional 9 x 10 mm partially exophytic lesion arising from the left kidney lower pole, medially. The lesion exhibits internal CT attenuation of 53-58 Hounsfield units. The lesion appears grossly unchanged since the prior study. This is also indeterminate and  can not be evaluated with the multiphasic MRI abdomen as previously recommended. Redemonstration of a partially exophytic approximately 1 cm sized hypoattenuating structure arising from the right kidney lower pole, posteriorly. There is an additional simple cyst in the right kidney upper pole. No hydroureteronephrosis on either side. Bilateral extrarenal pelves noted. No nephroureterolithiasis. Unremarkable urinary bladder. Stomach/Bowel: No disproportionate dilation of the small or large bowel loops. No evidence of abnormal bowel wall thickening or inflammatory changes. The appendix is unremarkable. There are multiple diverticula throughout the colon, without imaging signs of diverticulitis. Vascular/Lymphatic: No ascites or pneumoperitoneum. Redemonstration of mistiness in the central mesentery about the mesenteric vessels along with multiple subcentimeter sized nodules with spared halo and few dystrophic calcifications, essentially similar to the prior study. Findings are nonspecific but in appropriate clinical settings can be seen with sclerosing mesenteritis. Reproductive: Enlarged prostate. Symmetric seminal vesicles. Other: There is partially imaged at least moderate sized left inguinal scrotal hernia containing fat. There is small fat containing right inguinal hernia. The soft tissues and abdominal wall are otherwise unremarkable. Musculoskeletal: No suspicious osseous lesions. There are mild - moderate multilevel degenerative changes in the visualized spine. Review of the MIP images confirms the above findings. IMPRESSION: 1. 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 There is minimal increase in the diameter of aneurysmal sac in the  thoracic aorta. No significant interval change in the aneurysmal sac dimensions in the abdominal aorta. Please see elbow for details of the true  lumen/false lumen and extension of the dissection flap. 2. There is an approximately 1.4 x 1.8 cm hypoattenuating lesion arising from the right kidney lower pole, anteriorly. Exact comparison with prior imaging is limited however, the lesion previously measured up to 1.1 x 1.1 cm. The lesion exhibits internal CT attenuation of 38 Hounsfield units and is concerning for neoplastic process. Further evaluation with multiphasic contrast-enhanced MRI abdomen as per renal mass protocol is recommended. 3. There is an additional 9 x 10 mm partially exophytic lesion arising from the left kidney lower pole, medially. The lesion exhibits internal CT attenuation of 53-58 Hounsfield units. The lesion appears grossly unchanged since the prior study. This is also indeterminate and can not be evaluated with the multiphasic MRI abdomen. 4. Multiple other nonacute observations, as described above. 5. Aortic atherosclerosis. Aortic Atherosclerosis (ICD10-I70.0). Electronically Signed   By: Jules Schick M.D.   On: 08/25/2023 09:33   DG Chest Portable 1 View Result Date: 08/25/2023 CLINICAL DATA:  Chest pain with shortness of breath and AFib. EXAM: PORTABLE CHEST 1 VIEW COMPARISON:  10/11/2022 FINDINGS: Previous median sternotomy. Cardiomediastinal contours are stable. There is enlargement of the aortic knob compatible with known aneurysm. Pulmonary vascular congestion. No pleural fluid, interstitial edema or airspace disease. The visualized osseous structures are unremarkable. IMPRESSION: 1. Pulmonary vascular congestion. 2. Enlargement of the aortic knob compatible with known aneurysm. Electronically Signed   By: Signa Kell M.D.   On: 08/25/2023 05:50    Microbiology: Results for orders placed or performed during the hospital encounter of 08/25/23  Resp panel by RT-PCR (RSV, Flu A&B, Covid) Anterior Nasal Swab     Status: None   Collection Time: 08/27/23  9:59 PM   Specimen: Anterior Nasal Swab  Result Value Ref Range  Status   SARS Coronavirus 2 by RT PCR NEGATIVE NEGATIVE Final    Comment: (NOTE) SARS-CoV-2 target nucleic acids are NOT DETECTED.  The SARS-CoV-2 RNA is generally detectable in upper respiratory specimens during the acute phase of infection. The lowest concentration of SARS-CoV-2 viral copies this assay can detect is 138 copies/mL. A negative result does not preclude SARS-Cov-2 infection and should not be used as the sole basis for treatment or other patient management decisions. A negative result may occur with  improper specimen collection/handling, submission of specimen other than nasopharyngeal swab, presence of viral mutation(s) within the areas targeted by this assay, and inadequate number of viral copies(<138 copies/mL). A negative result must be combined with clinical observations, patient history, and epidemiological information. The expected result is Negative.  Fact Sheet for Patients:  BloggerCourse.com  Fact Sheet for Healthcare Providers:  SeriousBroker.it  This test is no t yet approved or cleared by the [place] FDA and  has been authorized for detection and/or diagnosis of SARS-CoV-2 by FDA under an Emergency Use Authorization (EUA). This EUA will remain  in effect (meaning this test can be used) for the duration of the COVID-19 declaration under Section 564(b)(1) of the Act, 21 U.S.C.section 360bbb-3(b)(1), unless the authorization is terminated  or revoked sooner.       Influenza A by PCR NEGATIVE NEGATIVE Final   Influenza B by PCR NEGATIVE NEGATIVE Final    Comment: (NOTE) The Xpert Xpress SARS-CoV-2/FLU/RSV plus assay is intended as an aid in the diagnosis of influenza from Nasopharyngeal swab specimens and should not be used  as a sole basis for treatment. Nasal washings and aspirates are unacceptable for Xpert Xpress SARS-CoV-2/FLU/RSV testing.  Fact Sheet for  Patients: BloggerCourse.com  Fact Sheet for Healthcare Providers: SeriousBroker.it  This test is not yet approved or cleared by the [place] FDA and has been authorized for detection and/or diagnosis of SARS-CoV-2 by FDA under an Emergency Use Authorization (EUA). This EUA will remain in effect (meaning this test can be used) for the duration of the COVID-19 declaration under Section 564(b)(1) of the Act, 21 U.S.C. section 360bbb-3(b)(1), unless the authorization is terminated or revoked.     Resp Syncytial Virus by PCR NEGATIVE NEGATIVE Final    Comment: (NOTE) Fact Sheet for Patients: BloggerCourse.com  Fact Sheet for Healthcare Providers: SeriousBroker.it  This test is not yet approved or cleared by the [place] FDA and has been authorized for detection and/or diagnosis of SARS-CoV-2 by FDA under an Emergency Use Authorization (EUA). This EUA will remain in effect (meaning this test can be used) for the duration of the COVID-19 declaration under Section 564(b)(1) of the Act, 21 U.S.C. section 360bbb-3(b)(1), unless the authorization is terminated or revoked.  Performed at [facility], [address] [place], [place] [address]   Respiratory (~20 pathogens) panel by PCR     Status: None   Collection Time: 08/27/23 11:02 PM   Specimen: Nasopharyngeal Swab; Respiratory  Result Value Ref Range Status   Adenovirus NOT DETECTED NOT DETECTED Final   Coronavirus 229E NOT DETECTED NOT DETECTED Final    Comment: (NOTE) The Coronavirus on the Respiratory Panel, DOES NOT test for the novel  Coronavirus (2019 nCoV)    Coronavirus HKU1 NOT DETECTED NOT DETECTED Final   Coronavirus NL63 NOT DETECTED NOT DETECTED Final   Coronavirus OC43 NOT DETECTED NOT DETECTED Final   Metapneumovirus NOT DETECTED NOT DETECTED Final   Rhinovirus / Enterovirus NOT DETECTED NOT  DETECTED Final   Influenza A NOT DETECTED NOT DETECTED Final   Influenza B NOT DETECTED NOT DETECTED Final   Parainfluenza Virus 1 NOT DETECTED NOT DETECTED Final   Parainfluenza Virus 2 NOT DETECTED NOT DETECTED Final   Parainfluenza Virus 3 NOT DETECTED NOT DETECTED Final   Parainfluenza Virus 4 NOT DETECTED NOT DETECTED Final   Respiratory Syncytial Virus NOT DETECTED NOT DETECTED Final   Bordetella pertussis NOT DETECTED NOT DETECTED Final   Bordetella Parapertussis NOT DETECTED NOT DETECTED Final   Chlamydophila pneumoniae NOT DETECTED NOT DETECTED Final   Mycoplasma pneumoniae NOT DETECTED NOT DETECTED Final    Comment: Performed at [facility], [address], [place], [place] [address]    Labs: CBC: Recent Labs  Lab 08/25/23 0319 08/26/23 0600 08/27/23 0546 08/28/23 0436  WBC 6.3 9.4 11.0* 9.1  NEUTROABS 4.2  --  9.0* 7.5  HGB 12.2* 11.6* 12.7* 11.6*  HCT 35.7* 34.7* 36.5* 33.1*  MCV 95.5 94.3 93.1 90.4  PLT 152 168 153 173   Basic Metabolic Panel: Recent Labs  Lab 08/25/23 0319 08/25/23 0527 08/26/23 0600 08/27/23 0733 08/28/23 0436  NA 138  --  140 137 135  K 3.0*  --  3.9 3.6 3.2*  CL 108  --  112* 106 105  CO2 21*  --  24 22 24   GLUCOSE 125*  --  116* 118* 108*  BUN 13  --  11 16 17   CREATININE 0.90  --  0.90 0.94 1.05  CALCIUM 8.2*  --  8.1* 8.3* 8.1*  MG  --  2.0  2.0  --  1.7   Liver Function Tests: Recent Labs  Lab 08/25/23 0319  AST 21  ALT 14  ALKPHOS 49  BILITOT 0.8  PROT 6.1*  ALBUMIN 3.5   CBG: No results for input(s): "GLUCAP" in the last 168 hours.  Discharge time spent:  .  Signed: Loyce Dys, MD [facility] 08/28/2023

## 2023-08-28 NOTE — Progress Notes (Signed)
 Rounding Note    Patient Name: Ryan [name] Date of Encounter: 08/28/2023  [facility] Cardiologist: Yvonne Kendall, MD   Subjective   Feeling well.  Breathing is stable when walking around the room.  Eager to go home.  Inpatient Medications    Scheduled Meds:  aspirin EC  81 mg Oral Daily   ferrous sulfate  650 mg Oral Daily   metoprolol tartrate  25 mg Oral BID   potassium chloride  40 mEq Oral Q4H   pravastatin  20 mg Oral q1800   warfarin  2.5 mg Oral Once per day on Sunday Tuesday Wednesday Thursday Saturday   And   warfarin  5 mg Oral Once per day on Monday Friday   Warfarin - Pharmacist Dosing Inpatient   Does not apply q1600   Continuous Infusions:  PRN Meds: acetaminophen, guaiFENesin-dextromethorphan, morphine injection, ondansetron (ZOFRAN) IV   Vital Signs    Vitals:   08/28/23 0440 08/28/23 0517 08/28/23 0800 08/28/23 0910  BP: 102/66  (!) 96/53 115/79  Pulse: 63  64 64  Resp: 20  15 20   Temp: 98.5 F (36.9 C)  99.4 F (37.4 C)   TempSrc:   Oral   SpO2: 94%  100%   Weight:  75.3 kg    Height:        Intake/Output Summary (Last 24 hours) at 08/28/2023 1059 Last data filed at 08/28/2023 0315 Gross per 24 hour  Intake 465.46 ml  Output 825 ml  Net -359.54 ml      08/28/2023    5:17 AM 08/27/2023    5:19 AM 08/25/2023    3:11 AM  Last 3 Weights  Weight (lbs) 166 lb 1.6 oz 168 lb 14.4 oz 175 lb  Weight (kg) 75.342 kg 76.613 kg 79.379 kg      Telemetry    Sinus rhythm.  5 beat NSVT.- Personally Reviewed  ECG    N/a - Personally Reviewed  Physical Exam   VS:  BP 115/79   Pulse 64   Temp 99.4 F (37.4 C) (Oral)   Resp 20   Ht 5\' 6"  (1.676 m)   Wt 75.3 kg   SpO2 100%   BMI 26.81 kg/m  , BMI Body mass index is 26.81 kg/m. GENERAL:  Well appearing.  No acute distress HEENT: Pupils equal round and reactive, fundi not visualized, oral mucosa unremarkable NECK:  No jugular venous distention, waveform within normal limits,  carotid upstroke brisk and symmetric, no bruits, no thyromegaly LUNGS: Mild crackles right base.  Otherwise clear. HEART:  RRR.  PMI not displaced or sustained,S1 and S2 within normal limits, no S3, no S4, no clicks, no rubs, II/VI systolic murmur at left upper sternal border. ABD:  Flat, positive bowel sounds normal in frequency in pitch, no bruits, no rebound, no guarding, no midline pulsatile mass, no hepatomegaly, no splenomegaly EXT:  2 plus pulses throughout, no edema, no cyanosis no clubbing SKIN:  No rashes no nodules NEURO:  Cranial nerves II through XII grossly intact, motor grossly intact throughout PSYCH:  Cognitively intact, oriented to person place and time   Labs    High Sensitivity Troponin:   Recent Labs  Lab 08/25/23 0319 08/25/23 0527 08/26/23 1325 08/26/23 1534  TROPONINIHS 34* 38* 27* 25*     Chemistry Recent Labs  Lab 08/25/23 0319 08/25/23 0527 08/26/23 0600 08/27/23 0733 08/28/23 0436  NA 138  --  140 137 135  K 3.0*  --  3.9 3.6  3.2*  CL 108  --  112* 106 105  CO2 21*  --  24 22 24   GLUCOSE 125*  --  116* 118* 108*  BUN 13  --  11 16 17   CREATININE 0.90  --  0.90 0.94 1.05  CALCIUM 8.2*  --  8.1* 8.3* 8.1*  MG  --  2.0 2.0  --  1.7  PROT 6.1*  --   --   --   --   ALBUMIN 3.5  --   --   --   --   AST 21  --   --   --   --   ALT 14  --   --   --   --   ALKPHOS 49  --   --   --   --   BILITOT 0.8  --   --   --   --   GFRNONAA >60  --  >60 >60 >60  ANIONGAP 9  --  4* 9 6    Lipids No results for input(s): "CHOL", "TRIG", "HDL", "LABVLDL", "LDLCALC", "CHOLHDL" in the last 168 hours.  Hematology Recent Labs  Lab 08/26/23 0600 08/27/23 0546 08/28/23 0436  WBC 9.4 11.0* 9.1  RBC 3.68* 3.92* 3.66*  HGB 11.6* 12.7* 11.6*  HCT 34.7* 36.5* 33.1*  MCV 94.3 93.1 90.4  MCH 31.5 32.4 31.7  MCHC 33.4 34.8 35.0  RDW 14.7 15.1 15.0  PLT 168 153 173   Thyroid No results for input(s): "TSH", "FREET4" in the last 168 hours.  BNP Recent Labs  Lab  08/25/23 0319 08/28/23 0436  BNP 213.9* 211.1*    DDimer No results for input(s): "DDIMER" in the last 168 hours.   Radiology    DG Chest Port 1 View Result Date: 08/27/2023 CLINICAL DATA:  Cough, fever EXAM: PORTABLE CHEST 1 VIEW COMPARISON:  08/25/2023 FINDINGS: Prior median sternotomy and valve replacement. Heart is borderline in size. Interstitial prominence throughout the lungs. No effusions. No acute bony abnormality. IMPRESSION: Borderline heart size. Interstitial prominence, favor interstitial edema. This could reflect atypical infection. Electronically Signed   By: Charlett Nose M.D.   On: 08/27/2023 22:28    Cardiac Studies   Echo 10/12/2022:  1. Left ventricular ejection fraction, by estimation, is 40 to 45%. The  left ventricle has mildly decreased function. Left ventricular endocardial  border not optimally defined to evaluate regional wall motion. There is  moderate left ventricular  hypertrophy. Left ventricular diastolic parameters are indeterminate.   2. Right ventricular systolic function is normal. The right ventricular  size is normal.   3. Left atrial size was moderately dilated.   4. Right atrial size was mildly dilated.   5. The mitral valve is normal in structure. Mild mitral valve  regurgitation. No evidence of mitral stenosis.   6. The aortic valve has been repaired/replaced. Aortic valve  regurgitation is trivial. No aortic stenosis is present. Echo findings are  consistent with normal structure and function of the aortic valve  prosthesis. Aortic valve mean gradient measures 9.5   mmHg.   7. Technically difficult study due to poor echo windows and suboptimal  parasternal window.   Patient Profile     58 y.o. 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     Assessment & Plan    # PAF/flutter: He  underwent successful DCCV on 10/27/2023.  Continue metoprolol.  He is maintaining sinus rhythm.  INR is therapeutic on warfarin.  # HFmrEF: BNP 211.  LVEF 40-45% on echo 09/2022.  His home lisinopril is on hold as blood pressures have been low.  Would aim to restart this as an outpatient if tolerated.  He received a dose of IV Lasix.  Does have mild crackles on exam but his oxygen saturation levels are within normal limits on room air.  He is ambulating well.  Reassess Lyme status in clinic.  # s/p aortic dissection and root repair/mechanical AVR:  Aortic valve mean gradient 9.5 mmHg/2024.  Continue aspirin and warfarin.  # Non-obstructive CAD:  # Hyperlipidemia:  Continue aspirin, metoprolol, and pravastatin.  [facility] will sign off.   Medication Recommendations: Attempt to add back ACE inhibitor as an outpatient. Other recommendations (labs, testing, etc):   Follow up as an outpatient: We will arrange  For questions or updates, please contact [facility] Please consult [email or web address] for contact info under        Signed, Chilton Si, MD  08/28/2023, 10:59 AM

## 2023-08-28 NOTE — Plan of Care (Signed)

## 2023-08-30 ENCOUNTER — Telehealth: Payer: Self-pay | Admitting: Internal Medicine

## 2023-08-30 ENCOUNTER — Telehealth: Payer: Self-pay

## 2023-08-30 ENCOUNTER — Encounter: Payer: Self-pay | Admitting: Cardiovascular Disease

## 2023-08-30 NOTE — Telephone Encounter (Signed)
 Left voicemail for return call to schedule  hospital follow up with Dr. Okey Dupre or Cadence Fransico Michael

## 2023-08-30 NOTE — Telephone Encounter (Signed)
 Per chart review, the patient has an appointment scheduled with Dr. Okey Dupre on 09/01/23 at 10:20 AM.

## 2023-08-30 NOTE — Transitions of Care (Post Inpatient/ED Visit) (Signed)
   08/30/2023  Name: Ryan [name] MRN: [id number] DOB: 1966/01/07  Today's TOC FU Call Status: Today's TOC FU Call Status:: Unsuccessful Call (1st Attempt) Unsuccessful Call (1st Attempt) Date: 08/30/23  Attempted to reach the patient regarding the most recent Inpatient/ED visit. Patient was called in an Outreach attempt to offer VBCI  30-day TOC program. Pt is eligible for program due to potential risk for readmission and/or high utilization. Unfortunately, I was not able to speak with the patient in regards to recent hospital discharge   Left a HIPAA compliant phone message for patient including VBCI CM contact information with request for a call back in regard to recent hospital discharge     Follow Up Plan: Additional outreach attempts will be made to reach the patient to complete the Transitions of Care (Post Inpatient/ED visit) call.   Susa Loffler , BSN, RN [facility]   VBCI-Population Health RN Care Manager Direct Dial [phone]  Fax: [phone] Website: [email or web address]

## 2023-08-31 ENCOUNTER — Telehealth: Payer: Self-pay

## 2023-08-31 NOTE — Transitions of Care (Post Inpatient/ED Visit) (Signed)
   08/31/2023  Name: Ryan [name] MRN: [id number] DOB: Sep 20, 1965  Today's TOC FU Call Status: Today's TOC FU Call Status:: Unsuccessful Call (2nd Attempt) Unsuccessful Call (1st Attempt) Date: 08/30/23 Unsuccessful Call (2nd Attempt) Date: 08/31/23  Attempted to reach the patient regarding the most recent Inpatient/ED visit. Patient was called in an Outreach attempt to offer VBCI  30-day TOC program. Pt is eligible for program due to potential risk for readmission and/or high utilization. Unfortunately, I was not able to speak with the patient in regards to recent hospital discharge   Left a HIPAA compliant phone message for patient including VBCI CM contact information with request for a call back in regard to recent hospital discharge    Follow Up Plan: Additional outreach attempts will be made to reach the patient to complete the Transitions of Care (Post Inpatient/ED visit) call.   Susa Loffler , BSN, RN [facility]   VBCI-Population Health RN Care Manager Direct Dial [phone]  Fax: [phone] Website: [email or web address]

## 2023-09-01 ENCOUNTER — Ambulatory Visit: Attending: Internal Medicine | Admitting: Internal Medicine

## 2023-09-01 ENCOUNTER — Telehealth: Payer: Self-pay

## 2023-09-01 ENCOUNTER — Encounter: Payer: Self-pay | Admitting: Internal Medicine

## 2023-09-01 VITALS — BP 130/62 | HR 48 | Ht 67.0 in | Wt 168.6 lb

## 2023-09-01 DIAGNOSIS — E785 Hyperlipidemia, unspecified: Secondary | ICD-10-CM

## 2023-09-01 DIAGNOSIS — I480 Paroxysmal atrial fibrillation: Secondary | ICD-10-CM

## 2023-09-01 DIAGNOSIS — R0609 Other forms of dyspnea: Secondary | ICD-10-CM

## 2023-09-01 DIAGNOSIS — Z952 Presence of prosthetic heart valve: Secondary | ICD-10-CM | POA: Diagnosis not present

## 2023-09-01 DIAGNOSIS — I7100 Dissection of unspecified site of aorta: Secondary | ICD-10-CM

## 2023-09-01 DIAGNOSIS — R0602 Shortness of breath: Secondary | ICD-10-CM

## 2023-09-01 DIAGNOSIS — E876 Hypokalemia: Secondary | ICD-10-CM

## 2023-09-01 DIAGNOSIS — I10 Essential (primary) hypertension: Secondary | ICD-10-CM

## 2023-09-01 MED ORDER — METOPROLOL TARTRATE 25 MG PO TABS: 12.5000 mg | ORAL_TABLET | Freq: Two times a day (BID) | ORAL | 3 refills | Status: DC

## 2023-09-01 NOTE — Progress Notes (Unsigned)
 Cardiology Office Note:  .   Date:  09/02/2023  ID:  Ryan [name], DOB 02-13-1966, MRN [id number] PCP: Larae Grooms, NP  [facility] Providers Cardiologist:  Yvonne Kendall, MD     History of Present Illness: .   Ryan [name] is a 58 y.o. 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 Laneta Simmers, stroke/TIAs without residual deficits, paroxysmal atrial fibrillation, hypertension, hyperlipidemia, and obstructive sleep apnea,, who presents for hospital follow-up.  He was admitted last week with chest pain and palpitations and was found to be in atrial fibrillation with rapid ventricular response.  He was initially treated with rate control and ultimately underwent successful cardioversion.  Since leaving the hospital, he has felt somewhat fatigued.  He tried walking at [facility] yesterday and was limited by exertional dyspnea.  He has not had any chest pain, palpitations, lightheadedness, or edema.  He reports being compliant with his medicines.  Only addition at the time of discharge to his medication regimen was metoprolol to tartrate 25 mg twice daily.  ROS: See HPI  Studies Reviewed: Marland Kitchen   EKG Interpretation Date/Time:  Wednesday September 01 2023 11:01:52 EDT Ventricular Rate:  43 PR Interval:  84 QRS Duration:  158 QT Interval:  510 QTC Calculation: 430 R Axis:   20  Text Interpretation: Marked sinus bradycardia Left bundle branch block Abnormal ECG When compared with ECG of 27-Aug-2023 21:45, Vent. rate has decreased BY  33 BPM Confirmed by Kambre Messner [id number]) on 09/01/2023 11:03:34 AM    Risk Assessment/Calculations:    CHA2DS2-VASc Score = 4   This indicates a 4.8% annual risk of stroke. The patient's score is based upon: CHF History: 0 HTN History: 1 Diabetes History: 0 Stroke  History: 2 Vascular Disease History: 1 Age Score: 0 Gender Score: 0            Physical Exam:   VS:  BP 130/62 (BP Location: Left Arm, Patient Position: Sitting, Cuff Size: Normal)   Pulse (!) 48   Ht 5\' 7"  (1.702 m)   Wt 168 lb 9.6 oz (76.5 kg)   SpO2 98%   BMI 26.41 kg/m    Wt Readings from Last 3 Encounters:  09/01/23 168 lb 9.6 oz (76.5 kg)  08/28/23 166 lb 1.6 oz (75.3 kg)  08/11/23 166 lb (75.3 kg)    General:  NAD. Neck: No JVD or HJR. Lungs: Clear to auscultation bilaterally without wheezes or crackles. Heart: Regular rate and rhythm with 2/6 systolic murmur.  No rubs or gallops. Abdomen: Soft, nontender, nondistended. Extremities: No lower extremity edema.  ASSESSMENT AND PLAN: .    Paroxysmal atrial fibrillation and dyspnea on exertion: [name] is maintaining sinus rhythm today following his recent hospitalization for atrial fibrillation with rapid ventricular response ultimately requiring cardioversion.  He reports some exertional dyspnea and fatigue since leaving the hospital, which could be due to bradycardia in the setting of beta-blocker use following his hospitalization.  We have agreed to decrease metoprolol to tartrate to 12.5 mg twice daily.  I will also check a TSH today as well as a BMP, given that mild hypokalemia was noted on his last labs in the hospital.  Continue indefinite anticoagulation with warfarin in the setting of PAF as well as mechanical aortic valve.  We will obtain an echocardiogram to evaluate for new  structural abnormalities, particular given recurrent PAF and recent fatigue/dyspnea on exertion.  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  Continue anticoagulation/antiplatelet therapy with aspirin and warfarin.  Continue blood pressure control and follow-up with cardiac surgery (due in 10/2023).  Obtain echocardiogram.  Hypertension: Blood pressure upper normal today.  We will decrease  metoprolol to tartrate, as above but continue low-dose lisinopril.  This may need to be increased if blood pressure trends up with de-escalation of lisinopril.  Hyperlipidemia: Lipids well-controlled on last check a month ago with LDL of 37 on the lovastatin.  Defer medication changes at this time.  Hypokalemia: Mildly low potassium noted during recent admission.  Recheck BMP today.    Dispo: Return to clinic in 1 month.  Signed, Yvonne Kendall, MD

## 2023-09-01 NOTE — Patient Instructions (Signed)
 Medication Instructions:  Your physician recommends the following medication changes.  DECREASE: Metoprolol to 12.5 mg by mouth twice a day  *If you need a refill on your cardiac medications before your next appointment, please call your pharmacy*   Lab Work: Your provider would like for you to have following labs drawn today (BMP, TSH).     Testing/Procedures: Your physician has requested that you have an echocardiogram. Echocardiography is a painless test that uses sound waves to create images of your heart. It provides your doctor with information about the size and shape of your heart and how well your heart's chambers and valves are working.   You may receive an ultrasound enhancing agent through an IV if needed to better visualize your heart during the echo. This procedure takes approximately one hour.  There are no restrictions for this procedure.  This will take place at [address] [facility] [address]) [address], [place] [address]  Please note: We ask at that you not bring children with you during ultrasound (echo/ vascular) testing. Due to room size and safety concerns, children are not allowed in the ultrasound rooms during exams. Our front office staff cannot provide observation of children in our lobby area while testing is being conducted. An adult accompanying a patient to their appointment will only be allowed in the ultrasound room at the discretion of the ultrasound technician under special circumstances. We apologize for any inconvenience. Your physician has requested that you have an echocardiogram. Echocardiography is a painless test that uses sound waves to create images of your heart. It provides your doctor with information about the size and shape of your heart and how well your heart's chambers and valves are working.   You may receive an ultrasound enhancing agent through an IV if needed to better visualize your heart during the echo. This procedure takes  approximately one hour.  There are no restrictions for this procedure.  This will take place at [address] [facility] [address], [place] [address]  Please note: We ask at that you not bring children with you during ultrasound (echo/ vascular) testing. Due to room size and safety concerns, children are not allowed in the ultrasound rooms during exams. Our front office staff cannot provide observation of children in our lobby area while testing is being conducted. An adult accompanying a patient to their appointment will only be allowed in the ultrasound room at the discretion of the ultrasound technician under special circumstances. We apologize for any inconvenience.    Follow-Up: At [facility], you and your health needs are our priority.  As part of our continuing mission to provide you with exceptional heart care, we have created designated Provider Care Teams.  These Care Teams include your primary Cardiologist (physician) and Advanced Practice Providers (APPs -  Physician Assistants and Nurse Practitioners) who all work together to provide you with the care you need, when you need it.  We recommend signing up for the patient portal called "MyChart".  Sign up information is provided on this After Visit Summary.  MyChart is used to connect with patients for Virtual Visits (Telemedicine).  Patients are able to view lab/test results, encounter notes, upcoming appointments, etc.  Non-urgent messages can be sent to your provider as well.   To learn more about what you can do with MyChart, go to [email or web address].    Your next appointment:   1 month(s)  Provider:   You may see Yvonne Kendall, MD or one  of the following Advanced Practice Providers on your designated Care Team:   Nicolasa Ducking, NP Eula Listen, PA-C Cadence Fransico Michael, PA-C Charlsie Quest, NP Carlos Levering, NP

## 2023-09-01 NOTE — Transitions of Care (Post Inpatient/ED Visit) (Signed)
   09/01/2023  Name: Ryan [name] MRN: [id number] DOB: 08/29/1965  Today's TOC FU Call Status: Today's TOC FU Call Status:: Unsuccessful Call (3rd Attempt) Unsuccessful Call (1st Attempt) Date: 08/30/23 Unsuccessful Call (2nd Attempt) Date: 08/31/23 Unsuccessful Call (3rd Attempt) Date: 09/01/23  Attempted to reach the patient regarding the most recent Inpatient/ED visit. Patient was called in an Outreach attempt to offer VBCI  30-day TOC program. Pt is eligible for program due to potential risk for readmission and/or high utilization. Unfortunately, I was not able to speak with the patient in regards to their most recent hospital discharge  Three attempts have been made to reach this patient . ( See above  outreach dates)   Based on the VBCI program guidelines, if  we are unable to reach the patient  after 3 attempts, no additional outreach attempts will be made and the TOC follow-up will be closed Unfortunately, we have been unable to make contact with the patient for follow up.   The [facility] Case Management Team is available to follow up with the patient after provider conversation with the patient regarding recommendation for care management engagement and subsequent re-referral to the case management team.The VBCI CM team can be reached by calling [phone].   Follow Up Plan: No further outreach attempts will be made at this time. We have been unable to contact the patient.  Susa Loffler , BSN, RN [facility]   VBCI-Population Health RN Care Manager Direct Dial [phone]  Fax: [phone] Website: [email or web address]

## 2023-09-02 ENCOUNTER — Encounter: Payer: Self-pay | Admitting: Internal Medicine

## 2023-09-02 LAB — BASIC METABOLIC PANEL
BUN/Creatinine Ratio: 10 (ref 9–20)
BUN: 10 mg/dL (ref 6–24)
CO2: 23 mmol/L (ref 20–29)
Calcium: 8.5 mg/dL — ABNORMAL LOW (ref 8.7–10.2)
Chloride: 106 mmol/L (ref 96–106)
Creatinine, Ser: 1.02 mg/dL (ref 0.76–1.27)
Glucose: 99 mg/dL (ref 70–99)
Potassium: 4.1 mmol/L (ref 3.5–5.2)
Sodium: 140 mmol/L (ref 134–144)
eGFR: 86 mL/min/{1.73_m2} (ref >59)

## 2023-09-02 LAB — TSH: TSH: 4.19 u[IU]/mL (ref 0.450–4.500)

## 2023-09-08 ENCOUNTER — Ambulatory Visit: Attending: Internal Medicine

## 2023-09-08 DIAGNOSIS — R0609 Other forms of dyspnea: Secondary | ICD-10-CM | POA: Diagnosis not present

## 2023-09-08 LAB — ECHOCARDIOGRAM COMPLETE
AR max vel: 3.25 cm2
AV Area VTI: 3.32 cm2
AV Area mean vel: 3.22 cm2
AV Mean grad: 6.8 mmHg
AV Peak grad: 13.6 mmHg
Ao pk vel: 1.84 m/s
Calc EF: 46.9 %
P 1/2 time: 439 ms
Single Plane A2C EF: 46.7 %
Single Plane A4C EF: 49.8 %

## 2023-09-10 ENCOUNTER — Other Ambulatory Visit: Payer: Self-pay | Admitting: Internal Medicine

## 2023-09-10 DIAGNOSIS — I480 Paroxysmal atrial fibrillation: Secondary | ICD-10-CM

## 2023-09-10 MED ORDER — WARFARIN SODIUM 5 MG PO TABS: ORAL_TABLET | ORAL | 0 refills | Status: DC

## 2023-09-10 NOTE — Telephone Encounter (Signed)
 Warfarin 5mg   Afib Last INR 07/07/23 and pending appt 09/15/23 Last OV  09/01/23

## 2023-09-15 ENCOUNTER — Ambulatory Visit: Payer: No Typology Code available for payment source | Attending: Internal Medicine

## 2023-09-15 DIAGNOSIS — Z952 Presence of prosthetic heart valve: Secondary | ICD-10-CM

## 2023-09-15 DIAGNOSIS — Z7901 Long term (current) use of anticoagulants: Secondary | ICD-10-CM

## 2023-09-15 DIAGNOSIS — Z5181 Encounter for therapeutic drug level monitoring: Secondary | ICD-10-CM

## 2023-09-15 LAB — POCT INR: INR: 5.2 — AB (ref 2.0–3.0)

## 2023-09-15 NOTE — Patient Instructions (Signed)
 HOLD Today, Thursday and Friday then Continue 1/2 tablet every day EXCEPT 1 tablet on Mondays and Fridays.   EAT GREENS 2 DAYS PER WEEK;. [phone];  Recheck INR in 3 weeks

## 2023-09-27 ENCOUNTER — Other Ambulatory Visit: Payer: Self-pay | Admitting: Surgery

## 2023-09-27 DIAGNOSIS — I7100 Dissection of unspecified site of aorta: Secondary | ICD-10-CM

## 2023-09-28 ENCOUNTER — Telehealth: Payer: Self-pay | Admitting: *Deleted

## 2023-09-28 NOTE — Telephone Encounter (Signed)
 Patient called to report that he hit the ceiling fan and it did not bleed much. He states he is concerned that it did not bleed much. Advised that we advise with any injuries to get checked out by a provider, whether it is at an urgent care, his primary doctor or and ER as a last resort. Advised INR was up at last visit and since holding those dose his INR should have decreased but I don't know what his INR is without an INR check. He states he thinks it is fine as well and will follow up with us  as needed.

## 2023-10-06 ENCOUNTER — Ambulatory Visit

## 2023-10-06 ENCOUNTER — Ambulatory Visit: Admitting: Medical

## 2023-10-06 NOTE — Progress Notes (Deleted)
  Cardiology Office Note:  .   Date:  10/06/2023  ID:  Ryan [name], DOB Apr 12, 1966, MRN [id number] PCP: Ryan [name], Ryan [name]  [facility] Providers Cardiologist:  Ryan Crisp, MD { }   History of Present Illness: .   Ryan [name] is a 58 y.o. 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 Sherene Dilling, stroke/TIAs without residual deficits, paroxysmal Afib, HTN, HLD, and OSA who presents for 1 month follow-up.   He was admitted in mid March for new onset Afib with RVR. He was treated with rate control but ultimately ended up needing cardioversion, which was successful. He was seen in follow-up and was in NSR on metoprolol . Echo showed mildly worse EF 35-40%, mod LVH, severely dilated left atrium, mild MR, normally functioning aortic valve.  Today Fatigue and metoprolol  Low EF, stress test and GDTM  ROS: ***  Studies Reviewed: .        *** Risk Assessment/Calculations:   {Does this patient have ATRIAL FIBRILLATION?[phone]} No BP recorded.  {Refresh Note OR Click here to enter BP  :1}***       Physical Exam:   VS:  There were no vitals taken for this visit.   Wt Readings from Last 3 Encounters:  09/01/23 168 lb 9.6 oz (76.5 kg)  08/28/23 166 lb 1.6 oz (75.3 kg)  08/11/23 166 lb (75.3 kg)    GEN: Well nourished, well developed in no acute distress NECK: No JVD; No carotid bruits CARDIAC: ***RRR, no murmurs, rubs, gallops RESPIRATORY:  Clear to auscultation without rales, wheezing or rhonchi  ABDOMEN: Soft, non-tender, non-distended EXTREMITIES:  No edema; No deformity   ASSESSMENT AND PLAN: .   ***    {Are you ordering a CV Procedure (e.g. stress test, cath, DCCV, TEE, etc)?   Press F2        :[id number]}  Dispo: ***  Signed, Batya Citron Rebekah Canada, PA-C

## 2023-10-13 ENCOUNTER — Ambulatory Visit: Attending: Internal Medicine

## 2023-10-13 DIAGNOSIS — Z952 Presence of prosthetic heart valve: Secondary | ICD-10-CM | POA: Diagnosis not present

## 2023-10-13 DIAGNOSIS — Z7901 Long term (current) use of anticoagulants: Secondary | ICD-10-CM

## 2023-10-13 DIAGNOSIS — Z5181 Encounter for therapeutic drug level monitoring: Secondary | ICD-10-CM | POA: Diagnosis not present

## 2023-10-13 LAB — POCT INR: INR: 2.7 (ref 2.0–3.0)

## 2023-10-13 NOTE — Patient Instructions (Signed)
 Continue 1/2 tablet every day EXCEPT 1 tablet on Mondays and Fridays.   EAT GREENS 2 DAYS PER WEEK;. [phone];  Recheck INR in 6 weeks

## 2023-10-20 ENCOUNTER — Ambulatory Visit: Attending: Medical | Admitting: Medical

## 2023-10-20 ENCOUNTER — Encounter: Payer: Self-pay | Admitting: Medical

## 2023-10-20 VITALS — BP 106/58 | HR 62 | Ht 67.0 in | Wt 166.0 lb

## 2023-10-20 DIAGNOSIS — E782 Mixed hyperlipidemia: Secondary | ICD-10-CM | POA: Diagnosis not present

## 2023-10-20 DIAGNOSIS — Z79899 Other long term (current) drug therapy: Secondary | ICD-10-CM

## 2023-10-20 DIAGNOSIS — I480 Paroxysmal atrial fibrillation: Secondary | ICD-10-CM

## 2023-10-20 DIAGNOSIS — I5022 Chronic systolic (congestive) heart failure: Secondary | ICD-10-CM | POA: Diagnosis not present

## 2023-10-20 DIAGNOSIS — I10 Essential (primary) hypertension: Secondary | ICD-10-CM
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
## 2023-10-20 NOTE — Patient Instructions (Signed)
 Medication Instructions:  Your Physician recommend you continue on your current medication as directed.    *If you need a refill on your cardiac medications before your next appointment, please call your pharmacy*  Lab Work: No labs ordered today  If you have labs (blood work) drawn today and your tests are completely normal, you will receive your results only by: MyChart Message (if you have MyChart) OR A paper copy in the mail If you have any lab test that is abnormal or we need to change your treatment, we will call you to review the results.  Testing/Procedures:   Please report to Radiology at [facility] Main Entrance, medical mall, 30 mins prior to your test.  [address]  [place], [place]  How to Prepare for Your Cardiac PET/CT Stress Test:  Nothing to eat or drink, except water, 3 hours prior to arrival time.  NO caffeine/decaffeinated products, or chocolate 12 hours prior to arrival. (Please note decaffeinated beverages (teas/coffees) still contain caffeine).  If you have caffeine within 12 hours prior, the test will need to be rescheduled.  Medication instructions: Do not take erectile dysfunction medications for 72 hours prior to test (sildenafil, tadalafil) Do not take nitrates (isosorbide mononitrate, Ranexa) the day before or day of test  You may take your remaining medications with water.  NO perfume, cologne or lotion on chest or abdomen area.  Total time is 1 to 2 hours; you may want to bring reading material for the waiting time.  In preparation for your appointment, medication and supplies will be purchased.  Appointment availability is limited, so if you need to cancel or reschedule, please call the Radiology Department Scheduler at [phone] 24 hours in advance to avoid a cancellation fee of $100.00  What to Expect When you Arrive:  Once you arrive and check in for your appointment, you will be taken to a preparation room  within the Radiology Department.  A technologist or Nurse will obtain your medical history, verify that you are correctly prepped for the exam, and explain the procedure.  Afterwards, an IV will be started in your arm and electrodes will be placed on your skin for EKG monitoring during the stress portion of the exam. Then you will be escorted to the PET/CT scanner.  There, staff will get you positioned on the scanner and obtain a blood pressure and EKG.  During the exam, you will continue to be connected to the EKG and blood pressure machines.  A small, safe amount of a radioactive tracer will be injected in your IV to obtain a series of pictures of your heart along with an injection of a stress agent.    After your Exam:  It is recommended that you eat a meal and drink a caffeinated beverage to counter act any effects of the stress agent.  Drink plenty of fluids for the remainder of the day and urinate frequently for the first couple of hours after the exam.  Your doctor will inform you of your test results within 7-10 business days.  For more information and frequently asked questions, please visit our website: [email or web address]  For questions about your test or how to prepare for your test, please call: Cardiac Imaging Nurse Navigators Office: [phone]   Follow-Up: At [facility], you and your health needs are our priority.  As part of our continuing mission to provide you with exceptional heart care, our providers are all part of one team.  This team  includes your primary Cardiologist (physician) and Advanced Practice Providers or APPs (Physician Assistants and Nurse Practitioners) who all work together to provide you with the care you need, when you need it.  Your next appointment:   3 month(s)  Provider:   You may see Sammy Crisp, MD or one of the following Advanced Practice Providers on your designated Care Team:   Laneta Pintos, NP Gildardo Labrador,  PA-C Varney Gentleman, PA-C Cadence [place], PA-C Ronald Cockayne, NP Morey Ar, NP    We recommend signing up for the patient portal called "MyChart".  Sign up information is provided on this After Visit Summary.  MyChart is used to connect with patients for Virtual Visits (Telemedicine).  Patients are able to view lab/test results, encounter notes, upcoming appointments, etc.  Non-urgent messages can be sent to your provider as well.   To learn more about what you can do with MyChart, go to [email or web address].

## 2023-10-26 ENCOUNTER — Other Ambulatory Visit: Payer: Self-pay | Admitting: Medical

## 2023-10-26 ENCOUNTER — Encounter: Payer: Self-pay | Admitting: Internal Medicine

## 2023-10-26 MED ORDER — LISINOPRIL 2.5 MG PO TABS: 2.5000 mg | ORAL_TABLET | Freq: Every day | ORAL | 3 refills | Status: AC

## 2023-11-02 ENCOUNTER — Other Ambulatory Visit: Payer: Self-pay | Admitting: Surgery

## 2023-11-02 DIAGNOSIS — I7100 Dissection of unspecified site of aorta: Secondary | ICD-10-CM

## 2023-11-03 ENCOUNTER — Other Ambulatory Visit

## 2023-11-04 ENCOUNTER — Encounter (HOSPITAL_COMMUNITY): Payer: Self-pay

## 2023-11-04 ENCOUNTER — Ambulatory Visit (HOSPITAL_COMMUNITY)
Admission: RE | Admit: 2023-11-04 | Discharge: 2023-11-04 | Disposition: A | Discharge status: Home / Self Care | Payer: PRIVATE HEALTH INSURANCE | Source: Ambulatory Visit | Attending: Cardiovascular Disease | Admitting: Cardiovascular Disease

## 2023-11-04 ENCOUNTER — Other Ambulatory Visit: Payer: Self-pay

## 2023-11-04 DIAGNOSIS — Z91041 Radiographic dye allergy status: Secondary | ICD-10-CM

## 2023-11-04 DIAGNOSIS — I7100 Dissection of unspecified site of aorta: Secondary | ICD-10-CM

## 2023-11-04 MED ORDER — PREDNISONE 50 MG PO TABS: ORAL_TABLET | ORAL | 1 refills | Status: DC

## 2023-11-09 ENCOUNTER — Ambulatory Visit (HOSPITAL_COMMUNITY)
Admission: RE | Admit: 2023-11-09 | Discharge: 2023-11-09 | Disposition: A | Discharge status: Home / Self Care | Payer: PRIVATE HEALTH INSURANCE | Source: Ambulatory Visit | Attending: Surgery | Admitting: Surgery

## 2023-11-09 DIAGNOSIS — I7100 Dissection of unspecified site of aorta: Secondary | ICD-10-CM | POA: Insufficient documentation

## 2023-11-09 MED ORDER — IOHEXOL 350 MG/ML SOLN
100.0000 mL | Freq: Once | INTRAVENOUS | Status: AC | PRN
  Administered 2023-11-09: 100 mL via INTRAVENOUS

## 2023-11-10 ENCOUNTER — Ambulatory Visit: Payer: No Typology Code available for payment source | Admitting: Nurse Practitioner

## 2023-11-10 ENCOUNTER — Ambulatory Visit: Admitting: Surgery

## 2023-11-24 ENCOUNTER — Ambulatory Visit: Payer: PRIVATE HEALTH INSURANCE | Attending: Internal Medicine

## 2023-11-24 DIAGNOSIS — Z5181 Encounter for therapeutic drug level monitoring: Secondary | ICD-10-CM

## 2023-11-24 DIAGNOSIS — Z952 Presence of prosthetic heart valve: Secondary | ICD-10-CM | POA: Diagnosis not present

## 2023-11-24 DIAGNOSIS — Z7901 Long term (current) use of anticoagulants: Secondary | ICD-10-CM

## 2023-11-24 LAB — POCT INR: INR: 3.7 — AB (ref 2.0–3.0)

## 2023-11-24 NOTE — Patient Instructions (Signed)
 Hold tonight and tomorrow then Continue 1/2 tablet every day EXCEPT 1 tablet on Mondays and Fridays.   EAT GREENS 2 DAYS PER WEEK;. [phone];  Recheck INR in 3 weeks

## 2023-12-01 ENCOUNTER — Encounter: Payer: Self-pay | Admitting: Surgery

## 2023-12-01 ENCOUNTER — Encounter (HOSPITAL_COMMUNITY): Payer: Self-pay

## 2023-12-01 ENCOUNTER — Ambulatory Visit: Payer: PRIVATE HEALTH INSURANCE | Attending: Surgery | Admitting: Surgery

## 2023-12-01 VITALS — BP 129/69 | HR 54 | Resp 18 | Ht 67.0 in | Wt 169.0 lb

## 2023-12-01 DIAGNOSIS — I7100 Dissection of unspecified site of aorta: Secondary | ICD-10-CM | POA: Diagnosis not present

## 2023-12-01 NOTE — Progress Notes (Unsigned)
   [address] [place] [address]             [phone]      HPI: ***  Current Outpatient Medications  Medication Sig Dispense Refill   acetaminophen  (TYLENOL ) 500 MG tablet Take 500 mg by mouth every 6 (six) hours as needed for headache, fever, moderate pain or mild pain.     aspirin  81 MG EC tablet Take 81 mg by mouth daily.     Ferrous Sulfate  Dried (HIGH POTENCY IRON) 65 MG TABS Take 2 tablets by mouth daily.     fluticasone  (FLONASE ) 50 MCG/ACT nasal spray Place 1 spray into both nostrils 2 (two) times daily. 16 g 0   hydrOXYzine  (VISTARIL ) 25 MG capsule Take 1 capsule (25 mg total) by mouth every 12 (twelve) hours as needed. 30 capsule 1   lisinopril  (ZESTRIL ) 2.5 MG tablet Take 1 tablet (2.5 mg total) by mouth daily. 90 tablet 3   lovastatin  (MEVACOR ) 20 MG tablet TAKE 1 TABLET BY MOUTH AT BEDTIME 90 tablet 3   meclizine  (ANTIVERT ) 50 MG tablet Take 1 tablet (50 mg total) by mouth 3 (three) times daily as needed. 30 tablet 0   Melatonin 3 MG CAPS Take by mouth.     metoprolol  tartrate (LOPRESSOR ) 25 MG tablet Take 0.5 tablets (12.5 mg total) by mouth 2 (two) times daily. 180 tablet 3   oxymetazoline  (AFRIN) 0.05 % nasal spray Place 1 spray into both nostrils 2 (two) times daily.     Potassium 99 MG TABS Take 2 tablets by mouth every evening.     predniSONE  (DELTASONE ) 50 MG tablet Take 50 mg tablet 13 hours prior to scan/then take 50 mg tablet 7 hours prior to scan/ then take 50 mg 1 hour prior to scan. 3 tablet 1   sodium chloride  (OCEAN) 0.65 % SOLN nasal spray Place 1 spray into both nostrils as needed for up to 14 days for congestion. 30 mL 0   warfarin (COUMADIN ) 5 MG tablet TAKE ONE-HALF TO ONE TABLET BY MOUTH ONCE DAILY AS INSTRUCTED BY ANTICOAGULATION CLINIC 90 tablet 0   No current facility-administered medications for this visit.     Physical Exam: ***  Diagnostic Tests: ***  Impression: ***  Plan: ***   Bartley Lightning,  MD [facility] [phone]

## 2023-12-02 ENCOUNTER — Ambulatory Visit
Admission: RE | Admit: 2023-12-02 | Discharge: 2023-12-02 | Disposition: A | Discharge status: Home / Self Care | Payer: PRIVATE HEALTH INSURANCE | Source: Ambulatory Visit | Attending: Medical | Admitting: Medical

## 2023-12-02 DIAGNOSIS — Z79899 Other long term (current) drug therapy: Secondary | ICD-10-CM

## 2023-12-02 LAB — NM PET CT CARDIAC PERFUSION MULTI W/ABSOLUTE BLOODFLOW
LV dias vol: 203 mL (ref 62–150)
LV sys vol: 128 mL (ref 4.2–5.8)
Nuc Rest EF: 34 %
Nuc Stress EF: 37 %
Peak HR: 57 {beats}/min
Rest HR: 49 {beats}/min
Rest Nuclear Isotope Dose: 19.5 mCi
SRS: 8
SSS: 17
Stress Nuclear Isotope Dose: 19.5 mCi
TID: 1.1

## 2023-12-02 MED ORDER — REGADENOSON 0.4 MG/5ML IV SOLN
INTRAVENOUS | Status: AC
  Filled 2023-12-02: qty 5

## 2023-12-02 MED ORDER — REGADENOSON 0.4 MG/5ML IV SOLN
0.4000 mg | Freq: Once | INTRAVENOUS | Status: AC
  Administered 2023-12-02: 0.4 mg via INTRAVENOUS
  Filled 2023-12-02: qty 5

## 2023-12-02 MED ORDER — RUBIDIUM RB82 GENERATOR (RUBYFILL)
25.0000 | PACK | Freq: Once | INTRAVENOUS | Status: AC
Start: 2023-12-02 — End: 2023-12-02
  Administered 2023-12-02: 19.47 via INTRAVENOUS

## 2023-12-02 MED ORDER — RUBIDIUM RB82 GENERATOR (RUBYFILL)
25.0000 | PACK | Freq: Once | INTRAVENOUS | Status: AC
Start: 2023-12-02 — End: 2023-12-02
  Administered 2023-12-02: 19.48 via INTRAVENOUS

## 2023-12-02 NOTE — Progress Notes (Signed)
 Patient presents for a cardiac PET stress test and tolerated procedure with some shortness of breath that lasted for about a minute. Patient maintained acceptable vital signs throughout the test and was offered caffeine after test.  Patient ambulated out of department with a steady gait.

## 2023-12-03 ENCOUNTER — Ambulatory Visit: Payer: Self-pay | Admitting: Medical

## 2023-12-08 ENCOUNTER — Encounter: Payer: Self-pay | Admitting: Internal Medicine

## 2023-12-15 ENCOUNTER — Ambulatory Visit: Payer: PRIVATE HEALTH INSURANCE | Attending: Internal Medicine

## 2023-12-15 DIAGNOSIS — Z952 Presence of prosthetic heart valve: Secondary | ICD-10-CM | POA: Diagnosis not present

## 2023-12-15 DIAGNOSIS — Z5181 Encounter for therapeutic drug level monitoring: Secondary | ICD-10-CM | POA: Diagnosis not present

## 2023-12-15 DIAGNOSIS — Z7901 Long term (current) use of anticoagulants: Secondary | ICD-10-CM

## 2023-12-15 LAB — POCT INR: INR: 3.7 — AB (ref 2.0–3.0)

## 2023-12-15 NOTE — Patient Instructions (Signed)
 Hold tonight and eat greens tonight and then Continue 1/2 tablet every day EXCEPT 1 tablet on Mondays and Fridays.   EAT GREENS 2 DAYS PER WEEK;. [phone]; Recheck INR in 4 weeks

## 2024-01-06 ENCOUNTER — Telehealth: Payer: Self-pay | Admitting: Internal Medicine

## 2024-01-06 NOTE — Telephone Encounter (Signed)
 Called pt and made him aware the eye drops will not affect Warfarin/INR. Pt verbalized understanding.

## 2024-01-06 NOTE — Telephone Encounter (Signed)
   Pt c/o medication issue:  1. Name of Medication: prednisolone eye drops moxifloxacin eye drops  2. How are you currently taking this medication (dosage and times per day)? As written   3. Are you having a reaction (difficulty breathing--STAT)? No   4. What is your medication issue? Pt said, he said, his eye doctor today and was given this two eye drops. He would like to confirm if this is ok to use while taking coumadin

## 2024-01-12 ENCOUNTER — Ambulatory Visit: Attending: Internal Medicine

## 2024-01-12 DIAGNOSIS — Z7901 Long term (current) use of anticoagulants: Secondary | ICD-10-CM | POA: Diagnosis not present

## 2024-01-12 DIAGNOSIS — Z952 Presence of prosthetic heart valve: Secondary | ICD-10-CM

## 2024-01-12 DIAGNOSIS — Z5181 Encounter for therapeutic drug level monitoring: Secondary | ICD-10-CM

## 2024-01-12 LAB — POCT INR: INR: 1.3 — AB (ref 2.0–3.0)

## 2024-01-12 NOTE — Patient Instructions (Signed)
 Take 1 tablet today and tomorrow then Continue 1/2 tablet every day EXCEPT 1 tablet on Mondays and Fridays.   EAT GREENS 2 DAYS PER WEEK;. [phone]; Recheck INR in 4 weeks

## 2024-02-06 ENCOUNTER — Emergency Department

## 2024-02-06 ENCOUNTER — Emergency Department
Admission: EM | Admit: 2024-02-06 | Discharge: 2024-02-06 | Disposition: A | Discharge status: Home / Self Care | Attending: Emergency Medicine | Admitting: Emergency Medicine

## 2024-02-06 ENCOUNTER — Other Ambulatory Visit: Payer: Self-pay

## 2024-02-06 DIAGNOSIS — R079 Chest pain, unspecified: Secondary | ICD-10-CM | POA: Diagnosis present

## 2024-02-06 DIAGNOSIS — I10 Essential (primary) hypertension: Secondary | ICD-10-CM | POA: Diagnosis not present

## 2024-02-06 DIAGNOSIS — Z7901 Long term (current) use of anticoagulants: Secondary | ICD-10-CM | POA: Diagnosis not present

## 2024-02-06 LAB — BASIC METABOLIC PANEL WITH GFR
Anion gap: 6 (ref 5–15)
BUN: 10 mg/dL (ref 6–20)
CO2: 25 mmol/L (ref 22–32)
Calcium: 8.6 mg/dL — ABNORMAL LOW (ref 8.9–10.3)
Chloride: 107 mmol/L (ref 98–111)
Creatinine, Ser: 0.99 mg/dL (ref 0.61–1.24)
GFR, Estimated: >60 mL/min (ref >60)
Glucose, Bld: 122 mg/dL — ABNORMAL HIGH (ref 70–99)
Potassium: 3.7 mmol/L (ref 3.5–5.1)
Sodium: 138 mmol/L (ref 135–145)

## 2024-02-06 LAB — CBC
HCT: 36.8 % — ABNORMAL LOW (ref 39.0–52.0)
Hemoglobin: 12.4 g/dL — ABNORMAL LOW (ref 13.0–17.0)
MCH: 32 pg (ref 26.0–34.0)
MCHC: 33.7 g/dL (ref 30.0–36.0)
MCV: 94.8 fL (ref 80.0–100.0)
Platelets: 164 K/uL (ref 150–400)
RBC: 3.88 MIL/uL — ABNORMAL LOW (ref 4.22–5.81)
RDW: 14.5 % (ref 11.5–15.5)
WBC: 4.6 K/uL (ref 4.0–10.5)
nRBC: 0 % (ref 0.0–0.2)

## 2024-02-06 LAB — PROTIME-INR
INR: 2.9 — ABNORMAL HIGH (ref 0.8–1.2)
Prothrombin Time: 32 s — ABNORMAL HIGH (ref 11.4–15.2)

## 2024-02-06 LAB — TROPONIN I (HIGH SENSITIVITY)
Troponin I (High Sensitivity): 24 ng/L — ABNORMAL HIGH (ref <18)
Troponin I (High Sensitivity): 23 ng/L — ABNORMAL HIGH (ref <18)

## 2024-02-06 MED ORDER — IOHEXOL 350 MG/ML SOLN
100.0000 mL | Freq: Once | INTRAVENOUS | Status: AC | PRN
  Administered 2024-02-06: 100 mL via INTRAVENOUS

## 2024-02-06 MED ORDER — METHYLPREDNISOLONE SODIUM SUCC 40 MG IJ SOLR
40.0000 mg | Freq: Once | INTRAMUSCULAR | Status: AC
  Administered 2024-02-06: 40 mg via INTRAVENOUS
  Filled 2024-02-06: qty 1

## 2024-02-06 MED ORDER — DIPHENHYDRAMINE HCL 50 MG/ML IJ SOLN
50.0000 mg | Freq: Once | INTRAMUSCULAR | Status: AC
  Administered 2024-02-06: 50 mg via INTRAVENOUS
  Filled 2024-02-06: qty 1

## 2024-02-06 MED ORDER — DIPHENHYDRAMINE HCL 25 MG PO CAPS: 50.0000 mg | ORAL_CAPSULE | Freq: Once | ORAL | Status: AC

## 2024-02-06 NOTE — ED Provider Notes (Addendum)
 [facility] Provider Note    Event Date/Time   First MD Initiated Contact with Patient 02/06/24 1826     (approximate)  History   Chief Complaint: Chest Pain  HPI  Ryan [name] is a 58 y.o. male with a past medical history of anemia, gastric reflux, hypertension, hyperlipidemia, paroxysmal atrial fibrillation on warfarin, prior CVA, known thoracic aortic aneurysm status post repair who presents to the emergency department for chest pain.  According to the patient around 3:00 he developed pain in the center of his chest associated with some mild shortness of breath.  Denies any fever cough or congestion.  Currently rates his pain as moderate.  Physical Exam   Triage Vital Signs: ED Triage Vitals  Encounter Vitals Group     BP 02/06/24 1558 129/78     Girls Systolic BP Percentile --      Girls Diastolic BP Percentile --      Boys Systolic BP Percentile --      Boys Diastolic BP Percentile --      Pulse Rate 02/06/24 1558 70     Resp 02/06/24 1558 18     Temp 02/06/24 1558 98.4 F (36.9 C)     Temp Source 02/06/24 1558 Oral     SpO2 02/06/24 1558 97 %     Weight 02/06/24 1559 170 lb (77.1 kg)     Height 02/06/24 1559 5' 8 (1.727 m)     Head Circumference --      Peak Flow --      Pain Score 02/06/24 1558 8     Pain Loc --      Pain Education --      Exclude from Growth Chart --     Most recent vital signs: Vitals:   02/06/24 1558  BP: 129/78  Pulse: 70  Resp: 18  Temp: 98.4 F (36.9 C)  SpO2: 97%    General: Awake, no distress.  Well-appearing, playing on his cell phone. CV:  Good peripheral perfusion.  Regular rate and rhythm  Resp:  Normal effort.  Equal breath sounds bilaterally.  Abd:  No distention.  Soft, nontender.  No rebound or guarding.   ED Results / Procedures / Treatments   EKG  EKG viewed and interpreted by myself shows a normal sinus rhythm at 67 bpm with a widened QRS, left axis deviation, morphology most consistent  with left bundle branch block, nonspecific ST changes largely unchanged from prior EKG.  RADIOLOGY  I have reviewed interpreted chest x-ray images.  No obvious consolidation on my evaluation, sternotomy wires and likely valve replacement present. CTA shows no acute changes.  MEDICATIONS ORDERED IN ED: Medications  methylPREDNISolone  sodium succinate  (SOLU-MEDROL ) 40 mg/mL injection 40 mg (has no administration in time range)  diphenhydrAMINE  (BENADRYL ) capsule 50 mg (has no administration in time range)    Or  diphenhydrAMINE  (BENADRYL ) injection 50 mg (has no administration in time range)     IMPRESSION / MDM / ASSESSMENT AND PLAN / ED COURSE  I reviewed the triage vital signs and the nursing notes.  Patient's presentation is most consistent with acute presentation with potential threat to life or bodily function.  Patient with a known history of a prior thoracic aortic aneurysm with repair as well as a valve replacement who presents to the emergency department for chest pain starting around 3 PM today.  Continues to have moderate chest discomfort although patient appears well and is in no distress.  EKG is  unchanged, chest x-ray shows no concerning finding.  Patient's CBC is normal, chemistry shows no concerning finding and troponin is largely unchanged from the patient's historical values.  Will repeat a troponin to ensure that there is no elevation.  Given the patient's known thoracic aneurysm and chest pain we will proceed with a CTA of the chest to further evaluate.  Patient does state he has had a rash with iodine contrast in the past but has done well with steroid/Benadryl  prep.  We will perform a prep prior to CT imaging.  Repeat troponin is unchanged.  INR of 2.9.  CTA pending.  If CTA is negative anticipate likely discharge home.  CTA has resulted showing no significant finding, no acute change.  Patient states he feels a little jittery from the Benadryl  but otherwise feels well  denies any chest pain or shortness of breath.  Will discharge with outpatient follow-up.  Patient agreeable to plan.  FINAL CLINICAL IMPRESSION(S) / ED DIAGNOSES   Chest pain   Note:  This document was prepared using Dragon voice recognition software and may include unintentional dictation errors.   Dorothyann Drivers, MD 02/06/24 2228    Dorothyann Drivers, MD 02/06/24 [phone]

## 2024-02-06 NOTE — Discharge Instructions (Signed)
 Please follow-up with your doctor/cardiologist as soon as possible regarding today's ER visit.  Return to the emergency department for any return of your chest pain, any shortness of breath or any other symptom personally concerning to yourself.

## 2024-02-06 NOTE — ED Triage Notes (Signed)
 Pt presents to the ED via [facility] from home with left sided chest pain that started around 1445. Pt reports this pain to feel like a pressure. 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 Pt received 1 spray of nitroglycerin  with EMS. Pt denies relief.

## 2024-02-09 ENCOUNTER — Ambulatory Visit (INDEPENDENT_AMBULATORY_CARE_PROVIDER_SITE_OTHER)

## 2024-02-09 ENCOUNTER — Encounter: Payer: Self-pay | Admitting: Cardiology

## 2024-02-09 ENCOUNTER — Ambulatory Visit: Admitting: Medical

## 2024-02-09 ENCOUNTER — Ambulatory Visit: Attending: Cardiology | Admitting: Cardiology

## 2024-02-09 VITALS — BP 116/64 | HR 68 | Ht 67.5 in | Wt 175.0 lb

## 2024-02-09 DIAGNOSIS — Z952 Presence of prosthetic heart valve: Secondary | ICD-10-CM

## 2024-02-09 DIAGNOSIS — Z5181 Encounter for therapeutic drug level monitoring: Secondary | ICD-10-CM | POA: Diagnosis not present

## 2024-02-09 DIAGNOSIS — Z7901 Long term (current) use of anticoagulants: Secondary | ICD-10-CM | POA: Diagnosis not present

## 2024-02-09 DIAGNOSIS — I480 Paroxysmal atrial fibrillation: Secondary | ICD-10-CM | POA: Diagnosis not present

## 2024-02-09 DIAGNOSIS — R002 Palpitations: Secondary | ICD-10-CM

## 2024-02-09 DIAGNOSIS — I5022 Chronic systolic (congestive) heart failure: Secondary | ICD-10-CM | POA: Diagnosis not present

## 2024-02-09 DIAGNOSIS — R0789 Other chest pain: Secondary | ICD-10-CM | POA: Diagnosis not present

## 2024-02-09 DIAGNOSIS — I10 Essential (primary) hypertension: Secondary | ICD-10-CM

## 2024-02-09 DIAGNOSIS — I25118 Atherosclerotic heart disease of native coronary artery with other forms of angina pectoris: Secondary | ICD-10-CM

## 2024-02-09 DIAGNOSIS — E782 Mixed hyperlipidemia: Secondary | ICD-10-CM

## 2024-02-09 LAB — POCT INR: INR: 3.8 — AB (ref 2.0–3.0)

## 2024-02-09 NOTE — Progress Notes (Signed)
 Cardiology Office Note   Date:  02/09/2024  ID:  [name], [name] Jul 09, 1965, MRN [id number] PCP: Melvin Pao, NP  [facility] Providers Cardiologist:  Lonni Hanson, MD     History of Present Illness Ryan [name] is a 58 y.o. 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   Left heart catheterization completed 07/2020 in the setting of abnormal stress test which showed normal coronary arteries.  Echo in 08/2021 demonstrated an EF of 50 to 55%, no RWMA, mild LVH, G2 DD.  Normal RV systolic function with mildly enlarged ventricular cavity size, mildly dilated right atrium, mild mitral regurgitation, prior replacement aortic valve with a mean gradient of 9 mmHg.  Stress testing 11/2021 was abnormal with a large, fixed defect involving inferior and inferior lateral walls most consistent with a scar, but cannot rule out an element of artifact.  There was not no evidence of significant ischemia.  He underwent cholecystectomy in 01/2022 reported improvement in his chronic chest pain up until an episode in 06/2022.  He has a history of chronic atypical chest pain that was admitted to the hospital/2020 with an episode occurring while riding his bike.  Echo at that time showed an EF of 40-45%, moderate LVH, normal RV systolic function ventricular, mildly dilated left atrium, mildly delayed right atrium, mild MR, prior replacement aortic valve which revealed aortic insufficiency with no evidence of aortic stenosis.  Coronary CTA 5/22 impression  coronary calcium score 159 which was the 85th percentile.  There was minimal, less than 25% stenosis in the LAD, left circumflex and the RCA.  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  He was admitted to [facility] in 09/02/2023 with sudden onset tachypalpitations while watching TV described as hearing his heartbeat in his ears with an abrasion in his chest.  Home heart monitor showed heart rates in the 160 bpm.  With some associated right-sided chest discomfort that radiated to his back.  In the send he presented to the [facility] emergency department and was noted to be in atrial fibs/atrial flutter with RVR with ventricular rates about 60 bpm.  He was hypotensive with a blood pressure of 93/52.  Afebrile.  Labs notable for initial high-sensitivity troponin of 34 with a delta troponin of 38, BNP 213, potassium 3.0 to 3.9, INR 2.6, hemoglobin 12.2.  Chest x-ray concerning for pulmonary vascular congestion as well as enlargement of the aortic knob compatible with known aneurysm.  CT of the chest showed minimal increase in the diameter of the aneurysmal sac in the thoracic aorta with no significant interval change.  In the ED was treated with IV diltiazem  10 mg followed by initiation of diltiazem  drip.  He became hypotensive leading to the discontinuation of diltiazem  drip.  He was also given potassium replacements.  Coronary CTA in 5/24 showed coronary cancer score 159 which was 85 percentile.  There was minimal, less than 25% stenosis in the LAD, left circumflex, and RCA.  Aortic root aneurysm was stable when compared to prior  imaging with descending thoracic aortic aneurysm measuring up to 51 mm with chronic dissection and thrombosed false lumen.  He was admitted to [facility] 08/2023  with tachypalpitations found to have rapid fib/flutter started on IV dilt. He underwent successful TEE/DCCV. At  follow-up he was in sinus bradycardia with a HR of 48bpm and metoprolol  was decreased. Echo was updated which showed LVEF 35-40%, mod LVH, mild MR, severely dilated LA.    He was last seen in clinic 10/20/2023.  Overall he was doing well from the cardiac perspective.  He was continued on his current medication regimen without changes made.  He was ordered a cardiac PET stress test.  Cardiac PET stress revealed LV perfusion was normal without evidence of ischemia but there was evidence of infarction.  Study is considered indeterminate risk.  No extracardiac findings were present.  He presented to the [facility] emergency department on 02/02/2024 with complaint of chest pain.  He stated it was a center of his chest he had some associated mild shortness of breath.  No vitals were considered stable.  EKG was unchanged.  He was treated with 40 mg of Solu-Medrol  and 50 mg of Benadryl .  EKG was unchanged, chest x-ray showed no concerning findings.  CBC was normal, chemistry showed no concerning findings and troponin was largely  unchanged from prior studies.  Given the patient's known thoracic aneurysm and chest pain he was sent for CTA of the chest to further evaluate.  CTA showed no significant findings and no acute change.  He was considered stable and was discharged after symptom resolution with Solu-Medrol  and Benadryl .  He returns to clinic today stating that he has been doing well since he left the emergency department.  He denies any further chest discomfort or shortness of breath.  He states that he has been having increased bouts of palpitations but is also under a large amount of stress at home.  His emergency department visit was unrevealing with flat troponins and no EKG changes.  He states that he has been compliant with his current medication regimen without any undue side effects.  ROS: 10 point review of systems were reviewed and considered negative except ones been listed in the HPI  Studies  Reviewed EKG Interpretation Date/Time:  Wednesday February 09 2024 13:45:19 EDT Ventricular Rate:  68 PR Interval:    QRS Duration:  154 QT Interval:  446 QTC Calculation: 474 R Axis:   18  Text Interpretation: Sinus rhythm Premature atrial complexes Left bundle branch block When compared with ECG of 06-Feb-2024 22:30, PREVIOUS ECG IS PRESENT Confirmed by Gerard Frederick ([id number]) on 02/09/2024 9:00:18 PM    cCTA 12/02/2023   LV perfusion is abnormal. There is no evidence of ischemia. There is evidence of infarction. Defect 1: There is a large defect with severe reduction in uptake present in the mid to basal anterolateral, inferior and inferolateral location(s) that is fixed. There is abnormal wall motion in the defect area. Consistent with infarction.   Rest left ventricular function is abnormal. Rest global function is moderately reduced. There was a single regional abnormality. Rest EF: 34%. Stress left ventricular function is abnormal. Stress global function is moderately reduced. There was a single regional abnormality. Stress EF: 37%. End diastolic cavity size is severely enlarged.   Myocardial blood flow reserve is not reported in this patient due to technical or patient-specific concerns that affect accuracy.   Coronary calcium was present on the attenuation correction CT images. Mild coronary calcifications were present. Coronary  calcifications were present in the left anterior descending artery and right coronary artery distribution(s).   Findings are consistent with infarction. The study is intermediate risk.  Cath/PCI: LHC (07/22/2020, [facility]): LMCA normal.  LAD normal.  LCx normal.  Dominant RCA normal.   CV Surgery: Aortic root repair and mechanical AVR (2001)   EP Procedures and Devices: None   Non-Invasive Evaluation(s): Coronary CTA (10/23/2022): Score 159 which was the 85th percentile, minimal CAD with less than 25% stenosis in the LAD, LCx, and RCA.  Asymmetric aortic  root aneurysm measuring 58 mm x 47 mm (stable when compared to prior CTAs in 2023 and 2024), status post ascending aortic repair, descending thoracic aortic aneurysm of up to 51 mm with chronic dissection and thrombosed false lumen. TTE (10/12/2022): EF of 40 to 45%, moderate LVH, indeterminate LV diastolic function parameters, normal RV systolic function and ventricular cavity size, moderately dilated left atrium, mildly dilated right atrium, mild mitral valve regurgitation, prior replacement of the aortic valve with trivial regurgitation and no evidence of stenosis with an aortic valve mean gradient of 9.5 mmHg. Pharmacologic MPI (12/12/2021): Abnormal, potentially high risk pharmacologic myocardial perfusion stress test.  There is a large, fixed defect involving the inferior and inferolateral walls most consistent with scar but cannot rule out an element of artifact.  No significant ischemia identified.  LVEF 50% by Siemens calculation.  Transient ischemic dilation noted (TID1.35). CTA chest (09/28/2021): Complex aortic dissection with aortic valve replacement.  Dilated aortic root measuring up to 5.1 cm, aortic arch measuring up to 4.6 cm and descending aorta measuring up to 5.4 cm.  Dissections extend into all great vessels except for the left common carotid artery.  Duplicated SVC noted. TTE (08/27/2021): Normal LV size with mild LVH.  LVEF 50-55% with grade 2 diastolic dysfunction.  Mildly dilated RV with normal contraction.  Mild right atrial enlargement.  Mild mitral regurgitation.  Mechanical aortic valve present with mean gradient 7 mmHg.  Normal CVP. CTA chest, abdomen, and pelvis (04/25/2021, [facility]): Patient status post AVR and ascending aortic graft.  Residual dissection in the arch to the level of the bilateral common iliac arteries.  Aortic root measures up to 5.0 cm.  Arch and descending aorta measure up to 4.8 cm.  Findings are stable from prior study in 01/2021. TTE (02/05/2021, [facility]): Technically difficult study.  Mildly dilated left ventricle with LVEF 55-60% with abnormal septal motion.  Moderate left atrial enlargement.  Normal mechanical AVR gradient (mean gradient 7 mmHg) with mild regurgitation.  Risk Assessment/Calculations  CHA2DS2-VASc Score = 4   This indicates a 4.8% annual risk of stroke. The patient's score is based upon: CHF History: 0 HTN History: 1 Diabetes History: 0 Stroke History: 2 Vascular Disease History: 1 Age Score: 0 Gender Score: 0            Physical Exam VS:  BP 116/64 (BP Location: Left Arm, Patient Position: Sitting, Cuff Size: Normal)   Pulse 68   Ht 5' 7.5 (1.715 m)   Wt 175 lb (79.4 kg)   SpO2 96%   BMI 27.00 kg/m        Wt Readings from Last 3 Encounters:  02/09/24 175 lb (79.4 kg)  02/06/24 170 lb (77.1 kg)  12/01/23 169 lb (76.7 kg)    GEN: Well nourished, well developed in no acute distress NECK: No JVD; No carotid bruits CARDIAC: RR, no murmurs, click from mechanical valve without rubs or gallops RESPIRATORY:  Clear to  auscultation without rales, wheezing or rhonchi  ABDOMEN: Soft, non-tender, non-distended EXTREMITIES:  No edema; No deformity   ASSESSMENT AND PLAN Palpitations that have been worsening over the last several weeks.  Labs reviewed from the emergency department were stable.  EKG today reveals increased amount of PACs.  He has been placed on a ZIO XT monitor for 14 days to rule out arrhythmia, pauses, or assess burden of ectopy.  Nonobstructive coronary artery disease with recent urgency department visit for atypical chest pain.  High-sensitivity troponins trended flat and unchanged and EKG was found to be nonacute.  After patient was treated with Benadryl  and IV Solu-Medrol  to have a chest CTA to rule out dissection his symptoms were resolved.  He thinks that his atypical chest pain that he had gone to the emergency department for was stress induced.  He is continued on aspirin  81 mg daily and  lovastatin  20 mg daily no further ischemic evaluation at this time.  Just recently underwent coronary CTA which showed LV perfusion was abnormal there was no evidence of ischemia but evidence of infarct.  Paroxysmal atrial fibrillation where he was noted to be sinus rhythm with an increase in PACs today with a rate of 68 and a chronic left bundle branch block.  He is continued on metoprolol  12.5 mg twice daily and Coumadin  per Coumadin  clinic.  He maintains on OAC for CHA2DS2-VASc score of at least 4 for stroke prophylaxis.  He denies any issues with bleeding with no blood noted in his urine or stool.  Chronic HFrEF with LVEF on recent echocardiogram 30-35%.  Reduced EF possibly tachycardia mediated.  He denies any chest pain or shortness of breath.  Cardiac PET stress was just completed which revealed infarct without ischemia.  He is continued on lisinopril  2.5 mg daily metoprolol  tartrate 12.5 mg twice daily.  Discussed escalation of GDMT with medication changes deferred until return after his monitor has resulted.  Hypertension with a blood pressure today 116/64.  Blood pressures remain stable.  He has continued on lisinopril  2.5 mg daily and metoprolol  tartrate 12.5 mg twice daily.  He has been encouraged to continue to monitor his blood pressure 1 to 2 hours postmedication administration at home as well.  Hyperlipidemia with an LDL of 37.  Lipids have been well-controlled.  He has been continued on lovastatin  20 mg daily.  Status post AVR with mechanical valve.  Continued on warfarin per Coumadin  clinic.  Continue with surveillance studies and SBE prophylaxis.       Dispo: Patient to return to clinic to see MD/APP in 3 months or sooner if needed for further evaluation  Signed, Arleen Bar, NP

## 2024-02-09 NOTE — Patient Instructions (Signed)
 Hold today and tomorrow then  Continue 1/2 tablet every day EXCEPT 1 tablet on Mondays and Fridays.   EAT GREENS 2 DAYS PER WEEK;. [phone]; Recheck INR in 4 weeks

## 2024-02-09 NOTE — Patient Instructions (Signed)
 Medication Instructions:   Your physician recommends that you continue on your current medications as directed. Please refer to the Current Medication list given to you today.  *If you need a refill on your cardiac medications before your next appointment, please call your pharmacy*  Lab Work:  No labs ordered today   If you have labs (blood work) drawn today and your tests are completely normal, you will receive your results only by: MyChart Message (if you have MyChart) OR A paper copy in the mail If you have any lab test that is abnormal or we need to change your treatment, we will call you to review the results.  Testing/Procedures:  Your physician has recommended that you wear a Zio monitor.   This monitor is a medical device that records the heart's electrical activity. Doctors most often use these monitors to diagnose arrhythmias. Arrhythmias are problems with the speed or rhythm of the heartbeat. The monitor is a small device applied to your chest. You can wear one while you do your normal daily activities. While wearing this monitor if you have any symptoms to push the button and record what you felt. Once you have worn this monitor for the period of time provider prescribed (Usually 14 days), you will return the monitor device in the postage paid box. Once it is returned they will download the data collected and provide us  with a report which the provider will then review and we will call you with those results. Important tips:  Avoid showering during the first 24 hours of wearing the monitor. Avoid excessive sweating to help maximize wear time. Do not submerge the device, no hot tubs, and no swimming pools. Keep any lotions or oils away from the patch. After 24 hours you may shower with the patch on. Take brief showers with your back facing the shower head.  Do not remove patch once it has been placed because that will interrupt data and decrease adhesive wear time. Push the  button when you have any symptoms and write down what you were feeling. Once you have completed wearing your monitor, remove and place into box which has postage paid and place in your outgoing mailbox.  If for some reason you have misplaced your box then call our office and we can provide another box and/or mail it off for you.   Follow-Up: At [facility], you and your health needs are our priority.  As part of our continuing mission to provide you with exceptional heart care, our providers are all part of one team.  This team includes your primary Cardiologist (physician) and Advanced Practice Providers or APPs (Physician Assistants and Nurse Practitioners) who all work together to provide you with the care you need, when you need it.  Your next appointment:   6 week(s)  Provider:    Tylene Lunch, NP

## 2024-02-11 ENCOUNTER — Other Ambulatory Visit: Payer: Self-pay | Admitting: Internal Medicine

## 2024-02-11 DIAGNOSIS — I480 Paroxysmal atrial fibrillation: Secondary | ICD-10-CM

## 2024-03-07 ENCOUNTER — Ambulatory Visit: Payer: Self-pay | Admitting: Cardiology

## 2024-03-07 DIAGNOSIS — R002 Palpitations: Secondary | ICD-10-CM

## 2024-03-07 NOTE — Progress Notes (Signed)
 Average heart rate of 66 bpm, episodes of nonsustained ventricular tachycardia lasting up to 9.4 seconds, 165 supraventricular runs lasting up to 14.9 seconds, predominantly sinus rhythm with multiple episodes of ventricular or supraventricular tachycardia.  Recommend referral to EP.

## 2024-03-08 ENCOUNTER — Ambulatory Visit

## 2024-03-22 ENCOUNTER — Ambulatory Visit: Attending: Internal Medicine

## 2024-03-22 DIAGNOSIS — Z5181 Encounter for therapeutic drug level monitoring: Secondary | ICD-10-CM

## 2024-03-22 DIAGNOSIS — Z7901 Long term (current) use of anticoagulants: Secondary | ICD-10-CM

## 2024-03-22 DIAGNOSIS — Z952 Presence of prosthetic heart valve: Secondary | ICD-10-CM | POA: Diagnosis not present

## 2024-03-22 LAB — POCT INR: INR: 2 (ref 2.0–3.0)

## 2024-03-22 NOTE — Patient Instructions (Signed)
 Take 1 tablet today only  then Continue 1/2 tablet every day EXCEPT 1 tablet on Mondays and Fridays.   EAT GREENS 2 DAYS PER WEEK;. [phone]; Recheck INR in 3 weeks

## 2024-03-29 ENCOUNTER — Ambulatory Visit: Attending: Cardiology | Admitting: Cardiology
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
## 2024-04-03 ENCOUNTER — Encounter: Payer: Self-pay | Admitting: Cardiology

## 2024-04-03 ENCOUNTER — Ambulatory Visit: Attending: Cardiology | Admitting: Cardiology

## 2024-04-03 VITALS — BP 100/60 | HR 46 | Ht 67.0 in | Wt 172.4 lb

## 2024-04-03 DIAGNOSIS — I25118 Atherosclerotic heart disease of native coronary artery with other forms of angina pectoris: Secondary | ICD-10-CM | POA: Diagnosis not present

## 2024-04-03 DIAGNOSIS — R002 Palpitations: Secondary | ICD-10-CM

## 2024-04-03 DIAGNOSIS — E782 Mixed hyperlipidemia: Secondary | ICD-10-CM

## 2024-04-03 DIAGNOSIS — I480 Paroxysmal atrial fibrillation: Secondary | ICD-10-CM

## 2024-04-03 DIAGNOSIS — I10 Essential (primary) hypertension: Secondary | ICD-10-CM

## 2024-04-03 DIAGNOSIS — Z952 Presence of prosthetic heart valve: Secondary | ICD-10-CM

## 2024-04-03 DIAGNOSIS — R001 Bradycardia, unspecified: Secondary | ICD-10-CM

## 2024-04-03 DIAGNOSIS — I4729 Other ventricular tachycardia: Secondary | ICD-10-CM

## 2024-04-03 DIAGNOSIS — I5022 Chronic systolic (congestive) heart failure: Secondary | ICD-10-CM | POA: Diagnosis not present

## 2024-04-03 DIAGNOSIS — I471 Supraventricular tachycardia, unspecified: Secondary | ICD-10-CM
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
## 2024-04-03 NOTE — Patient Instructions (Signed)
 Medication Instructions:  Your physician recommends the following medication changes.  DECREASE: Lopressor  to 12.5 mg daily  *If you need a refill on your cardiac medications before your next appointment, please call your pharmacy*  Lab Work: No labs ordered today  If you have labs (blood work) drawn today and your tests are completely normal, you will receive your results only by: MyChart Message (if you have MyChart) OR A paper copy in the mail If you have any lab test that is abnormal or we need to change your treatment, we will call you to review the results.  Testing/Procedures: No test ordered today   Follow-Up: At [facility], you and your health needs are our priority.  As part of our continuing mission to provide you with exceptional heart care, our providers are all part of one team.  This team includes your primary Cardiologist (physician) and Advanced Practice Providers or APPs (Physician Assistants and Nurse Practitioners) who all work together to provide you with the care you need, when you need it.  Your next appointment:   2 month(s)  Provider:   You may see Lonni Hanson, MD or one of the following Advanced Practice Providers on your designated Care Team:   Lonni Meager, NP Lesley Maffucci, PA-C Bernardino Bring, PA-C Cadence [place], PA-C Tylene Lunch, NP Barnie Hila, NP

## 2024-04-07 ENCOUNTER — Telehealth: Payer: Self-pay | Admitting: *Deleted

## 2024-04-07 NOTE — Telephone Encounter (Signed)
 Called patient after receiving a voicemail to call him back. There was no answer so left a message.

## 2024-04-12 ENCOUNTER — Institutional Professional Consult (permissible substitution): Admitting: Cardiology

## 2024-04-12 ENCOUNTER — Ambulatory Visit: Attending: Internal Medicine

## 2024-04-12 DIAGNOSIS — Z7901 Long term (current) use of anticoagulants: Secondary | ICD-10-CM | POA: Diagnosis not present

## 2024-04-12 DIAGNOSIS — Z952 Presence of prosthetic heart valve: Secondary | ICD-10-CM

## 2024-04-12 DIAGNOSIS — Z5181 Encounter for therapeutic drug level monitoring: Secondary | ICD-10-CM | POA: Diagnosis not present

## 2024-04-12 LAB — POCT INR: INR: 3.3 — AB (ref 2.0–3.0)

## 2024-04-12 NOTE — Patient Instructions (Signed)
 Continue 1/2 tablet every day EXCEPT 1 tablet on Mondays and Fridays.   EAT GREENS 2 DAYS PER WEEK;. [phone];  Eat greens tonight. Recheck INR in 3 weeks

## 2024-04-24 NOTE — Progress Notes (Signed)
 Electrophysiology Office Note:   Date:  04/26/2024  ID:  Ryan [name], DOB [birth year], MRN [id number]  Primary Cardiologist: Ryan Hanson, MD Electrophysiologist: Ryan Kitty, MD      History of Present Illness:   Ryan [name] is a 58 y.o. 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  Discussed the use of AI scribe software for clinical note transcription with the patient, who gave verbal consent to proceed.  History of Present Illness Ryan [name] is a 58 year old male with atrial flutter and ventricular tachycardia who presents with heart fluttering and skipping. He was referred by Ryan Lunch, NP for evaluation of heart fluttering and skipping.  He experiences heart fluttering and skipping, described as 'boom, boom, boom, boom, boom, boom, boom.' These symptoms have not been felt in a while, but previously, he experienced episodes where his heart felt out of rhythm and got 'stuck there.' In March, he had an episode with a heart rate of 150 beats per minute and high blood pressure, leading to an EMS call and hospital visit after 15 minutes of persistent symptoms. He experiences these symptoms approximately twice a week, but not daily.  He is currently taking metoprolol , 25 mg once a day, changed from 12.5 mg twice a day. He has a history of a PET scan showing some scarring in the heart, and was previously told his heart pumping function was not normal, with an ejection fraction reported between 35-40%.  No chest pain, passing out, or trouble breathing. He reports occasional dizziness or  lightheadedness when standing up too fast, attributed to vertigo. He works 48 hours a week and commutes on a moped.    Review of systems complete and found to be negative unless listed in HPI.   EP Information / Studies Reviewed:    EKG is not ordered today. EKG from 04/03/24 reviewed which showed SR with LBBB, PR and QRS .     EKG 08/26/23: Atrial flutter   EKG 08/25/23:    Risk Assessment/Calculations:    CHA2DS2-VASc Score = 4   This indicates a 4.8% annual risk of stroke. The patient's score is based upon: CHF History: 0 HTN History: 1 Diabetes History: 0 Stroke History: 2 Vascular Disease History: 1 Age Score: 0 Gender Score: 0             Physical Exam:   VS:  BP (!) 112/58 (BP Location: Left Arm, Patient Position: Sitting, Cuff Size: Normal)   Pulse 76   Ht 5' 7 (1.702 m)   Wt 178 lb 3.2 oz (80.8 kg)   SpO2 98%   BMI 27.91 kg/m    Wt Readings from Last 3 Encounters:  04/25/24 178 lb 3.2 oz (80.8 kg)  04/03/24 172 lb 6.4 oz (78.2 kg)  02/09/24 175 lb (79.4 kg)    General: Well developed, in no acute distress.  Neck: No JVD.  Cardiac: Normal rate, regular rhythm.  Resp: Normal work of breathing.  Ext: No edema.  Neuro: No gross focal deficits.  Psych: Normal affect.   ASSESSMENT AND PLAN:    #Atrial flutter: Symptomatic. Given history of low LVEF, we have prioritized a rhythm control strategy.  #  Paroxysmal atrial fibrillation: -Discussed treatment options today for AF and AFL including antiarrhythmic drug therapy and ablation. Discussed risks, recovery and likelihood of success with each treatment strategy. Risk, benefits, and alternatives to EP study and ablation for AF and AFL were discussed. These risks include but are not limited to stroke, bleeding, vascular damage, tamponade, perforation, damage to the esophagus, lungs, phrenic nerve and other structures, pulmonary vein stenosis, worsening renal function, coronary vasospasm and death.   Discussed potential need for repeat ablation procedures and antiarrhythmic drugs after an initial ablation. The patient understands these risk and wishes to proceed.  We will therefore proceed with catheter ablation at the next available time.  Carto, ICE, anesthesia are requested for the procedure.   We will not obtain CT PV protocol prior to the procedure. -Continue metoprolol  XL 12.5mg  daily. -Continue warfarin.   #PVCs and NSVT -Given h/o reduced LVEF, 35-40%, recommend loop recorder for monitoring for sustained VT. If sustained VT is observed, then patient will need ICD. Plan to implant loop recorder at time of ablation.   #Chronic systolic heart failure: Appears well compensated. #NICM - Rhythm control strategy as above.  - ILR to monitor for sustained VT. If present, patient needs ICD. - Continue GDMT regimen of lisinopril , metoprolol  and follow up with general cardiology.   Follow up with Dr. Kennyth 3 months after ablation.   Signed, Ryan Kennyth, MD

## 2024-04-25 ENCOUNTER — Other Ambulatory Visit: Payer: Self-pay

## 2024-04-25 ENCOUNTER — Encounter: Payer: Self-pay | Admitting: Cardiology

## 2024-04-25 ENCOUNTER — Ambulatory Visit: Attending: Cardiology | Admitting: Cardiology

## 2024-04-25 VITALS — BP 112/58 | HR 76 | Ht 67.0 in | Wt 178.2 lb

## 2024-04-25 DIAGNOSIS — I480 Paroxysmal atrial fibrillation: Secondary | ICD-10-CM | POA: Diagnosis not present

## 2024-04-25 DIAGNOSIS — I4892 Unspecified atrial flutter: Secondary | ICD-10-CM | POA: Diagnosis not present

## 2024-04-25 DIAGNOSIS — I493 Ventricular premature depolarization: Secondary | ICD-10-CM

## 2024-04-25 DIAGNOSIS — D6869 Other thrombophilia: Secondary | ICD-10-CM

## 2024-04-25 DIAGNOSIS — I4729 Other ventricular tachycardia: Secondary | ICD-10-CM

## 2024-04-25 DIAGNOSIS — I428 Other cardiomyopathies: Secondary | ICD-10-CM

## 2024-04-25 DIAGNOSIS — I5022 Chronic systolic (congestive) heart failure: Secondary | ICD-10-CM

## 2024-04-25 MED ORDER — METOPROLOL SUCCINATE ER 25 MG PO TB24: 25.0000 mg | ORAL_TABLET | Freq: Every day | ORAL | 3 refills | Status: DC

## 2024-04-25 NOTE — Patient Instructions (Addendum)
 Medication Instructions:  Your physician has recommended you make the following change in your medication:  1) STOP taking Lopressor  (metoprolol  tartrate) 2) START taking Toprol  XL (metoprolol  succinate) 25 mg once daily  *If you need a refill on your cardiac medications before your next appointment, please call your pharmacy*  Testing/Procedures: Ablation Your physician has recommended that you have an ablation. Catheter ablation is a medical procedure used to treat some cardiac arrhythmias (irregular heartbeats). During catheter ablation, a long, thin, flexible tube is put into a blood vessel in your groin (upper thigh), or neck. This tube is called an ablation catheter. It is then guided to your heart through the blood vessel. Radio frequency waves destroy small areas of heart tissue where abnormal heartbeats may cause an arrhythmia to start.   You are scheduled for Atrial Flutter Ablation on Friday, January 2nd with Dr. Dr. Kennyth. Please arrive at the Main Entrance A at [facility]: [address] [place], [place] [address] at 9:30am  What To Expect:  Labs: you will need to have lab work drawn within 30 days of your procedure. Please go to any [facility] location to have these drawn - no appointment is needed. You will receive procedure instructions either through MyChart or in the mail 4-6 week prior to your procedure.  After your procedure we recommend no driving for 4 days, no lifting over 5 lbs for 7 days, and no work or strenuous activity for 7 days.  Please contact our office at [phone] if you have any questions.   Follow-Up: We will contact you to schedule your post-procedure appointments.

## 2024-04-28 ENCOUNTER — Telehealth: Payer: Self-pay | Admitting: Cardiology

## 2024-04-28 ENCOUNTER — Other Ambulatory Visit (HOSPITAL_COMMUNITY): Payer: Self-pay

## 2024-04-28 MED ORDER — METOPROLOL SUCCINATE ER 25 MG PO TB24
12.5000 mg | ORAL_TABLET | Freq: Every day | ORAL | 3 refills | Status: AC
  Filled 2024-04-28: qty 45, 90d supply, fill #0

## 2024-04-28 NOTE — Telephone Encounter (Signed)
 Pt c/o medication issue:  1. Name of Medication: metoprolol  succinate (TOPROL  XL) 25 MG 24 hr tablet   2. How are you currently taking this medication (dosage and times per day)? As written  3. Are you having a reaction (difficulty breathing--STAT)? No  4. What is your medication issue? Pt states that since starting medication he has experienced dizziness and headaches. Pt requesting c/b regarding this matter

## 2024-04-28 NOTE — Telephone Encounter (Signed)
 Patient called to report that since starting metoprolol  succinate 25 mg daily, he has been experiencing dizziness and headaches. He noted that yesterday, while watching TV, he felt as though the room was spinning. The patient reported that he has not been routinely checking his blood pressure or heart rate but obtained readings while on the phone. His current BP is 134/67 with a heart rate of 51.  Tylene Lunch, NP, was notified and recommended that the patient decrease metoprolol  to 12.5 mg daily and maintain a log of his vital signs. The patient was also advised to report to the ER if he develops symptoms such as feeling like he is going to pass out or black out. Patient verbalized understanding.

## 2024-05-03 ENCOUNTER — Ambulatory Visit: Attending: Internal Medicine

## 2024-05-03 DIAGNOSIS — Z5181 Encounter for therapeutic drug level monitoring: Secondary | ICD-10-CM | POA: Diagnosis not present

## 2024-05-03 DIAGNOSIS — Z7901 Long term (current) use of anticoagulants: Secondary | ICD-10-CM | POA: Diagnosis not present

## 2024-05-03 DIAGNOSIS — Z952 Presence of prosthetic heart valve: Secondary | ICD-10-CM | POA: Diagnosis not present

## 2024-05-03 LAB — POCT INR: INR: 2.2 (ref 2.0–3.0)

## 2024-05-03 NOTE — Patient Instructions (Signed)
 Take 1 tablet tonight only then Continue 1/2 tablet every day EXCEPT 1 tablet on Mondays and Fridays.   EAT GREENS 1 DAY PER WEEK;. [phone];  Ablation 1/2 weekly INRs Recheck INR in 2 weeks

## 2024-05-06 ENCOUNTER — Other Ambulatory Visit: Payer: Self-pay | Admitting: Internal Medicine

## 2024-05-06 DIAGNOSIS — I480 Paroxysmal atrial fibrillation: Secondary | ICD-10-CM

## 2024-05-08 NOTE — Telephone Encounter (Signed)
 Refill request for warfarin:  Last INR was 2.2 on 05/03/24 Next INR due 05/17/24 LOV was 04/25/24  Refill approved.

## 2024-05-16 ENCOUNTER — Telehealth: Payer: Self-pay

## 2024-05-16 NOTE — Telephone Encounter (Signed)
-----   Message from Nurse Carlyle C sent at 04/25/2024  3:54 PM EST ----- Regarding: 06/16/24 aflutter ablation and loop implant  Precert:  MD: Kennyth Type of ablation: A-flutter Diagnosis: Aflutter CPT code: A-flutter (06346) Ablation scheduled (date/time): 06/16/24 at 11:30am  Loop Implant for NSVT  Procedure:  Added to calendar? Yes Orders entered? Yes Letter complete? No, >30 days before procedure Scheduled with cath lab? Yes Any medications to hold? No, but patient is on coumadin  - message has been sent to anticoag pool Labs ordered (CBC, BMET, PT/INR if on warfarin): Yes Mapping system: Doesn't matter CARTO/OPAL rep notified? No Cardiac CT needed? No Dye allergy? No Pre-meds ordered and instructions given? No, not needed Letter method: MyChart H&P: 11/11 Device: No  Follow-up:  Cassie/Angel, please schedule Routine.  Covering RN - please send this message to [employer], EP scheduler, EP Scheduling pool, EP [employer], and CT scheduler (Brittany Lynch/Stephanie Mogg), if indicated.

## 2024-05-17 ENCOUNTER — Other Ambulatory Visit: Payer: Self-pay | Admitting: Nurse Practitioner

## 2024-05-17 ENCOUNTER — Ambulatory Visit: Attending: Internal Medicine

## 2024-05-17 DIAGNOSIS — Z952 Presence of prosthetic heart valve: Secondary | ICD-10-CM | POA: Diagnosis not present

## 2024-05-17 DIAGNOSIS — Z5181 Encounter for therapeutic drug level monitoring: Secondary | ICD-10-CM

## 2024-05-17 DIAGNOSIS — Z7901 Long term (current) use of anticoagulants: Secondary | ICD-10-CM | POA: Diagnosis not present

## 2024-05-17 LAB — POCT INR: INR: 1.8 — AB (ref 2.0–3.0)

## 2024-05-17 NOTE — Patient Instructions (Signed)
 Take 2 tablets tonight only then Increase to 1/2 tablet every day EXCEPT 1 tablet on Monday, Wednesdays and Fridays.   EAT GREENS 1 DAY PER WEEK;. [phone];  Ablation 1/2 weekly INRs Recheck INR in 1 week

## 2024-05-18 LAB — CBC
Hematocrit: 39.2 % (ref 37.5–51.0)
Hemoglobin: 13.3 g/dL (ref 13.0–17.7)
MCH: 32.8 pg (ref 26.6–33.0)
MCHC: 33.9 g/dL (ref 31.5–35.7)
MCV: 97 fL (ref 79–97)
Platelets: 140 x10E3/uL — ABNORMAL LOW (ref 150–450)
RBC: 4.06 x10E6/uL — ABNORMAL LOW (ref 4.14–5.80)
RDW: 13.2 % (ref 11.6–15.4)
WBC: 4.5 x10E3/uL (ref 3.4–10.8)

## 2024-05-18 LAB — BASIC METABOLIC PANEL WITH GFR
BUN/Creatinine Ratio: 12 (ref 9–20)
BUN: 13 mg/dL (ref 6–24)
CO2: 25 mmol/L (ref 20–29)
Calcium: 8.6 mg/dL — ABNORMAL LOW (ref 8.7–10.2)
Chloride: 100 mmol/L (ref 96–106)
Creatinine, Ser: 1.07 mg/dL (ref 0.76–1.27)
Glucose: 96 mg/dL (ref 70–99)
Potassium: 3.9 mmol/L (ref 3.5–5.2)
Sodium: 136 mmol/L (ref 134–144)
eGFR: 81 mL/min/1.73 (ref >59)

## 2024-05-19 NOTE — Telephone Encounter (Signed)
 Requested Prescriptions  Pending Prescriptions Disp Refills   lovastatin  (MEVACOR ) 20 MG tablet [Pharmacy Med Name: Lovastatin  20 MG Oral Tablet] 90 tablet 0    Sig: TAKE 1 TABLET BY MOUTH AT BEDTIME     Cardiovascular:  Antilipid - Statins 2 Failed - 05/19/2024  4:37 PM      Failed - Lipid Panel in normal range within the last 12 months    Cholesterol, Total  Date Value Ref Range Status  08/11/2023 87 (L) 100 - 199 mg/dL Final   LDL Chol Calc (NIH)  Date Value Ref Range Status  08/11/2023 37 0 - 99 mg/dL Final   HDL  Date Value Ref Range Status  08/11/2023 36 (L) >39 mg/dL Final   Triglycerides  Date Value Ref Range Status  08/11/2023 62 0 - 149 mg/dL Final         Passed - Cr in normal range and within 360 days    Creatinine, Ser  Date Value Ref Range Status  05/17/2024 1.07 0.76 - 1.27 mg/dL Final         Passed - Patient is not pregnant      Passed - Valid encounter within last 12 months    Recent Outpatient Visits           9 months ago Enlarged coronary artery    [facility] Melvin Pao, NP       Future Appointments             In 2 weeks Gerard Frederick, NP  HeartCare at [facility]

## 2024-05-24 ENCOUNTER — Ambulatory Visit: Attending: Internal Medicine

## 2024-05-24 DIAGNOSIS — Z5181 Encounter for therapeutic drug level monitoring: Secondary | ICD-10-CM

## 2024-05-24 DIAGNOSIS — Z7901 Long term (current) use of anticoagulants: Secondary | ICD-10-CM

## 2024-05-24 DIAGNOSIS — Z952 Presence of prosthetic heart valve: Secondary | ICD-10-CM | POA: Diagnosis not present

## 2024-05-24 LAB — POCT INR: INR: 3.4 — AB (ref 2.0–3.0)

## 2024-05-24 NOTE — Patient Instructions (Signed)
 Continue 1/2 tablet every day EXCEPT 1 tablet on Monday, Wednesdays and Fridays. Eat greens tonight.   EAT GREENS 1 DAY PER WEEK;. [phone];  Ablation 1/2 weekly INRs Recheck INR in 1 week

## 2024-05-25 ENCOUNTER — Encounter (HOSPITAL_COMMUNITY): Payer: Self-pay

## 2024-05-25 ENCOUNTER — Telehealth (HOSPITAL_COMMUNITY): Payer: Self-pay

## 2024-05-25 NOTE — Telephone Encounter (Signed)
 Attempted to reach patient to discuss upcoming procedure, no answer. Left VM for patient to return call.

## 2024-05-26 NOTE — Telephone Encounter (Signed)
 Spoke with patient to complete pre-procedure call.     Health status review:  Any new medical conditions, recent signs of acute illness or been started on antibiotics? No Any recent hospitalizations or surgeries? No Any new medications started since pre-op visit? No  Follow all medication instructions prior to procedure or the procedure may be rescheduled:    Continue taking Coumadin  (Warfarin) once daily without missing any doses before procedure. Essential chronic medications:  No medication should be continued, unless told otherwise. On the morning of your procedure DO NOT take any medication., including Coumadin  (Warfarin).  Nothing to eat or drink after midnight prior to your procedure.  Pre-procedure testing scheduled: CT not required and lab work completed.  Confirmed patient is scheduled for Atrial Flutter Ablation, Loop Recorder Insertion on Friday, January 2 with Dr. Kennyth. Instructed patient to arrive at the Main Entrance A at [facility]: [address] [place], [place] [address] and check in at Admitting at 9:30 AM.  Plan to go home the same day, you will only stay overnight if medically necessary. You MUST have a responsible adult to drive you home and MUST be with you the first 24 hours after you arrive home or your procedure could be cancelled.  Informed a nurse may call a day before the procedure to confirm arrival time and ensure instructions are followed.  Patient verbalized understanding to information provided and is agreeable to proceed with procedure.   Advised to contact RN Navigator at [phone], to inform of any new medications started after call or concerns prior to procedure.

## 2024-05-31 ENCOUNTER — Ambulatory Visit: Attending: Internal Medicine

## 2024-05-31 DIAGNOSIS — Z7901 Long term (current) use of anticoagulants: Secondary | ICD-10-CM

## 2024-05-31 DIAGNOSIS — Z5181 Encounter for therapeutic drug level monitoring: Secondary | ICD-10-CM | POA: Diagnosis not present

## 2024-05-31 DIAGNOSIS — Z952 Presence of prosthetic heart valve: Secondary | ICD-10-CM

## 2024-05-31 LAB — POCT INR: INR: 3.7 — AB (ref 2.0–3.0)

## 2024-05-31 NOTE — Patient Instructions (Signed)
 Continue 1/2 tablet every day EXCEPT 1 tablet on Monday, Wednesdays and Fridays. Eat greens tonight.   EAT GREENS 1 DAY PER WEEK;. [phone];  Ablation 1/2 weekly INRs Recheck INR in 1 week

## 2024-06-03 ENCOUNTER — Ambulatory Visit: Payer: Self-pay | Admitting: Cardiology

## 2024-06-06 NOTE — Progress Notes (Unsigned)
 " Cardiology Office Note:    Date:  06/07/2024   ID:  Ryan [name], DOB Sep 13, 1965, MRN [id number]  PCP:  Melvin Pao, NP   [facility] Providers Cardiologist:  Lonni Hanson, MD Cardiology APP:  Gerard Frederick, NP  Electrophysiologist:  Fonda Kitty, MD { Click to update primary MD,subspecialty MD or APP then REFRESH:1}    Referring MD: Melvin Pao, NP   Chief complaint: 50-month follow-up      History of Present Illness:   Ryan [name] is a 58 y.o. 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  LHC 07/2020 performed at [facility] in the setting of an abnormal stress test showed normal coronary arteries.  Patient continued to have atypical chest pain, mildly relieved with cholecystectomy.  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  09/02/2023 admitted to [facility] following tachypalpitations with home monitor showing HR 160s.  Observed to be in A-fib/a flutter with RVR, hypotensive.  Underwent successful TEE/DCCV, bradycardic at follow-up, metoprolol  decreased, updated echo showing LVEF 35-40%, moderate LVH, mild MR, severely dilated LA.   Cardiac PET stress 12/02/2023: No evidence of ischemia, with evidence of infarction.  Rest EF 34%, stress EF 37%, end-diastolic cavity size severely enlarged, mild coronary calcifications were present in the LAD and RCA, intermediate risk study.   Presented to ED in August 2025  following chest pain, no significant findings on labs or imaging.  Treated with Solu-Medrol  and Benadryl , discharged following symptom resolution.  Cardiac event monitoring performed outpatient revealed average heart rate 66 bpm, episodes of NSVT lasting up to 9.4 seconds, 165 SVT runs lasting up to 14.9 seconds, NSR predominantly.  Referred to EP team, who evaluated patient on 04/25/2024, opted for rhythm control strategy.  Plan for ablation in January 2026 with ILR insertion, if sustained VT is observed the patient will require an ICD.  Presents independently today, doing well from a cardiac standpoint. He denies chest pain, dyspnea, orthopnea, n, v,  dark/tarry/bloody stools, hematuria, dizziness, syncope, edema, weight gain. Reports occasional tachy-palpitations unchanged from prior, denies associated SOB/lightheadedness. Works at [employer] from 2p-1a, reports BPs are typically lower on waking around 10-11am, rise throughout the day.   ROS:   Please see the history of present illness.    All other systems reviewed and are negative.     Past Medical History:  Diagnosis Date   AAA (abdominal aortic aneurysm)    Acute cholecystitis 12/25/2021   Acute cystitis with hematuria 11/20/2020   Last Assessment & Plan:   Formatting of this note might be different from the original.  Patient completed a course of Keflex. Symptoms resolved. Denies dysuria, frequency, urgency, fevers, chills, abdominal pain or cramps.   Anemia    Anxiety    Aortic aneurysm and dissection (HCC)    a. 2001 s/p grafting and AVR; b. 09/2021 CTA Chest: Dil Ao root up to 51mm. Ao arch 46mm. Desc Ao 54mm. Complicated arch dissection w/ mult false and/or partially thrombosed lumens. Dissection flaps extend into all great vessels except  L carotid. Dissection cont into R CCA and into abd->L RA.   Blood transfusion without reported diagnosis    Carotid artery occlusion    05/16/2000   Cataract    Chronic anticoagulation    Chronic  cholecystitis 01/31/2022   Chronic kidney disease    Not sure   Clotting disorder May 17 2000   Depression    Dissecting aortic aneurysm, thoracoabdominal (HCC)    Fatty liver    GERD (gastroesophageal reflux disease)    H/O mechanical aortic valve replacement    a. 2001 s/p AVR in setting of Asc Ao aneurysm repair; b. 08/2021 Echo: EF 50-55%, no rwma, mild LVH, GrII DD, nl RV fxn, mild MR, triv AI w/ mean AoV grad .   Heart murmur    History of cardiac catheterization    a. 08/2019 MV: basal-dist lateral and inflat defect; b. 07/2020 Cath: nl cors.   Hyperlipidemia    Hypertension    Hypolipidemia    Iron deficiency    PAF (paroxysmal atrial fibrillation) (HCC)    a. CHA2DS2VASc = 3.  Chronic warfarin (also mech AVR).   Pulmonary nodule    Sleep apnea    Stroke [facility])     Past Surgical History:  Procedure Laterality Date   ABDOMINAL AORTIC ANEURYSM REPAIR  05/17/00   ABDOMINAL SURGERY     AORTIC VALVE REPLACEMENT  2001   St Jude Mechanical   ARTHROSCOPIC REPAIR ACL     CARDIAC VALVE REPLACEMENT  05/17/00   CARDIOVERSION N/A 08/27/2023   Procedure: CARDIOVERSION;  Surgeon: Perla Evalene PARAS, MD;  Location: [facility] ORS;  Service: Cardiovascular;  Laterality: N/A;   CHOLECYSTECTOMY  02/01/2022   COLONOSCOPY  2019   CORONARY ANGIOPLASTY     Noy sure   CORONARY ARTERY BYPASS GRAFT  05/17/00   EYE SURGERY     IR PERC CHOLECYSTOSTOMY  12/26/2021   MENISCUS REPAIR      Current Medications: Active Medications[1]   Allergies:   Alitraq, Iodinated contrast media, and Iodine   Social History   Socioeconomic History   Marital status: Single    Spouse name: Not on file   Number of children: Not on file   Years of education: Not on file   Highest education level: 12th grade  Occupational History   Not on file  Tobacco Use   Smoking status: Never    Passive exposure: Past   Smokeless tobacco: Never  Vaping Use   Vaping status: Never Used  Substance and Sexual  Activity   Alcohol use: Yes    Comment: 2 mixed drinks per year   Drug use: Never   Sexual activity: Not Currently  Other Topics Concern   Not on file  Social History Narrative   Not on file   Social Drivers of Health   Tobacco Use: Low Risk (06/07/2024)   Patient History    Smoking Tobacco Use: Never    Smokeless Tobacco Use: Never    Passive Exposure: Past  Financial Resource Strain: High Risk (08/07/2023)   Overall Financial Resource Strain (CARDIA)    Difficulty of Paying Living Expenses: Hard  Food Insecurity: Food Insecurity Present (08/26/2023)   Hunger Vital Sign    Worried About Running Out of Food in the Last Year: Sometimes true    Ran Out of Food in the Last Year: Sometimes true  Transportation Needs: No Transportation Needs (08/26/2023)   PRAPARE - Transportation    Lack of Transportation (Medical): No    Lack  of Transportation (Non-Medical): No  Physical Activity: Unknown (04/05/2023)   Exercise Vital Sign    Days of Exercise per Week: 0 days    Minutes of Exercise per Session: Not on file  Stress: Stress Concern Present (04/05/2023)   [employer] of Occupational Health - Occupational Stress Questionnaire    Feeling of Stress : Rather much  Social Connections: Unknown (08/26/2023)   Social Connection and Isolation Panel    Frequency of Communication with Friends and Family: More than three times a week    Frequency of Social Gatherings with Friends and Family: Once a week    Attends Religious Services: More than 4 times per year    Active Member of Clubs or Organizations: No    Attends Banker Meetings: Not on file    Marital Status: Patient declined  Depression (PHQ2-9): Low Risk (08/11/2023)   Depression (PHQ2-9)    PHQ-2 Score: 0  Alcohol Screen: Low Risk (08/07/2023)   Alcohol Screen    Last Alcohol Screening Score (AUDIT): 1  Housing: High Risk (08/26/2023)   Housing Stability Vital Sign    Unable to Pay for Housing in the Last Year:  Yes    Number of Times Moved in the Last Year: 0    Homeless in the Last Year: No  Utilities: Not At Risk (08/26/2023)   AHC Utilities    Threatened with loss of utilities: No  Health Literacy: Not on file     Family History: The patient's family history includes Diabetes in his mother; Heart attack in his maternal grandfather and mother; Heart disease in his father and mother; Hyperlipidemia in his mother; Hypertension in his mother.  EKGs/Labs/Other Studies Reviewed:    The following studies were reviewed today:  EKG Interpretation Date/Time:  Wednesday June 07 2024 11:10:42 EST Ventricular Rate:  47 PR Interval:  158 QRS Duration:  158 QT Interval:  476 QTC Calculation: 421 R Axis:   19  Text Interpretation: Sinus bradycardia Left bundle branch block When compared with ECG of 03-Apr-2024 10:53, No significant change was found Confirmed by Allante Beane [phone]) on 06/07/2024 11:16:09 AM    Recent Labs: 08/25/2023: ALT 14 08/28/2023: B Natriuretic Peptide 211.1; Magnesium  1.7 09/01/2023: TSH 4.190 05/17/2024: BUN 13; Creatinine, Ser 1.07; Hemoglobin 13.3; Platelets 140; Potassium 3.9; Sodium 136  Recent Lipid Panel    Component Value Date/Time   CHOL 87 (L) 08/11/2023 1116   TRIG 62 08/11/2023 1116   HDL 36 (L) 08/11/2023 1116   CHOLHDL 2.4 08/11/2023 1116   CHOLHDL 2.6 10/12/2022 0209   VLDL 8 10/12/2022 0209   LDLCALC 37 08/11/2023 1116     Risk Assessment/Calculations:    CHA2DS2-VASc Score = 4  {Confirm score is correct.  If not, click here to update score.  REFRESH note.  :1} This indicates a 4.8% annual risk of stroke. The patient's score is based upon: CHF History: 0 HTN History: 1 Diabetes History: 0 Stroke History: 2 Vascular Disease History: 1 Age Score: 0 Gender Score: 0   {This patient has a significant risk of stroke if diagnosed with atrial fibrillation.  Please consider VKA or DOAC agent for anticoagulation if the bleeding risk is  acceptable.   You can also use the SmartPhrase .[email or web address] for documentation.   :[id number]}            Physical Exam:    VS:  BP (!) 100/58 (BP Location: Left Arm, Patient Position: Sitting, Cuff Size: Normal)   Pulse (!) 47 Comment:  55 oximeter  Ht 5' 7 (1.702 m)   Wt 173 lb (78.5 kg)   SpO2 96%   BMI 27.10 kg/m        Wt Readings from Last 3 Encounters:  06/07/24 173 lb (78.5 kg)  04/25/24 178 lb 3.2 oz (80.8 kg)  04/03/24 172 lb 6.4 oz (78.2 kg)     GEN: Well nourished, well developed in no acute distress HEENT: Normal NECK: No carotid bruits CARDIAC:  S1-S2 normal, RRR, systolic murmur w/ mechanical click, rubs, gallops RESPIRATORY:  Clear to auscultation without rales, wheezing or rhonchi  MUSCULOSKELETAL:  No edema; No deformity  SKIN: Warm and dry NEUROLOGIC:  Alert and oriented x 3 PSYCHIATRIC:  Normal affect       Assessment & Plan Coronary artery disease involving native coronary artery of native heart without angina pectoris cPET stress 12/02/2023: No ischemia, positive for infarction, rest EF: 34%, mild coronary calcium, intermediate risk study. LHC 2022: Normal coronarys, no CAD Aortic root repair and mechanical AVR 2001, visited coumadin  clinic prior to arrival to visit EKG: 47 bpm, Sinus brady, LBBB, no significant change from prior studies Denies CP, SOB, change in chronic palpitations, near syncope Continue aspirin  81 mg daily Continue toprol -xl 12.5 mg daily Continue Lovastatin  20 mg daily Continue to monitor for new symptoms PAF (paroxysmal atrial fibrillation) (HCC) Current use of long term anticoagulation Atrial flutter, unspecified type (HCC) NSVT (nonsustained ventricular tachycardia) (HCC) Sinus brady today, no significant change reported in palpitation nature or quantity, denies associated lightheadedness/near syncope, chest pain Continue work up for ablation scheduled in January with our EP team Labs updated on 05/17/2024, all similar to  prior labs HFrEF (heart failure with reduced ejection fraction) (HCC) Echo 09/08/2023: LVEF 35-40%, moderately decreased LV function, internal cavity moderately dilated, moderate LVH, indeterminate diastolic parameters. Mechanical aortic valve present.  Decreased EF believed to be tachy-mediated, proceed with further EP workup/treatment as above Denies SOB, near syncope, edema, DOE, orthopnea GDMT limited 2/2 low normal BP (100/58 today) Continue lisinopril  2.5 mg daily Continue Toprol -XL 12.5 mg daily Essential hypertension BP well controlled on current regimen, typically on the lower side of normal with occasional spikes in to 140s-150s while working/afternoon prior to taking his dose of BP meds (takes all at night/afternoon, none in am).  Asymptomatic w/ low normal BP Continue lisinopril  and toprol -xl as above Hyperlipidemia, mixed Lipid panel 07/2023: Chol 87, HDL 36, LDL 37, Trig 62 Continue lovastatin  20 mg daily Status post mechanical aortic valve replacement On warfarin, reports compliance  Continue follow up with coumadin  clinic  Disposition: Follow up with EP after ablation, follow up with gen cards in 3 months or sooner if needed *** Route to primary cardiologist           Medication Adjustments/Labs and Tests Ordered: Current medicines are reviewed at length with the patient today.  Concerns regarding medicines are outlined above.  Orders Placed This Encounter  Procedures   EKG 12-Lead   No orders of the defined types were placed in this encounter.   Patient Instructions  Medication Instructions:  Your physician recommends that you continue on your current medications as directed. Please refer to the Current Medication list given to you today.   *If you need a refill on your cardiac medications before your next appointment, please call your pharmacy*  Lab Work: No labs ordered today  If you have labs (blood work) drawn today and your tests are completely normal,  you will receive your results only by:  MyChart Message (if you have MyChart) OR A paper copy in the mail If you have any lab test that is abnormal or we need to change your treatment, we will call you to review the results.  Testing/Procedures: No test ordered today   Follow-Up: At [facility], you and your health needs are our priority.  As part of our continuing mission to provide you with exceptional heart care, our providers are all part of one team.  This team includes your primary Cardiologist (physician) and Advanced Practice Providers or APPs (Physician Assistants and Nurse Practitioners) who all work together to provide you with the care you need, when you need it.  Your next appointment:   3 month(s)  Provider:   You may see Lonni Hanson, MD or one of the following Advanced Practice Providers on your designated Care Team:   Tylene Lunch, NP           Signed, Janavia Rottman E Myrtle Barnhard, NP  06/07/2024 11:47 AM    [facility]     [1]  Current Meds  Medication Sig   acetaminophen  (TYLENOL ) 500 MG tablet Take 500 mg by mouth every 6 (six) hours as needed for headache, fever, moderate pain or mild pain.   aspirin  81 MG EC tablet Take 81 mg by mouth at bedtime.   Ferrous Sulfate  Dried (HIGH POTENCY IRON) 65 MG TABS Take 2 tablets by mouth at bedtime.   fluticasone  (FLONASE ) 50 MCG/ACT nasal spray Place 1 spray into both nostrils 2 (two) times daily. (Patient taking differently: Place 1 spray into both nostrils 2 (two) times daily as needed for allergies or rhinitis.)   lisinopril  (ZESTRIL ) 2.5 MG tablet Take 1 tablet (2.5 mg total) by mouth daily.   lovastatin  (MEVACOR ) 20 MG tablet TAKE 1 TABLET BY MOUTH AT BEDTIME   metoprolol  succinate (TOPROL  XL) 25 MG 24 hr tablet Take 1/2 tablet (12.5 mg total) by mouth daily.   Potassium 99 MG TABS Take 2 tablets by mouth at bedtime.   warfarin (COUMADIN ) 5 MG tablet TAKE 1/2 TO 1 (ONE-HALF TO ONE) TABLET BY MOUTH  ONCE DAILY AS DIRECTED BY  ANTICOAGULATION  CLINIC (Patient taking differently: Take 5 mg by mouth See admin instructions. Take 5 mg on Mon., Wed., and Fri., all the other day take 2.5 mg at bedtime AS DIRECTED BY  ANTICOAGULATION  CLINIC)   "

## 2024-06-06 NOTE — Progress Notes (Signed)
"   INR 3.7 Please see anticoagulation encounter Continue 1/2 tablet every day EXCEPT 1 tablet on Monday, Wednesdays and Fridays. Eat greens tonight.   EAT GREENS 1 DAY PER WEEK;. [phone];  Ablation 1/2 weekly INRs Recheck INR in 1 week "

## 2024-06-07 ENCOUNTER — Encounter: Payer: Self-pay | Admitting: Cardiology

## 2024-06-07 ENCOUNTER — Ambulatory Visit

## 2024-06-07 ENCOUNTER — Ambulatory Visit: Attending: Cardiology | Admitting: Emergency Medicine

## 2024-06-07 VITALS — BP 100/58 | HR 47 | Ht 67.0 in | Wt 173.0 lb

## 2024-06-07 DIAGNOSIS — I2510 Atherosclerotic heart disease of native coronary artery without angina pectoris: Secondary | ICD-10-CM | POA: Diagnosis not present

## 2024-06-07 DIAGNOSIS — I10 Essential (primary) hypertension: Secondary | ICD-10-CM

## 2024-06-07 DIAGNOSIS — I4892 Unspecified atrial flutter: Secondary | ICD-10-CM | POA: Diagnosis not present

## 2024-06-07 DIAGNOSIS — E782 Mixed hyperlipidemia: Secondary | ICD-10-CM

## 2024-06-07 DIAGNOSIS — I5020 Unspecified systolic (congestive) heart failure: Secondary | ICD-10-CM

## 2024-06-07 DIAGNOSIS — Z5181 Encounter for therapeutic drug level monitoring: Secondary | ICD-10-CM

## 2024-06-07 DIAGNOSIS — Z952 Presence of prosthetic heart valve: Secondary | ICD-10-CM

## 2024-06-07 DIAGNOSIS — Z7901 Long term (current) use of anticoagulants: Secondary | ICD-10-CM | POA: Diagnosis not present

## 2024-06-07 DIAGNOSIS — I4729 Other ventricular tachycardia: Secondary | ICD-10-CM | POA: Diagnosis not present

## 2024-06-07 DIAGNOSIS — I480 Paroxysmal atrial fibrillation: Secondary | ICD-10-CM

## 2024-06-07 LAB — POCT INR: INR: 2 (ref 2.0–3.0)

## 2024-06-07 NOTE — Patient Instructions (Signed)
 Medication Instructions:  Your physician recommends that you continue on your current medications as directed. Please refer to the Current Medication list given to you today.   *If you need a refill on your cardiac medications before your next appointment, please call your pharmacy*  Lab Work: No labs ordered today  If you have labs (blood work) drawn today and your tests are completely normal, you will receive your results only by: MyChart Message (if you have MyChart) OR A paper copy in the mail If you have any lab test that is abnormal or we need to change your treatment, we will call you to review the results.  Testing/Procedures: No test ordered today   Follow-Up: At [facility], you and your health needs are our priority.  As part of our continuing mission to provide you with exceptional heart care, our providers are all part of one team.  This team includes your primary Cardiologist (physician) and Advanced Practice Providers or APPs (Physician Assistants and Nurse Practitioners) who all work together to provide you with the care you need, when you need it.  Your next appointment:   3 month(s)  Provider:   You may see Lonni Hanson, MD or one of the following Advanced Practice Providers on your designated Care Team:   Tylene Lunch, NP

## 2024-06-07 NOTE — Patient Instructions (Signed)
 Take 2 tablets today only then Continue 1/2 tablet every day EXCEPT 1 tablet on Monday, Wednesdays and Fridays.   EAT GREENS 1 DAY PER WEEK;. [phone];  Ablation 1/2 weekly INRs Recheck INR in 1 week

## 2024-06-07 NOTE — Assessment & Plan Note (Signed)
 BP well controlled on current regimen, typically on the lower side of normal with occasional spikes in to 140s-150s while working/afternoon prior to taking his dose of BP meds (takes all at night/afternoon, none in am).  Asymptomatic w/ low normal BP Continue lisinopril  and toprol -xl as above

## 2024-06-07 NOTE — Assessment & Plan Note (Signed)
 On warfarin, reports compliance  SBE prophylaxis indicated for all dental procedures. Continue follow up with coumadin  clinic

## 2024-06-13 NOTE — Telephone Encounter (Signed)
 FYI- patient's INRs were 3.4, 3.7, 2.0, and next reading on 12/31. Scheduled for AFL ablation and ILR on 1/2.

## 2024-06-14 ENCOUNTER — Ambulatory Visit

## 2024-06-14 ENCOUNTER — Encounter: Payer: Self-pay | Admitting: Internal Medicine

## 2024-06-14 DIAGNOSIS — Z7901 Long term (current) use of anticoagulants: Secondary | ICD-10-CM

## 2024-06-14 DIAGNOSIS — Z952 Presence of prosthetic heart valve: Secondary | ICD-10-CM

## 2024-06-14 DIAGNOSIS — Z5181 Encounter for therapeutic drug level monitoring: Secondary | ICD-10-CM

## 2024-06-14 LAB — POCT INR: INR: 2.3 (ref 2.0–3.0)

## 2024-06-14 NOTE — Patient Instructions (Signed)
 Take 1.5 tablets today only then Continue 1/2 tablet every day EXCEPT 1 tablet on Monday, Wednesdays and Fridays.   [phone];  Ablation 1/2 weekly INRs Recheck INR in 1 week

## 2024-06-14 NOTE — Pre-Procedure Instructions (Signed)
 Instructed patient on the following items: Arrival time 0730- new arrival time Nothing to eat or drink after midnight No meds AM of procedure Responsible person to drive you home and stay with you for 24 hrs  Have you missed any doses of anti-coagulant Coumadin - hasn't missed any doses.  Takes once a day.

## 2024-06-15 ENCOUNTER — Encounter: Payer: Self-pay | Admitting: Cardiology

## 2024-06-16 ENCOUNTER — Encounter (HOSPITAL_COMMUNITY): Payer: Self-pay | Admitting: Cardiology

## 2024-06-16 ENCOUNTER — Ambulatory Visit (HOSPITAL_COMMUNITY)
Admission: RE | Disposition: A | Discharge status: Home / Self Care | Payer: Self-pay | Source: Home / Self Care | Attending: Cardiology

## 2024-06-16 ENCOUNTER — Ambulatory Visit (HOSPITAL_COMMUNITY): Admitting: Certified Registered Nurse Anesthetist

## 2024-06-16 ENCOUNTER — Other Ambulatory Visit: Payer: Self-pay

## 2024-06-16 ENCOUNTER — Ambulatory Visit (HOSPITAL_COMMUNITY)
Admission: RE | Admit: 2024-06-16 | Discharge: 2024-06-16 | Disposition: A | Discharge status: Home / Self Care | Source: Home / Self Care | Attending: Cardiology | Admitting: Cardiology

## 2024-06-16 DIAGNOSIS — Z79899 Other long term (current) drug therapy: Secondary | ICD-10-CM | POA: Insufficient documentation

## 2024-06-16 DIAGNOSIS — Z7901 Long term (current) use of anticoagulants: Secondary | ICD-10-CM | POA: Diagnosis not present

## 2024-06-16 DIAGNOSIS — I130 Hypertensive heart and chronic kidney disease with heart failure and stage 1 through stage 4 chronic kidney disease, or unspecified chronic kidney disease: Secondary | ICD-10-CM | POA: Diagnosis not present

## 2024-06-16 DIAGNOSIS — I5022 Chronic systolic (congestive) heart failure: Secondary | ICD-10-CM

## 2024-06-16 DIAGNOSIS — I484 Atypical atrial flutter: Secondary | ICD-10-CM

## 2024-06-16 DIAGNOSIS — I493 Ventricular premature depolarization: Secondary | ICD-10-CM | POA: Insufficient documentation

## 2024-06-16 DIAGNOSIS — N182 Chronic kidney disease, stage 2 (mild): Secondary | ICD-10-CM | POA: Diagnosis not present

## 2024-06-16 DIAGNOSIS — I110 Hypertensive heart disease with heart failure: Secondary | ICD-10-CM | POA: Insufficient documentation

## 2024-06-16 DIAGNOSIS — I472 Ventricular tachycardia, unspecified: Secondary | ICD-10-CM | POA: Diagnosis not present

## 2024-06-16 DIAGNOSIS — E785 Hyperlipidemia, unspecified: Secondary | ICD-10-CM | POA: Diagnosis not present

## 2024-06-16 DIAGNOSIS — I4891 Unspecified atrial fibrillation: Secondary | ICD-10-CM

## 2024-06-16 DIAGNOSIS — I483 Typical atrial flutter: Secondary | ICD-10-CM

## 2024-06-16 DIAGNOSIS — G4733 Obstructive sleep apnea (adult) (pediatric): Secondary | ICD-10-CM | POA: Insufficient documentation

## 2024-06-16 DIAGNOSIS — I4892 Unspecified atrial flutter: Secondary | ICD-10-CM

## 2024-06-16 DIAGNOSIS — I447 Left bundle-branch block, unspecified: Secondary | ICD-10-CM | POA: Insufficient documentation

## 2024-06-16 DIAGNOSIS — I428 Other cardiomyopathies: Secondary | ICD-10-CM | POA: Insufficient documentation

## 2024-06-16 DIAGNOSIS — Z952 Presence of prosthetic heart valve: Secondary | ICD-10-CM | POA: Diagnosis not present

## 2024-06-16 DIAGNOSIS — I2510 Atherosclerotic heart disease of native coronary artery without angina pectoris: Secondary | ICD-10-CM | POA: Insufficient documentation

## 2024-06-16 DIAGNOSIS — I480 Paroxysmal atrial fibrillation: Secondary | ICD-10-CM | POA: Insufficient documentation

## 2024-06-16 HISTORY — PX: LOOP RECORDER INSERTION: EP1214

## 2024-06-16 HISTORY — PX: ATRIAL FIBRILLATION ABLATION: EP1191

## 2024-06-16 HISTORY — PX: A-FLUTTER ABLATION: EP1230

## 2024-06-16 LAB — PROTIME-INR
INR: 3.3 — ABNORMAL HIGH (ref 0.8–1.2)
Prothrombin Time: 34.9 s — ABNORMAL HIGH (ref 11.4–15.2)

## 2024-06-16 LAB — POCT ACTIVATED CLOTTING TIME
Activated Clotting Time: 281 s
Activated Clotting Time: >1000 s
Activated Clotting Time: 281 s

## 2024-06-16 MED ORDER — CEFAZOLIN SODIUM-DEXTROSE 2-4 GM/100ML-% IV SOLN: 2.0000 g | Freq: Once | INTRAVENOUS | Status: DC

## 2024-06-16 MED ORDER — ROCURONIUM BROMIDE 10 MG/ML (PF) SYRINGE
PREFILLED_SYRINGE | INTRAVENOUS | Status: DC | PRN
  Administered 2024-06-16: 50 mg via INTRAVENOUS
  Administered 2024-06-16 (×4): 10 mg via INTRAVENOUS

## 2024-06-16 MED ORDER — PHENYLEPHRINE HCL-NACL 20-0.9 MG/250ML-% IV SOLN
INTRAVENOUS | Status: DC | PRN
  Administered 2024-06-16: 25 ug/min via INTRAVENOUS

## 2024-06-16 MED ORDER — LIDOCAINE-EPINEPHRINE 1 %-1:100000 IJ SOLN
INTRAMUSCULAR | Status: AC
  Filled 2024-06-16: qty 1

## 2024-06-16 MED ORDER — SODIUM CHLORIDE 0.9 % IV SOLN: 250.0000 mL | INTRAVENOUS | Status: DC | PRN

## 2024-06-16 MED ORDER — ATROPINE SULFATE 1 MG/10ML IJ SOSY
PREFILLED_SYRINGE | INTRAMUSCULAR | Status: DC | PRN
  Administered 2024-06-16: 1 mg via INTRAVENOUS

## 2024-06-16 MED ORDER — ONDANSETRON HCL 4 MG/2ML IJ SOLN: 4.0000 mg | Freq: Four times a day (QID) | INTRAMUSCULAR | Status: DC | PRN

## 2024-06-16 MED ORDER — MIDAZOLAM HCL (PF) 2 MG/2ML IJ SOLN
INTRAMUSCULAR | Status: DC | PRN
  Administered 2024-06-16: 1 mg via INTRAVENOUS

## 2024-06-16 MED ORDER — HEPARIN SODIUM (PORCINE) 1000 UNIT/ML IJ SOLN
INTRAMUSCULAR | Status: DC | PRN
  Administered 2024-06-16: 1000 [IU] via INTRAVENOUS

## 2024-06-16 MED ORDER — SODIUM CHLORIDE 0.9% FLUSH: 3.0000 mL | INTRAVENOUS | Status: DC | PRN

## 2024-06-16 MED ORDER — PROTAMINE SULFATE 10 MG/ML IV SOLN
INTRAVENOUS | Status: DC | PRN
  Administered 2024-06-16: 40 mg via INTRAVENOUS

## 2024-06-16 MED ORDER — HEPARIN (PORCINE) IN NACL 1000-0.9 UT/500ML-% IV SOLN
INTRAVENOUS | Status: DC | PRN
  Administered 2024-06-16 (×3): 500 mL

## 2024-06-16 MED ORDER — PHENYLEPHRINE 80 MCG/ML (10ML) SYRINGE FOR IV PUSH (FOR BLOOD PRESSURE SUPPORT)
PREFILLED_SYRINGE | INTRAVENOUS | Status: DC | PRN
  Administered 2024-06-16 (×5): 80 ug via INTRAVENOUS

## 2024-06-16 MED ORDER — CEFAZOLIN SODIUM-DEXTROSE 2-3 GM-%(50ML) IV SOLR
INTRAVENOUS | Status: DC | PRN
  Administered 2024-06-16: 2 g via INTRAVENOUS

## 2024-06-16 MED ORDER — CEFAZOLIN SODIUM-DEXTROSE 2-4 GM/100ML-% IV SOLN
INTRAVENOUS | Status: AC
  Filled 2024-06-16: qty 100

## 2024-06-16 MED ORDER — EPHEDRINE SULFATE-NACL 50-0.9 MG/10ML-% IV SOSY
PREFILLED_SYRINGE | INTRAVENOUS | Status: DC | PRN
  Administered 2024-06-16 (×3): 5 mg via INTRAVENOUS

## 2024-06-16 MED ORDER — ACETAMINOPHEN 325 MG PO TABS: 650.0000 mg | ORAL_TABLET | ORAL | Status: DC | PRN

## 2024-06-16 MED ORDER — SODIUM CHLORIDE 0.9 % IV SOLN: INTRAVENOUS | Status: DC

## 2024-06-16 MED ORDER — MIDAZOLAM HCL 2 MG/2ML IJ SOLN
INTRAMUSCULAR | Status: AC
  Filled 2024-06-16: qty 2

## 2024-06-16 MED ORDER — HEPARIN SODIUM (PORCINE) 1000 UNIT/ML IJ SOLN
INTRAMUSCULAR | Status: DC | PRN
  Administered 2024-06-16 (×2): 2000 [IU] via INTRAVENOUS
  Administered 2024-06-16: 9000 [IU] via INTRAVENOUS
  Administered 2024-06-16: 5000 [IU] via INTRAVENOUS

## 2024-06-16 MED ORDER — SODIUM CHLORIDE 0.9% FLUSH: 3.0000 mL | Freq: Two times a day (BID) | INTRAVENOUS | Status: DC

## 2024-06-16 MED ORDER — PROPOFOL 10 MG/ML IV BOLUS
INTRAVENOUS | Status: DC | PRN
  Administered 2024-06-16: 100 mg via INTRAVENOUS

## 2024-06-16 MED ORDER — DEXAMETHASONE SOD PHOSPHATE PF 10 MG/ML IJ SOLN
INTRAMUSCULAR | Status: DC | PRN
  Administered 2024-06-16: 5 mg via INTRAVENOUS

## 2024-06-16 MED ORDER — SUGAMMADEX SODIUM 200 MG/2ML IV SOLN
INTRAVENOUS | Status: DC | PRN
  Administered 2024-06-16: 200 mg via INTRAVENOUS

## 2024-06-16 MED ORDER — ONDANSETRON HCL 4 MG/2ML IJ SOLN
INTRAMUSCULAR | Status: DC | PRN
  Administered 2024-06-16: 4 mg via INTRAVENOUS

## 2024-06-16 MED ORDER — LIDOCAINE HCL (CARDIAC) PF 100 MG/5ML IV SOSY
PREFILLED_SYRINGE | INTRAVENOUS | Status: DC | PRN
  Administered 2024-06-16: 40 mg via INTRAVENOUS

## 2024-06-16 MED ORDER — ATROPINE SULFATE 1 MG/10ML IJ SOSY
PREFILLED_SYRINGE | INTRAMUSCULAR | Status: AC
  Filled 2024-06-16: qty 10

## 2024-06-16 MED ORDER — FENTANYL CITRATE (PF) 100 MCG/2ML IJ SOLN
INTRAMUSCULAR | Status: AC
  Filled 2024-06-16: qty 2

## 2024-06-16 MED ORDER — FENTANYL CITRATE (PF) 100 MCG/2ML IJ SOLN
INTRAMUSCULAR | Status: DC | PRN
  Administered 2024-06-16 (×2): 50 ug via INTRAVENOUS

## 2024-06-16 NOTE — Discharge Instructions (Signed)

## 2024-06-16 NOTE — Progress Notes (Signed)
 Pt and 2hr person received discharge instructions, teachback performed. Iv removed, no complications. Bilat groin sites are clean dry intact, sites are soft, no signs of bleeding. Pt escorted out via wheelchair to 24hr person's vehicle.

## 2024-06-16 NOTE — Anesthesia Procedure Notes (Addendum)
 Procedure Name: Intubation Date/Time: 06/16/2024 11:21 AM  Performed by: Cindie Donald CROME, CRNAPre-anesthesia Checklist: Patient identified, Emergency Drugs available, Suction available and Patient being monitored Patient Re-evaluated:Patient Re-evaluated prior to induction Oxygen Delivery Method: Circle System Utilized Preoxygenation: Pre-oxygenation with 100% oxygen Induction Type: IV induction Ventilation: Mask ventilation without difficulty Laryngoscope Size: Mac and 4 Grade View: Grade I Tube type: Oral Tube size: 7.5 mm Number of attempts: 1 Airway Equipment and Method: Stylet and Oral airway Placement Confirmation: ETT inserted through vocal cords under direct vision, positive ETCO2 and breath sounds checked- equal and bilateral Secured at: 22 cm Tube secured with: Tape Dental Injury: Teeth and Oropharynx as per pre-operative assessment

## 2024-06-16 NOTE — H&P (Signed)
 "  Electrophysiology Note:   Date:  06/16/24  ID:  [name], DOB 1966/01/02, MRN [id number]   Primary Cardiologist: Lonni Hanson, MD Electrophysiologist: Fonda Kitty, MD       History of Present Illness:   Ryan [name] is a 59 y.o. 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   Discussed the use of AI scribe software for clinical note transcription with the patient, who gave verbal consent to proceed.   History of Present Illness Ryan [name] is a 59 year old male with atrial flutter and ventricular tachycardia who presents with heart fluttering and skipping. He was referred by Tylene Lunch, NP for evaluation of heart fluttering and skipping.   He experiences heart fluttering and skipping, described as 'boom, boom, boom, boom, boom, boom, boom.' These symptoms have not been felt in a while, but previously, he experienced episodes where his heart felt out of rhythm and got 'stuck there.' In March, he had an episode with a heart rate of 150 beats per minute and high blood pressure, leading to an EMS call and hospital visit after 15 minutes of persistent symptoms. He experiences these symptoms approximately twice a week, but not daily.   He is currently taking metoprolol , 25 mg once a day, changed from 12.5 mg twice a day. He has a history of a PET scan showing some scarring in the heart, and was previously told his heart pumping function was not normal, with an ejection fraction reported between 35-40%.   No chest pain, passing out, or trouble breathing. He reports occasional dizziness or  lightheadedness when standing up too fast, attributed to vertigo. He works 48 hours a week and commutes on a moped.     Interval: Patient presents today for planned ablation. Reports feeling relatively well. No new or acute complaints.    Review of systems complete and found to be negative unless listed in HPI.    EP Information / Studies Reviewed:     EKG is not ordered today. EKG from 04/03/24 reviewed which showed SR with LBBB, PR and QRS .      EKG 08/26/23: Atrial flutter    EKG 08/25/23:     Risk Assessment/Calculations:     CHA2DS2-VASc Score = 4   This indicates a 4.8% annual risk of stroke. The patient's score is based upon: CHF History: 0 HTN History: 1 Diabetes History: 0 Stroke History: 2 Vascular Disease History: 1 Age Score: 0 Gender Score: 0               Physical Exam:    Today's Vitals   06/16/24 0809  BP: 113/61  Pulse: (!) 48  Resp: 14  Temp: 97.9 F (36.6 C)  TempSrc: Oral  SpO2: 97%  Weight: 77.1 kg  Height: 5' 7 (1.702 m)   Body mass index is 26.63 kg/m.  General: Well developed, in no acute distress.  Neck: No JVD.  Cardiac: Bradycardic, regular rhythm.  Resp: Normal work of breathing.  Ext: No edema.  Neuro: No gross focal deficits.  Psych: Normal affect.    ASSESSMENT AND PLAN:     #Atrial flutter: Symptomatic. Given history of low LVEF, we  have prioritized a rhythm control strategy.  #Paroxysmal atrial fibrillation: -Discussed treatment options today for AF and AFL including antiarrhythmic drug therapy and ablation. Discussed risks, recovery and likelihood of success with each treatment strategy. Risk, benefits, and alternatives to EP study and ablation for AF and AFL were discussed. These risks include but are not limited to stroke, bleeding, vascular damage, tamponade, perforation, damage to the esophagus, lungs, phrenic nerve and other structures, pulmonary vein stenosis, worsening renal function, coronary  vasospasm and death.  Discussed potential need for repeat ablation procedures and antiarrhythmic drugs after an initial ablation. The patient understands these risk and wishes to proceed today. -Continue metoprolol  XL 12.5mg  daily. -Continue warfarin.    #PVCs and NSVT -Given h/o reduced LVEF, 35-40%, recommend loop recorder for monitoring for sustained VT. If sustained VT is observed, then patient will need ICD. Plan to implant loop recorder at time of ablation.    #Chronic systolic heart failure: Appears well compensated. #NICM - Rhythm control strategy as above.  - ILR to monitor for sustained VT. If present, patient needs ICD. - Continue GDMT regimen of lisinopril , metoprolol  and follow up with general cardiology.     Follow up with Dr. Kennyth 3 months after ablation.    Signed, Fonda Kennyth, MD      "

## 2024-06-16 NOTE — Anesthesia Postprocedure Evaluation (Signed)
"   Anesthesia Post Note  Patient: Ryan [name]  Procedure(s) Performed: A-FLUTTER ABLATION LOOP RECORDER INSERTION ATRIAL FIBRILLATION ABLATION     Patient location during evaluation: PACU Anesthesia Type: General Level of consciousness: awake and alert Pain management: pain level controlled Vital Signs Assessment: post-procedure vital signs reviewed and stable Respiratory status: spontaneous breathing, nonlabored ventilation, respiratory function stable and patient connected to nasal cannula oxygen Cardiovascular status: blood pressure returned to baseline and stable Postop Assessment: no apparent nausea or vomiting Anesthetic complications: no   No notable events documented.  Last Vitals:  Vitals:   06/16/24 1545 06/16/24 1605  BP: 101/66 95/63  Pulse: 64 61  Resp: 16 13  Temp:    SpO2: 93% 94%    Last Pain:  Vitals:   06/16/24 1521  TempSrc: Oral   Pain Goal:                   [name]      "

## 2024-06-16 NOTE — Transfer of Care (Signed)
 Immediate Anesthesia Transfer of Care Note  Patient: Ryan [name]  Procedure(s) Performed: A-FLUTTER ABLATION LOOP RECORDER INSERTION ATRIAL FIBRILLATION ABLATION  Patient Location: Cath Lab  Anesthesia Type:General  Level of Consciousness: awake, drowsy, and patient cooperative  Airway & Oxygen Therapy: Patient Spontanous Breathing  Post-op Assessment: Report given to RN and Post -op Vital signs reviewed and stable  Post vital signs: Reviewed and stable  Last Vitals:  Vitals Value Taken Time  BP 93/54 06/16/24 15:16  Temp    Pulse 60 06/16/24 15:18  Resp 20 06/16/24 15:18  SpO2 92 % 06/16/24 15:18  Vitals shown include unfiled device data.  Last Pain:  Vitals:   06/16/24 0809  TempSrc: Oral         Complications: No notable events documented.

## 2024-06-16 NOTE — Anesthesia Preprocedure Evaluation (Signed)
"                                    Anesthesia Evaluation  Patient identified by MRN, date of birth, ID band Patient awake    Reviewed: Allergy & Precautions, NPO status , Patient's Chart, lab work & pertinent test results  Airway Mallampati: I  TM Distance: >3 FB Neck ROM: Full    Dental  (+) Edentulous Upper, Edentulous Lower   Pulmonary sleep apnea    breath sounds clear to auscultation       Cardiovascular hypertension, Pt. on medications and Pt. on home beta blockers + Past MI  + Valvular Problems/Murmurs AS  Rhythm:Irregular Rate:Normal  Echo:   1. Left ventricular ejection fraction, by estimation, is 35 to 40%. The  left ventricle has moderately decreased function. Left ventricular  endocardial border not optimally defined to evaluate regional wall motion.  The left ventricular internal cavity  size was moderately dilated. There is moderate left ventricular  hypertrophy. Left ventricular diastolic parameters are indeterminate.   2. Right ventricular systolic function is mildly reduced. The right  ventricular size is normal.   3. Left atrial size was severely dilated.   4. The mitral valve is normal in structure. Mild mitral valve  regurgitation. No evidence of mitral stenosis.   5. The aortic valve is normal in structure. Aortic valve regurgitation is  not visualized. No aortic stenosis is present. There is a mechanical valve  present in the aortic position. Procedure Date: 2001.   6. The inferior vena cava is normal in size with greater than 50%  respiratory variability, suggesting right atrial pressure of 3 mmHg.     Neuro/Psych  PSYCHIATRIC DISORDERS Anxiety Depression    TIACVA, No Residual Symptoms    GI/Hepatic Neg liver ROS,GERD  ,,  Endo/Other  negative endocrine ROS    Renal/GU Renal disease     Musculoskeletal negative musculoskeletal ROS (+)    Abdominal   Peds  Hematology  (+) Blood dyscrasia, anemia   Anesthesia Other  Findings   Reproductive/Obstetrics                              Anesthesia Physical Anesthesia Plan  ASA: 3  Anesthesia Plan: General   Post-op Pain Management: Minimal or no pain anticipated   Induction: Intravenous  PONV Risk Score and Plan: 2 and Ondansetron  and Midazolam   Airway Management Planned: Oral ETT  Additional Equipment: None  Intra-op Plan:   Post-operative Plan: Extubation in OR  Informed Consent: I have reviewed the patients History and Physical, chart, labs and discussed the procedure including the risks, benefits and alternatives for the proposed anesthesia with the patient or authorized representative who has indicated his/her understanding and acceptance.       Plan Discussed with: CRNA  Anesthesia Plan Comments:          Anesthesia Quick Evaluation  "

## 2024-06-19 ENCOUNTER — Telehealth (HOSPITAL_COMMUNITY): Payer: Self-pay

## 2024-06-19 ENCOUNTER — Encounter (HOSPITAL_COMMUNITY): Payer: Self-pay | Admitting: Cardiology

## 2024-06-19 MED FILL — Cefazolin Sodium-Dextrose IV Solution 2 GM/100ML-4%: INTRAVENOUS | Qty: 100 | Status: AC

## 2024-06-19 MED FILL — Lidocaine Inj 1% w/ Epinephrine-1:100000: INTRAMUSCULAR | Qty: 20 | Status: AC

## 2024-06-19 NOTE — Telephone Encounter (Signed)
 Attempted to reach patient to follow up with procedure completed on 06/16/24, no answer. Left VM for patient to return call.

## 2024-06-19 NOTE — Telephone Encounter (Signed)
 Spoke with patient to complete post procedure follow up call.   Patient reports no complications with incision site from Implantable loop recorder or groin sites after ablation.   Instructions reviewed with patient:  It is normal to have bruising, tenderness, mild swelling, and a pea or marble sized lump/knot at the groin site which can take up to three months to resolve.  Get help right away if you notice sudden swelling at the puncture site.  Check your puncture site every day for signs of infection: fever, redness, swelling, pus drainage, warmth, foul odor or excessive pain. If this occurs, please call the office at [phone], to speak with the nurse. Get help right away if your puncture site is bleeding and the bleeding does not stop after applying firm pressure to the area.  You may continue to have skipped beats/ atrial flutter during the first several months after your procedure.  It is very important not to miss any doses of your blood thinner Warfarin.   You will follow up with the APP on 07/17/24.   Patient verbalized understanding to all instructions provided.

## 2024-06-21 ENCOUNTER — Encounter (HOSPITAL_COMMUNITY): Payer: Self-pay | Admitting: Cardiology

## 2024-06-21 ENCOUNTER — Ambulatory Visit: Attending: Internal Medicine

## 2024-06-21 DIAGNOSIS — Z5181 Encounter for therapeutic drug level monitoring: Secondary | ICD-10-CM

## 2024-06-21 DIAGNOSIS — Z952 Presence of prosthetic heart valve: Secondary | ICD-10-CM

## 2024-06-21 DIAGNOSIS — Z7901 Long term (current) use of anticoagulants: Secondary | ICD-10-CM

## 2024-06-21 LAB — POCT INR: INR: 2.9 (ref 2.0–3.0)

## 2024-06-21 NOTE — Patient Instructions (Signed)
"   Continue 1/2 tablet every day EXCEPT 1 tablet on Monday, Wednesdays and Fridays.   [phone];   Recheck INR in 4 weeks "

## 2024-06-22 ENCOUNTER — Observation Stay
Admission: EM | Admit: 2024-06-22 | Discharge: 2024-06-23 | Disposition: A | Discharge status: Home / Self Care | Attending: Internal Medicine | Admitting: Internal Medicine

## 2024-06-22 ENCOUNTER — Emergency Department

## 2024-06-22 ENCOUNTER — Encounter: Payer: Self-pay | Admitting: Intensive Care

## 2024-06-22 ENCOUNTER — Telehealth: Payer: Self-pay | Admitting: Cardiology

## 2024-06-22 ENCOUNTER — Other Ambulatory Visit: Payer: Self-pay

## 2024-06-22 ENCOUNTER — Encounter: Payer: Self-pay | Admitting: Cardiology

## 2024-06-22 DIAGNOSIS — S3012XA Contusion of groin, initial encounter: Principal | ICD-10-CM | POA: Diagnosis present

## 2024-06-22 DIAGNOSIS — N182 Chronic kidney disease, stage 2 (mild): Secondary | ICD-10-CM | POA: Diagnosis not present

## 2024-06-22 DIAGNOSIS — I10 Essential (primary) hypertension: Secondary | ICD-10-CM | POA: Diagnosis present

## 2024-06-22 DIAGNOSIS — I480 Paroxysmal atrial fibrillation: Secondary | ICD-10-CM | POA: Diagnosis not present

## 2024-06-22 DIAGNOSIS — X58XXXA Exposure to other specified factors, initial encounter: Secondary | ICD-10-CM | POA: Insufficient documentation

## 2024-06-22 DIAGNOSIS — I130 Hypertensive heart and chronic kidney disease with heart failure and stage 1 through stage 4 chronic kidney disease, or unspecified chronic kidney disease: Secondary | ICD-10-CM | POA: Diagnosis not present

## 2024-06-22 DIAGNOSIS — E785 Hyperlipidemia, unspecified: Secondary | ICD-10-CM | POA: Diagnosis present

## 2024-06-22 DIAGNOSIS — Z7982 Long term (current) use of aspirin: Secondary | ICD-10-CM | POA: Insufficient documentation

## 2024-06-22 DIAGNOSIS — Z8673 Personal history of transient ischemic attack (TIA), and cerebral infarction without residual deficits: Secondary | ICD-10-CM | POA: Insufficient documentation

## 2024-06-22 DIAGNOSIS — Z952 Presence of prosthetic heart valve: Secondary | ICD-10-CM | POA: Diagnosis not present

## 2024-06-22 DIAGNOSIS — I5022 Chronic systolic (congestive) heart failure: Secondary | ICD-10-CM | POA: Diagnosis present

## 2024-06-22 DIAGNOSIS — Z6825 Body mass index (BMI) 25.0-25.9, adult: Secondary | ICD-10-CM | POA: Insufficient documentation

## 2024-06-22 DIAGNOSIS — Z79899 Other long term (current) drug therapy: Secondary | ICD-10-CM | POA: Insufficient documentation

## 2024-06-22 DIAGNOSIS — E663 Overweight: Secondary | ICD-10-CM | POA: Diagnosis present

## 2024-06-22 DIAGNOSIS — Z7901 Long term (current) use of anticoagulants: Secondary | ICD-10-CM | POA: Insufficient documentation

## 2024-06-22 DIAGNOSIS — T148XXA Other injury of unspecified body region, initial encounter: Principal | ICD-10-CM

## 2024-06-22 DIAGNOSIS — D509 Iron deficiency anemia, unspecified: Secondary | ICD-10-CM | POA: Diagnosis not present

## 2024-06-22 HISTORY — DX: Heart failure, unspecified: I50.9

## 2024-06-22 LAB — CBC WITH DIFFERENTIAL/PLATELET
Abs Immature Granulocytes: 0.11 K/uL — ABNORMAL HIGH (ref 0.00–0.07)
Basophils Absolute: 0 K/uL (ref 0.0–0.1)
Basophils Relative: 0 %
Eosinophils Absolute: 0.2 K/uL (ref 0.0–0.5)
Eosinophils Relative: 3 %
HCT: 36.3 % — ABNORMAL LOW (ref 39.0–52.0)
Hemoglobin: 12.2 g/dL — ABNORMAL LOW (ref 13.0–17.0)
Immature Granulocytes: 2 %
Lymphocytes Relative: 22 %
Lymphs Abs: 1.4 K/uL (ref 0.7–4.0)
MCH: 32.5 pg (ref 26.0–34.0)
MCHC: 33.6 g/dL (ref 30.0–36.0)
MCV: 96.8 fL (ref 80.0–100.0)
Monocytes Absolute: 0.5 K/uL (ref 0.1–1.0)
Monocytes Relative: 8 %
Neutro Abs: 4.1 K/uL (ref 1.7–7.7)
Neutrophils Relative %: 65 %
Platelets: 168 K/uL (ref 150–400)
RBC: 3.75 MIL/uL — ABNORMAL LOW (ref 4.22–5.81)
RDW: 14 % (ref 11.5–15.5)
WBC: 6.3 K/uL (ref 4.0–10.5)
nRBC: 0 % (ref 0.0–0.2)

## 2024-06-22 LAB — COMPREHENSIVE METABOLIC PANEL WITH GFR
ALT: 13 U/L (ref 0–44)
AST: 22 U/L (ref 15–41)
Albumin: 4.2 g/dL (ref 3.5–5.0)
Alkaline Phosphatase: 81 U/L (ref 38–126)
Anion gap: 8 (ref 5–15)
BUN: 11 mg/dL (ref 6–20)
CO2: 27 mmol/L (ref 22–32)
Calcium: 9 mg/dL (ref 8.9–10.3)
Chloride: 104 mmol/L (ref 98–111)
Creatinine, Ser: 0.96 mg/dL (ref 0.61–1.24)
GFR, Estimated: >60 mL/min
Glucose, Bld: 100 mg/dL — ABNORMAL HIGH (ref 70–99)
Potassium: 4 mmol/L (ref 3.5–5.1)
Sodium: 139 mmol/L (ref 135–145)
Total Bilirubin: 0.7 mg/dL (ref 0.0–1.2)
Total Protein: 6.9 g/dL (ref 6.5–8.1)

## 2024-06-22 LAB — PROTIME-INR
INR: 2.4 — ABNORMAL HIGH (ref 0.8–1.2)
Prothrombin Time: 27.2 s — ABNORMAL HIGH (ref 11.4–15.2)

## 2024-06-22 LAB — APTT: aPTT: 40 s — ABNORMAL HIGH (ref 24–36)

## 2024-06-22 MED ORDER — LISINOPRIL 5 MG PO TABS
2.5000 mg | ORAL_TABLET | Freq: Every day | ORAL | Status: DC
  Filled 2024-06-22: qty 1

## 2024-06-22 MED ORDER — METOPROLOL SUCCINATE ER 25 MG PO TB24
12.5000 mg | ORAL_TABLET | Freq: Every day | ORAL | Status: DC
  Filled 2024-06-22: qty 1

## 2024-06-22 MED ORDER — PRAVASTATIN SODIUM 20 MG PO TABS: 20.0000 mg | ORAL_TABLET | Freq: Every day | ORAL | Status: DC

## 2024-06-22 MED ORDER — FERROUS SULFATE 325 (65 FE) MG PO TABS
650.0000 mg | ORAL_TABLET | Freq: Every day | ORAL | Status: DC
  Administered 2024-06-22: 650 mg via ORAL
  Filled 2024-06-22: qty 2

## 2024-06-22 MED ORDER — ONDANSETRON HCL 4 MG/2ML IJ SOLN: 4.0000 mg | Freq: Three times a day (TID) | INTRAMUSCULAR | Status: DC | PRN

## 2024-06-22 MED ORDER — FLUTICASONE PROPIONATE 50 MCG/ACT NA SUSP: 1.0000 | Freq: Two times a day (BID) | NASAL | Status: DC | PRN

## 2024-06-22 MED ORDER — ACETAMINOPHEN 325 MG PO TABS: 650.0000 mg | ORAL_TABLET | Freq: Four times a day (QID) | ORAL | Status: DC | PRN

## 2024-06-22 MED ORDER — HYDRALAZINE HCL 20 MG/ML IJ SOLN: 5.0000 mg | INTRAMUSCULAR | Status: DC | PRN

## 2024-06-22 MED ORDER — OXYCODONE-ACETAMINOPHEN 5-325 MG PO TABS
1.0000 | ORAL_TABLET | ORAL | Status: DC | PRN
  Administered 2024-06-22: 1 via ORAL
  Filled 2024-06-22: qty 1

## 2024-06-22 NOTE — Discharge Instructions (Signed)
 You have a hematoma, which is a collection of blood under the skin.  There is no evidence of active bleeding or what is called a pseudoaneurysm which would need surgery to repair.  Continue your medications as prescribed.  Follow-up with your cardiologist.  Return to the ER immediately for new, worsening, or persistent severe pain, swelling, increasing bruising, or any other new or worsening symptoms that concern you.

## 2024-06-22 NOTE — ED Notes (Signed)
 Patient discharge discontinued by provider. Patient notified & discharge canceled.

## 2024-06-22 NOTE — ED Provider Notes (Addendum)
 "  [facility] Provider Note    Event Date/Time   First MD Initiated Contact with Patient 06/22/24 1857     (approximate)   History   Post-op Problem   HPI  Ryan [name] is a 59 y.o. male with history of CAD, aortic dissection, LBBB, and atrial flutter status post ablation on 1/2 who presents with right groin pain, bruising, and a palpable lump since yesterday.  The patient states that for the first few days after his procedure he was not really moving around very much.  He started to have some pain 2 days ago.  Yesterday he went to shop at [employer] and did not lift anything heavy.  He was on one of the motorized shopping carts.  Suddenly he started to have more intense right groin pain.  Since that time he has noted a lump in the right groin and bruising in the right groin and on the right side of the scrotum.  I reviewed the past medical records.  The patient had a successful ablation on 1/2 with no acute complications at that time.  He was restarted on his warfarin that evening.   Physical Exam   Triage Vital Signs: ED Triage Vitals  Encounter Vitals Group     BP 06/22/24 1753 (!) 147/71     Girls Systolic BP Percentile --      Girls Diastolic BP Percentile --      Boys Systolic BP Percentile --      Boys Diastolic BP Percentile --      Pulse Rate 06/22/24 1753 62     Resp 06/22/24 1753 16     Temp 06/22/24 1753 97.8 F (36.6 C)     Temp Source 06/22/24 1753 Oral     SpO2 06/22/24 1753 100 %     Weight 06/22/24 1750 170 lb (77.1 kg)     Height 06/22/24 1750 5' 7 (1.702 m)     Head Circumference --      Peak Flow --      Pain Score 06/22/24 1750 10     Pain Loc --      Pain Education --      Exclude from Growth Chart --     Most recent vital signs: Vitals:   06/22/24 2118 06/22/24 2231  BP: 114/62 123/66  Pulse: (!) 57 (!) 58  Resp: 16 20  Temp: 98.2 F (36.8 C)   SpO2: 98% 98%     General: Awake, no distress.  CV:  Good peripheral  perfusion.  Resp:  Normal effort.  Abd:  No distention.  Other:  Large area of ecchymosis to the right half of the suprapubic area, right inguinal area, medial thigh, and right side of scrotum.  Approximately 5 cm palpable mass in the right inguinal area which is tender.  No open wounds or lesions.  No bleeding.   ED Results / Procedures / Treatments   Labs (all labs ordered are listed, but only abnormal results are displayed) Labs Reviewed  CBC WITH DIFFERENTIAL/PLATELET - Abnormal; Notable for the following components:      Result Value   RBC 3.75 (*)    Hemoglobin 12.2 (*)    HCT 36.3 (*)    Abs Immature Granulocytes 0.11 (*)    All other components within normal limits  COMPREHENSIVE METABOLIC PANEL WITH GFR - Abnormal; Notable for the following components:   Glucose, Bld 100 (*)    All other components within normal limits  PROTIME-INR - Abnormal; Notable for the following components:   Prothrombin Time 27.2 (*)    INR 2.4 (*)    All other components within normal limits  APTT - Abnormal; Notable for the following components:   aPTT 40 (*)    All other components within normal limits  CBC  PRO BRAIN NATRIURETIC PEPTIDE  HIV ANTIBODY (ROUTINE TESTING W REFLEX)  TYPE AND SCREEN     EKG     RADIOLOGY  US  RLE arterial:   IMPRESSION:  1. Mixed echogenicity collection in the palpable groin region measuring 4.2 x  2.7 cm, most compatible with a hematoma.     PROCEDURES:  Critical Care performed: No  Procedures   MEDICATIONS ORDERED IN ED: Medications  ondansetron  (ZOFRAN ) injection 4 mg (has no administration in time range)  hydrALAZINE  (APRESOLINE ) injection 5 mg (has no administration in time range)  acetaminophen  (TYLENOL ) tablet 650 mg (has no administration in time range)  oxyCODONE -acetaminophen  (PERCOCET/ROXICET) 5-325 MG per tablet 1 tablet (1 tablet Oral Given 06/22/24 2216)  lisinopril  (ZESTRIL ) tablet 2.5 mg (has no administration in time range)   pravastatin  (PRAVACHOL ) tablet 20 mg (has no administration in time range)  metoprolol  succinate (TOPROL -XL) 24 hr tablet 12.5 mg (has no administration in time range)  ferrous sulfate  tablet 650 mg (has no administration in time range)  fluticasone  (FLONASE ) 50 MCG/ACT nasal spray 1 spray (has no administration in time range)     IMPRESSION / MDM / ASSESSMENT AND PLAN / ED COURSE  I reviewed the triage vital signs and the nursing notes.  Differential diagnosis includes, but is not limited to, pseudoaneurysm, hematoma, inguinal lymphadenopathy.  CBC shows a stable hemoglobin.  CMP is unremarkable.  A right groin arterial ultrasound was obtained and the read is pending.  Patient's presentation is most consistent with acute presentation with potential threat to life or bodily function.  ----------------------------------------- 9:02 PM on 06/22/2024 -----------------------------------------  Ultrasound shows findings compatible with a hematoma.  There is no evidence of pseudoaneurysm or AV fistula.  Clinically, there is no evidence of a rapidly expanding hematoma.  The patient is stable for discharge home.  I instructed him to follow-up with a cardiologist.  I counseled him on the results of the workup and answered all of his questions.  I gave strict return precautions, and he expressed understanding.  ----------------------------------------- 9:26 PM on 06/22/2024 -----------------------------------------  I have sent a secure chat message to Dr. Kennyth from cardiology who I have been in communication with earlier about the ultrasound.  He recommended that the patient be kept overnight for observation, have someone hold pressure for 20 to 30 minutes on the groin, and he will plan for one of his partners to see him in the morning.  The patient is still here.  Therefore, I have canceled the discharge and placed an order for hospitalist  consultation.  ----------------------------------------- 10:39 PM on 06/22/2024 -----------------------------------------  I consulted Dr. Hilma from the hospitalist service; based on our discussion he agrees to evaluate the patient for admission.   FINAL CLINICAL IMPRESSION(S) / ED DIAGNOSES   Final diagnoses:  Hematoma     Rx / DC Orders   ED Discharge Orders     None        Note:  This document was prepared using Dragon voice recognition software and may include unintentional dictation errors.    Jacolyn Pae, MD 06/22/24 7896    Jacolyn Pae, MD 06/22/24 2239  "

## 2024-06-22 NOTE — ED Triage Notes (Signed)
 On January 2nd, patient had a A-FLUTTER ABLATION  and LOOP RECORDER INSERTION  ATRIAL FIBRILLATION ABLATION .  Patient now has lump in groin area of insertion site and pain

## 2024-06-22 NOTE — Telephone Encounter (Signed)
 Pt is requesting a callback regarding him having a 3 inch long thick lump in his scrotum area not on the site since having the procedure done and it's very painful. He'd like to be advised on what to do. Please advise

## 2024-06-22 NOTE — H&P (Signed)
 " History and Physical    Ryan [name] FMW:[id number] DOB: [birth year] DOA: 06/22/2024  Referring MD/NP/PA:   PCP: Melvin Pao, NP   Patient coming from:  The patient is coming from home.     Chief Complaint: Right groin swelling and pain  HPI: Ryan [name] is a 59 y.o. male with medical history significant of PAF and mechanical aortic valve on Coumadin , dissecting aortic aneurysm, HTN, HLD, sCHF with EF 35-40%, stroke, iron deficiency anemia, OSA not on CPAP, depression with anxiety, BPH, GERD, who presents with right groin swelling and pain.  Pt underwent loop recorder and A fib ablation procedures with venous access, via right femoral vein (16Fr) and left femoral vein (8Fr, 9Fr) on 06/17/2023. Initially pt did not have issues after the procedure. He started to have some right groin pain 2 days ago. Yesterday he went to shop at [employer]. He was on one of the motorized shopping carts. Suddenly he started to have more intense right groin pain and noticed bruise and swelling/lump in right groin area. Today, he had more pain and called into his cardiologist's office.  His cardiologist, Dr. Kennyth instructed him to go to the ED immediately for evaluation and to hold his evening dose of warfarin. Per Dr. Shaune note, ideally, we would run his INR closer to 2.0 rather than 3.0 in this scenario.  Pt dose not have chest pain, cough, SOB.  No nausea, vomiting, diarrhea or abdominal pain.  No symptoms UTI.  No fever or chills.  No lightheadedness.  No rectal bleeding or dark stool.  He took his last dose of Coumadin  last night.  Data reviewed independently and ED Course: pt was found to have 12.2 (13.3 on 05/17/2024) --> 11.3, INR 2.4, WBC 6.3, GFR> 60.  Temperature normal, blood pressure 114/62, heart rate 57, RR 16, oxygen saturation 98% on room air.  Patient is placed in [place] palpation.  US  lower Ext Art Right Ltd:   1. Mixed echogenicity collection in the palpable groin region measuring 4.2  x 2.7 cm, most compatible with a hematoma.     EKG:  Not done in ED, will get one.    Review of Systems:   General: no fevers, chills, no body weight gain, fatigue HEENT: no blurry vision, hearing changes or sore throat Respiratory: no dyspnea, coughing, wheezing CV: no chest pain, no palpitations GI: no nausea, vomiting, abdominal pain, diarrhea, constipation GU: no dysuria, burning on urination, increased urinary frequency, hematuria. Has bruises, swelling, pain in right groin area Ext: no leg edema Neuro: no unilateral weakness, numbness, or tingling, no vision change or hearing loss Skin: no rash, no skin tear. MSK: No muscle spasm, no deformity, no limitation of range of movement in spin Heme: No easy bruising.  Travel history: No recent long distant travel.   Allergy: Allergies[1]  Past Medical History:  Diagnosis Date   AAA (abdominal aortic aneurysm)    Acute cholecystitis 12/25/2021   Acute cystitis with hematuria 11/20/2020   Last Assessment & Plan:   Formatting of this note might be different from the original.  Patient completed a course of Keflex. Symptoms resolved. Denies dysuria, frequency, urgency, fevers, chills, abdominal pain or cramps.   Anemia    Anxiety    Aortic aneurysm and dissection (HCC)    a. 2001 s/p grafting and AVR; b. 09/2021 CTA Chest: Dil Ao root up to 51mm. Ao arch 46mm. Desc Ao 54mm. Complicated arch dissection w/ mult false and/or partially thrombosed lumens. Dissection flaps  extend into all great vessels except L carotid. Dissection cont into R CCA and into abd->L RA.   Blood transfusion without reported diagnosis    Carotid artery occlusion    05/16/2000   Cataract    CHF (congestive heart failure) (HCC)    Chronic anticoagulation    Chronic cholecystitis 01/31/2022   Chronic kidney disease    Not sure   Clotting disorder May 17 2000   Depression    Dissecting aortic aneurysm, thoracoabdominal (HCC)    Fatty liver    GERD  (gastroesophageal reflux disease)    H/O mechanical aortic valve replacement    a. 2001 s/p AVR in setting of Asc Ao aneurysm repair; b. 08/2021 Echo: EF 50-55%, no rwma, mild LVH, GrII DD, nl RV fxn, mild MR, triv AI w/ mean AoV grad .   Heart murmur    History of cardiac catheterization    a. 08/2019 MV: basal-dist lateral and inflat defect; b. 07/2020 Cath: nl cors.   Hyperlipidemia    Hypertension    Hypolipidemia    Iron deficiency    PAF (paroxysmal atrial fibrillation) (HCC)    a. CHA2DS2VASc = 3.  Chronic warfarin (also mech AVR).   Pulmonary nodule    Sleep apnea    Stroke [facility])     Past Surgical History:  Procedure Laterality Date   A-FLUTTER ABLATION N/A 06/16/2024   Procedure: A-FLUTTER ABLATION;  Surgeon: Kennyth Chew, MD;  Location: [facility] INVASIVE CV LAB;  Service: Cardiovascular;  Laterality: N/A;   ABDOMINAL AORTIC ANEURYSM REPAIR  05/17/00   ABDOMINAL SURGERY     AORTIC VALVE REPLACEMENT  2001   [employer] Mechanical   ARTHROSCOPIC REPAIR ACL     ATRIAL FIBRILLATION ABLATION N/A 06/16/2024   Procedure: ATRIAL FIBRILLATION ABLATION;  Surgeon: Kennyth Chew, MD;  Location: [facility] INVASIVE CV LAB;  Service: Cardiovascular;  Laterality: N/A;   CARDIAC VALVE REPLACEMENT  05/17/00   CARDIOVERSION N/A 08/27/2023   Procedure: CARDIOVERSION;  Surgeon: Perla Evalene PARAS, MD;  Location: [facility] ORS;  Service: Cardiovascular;  Laterality: N/A;   CHOLECYSTECTOMY  02/01/2022   COLONOSCOPY  2019   CORONARY ANGIOPLASTY     Noy sure   CORONARY ARTERY BYPASS GRAFT  05/17/00   EYE SURGERY     IR PERC CHOLECYSTOSTOMY  12/26/2021   LOOP RECORDER INSERTION N/A 06/16/2024   Procedure: LOOP RECORDER INSERTION;  Surgeon: Kennyth Chew, MD;  Location: [facility] INVASIVE CV LAB;  Service: Cardiovascular;  Laterality: N/A;   MENISCUS REPAIR      Social History:  reports that he has never smoked. He has been exposed to tobacco smoke. He has never used smokeless tobacco. He reports that he does not currently  use alcohol. He reports that he does not use drugs.  Family History:  Family History  Problem Relation Age of Onset   Heart attack Mother    Hypertension Mother    Hyperlipidemia Mother    Heart disease Mother    Diabetes Mother    Heart disease Father    Heart attack Maternal Grandfather      Prior to Admission medications  Medication Sig Start Date End Date Taking? Authorizing Provider  acetaminophen  (TYLENOL ) 500 MG tablet Take 500 mg by mouth every 6 (six) hours as needed for headache, fever, moderate pain or mild pain.    [provider]  aspirin  81 MG EC tablet Take 81 mg by mouth at bedtime.    [provider]  Ferrous Sulfate  Dried (HIGH POTENCY  IRON) 65 MG TABS Take 2 tablets by mouth at bedtime. 08/13/21   [provider]  fluticasone  (FLONASE ) 50 MCG/ACT nasal spray Place 1 spray into both nostrils 2 (two) times daily. Patient taking differently: Place 1 spray into both nostrils 2 (two) times daily as needed for allergies or rhinitis. 04/10/23   Cuthriell, Dorn BIRCH, PA-C  lisinopril  (ZESTRIL ) 2.5 MG tablet Take 1 tablet (2.5 mg total) by mouth daily. 10/26/23   Furth, Cadence H, PA-C  lovastatin  (MEVACOR ) 20 MG tablet TAKE 1 TABLET BY MOUTH AT BEDTIME 05/19/24   Melvin Pao, NP  metoprolol  succinate (TOPROL  XL) 25 MG 24 hr tablet Take 1/2 tablet (12.5 mg total) by mouth daily. 04/28/24   Gerard Frederick, NP  Potassium 99 MG TABS Take 2 tablets by mouth at bedtime.    [provider]  warfarin (COUMADIN ) 5 MG tablet TAKE 1/2 TO 1 (ONE-HALF TO ONE) TABLET BY MOUTH ONCE DAILY AS DIRECTED BY  ANTICOAGULATION  CLINIC Patient taking differently: Take 5 mg by mouth See admin instructions. Take 5 mg on Mon., Wed., and Fri., all the other day take 2.5 mg at bedtime AS DIRECTED BY  ANTICOAGULATION  CLINIC 05/08/24   End, Lonni, MD    Physical Exam: Vitals:   06/22/24 1750 06/22/24 1753 06/22/24 2118 06/22/24 2231  BP:  (!) 147/71 114/62  123/66  Pulse:  62 (!) 57 (!) 58  Resp:  16 16 20   Temp:  97.8 F (36.6 C) 98.2 F (36.8 C)   TempSrc:  Oral Oral   SpO2:  100% 98% 98%  Weight: 77.1 kg   77 kg  Height: 5' 7 (1.702 m)   5' 8 (1.727 m)   General: Not in acute distress HEENT:       Eyes: PERRL, EOMI, no jaundice       ENT: No discharge from the ears and nose, no pharynx injection, no tonsillar enlargement.        Neck: No JVD, no bruit, no mass felt. Heme: No neck lymph node enlargement. Cardiac: S1/S2, has aortic valve mechanical click sound, no gallops or rubs. Respiratory: No rales, wheezing, rhonchi or rubs. GI: Soft, nondistended, nontender, no rebound pain, no organomegaly, BS present. GU: No hematuria Ext: No pitting leg edema bilaterally. 1+DP/PT pulse bilaterally.  Has bruise, swelling, tenderness in right groin area and right side of scrotum. Musculoskeletal: No joint deformities, No joint redness or warmth, no limitation of ROM in spin. Skin: No rashes.  Neuro: Alert, oriented X3, cranial nerves II-XII grossly intact, moves all extremities normally.  Psych: Patient is not psychotic, no suicidal or hemocidal ideation.  Labs on Admission: I have personally reviewed following labs and imaging studies  CBC: Recent Labs  Lab 06/22/24 1754 06/23/24 0044  WBC 6.3 5.8  NEUTROABS 4.1  --   HGB 12.2* 11.3*  HCT 36.3* 33.3*  MCV 96.8 95.4  PLT 168 148*   Basic Metabolic Panel: Recent Labs  Lab 06/22/24 1754  NA 139  K 4.0  CL 104  CO2 27  GLUCOSE 100*  BUN 11  CREATININE 0.96  CALCIUM 9.0   GFR: Estimated Creatinine Clearance: 83.1 mL/min (by C-G formula based on SCr of 0.96 mg/dL). Liver Function Tests: Recent Labs  Lab 06/22/24 1754  AST 22  ALT 13  ALKPHOS 81  BILITOT 0.7  PROT 6.9  ALBUMIN 4.2   No results for input(s): LIPASE, AMYLASE in the last 168 hours. No results for input(s): AMMONIA in the last  168 hours. Coagulation Profile: Recent Labs  Lab 06/16/24 0850  06/21/24 1039 06/22/24 1754  INR 3.3* 2.9 2.4*   Cardiac Enzymes: No results for input(s): CKTOTAL, CKMB, CKMBINDEX, TROPONINI in the last 168 hours. BNP (last 3 results) Recent Labs    06/22/24 1754  PROBNP 384.0*   HbA1C: No results for input(s): HGBA1C in the last 72 hours. CBG: No results for input(s): GLUCAP in the last 168 hours. Lipid Profile: No results for input(s): CHOL, HDL, LDLCALC, TRIG, CHOLHDL, LDLDIRECT in the last 72 hours. Thyroid  Function Tests: No results for input(s): TSH, T4TOTAL, FREET4, T3FREE, THYROIDAB in the last 72 hours. Anemia Panel: No results for input(s): VITAMINB12, FOLATE, FERRITIN, TIBC, IRON, RETICCTPCT in the last 72 hours. Urine analysis:    Component Value Date/Time   COLORURINE YELLOW (A) 04/10/2023 2038   APPEARANCEUR Cloudy (A) 08/11/2023 1115   LABSPEC 1.009 04/10/2023 2038   PHURINE 7.0 04/10/2023 2038   GLUCOSEU Negative 08/11/2023 1115   HGBUR MODERATE (A) 04/10/2023 2038   BILIRUBINUR Negative 08/11/2023 1115   KETONESUR NEGATIVE 04/10/2023 2038   PROTEINUR 2+ (A) 08/11/2023 1115   PROTEINUR NEGATIVE 04/10/2023 2038   NITRITE Positive (A) 08/11/2023 1115   NITRITE NEGATIVE 04/10/2023 2038   LEUKOCYTESUR 3+ (A) 08/11/2023 1115   LEUKOCYTESUR NEGATIVE 04/10/2023 2038   Sepsis Labs: @LABRCNTIP (procalcitonin:4,lacticidven:4) )No results found for this or any previous visit (from the past 240 hours).   Radiological Exams on Admission:   Assessment/Plan Principal Problem:   Hematoma of groin_right groin Active Problems:   Status post mechanical aortic valve replacement   PAF (paroxysmal atrial fibrillation) (HCC)   Chronic HFrEF (heart failure with reduced ejection fraction) (HCC)   Essential hypertension   History of CVA (cerebrovascular accident)   HLD (hyperlipidemia)   CKD (chronic kidney disease) stage 2, GFR 60-89 ml/min   Iron deficiency anemia   Overweight (BMI  25.0-29.9)   Assessment and Plan:  Hematoma of groin_right groin: Hgb 13.3 on 05/17/24 --> 12.2 --> 11.3.  Hemoglobin stable.  INR 2.4.  PTT 40. - Placed in telemetry bed for patient - Hold Coumadin  and aspirin  - Type screen - Follow-up CBC every 6 hours --> goal for transfusion is hemoglobin less than 7.  Status post mechanical aortic valve replacement: INR 2.4. -Temporarily hold Coumadin   PAF (paroxysmal atrial fibrillation) (HCC): Heart rate 57. -Metoprolol  - Hold Coumadin   Chronic HFrEF (heart failure with reduced ejection fraction) (HCC): Today: 09/08/2023 showed EF of 35-40%.  Patient does not have leg edema or JVD.  CHF is compensated. -Watch volume status closely. -check BNP --> 384  Essential hypertension -IV hydralazine  as needed - Lisinopril  and metoprolol   History of CVA (cerebrovascular accident) -Hold aspirin  and Eliquis  - Switch lovastatin  to pravastatin  in hospital  HLD (hyperlipidemia) -Pravastatin   CKD (chronic kidney disease) stage 2, GFR 60-89 ml/min: Renal function stable.  GFR> 60. -Observe closely  Iron deficiency anemia -Continue iron supplement - Follow-up CBC every 6 hours as above  Overweight (BMI 25.0-29.9): Body weight 77 kg, BMI 25.81. - Encourage losing weight - Exercise healthy diet       DVT ppx: none (pt has INR of  2.4)  Code Status: Full code  Family Communication:     not done, no family member is at bed side.     Disposition Plan:  Anticipate discharge back to previous environment  Consults called: Dr. Kennyth for cardiology is aware of this patient admission  Admission status and Level of care: Telemetry:  for obs     Dispo: The patient is from: Home              Anticipated d/c is to: Home              Anticipated d/c date is: 1 day              Patient currently is not medically stable to d/c.    Severity of Illness:  The appropriate patient status for this patient is OBSERVATION. Observation status is  judged to be reasonable and necessary in order to provide the required intensity of service to ensure the patient's safety. The patient's presenting symptoms, physical exam findings, and initial radiographic and laboratory data in the context of their medical condition is felt to place them at decreased risk for further clinical deterioration. Furthermore, it is anticipated that the patient will be medically stable for discharge from the hospital within 2 midnights of admission.        Date of Service 06/23/2024    Caleb Exon [facility]   If 7PM-7AM, please contact night-coverage [email or web address] 06/23/2024, 1:52 AM     [1]  Allergies Allergen Reactions   Alitraq Rash   Iodinated Contrast Media Rash   Iodine Rash    CT dye   "

## 2024-06-22 NOTE — Telephone Encounter (Signed)
 Spoke with patient regarding right groin swelling and bruising. He underwent ablation on 1/2 with venous access - right femoral vein (16Fr) and left femoral vein (8Fr, 9Fr). He had no issues over the weekend. Had post op call on Monday (1/5) with no issues. At some point, later in the week he developed swelling in the right groin associated with bruising. He went to doctor's appointments yesterday and [employer]. Today, he had more pain and called into the office. I instructed him to go to the ED immediately for evaluation and to hold his evening dose of warfarin. Ideally, we would run his INR closer to 2.0 rather than 3.0 in this scenario.   Fonda Kitty, MD, [facility], [facility] Cardiac Electrophysiology

## 2024-06-22 NOTE — Telephone Encounter (Signed)
 See MyChart messages.

## 2024-06-23 DIAGNOSIS — I5022 Chronic systolic (congestive) heart failure: Secondary | ICD-10-CM | POA: Diagnosis not present

## 2024-06-23 DIAGNOSIS — Z8673 Personal history of transient ischemic attack (TIA), and cerebral infarction without residual deficits: Secondary | ICD-10-CM | POA: Diagnosis not present

## 2024-06-23 DIAGNOSIS — I480 Paroxysmal atrial fibrillation: Secondary | ICD-10-CM | POA: Diagnosis not present

## 2024-06-23 DIAGNOSIS — E785 Hyperlipidemia, unspecified: Secondary | ICD-10-CM | POA: Diagnosis not present

## 2024-06-23 DIAGNOSIS — I10 Essential (primary) hypertension: Secondary | ICD-10-CM | POA: Diagnosis not present

## 2024-06-23 DIAGNOSIS — Z952 Presence of prosthetic heart valve: Secondary | ICD-10-CM | POA: Diagnosis not present

## 2024-06-23 DIAGNOSIS — S3012XA Contusion of groin, initial encounter: Secondary | ICD-10-CM | POA: Diagnosis not present

## 2024-06-23 LAB — CBC
HCT: 33.3 % — ABNORMAL LOW (ref 39.0–52.0)
HCT: 33.2 % — ABNORMAL LOW (ref 39.0–52.0)
HCT: 34.1 % — ABNORMAL LOW (ref 39.0–52.0)
Hemoglobin: 11.3 g/dL — ABNORMAL LOW (ref 13.0–17.0)
Hemoglobin: 11.2 g/dL — ABNORMAL LOW (ref 13.0–17.0)
Hemoglobin: 11.7 g/dL — ABNORMAL LOW (ref 13.0–17.0)
MCH: 32.4 pg (ref 26.0–34.0)
MCH: 32.4 pg (ref 26.0–34.0)
MCH: 32.1 pg (ref 26.0–34.0)
MCHC: 33.9 g/dL (ref 30.0–36.0)
MCHC: 33.7 g/dL (ref 30.0–36.0)
MCHC: 34.3 g/dL (ref 30.0–36.0)
MCV: 95.4 fL (ref 80.0–100.0)
MCV: 96 fL (ref 80.0–100.0)
MCV: 93.7 fL (ref 80.0–100.0)
Platelets: 148 K/uL — ABNORMAL LOW (ref 150–400)
Platelets: 150 K/uL (ref 150–400)
Platelets: 149 K/uL — ABNORMAL LOW (ref 150–400)
RBC: 3.49 MIL/uL — ABNORMAL LOW (ref 4.22–5.81)
RBC: 3.46 MIL/uL — ABNORMAL LOW (ref 4.22–5.81)
RBC: 3.64 MIL/uL — ABNORMAL LOW (ref 4.22–5.81)
RDW: 14 % (ref 11.5–15.5)
RDW: 14 % (ref 11.5–15.5)
RDW: 14 % (ref 11.5–15.5)
WBC: 5.8 K/uL (ref 4.0–10.5)
WBC: 5.1 K/uL (ref 4.0–10.5)
WBC: 4.5 K/uL (ref 4.0–10.5)
nRBC: 0 % (ref 0.0–0.2)
nRBC: 0 % (ref 0.0–0.2)
nRBC: 0 % (ref 0.0–0.2)

## 2024-06-23 LAB — TYPE AND SCREEN
ABO/RH(D): O POS
Antibody Screen: NEGATIVE

## 2024-06-23 LAB — PROTIME-INR
INR: 2.5 — ABNORMAL HIGH (ref 0.8–1.2)
Prothrombin Time: 28 s — ABNORMAL HIGH (ref 11.4–15.2)

## 2024-06-23 LAB — PRO BRAIN NATRIURETIC PEPTIDE: Pro Brain Natriuretic Peptide: 384 pg/mL — ABNORMAL HIGH

## 2024-06-23 LAB — HIV ANTIBODY (ROUTINE TESTING W REFLEX): HIV Screen 4th Generation wRfx: NONREACTIVE

## 2024-06-23 MED ORDER — WARFARIN SODIUM 5 MG PO TABS: 2.5000 mg | ORAL_TABLET | Freq: Every day | ORAL | Status: DC

## 2024-06-23 NOTE — Discharge Summary (Signed)
 " Physician Discharge Summary   Patient: Ryan [name] MRN: [id number] DOB: [birth year]  Admit date:     06/22/2024  Discharge date: 06/23/2024  Discharge Physician: Ryan [name]   PCP: Ryan Pao, NP   Recommendations at discharge:    Pt to be discharged home.   If you experience worsening fever, chills, chest pain, shortness of breath, or other concerning symptoms, please call your PCP or go to the emergency department immediately.  Discharge Diagnoses: Principal Problem:   Hematoma of groin_right groin Active Problems:   Status post mechanical aortic valve replacement   PAF (paroxysmal atrial fibrillation) (HCC)   Chronic HFrEF (heart failure with reduced ejection fraction) (HCC)   Essential hypertension   History of CVA (cerebrovascular accident)   HLD (hyperlipidemia)   CKD (chronic kidney disease) stage 2, GFR 60-89 ml/min   Iron deficiency anemia   Overweight (BMI 25.0-29.9)  Resolved Problems:   * No resolved hospital problems. *   Hospital Course:   59 y.o. male with medical history significant of PAF and mechanical aortic valve on Coumadin , dissecting aortic aneurysm, HTN, HLD, sCHF with EF 35-40%, stroke, iron deficiency anemia, OSA not on CPAP, depression with anxiety, BPH, GERD, who presents with right groin swelling and pain.   Pt underwent loop recorder and A fib ablation procedures with venous access, via right femoral vein (16Fr) and left femoral vein (8Fr, 9Fr) on 06/17/2023. Initially pt did not have issues after the procedure. He started to have some right groin pain 2 days ago. Yesterday he went to shop at [employer]. He was on one of the motorized shopping carts. Suddenly he started to have more intense right groin pain and noticed bruise and swelling/lump in right groin area. Today, he had more pain and called into his cardiologist's office.  His cardiologist, Dr. Kennyth instructed him to go to the ED immediately for evaluation and to hold his evening dose of  warfarin. Per Dr. Shaune note, ideally, we would run his INR closer to 2.0 rather than 3.0 in this scenario.   Pt dose not have chest pain, cough, SOB.  No nausea, vomiting, diarrhea or abdominal pain.  No symptoms UTI.  No fever or chills.  No lightheadedness.  No rectal bleeding or dark stool.  He took his last dose of Coumadin  last night.   Data reviewed independently and ED Course: pt was found to have 12.2 (13.3 on 05/17/2024) --> 11.3, INR 2.4, WBC 6.3, GFR> 60.  Temperature normal, blood pressure 114/62, heart rate 57, RR 16, oxygen saturation 98% on room air.  Patient is placed in [place] palpation.   US  lower Ext Art Right Ltd:   1. Mixed echogenicity collection in the palpable groin region measuring 4.2 x 2.7 cm, most compatible with a hematoma.  Assessment and Plan:  Right groin hematoma - Evaluated by cardiology.  Appears to be stable for now.  Warfarin held initially to allow INR to drift closer to 2.0.  Aspirin  held as well.  Pain improved.  Per recommendations from cardiology and pharmacy, plan to restart warfarin at 2.5 mg daily beginning tomorrow (Saturday 1/10).  Will recheck INR at normally scheduled visit on Wednesday along with follow-up with cardiology clinic that day as well.  Monitor for worsening swelling, pain, bleeding.  Mechanical aortic valve replacement - Temporary INR goal closer to 2.0 for the time being.  Chronic HFrEF - Does not appear to be in acute exacerbation.  Can restart home regimen.  Hypertension/hyperlipidemia - Restart home  regimen.  History of CVA - Statin on board.  Holding aspirin  for time being as above.     Consultants: Cardiology Procedures performed: None Disposition: Home Diet recommendation:  Cardiac diet  DISCHARGE MEDICATION: Allergies as of 06/23/2024       Reactions   Alitraq Rash   Iodinated Contrast Media Rash   Iodine Rash   CT dye        Medication List     STOP taking these medications    aspirin  EC 81  MG tablet       TAKE these medications    acetaminophen  500 MG tablet Commonly known as: TYLENOL  Take 500 mg by mouth every 6 (six) hours as needed for headache, fever, moderate pain or mild pain.   fluticasone  50 MCG/ACT nasal spray Commonly known as: FLONASE  Place 1 spray into both nostrils 2 (two) times daily. What changed:  when to take this reasons to take this   High Potency Iron 65 MG Tabs Take 2 tablets by mouth at bedtime.   lisinopril  2.5 MG tablet Commonly known as: ZESTRIL  Take 1 tablet (2.5 mg total) by mouth daily.   lovastatin  20 MG tablet Commonly known as: MEVACOR  TAKE 1 TABLET BY MOUTH AT BEDTIME   metoprolol  succinate 25 MG 24 hr tablet Commonly known as: Toprol  XL Take 1/2 tablet (12.5 mg total) by mouth daily.   Potassium 99 MG Tabs Take 2 tablets by mouth at bedtime.   warfarin 5 MG tablet Commonly known as: COUMADIN  Take as directed. If you are unsure how to take this medication, talk to your nurse or doctor. Original instructions: Take 0.5 tablets (2.5 mg total) by mouth daily at 4 PM. TAKE 1/2 TABLET BY MOUTH ONCE DAILY AS DIRECTED BY  ANTICOAGULATION  CLINIC What changed:  how much to take how to take this when to take this additional instructions        Follow-up Information     Go to  [facility] Emergency Department at [facility].   Specialty: Emergency Medicine Why: If symptoms worsen Contact information: [address] [place]  [address] [phone]                Discharge Exam: Ryan [name]   06/22/24 1750 06/22/24 2231 06/23/24 0500  Weight: 77.1 kg 77 kg 77 kg    GENERAL:  Alert, pleasant, no acute distress  HEENT:  EOMI CARDIOVASCULAR:  RRR, no murmurs appreciated RESPIRATORY:  Clear to auscultation, no wheezing, rales, or rhonchi GASTROINTESTINAL:  Soft, nontender, nondistended EXTREMITIES:  No LE edema bilaterally NEURO:  No new focal deficits appreciated SKIN: Right  groin ecchymosis with small firm induration, mildly tender PSYCH:  Appropriate mood and affect     Condition at discharge: improving  The results of significant diagnostics from this hospitalization (including imaging, microbiology, ancillary and laboratory) are listed below for reference.   Imaging Studies: US  Lower Ext Art Right Ltd Result Date: 06/22/2024 EXAM: ULTRASOUND OF THE ARTERIAL SYSTEM OF THE BILATERAL LOWER EXTREMITIES 06/22/2024 06:45:35 PM TECHNIQUE: Duplex ultrasound using B-mode/gray scaled imaging, Doppler spectral analysis and color flow Doppler was obtained of the bilateral lower extremities. COMPARISON: None available. CLINICAL HISTORY: Lump in the groin. FINDINGS: No evidence of arterial pseudoaneurysm or av fistula. . Soft Tissues: There is a mixed echogenicity collection anterior to the blood vessels in the palpable area. This measures 4.2 x 2.7 cm and is most compatible with a hematoma. IMPRESSION: 1. Mixed echogenicity collection in the palpable groin region  measuring 4.2 x 2.7 cm, most compatible with a hematoma. Electronically signed by: Franky Crease MD MD 06/22/2024 08:25 PM EST RP Workstation: [id number]   EP STUDY Addendum Date: 06/21/2024 CONCLUSIONS: 1. Successful PVI. 2. 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 3. Successful ablation of the lateral mitral isthmus for atypical perimitral flutter. 4. Successful ablation of the cavotricuspid isthmus with bidirectional block achieved. 5. Intracardiac echo reveals mildly reduced LV systolic function, trivial pericardial effusion and mechanical aortic valve. 6. Successful loop recorder implant. 7. No early apparent complications. 8. Resume warfarin this evening. Fonda Kitty, MD, [facility], [facility] Cardiac Electrophysiology   Result Date: 06/21/2024 CONCLUSIONS: 1. Successful PVI. 2. 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 3. Successful ablation of the lateral mitral isthmus for atypical perimitral flutter. 4. Successful ablation of the cavotricuspid isthmus with bidirectional block achieved. 5. Intracardiac echo reveals mildly reduced LV systolic function, trivial pericardial effusion and mechanical aortic valve. 6. Successful loop recorder implant. 7. No early apparent complications. 8. Resume warfarin this evening. Fonda Kitty, MD, [facility], [facility] Cardiac Electrophysiology   EP PPM/ICD IMPLANT Addendum Date: 06/21/2024 CONCLUSIONS: 1. Successful PVI. 2. 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 3. Successful ablation of the lateral mitral isthmus for atypical perimitral flutter. 4. Successful ablation of the cavotricuspid isthmus with bidirectional block achieved. 5. Intracardiac echo reveals mildly reduced LV systolic function, trivial pericardial effusion and mechanical aortic valve. 6. Successful loop recorder implant. 7. No early apparent complications. 8. Resume warfarin this evening. Fonda Kitty, MD, [facility], [facility] Cardiac Electrophysiology   Result Date: 06/21/2024 CONCLUSIONS: 1. Successful PVI. 2. 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 3. Successful ablation of the lateral mitral isthmus for atypical perimitral flutter. 4. Successful ablation of the cavotricuspid isthmus with bidirectional block achieved. 5. Intracardiac echo reveals mildly reduced LV systolic function, trivial pericardial effusion and mechanical aortic valve. 6. Successful loop recorder implant. 7. No early apparent complications. 8. Resume warfarin this evening. Fonda Kitty, MD, [facility], [facility] Cardiac Electrophysiology    Microbiology: Results for orders placed or performed during the hospital encounter of 08/25/23  Resp panel by RT-PCR (RSV, Flu A&B, Covid) Anterior Nasal Swab     Status: None   Collection  Time: 08/27/23  9:59 PM   Specimen: Anterior Nasal Swab  Result Value Ref Range Status   SARS Coronavirus 2 by RT PCR NEGATIVE NEGATIVE Final    Comment: (NOTE) SARS-CoV-2 target nucleic acids are NOT DETECTED.  The SARS-CoV-2 RNA is generally detectable in upper respiratory specimens during the acute phase of infection. The lowest concentration of SARS-CoV-2 viral copies this assay can detect is 138 copies/mL. A negative result does not preclude SARS-Cov-2 infection and should not be used as the sole basis for treatment or other patient management decisions. A negative result may occur with  improper specimen collection/handling, submission of specimen other than nasopharyngeal swab, presence of viral mutation(s) within the areas targeted by this assay, and inadequate number of viral copies(<138 copies/mL). A negative result must be combined with clinical observations, patient history, and epidemiological information. The expected result is Negative.  Fact Sheet for Patients:  bloggercourse.com  Fact Sheet for Healthcare Providers:  seriousbroker.it  This test is no t yet approved or cleared by the [place]  FDA and  has been authorized for detection and/or diagnosis  of SARS-CoV-2 by FDA under an Emergency Use Authorization (EUA). This EUA will remain  in effect (meaning this test can be used) for the duration of the COVID-19 declaration under Section 564(b)(1) of the Act, 21 U.S.C.section 360bbb-3(b)(1), unless the authorization is terminated  or revoked sooner.       Influenza A by PCR NEGATIVE NEGATIVE Final   Influenza B by PCR NEGATIVE NEGATIVE Final    Comment: (NOTE) The Xpert Xpress SARS-CoV-2/FLU/RSV plus assay is intended as an aid in the diagnosis of influenza from Nasopharyngeal swab specimens and should not be used as a sole basis for treatment. Nasal washings and aspirates are unacceptable for Xpert Xpress  SARS-CoV-2/FLU/RSV testing.  Fact Sheet for Patients: bloggercourse.com  Fact Sheet for Healthcare Providers: seriousbroker.it  This test is not yet approved or cleared by the [place]  FDA and has been authorized for detection and/or diagnosis of SARS-CoV-2 by FDA under an Emergency Use Authorization (EUA). This EUA will remain in effect (meaning this test can be used) for the duration of the COVID-19 declaration under Section 564(b)(1) of the Act, 21 U.S.C. section 360bbb-3(b)(1), unless the authorization is terminated or revoked.     Resp Syncytial Virus by PCR NEGATIVE NEGATIVE Final    Comment: (NOTE) Fact Sheet for Patients: bloggercourse.com  Fact Sheet for Healthcare Providers: seriousbroker.it  This test is not yet approved or cleared by the [place]  FDA and has been authorized for detection and/or diagnosis of SARS-CoV-2 by FDA under an Emergency Use Authorization (EUA). This EUA will remain in effect (meaning this test can be used) for the duration of the COVID-19 declaration under Section 564(b)(1) of the Act, 21 U.S.C. section 360bbb-3(b)(1), unless the authorization is terminated or revoked.  Performed at [facility], [address] [place], [place] [address]   Respiratory (~20 pathogens) panel by PCR     Status: None   Collection Time: 08/27/23 11:02 PM   Specimen: Nasopharyngeal Swab; Respiratory  Result Value Ref Range Status   Adenovirus NOT DETECTED NOT DETECTED Final   Coronavirus 229E NOT DETECTED NOT DETECTED Final    Comment: (NOTE) The Coronavirus on the Respiratory Panel, DOES NOT test for the novel  Coronavirus (2019 nCoV)    Coronavirus HKU1 NOT DETECTED NOT DETECTED Final   Coronavirus NL63 NOT DETECTED NOT DETECTED Final   Coronavirus OC43 NOT DETECTED NOT DETECTED Final   Metapneumovirus NOT DETECTED NOT DETECTED Final    Rhinovirus / Enterovirus NOT DETECTED NOT DETECTED Final   Influenza A NOT DETECTED NOT DETECTED Final   Influenza B NOT DETECTED NOT DETECTED Final   Parainfluenza Virus 1 NOT DETECTED NOT DETECTED Final   Parainfluenza Virus 2 NOT DETECTED NOT DETECTED Final   Parainfluenza Virus 3 NOT DETECTED NOT DETECTED Final   Parainfluenza Virus 4 NOT DETECTED NOT DETECTED Final   Respiratory Syncytial Virus NOT DETECTED NOT DETECTED Final   Bordetella pertussis NOT DETECTED NOT DETECTED Final   Bordetella Parapertussis NOT DETECTED NOT DETECTED Final   Chlamydophila pneumoniae NOT DETECTED NOT DETECTED Final   Mycoplasma pneumoniae NOT DETECTED NOT DETECTED Final    Comment: Performed at [facility], [address] [place], [place] [address]    Labs: CBC: Recent Labs  Lab 06/22/24 1754 06/23/24 0044 06/23/24 0543 06/23/24 1218  WBC 6.3 5.8 5.1 4.5  NEUTROABS 4.1  --   --   --   HGB 12.2* 11.3* 11.2* 11.7*  HCT 36.3* 33.3* 33.2* 34.1*  MCV 96.8 95.4 96.0  93.7  PLT 168 148* 150 149*   Basic Metabolic Panel: Recent Labs  Lab 06/22/24 1754  NA 139  K 4.0  CL 104  CO2 27  GLUCOSE 100*  BUN 11  CREATININE 0.96  CALCIUM 9.0   Liver Function Tests: Recent Labs  Lab 06/22/24 1754  AST 22  ALT 13  ALKPHOS 81  BILITOT 0.7  PROT 6.9  ALBUMIN 4.2   CBG: No results for input(s): GLUCAP in the last 168 hours.  Discharge time spent: 36 minutes.  Length of inpatient stay: 0 days  Signed: Carliss LELON Canales, DO [facility] 06/23/2024         "

## 2024-06-23 NOTE — Plan of Care (Signed)

## 2024-06-23 NOTE — Consult Note (Cosign Needed)
 "   ELECTROPHYSIOLOGY CONSULT NOTE    Patient ID: Ryan [name] MRN: [id number], DOB/AGE: [id number] y.o.  Admit date: 06/22/2024 Date of Consult: 06/23/2024  Primary Physician: Melvin Pao, NP Primary Cardiologist: Lonni Hanson, MD  Electrophysiologist: Dr. Kennyth   Referring Provider: @ATTENDING @  Patient Profile: Ryan [name] is a 59 y.o. male with a history of non obs CAD, aortic dissection s/p mechanical AVR (on warfarin), LBBB, CVA/TIA, parox AFib, HTN, HLD, OSA who is being seen today for the evaluation of groin hematoma after AF ablation at the request of Dr. Arlon.  HPI:  Ryan [name] is a 59 y.o. male with PMH as above who presented ot [facility] ER 1/8 with groin swelling after afib, aflutter ablation 1/2 by Dr. Kennyth. He states that over the weekend he was feeling very well, was minimally active as recommended. He rec'd post-op phone call Monday (1/5).  After this he liberalized his activities Tuesday and noticed a small knot in R groin. Wednesday, he again ran errands around town and went to [facility] and by Wednesday evening noticed the knot was much larger and painful in R groin. He notified our office of this and plans were made for an ultrasound.   He continued to have increased pain to the area, and so was recommended to proceed to ER for further evaluation.   In ER, ultrasound negative for pseudoanuerysm. INR found to be 2.4, though has been running closer to 3. A nurse applied pressure for 30 minutes and he noted the area felt less painful and the swelling improved.   Currently, the R groin is very tender to palpitation. He has full sensation to R leg/foot. He has no discomfort or swelling to L groin.     Labs Potassium4.0 (01/08 1754)   Creatinine, ser  0.96 (01/08 1754) PLT  150 (01/09 0543) HGB  11.2* (01/09 0543) WBC 5.1 (01/09 0543)  .    Past Medical History:  Diagnosis Date   AAA (abdominal aortic aneurysm)    Acute cholecystitis 12/25/2021   Acute  cystitis with hematuria 11/20/2020   Last Assessment & Plan:   Formatting of this note might be different from the original.  Patient completed a course of Keflex. Symptoms resolved. Denies dysuria, frequency, urgency, fevers, chills, abdominal pain or cramps.   Anemia    Anxiety    Aortic aneurysm and dissection (HCC)    a. 2001 s/p grafting and AVR; b. 09/2021 CTA Chest: Dil Ao root up to 51mm. Ao arch 46mm. Desc Ao 54mm. Complicated arch dissection w/ mult false and/or partially thrombosed lumens. Dissection flaps extend into all great vessels except L carotid. Dissection cont into R CCA and into abd->L RA.   Blood transfusion without reported diagnosis    Carotid artery occlusion    05/16/2000   Cataract    CHF (congestive heart failure) (HCC)    Chronic anticoagulation    Chronic cholecystitis 01/31/2022   Chronic kidney disease    Not sure   Clotting disorder May 17 2000   Depression    Dissecting aortic aneurysm, thoracoabdominal (HCC)    Fatty liver    GERD (gastroesophageal reflux disease)    H/O mechanical aortic valve replacement    a. 2001 s/p AVR in setting of Asc Ao aneurysm repair; b. 08/2021 Echo: EF 50-55%, no rwma, mild LVH, GrII DD, nl RV fxn, mild MR, triv AI w/ mean AoV grad .   Heart murmur    History of cardiac catheterization  a. 08/2019 MV: basal-dist lateral and inflat defect; b. 07/2020 Cath: nl cors.   Hyperlipidemia    Hypertension    Hypolipidemia    Iron deficiency    PAF (paroxysmal atrial fibrillation) (HCC)    a. CHA2DS2VASc = 3.  Chronic warfarin (also mech AVR).   Pulmonary nodule    Sleep apnea    Stroke [facility])      Surgical History:  Past Surgical History:  Procedure Laterality Date   A-FLUTTER ABLATION N/A 06/16/2024   Procedure: A-FLUTTER ABLATION;  Surgeon: Kennyth Chew, MD;  Location: [facility] INVASIVE CV LAB;  Service: Cardiovascular;  Laterality: N/A;   ABDOMINAL AORTIC ANEURYSM REPAIR  05/17/00   ABDOMINAL SURGERY     AORTIC VALVE  REPLACEMENT  2001   [employer] Mechanical   ARTHROSCOPIC REPAIR ACL     ATRIAL FIBRILLATION ABLATION N/A 06/16/2024   Procedure: ATRIAL FIBRILLATION ABLATION;  Surgeon: Kennyth Chew, MD;  Location: [facility] INVASIVE CV LAB;  Service: Cardiovascular;  Laterality: N/A;   CARDIAC VALVE REPLACEMENT  05/17/00   CARDIOVERSION N/A 08/27/2023   Procedure: CARDIOVERSION;  Surgeon: Perla Evalene PARAS, MD;  Location: [facility] ORS;  Service: Cardiovascular;  Laterality: N/A;   CHOLECYSTECTOMY  02/01/2022   COLONOSCOPY  2019   CORONARY ANGIOPLASTY     Noy sure   CORONARY ARTERY BYPASS GRAFT  05/17/00   EYE SURGERY     IR PERC CHOLECYSTOSTOMY  12/26/2021   LOOP RECORDER INSERTION N/A 06/16/2024   Procedure: LOOP RECORDER INSERTION;  Surgeon: Kennyth Chew, MD;  Location: [facility] INVASIVE CV LAB;  Service: Cardiovascular;  Laterality: N/A;   MENISCUS REPAIR       Medications Prior to Admission  Medication Sig Dispense Refill Last Dose/Taking   acetaminophen  (TYLENOL ) 500 MG tablet Take 500 mg by mouth every 6 (six) hours as needed for headache, fever, moderate pain or mild pain.   Taking As Needed   aspirin  81 MG EC tablet Take 81 mg by mouth at bedtime.   06/21/2024   Ferrous Sulfate  Dried (HIGH POTENCY IRON) 65 MG TABS Take 2 tablets by mouth at bedtime.   06/21/2024   fluticasone  (FLONASE ) 50 MCG/ACT nasal spray Place 1 spray into both nostrils 2 (two) times daily. (Patient taking differently: Place 1 spray into both nostrils 2 (two) times daily as needed for allergies or rhinitis.) 16 g 0 Taking Differently   lisinopril  (ZESTRIL ) 2.5 MG tablet Take 1 tablet (2.5 mg total) by mouth daily. 90 tablet 3 06/21/2024   lovastatin  (MEVACOR ) 20 MG tablet TAKE 1 TABLET BY MOUTH AT BEDTIME 90 tablet 0 06/21/2024   metoprolol  succinate (TOPROL  XL) 25 MG 24 hr tablet Take 1/2 tablet (12.5 mg total) by mouth daily. 45 tablet 3 06/21/2024   Potassium 99 MG TABS Take 2 tablets by mouth at bedtime.   06/21/2024   warfarin (COUMADIN ) 5 MG tablet  TAKE 1/2 TO 1 (ONE-HALF TO ONE) TABLET BY MOUTH ONCE DAILY AS DIRECTED BY  ANTICOAGULATION  CLINIC (Patient taking differently: Take 5 mg by mouth See admin instructions. Take 5 mg on Mon., Wed., and Fri., all the other day take 2.5 mg at bedtime AS DIRECTED BY  ANTICOAGULATION  CLINIC) 90 tablet 1 06/21/2024    Inpatient Medications:   ferrous sulfate   650 mg Oral QHS   lisinopril   2.5 mg Oral Daily   metoprolol  succinate  12.5 mg Oral Daily   pravastatin   20 mg Oral q1800    Allergies: Allergies[1]  Family History  Problem Relation  Age of Onset   Heart attack Mother    Hypertension Mother    Hyperlipidemia Mother    Heart disease Mother    Diabetes Mother    Heart disease Father    Heart attack Maternal Grandfather      Physical Exam: Vitals:   06/22/24 2231 06/23/24 0500 06/23/24 0613 06/23/24 0731  BP: 123/66  (!) 105/56 104/66  Pulse: (!) 58  (!) 47 (!) 48  Resp: 20  18 16   Temp:   98.1 F (36.7 C) 97.9 F (36.6 C)  TempSrc:      SpO2: 98%  97% 97%  Weight: 77 kg 77 kg    Height: 5' 8 (1.727 m)       GEN- NAD, A&O x 3, normal affect HEENT: Normocephalic, atraumatic Lungs- CTAB, Normal effort.  Heart- Regular rate and rhythm, No M/G/R.  GI- Soft, NT, ND.  Extremities- R groin with approx 3in x 1.5in firm hematoma with extensive bruising. 2+ DP pulse, foot is warm with normal movement    Radiology/Studies: US  Lower Ext Art Right Ltd Result Date: 06/22/2024 EXAM: ULTRASOUND OF THE ARTERIAL SYSTEM OF THE BILATERAL LOWER EXTREMITIES 06/22/2024 06:45:35 PM TECHNIQUE: Duplex ultrasound using B-mode/gray scaled imaging, Doppler spectral analysis and color flow Doppler was obtained of the bilateral lower extremities. COMPARISON: None available. CLINICAL HISTORY: Lump in the groin. FINDINGS: No evidence of arterial pseudoaneurysm or av fistula. . Soft Tissues: There is a mixed echogenicity collection anterior to the blood vessels in the palpable area. This measures 4.2 x 2.7  cm and is most compatible with a hematoma. IMPRESSION: 1. Mixed echogenicity collection in the palpable groin region measuring 4.2 x 2.7 cm, most compatible with a hematoma. Electronically signed by: Franky Crease MD MD 06/22/2024 08:25 PM EST RP Workstation: [id number]   EP STUDY Addendum Date: 06/21/2024 CONCLUSIONS: 1. Successful PVI. 2. 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 3. Successful ablation of the lateral mitral isthmus for atypical perimitral flutter. 4. Successful ablation of the cavotricuspid isthmus with bidirectional block achieved. 5. Intracardiac echo reveals mildly reduced LV systolic function, trivial pericardial effusion and mechanical aortic valve. 6. Successful loop recorder implant. 7. No early apparent complications. 8. Resume warfarin this evening. Fonda Kitty, MD, [facility], [facility] Cardiac Electrophysiology   Result Date: 06/21/2024 CONCLUSIONS: 1. Successful PVI. 2. 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 3. Successful ablation of the lateral mitral isthmus for atypical perimitral flutter. 4. Successful ablation of the cavotricuspid isthmus with bidirectional block achieved. 5. Intracardiac echo reveals mildly reduced LV systolic function, trivial pericardial effusion and mechanical aortic valve. 6. Successful loop recorder implant. 7. No early apparent complications. 8. Resume warfarin this evening. Fonda Kitty, MD, [facility], [facility] Cardiac Electrophysiology   EP PPM/ICD IMPLANT Addendum Date: 06/21/2024 CONCLUSIONS: 1. Successful PVI. 2. 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 3. Successful ablation of the lateral mitral isthmus for atypical perimitral flutter. 4. Successful ablation of the cavotricuspid isthmus with bidirectional block achieved. 5. Intracardiac echo reveals mildly reduced LV  systolic function, trivial pericardial effusion and mechanical aortic valve. 6. Successful loop recorder implant. 7. No early apparent complications. 8. Resume warfarin this evening. Fonda Kitty, MD, [facility], [facility] Cardiac Electrophysiology   Result Date: 06/21/2024 CONCLUSIONS: 1. Successful PVI. 2. 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 3. Successful ablation  of the lateral mitral isthmus for atypical perimitral flutter. 4. Successful ablation of the cavotricuspid isthmus with bidirectional block achieved. 5. Intracardiac echo reveals mildly reduced LV systolic function, trivial pericardial effusion and mechanical aortic valve. 6. Successful loop recorder implant. 7. No early apparent complications. 8. Resume warfarin this evening. Fonda Kitty, MD, [facility], [facility] Cardiac Electrophysiology    EKG: 06/17/2023 at 1524 - SR w rare PVC, LBBB  (personally reviewed)  TELEMETRY: SR in 50s (personally reviewed)  DEVICE HISTORY: none  Assessment/Plan:  #) R groin hematoma s/p AFib, aflutter ablation #) mechanical AVR on warfarin Patient presented to [facility] ER with knot, increased pain in R groin after ablation last week. Found to have 4.2 x 2.7cm hematoma by ultrasound. Manual pressure applied with improvement in size and tenderness, has since increased slightly. Good circulation in R foot.  INRs have been running slightly higher lately, 2.5 this morning after holding warfarin last night Likely he had small venous bleed d/t increased activity with slightly elevated INR Encouraged minimal activity.  Will adjust INR goal to 1.8-2, will discuss future dosing with pharmacy  If hematoma is stable or improved this afternoon, ok to discharge. Will reassess later today   Plan discussed with Dr. Kitty     For questions or updates, please contact [facility] Please consult [email or web address] for contact info under         Signed, Suzann Riddle, NP  06/23/2024 8:41 AM  I have seen, examined the patient, and reviewed the above assessment and plan.    HPI: Ryan [name] is a 59 y.o. male with a history of non obs CAD, aortic dissection s/p mechanical AVR (on warfarin), LBBB, CVA/TIA, parox AFib, HTN, HLD, OSA who presented to the ED with a chief complaint of right groin swelling and pain following catheter ablation.  Patient underwent ablation for atrial fibrillation, flutter, atrial tachycardia on 06/16/2024.  He recovered well over the weekend without any obvious issues.  He was called for routine follow-up on Monday (1/5) and had no complaints.  On Tuesday and Wednesday, he resumed usual activities, including riding his moped scooter and going out for errands.  After these activities, he noted swelling and bruising in his right groin which progressively worsened.  He relayed this to our outpatient office.  I spoke with him on the phone and encouraged him to come to the ER for evaluation.  Right Groin US :  IMPRESSION: 1. Mixed echogenicity collection in the palpable groin region measuring 4.2 x 2.7 cm, most compatible with a hematoma.  Assessment: Ryan [name] is a 59 y.o. male with a history of non obs CAD, aortic dissection s/p mechanical AVR (on warfarin), LBBB, CVA/TIA, parox AFib, HTN, HLD, OSA who presented to the ED with right groin pain and swelling following catheter ablation.  Ultrasound of the right groin was consistent with hematoma.  This was likely created in the setting of overuse, specifically riding moped, while supratherapeutic with warfarin.  Plan:  - We will touch base with her Coumadin  clinic regarding dosing of his warfarin so that he will run on the lower side of the therapeutic range.  We have given him strict activity restrictions for the next week to allow for groin healing.  If groin is stable throughout the day, then he is okay for discharge.  Fonda Kitty, MD 06/25/2024 1:47  PM         [1]  Allergies Allergen Reactions   Alitraq Rash   Iodinated Contrast Media Rash  Iodine Rash    CT dye   "

## 2024-06-23 NOTE — TOC CM/SW Note (Signed)
 Transition of Care [facility]) - Inpatient Brief Assessment   Patient Details  Name: Ryan [name] MRN: [id number] Date of Birth: 03-Dec-1965  Transition of Care [facility]) CM/SW Contact:    Daved JONETTA Hamilton, RN Phone Number: 06/23/2024, 1:26 PM   Clinical Narrative:   Transition of Care [facility]) Screening Note   Patient Details  Name: Ryan [name] Date of Birth: 1966-01-23   Transition of Care [facility]) CM/SW Contact:    Daved JONETTA Hamilton, RN Phone Number: 06/23/2024, 1:26 PM    Transition of Care Department [facility]) has reviewed patient and no TOC needs have been identified at this time.  If new patient transition needs arise, please place a TOC consult.    Transition of Care Asessment: Insurance and Status: Insurance coverage has been reviewed Patient has primary care physician: Yes   Prior level of function:: independent Prior/Current Home Services: No current home services Social Drivers of Health Review: SDOH reviewed no interventions necessary Readmission risk has been reviewed: No (patient is in Observation, no score generated) Transition of care needs: no transition of care needs at this time

## 2024-06-28 ENCOUNTER — Ambulatory Visit

## 2024-06-28 ENCOUNTER — Ambulatory Visit (INDEPENDENT_AMBULATORY_CARE_PROVIDER_SITE_OTHER): Admitting: Cardiology

## 2024-06-28 ENCOUNTER — Encounter: Payer: Self-pay | Admitting: Cardiology

## 2024-06-28 VITALS — BP 120/64 | HR 58 | Ht 67.5 in | Wt 174.0 lb

## 2024-06-28 DIAGNOSIS — Z5181 Encounter for therapeutic drug level monitoring: Secondary | ICD-10-CM

## 2024-06-28 DIAGNOSIS — D6869 Other thrombophilia: Secondary | ICD-10-CM

## 2024-06-28 DIAGNOSIS — Z7901 Long term (current) use of anticoagulants: Secondary | ICD-10-CM

## 2024-06-28 DIAGNOSIS — I480 Paroxysmal atrial fibrillation: Secondary | ICD-10-CM | POA: Diagnosis not present

## 2024-06-28 DIAGNOSIS — Z952 Presence of prosthetic heart valve: Secondary | ICD-10-CM

## 2024-06-28 DIAGNOSIS — S3012XD Contusion of groin, subsequent encounter: Secondary | ICD-10-CM

## 2024-06-28 LAB — POCT INR: INR: 1.6 — AB (ref 2.0–3.0)

## 2024-06-28 NOTE — Progress Notes (Signed)
 "     Electrophysiology Clinic Note    Date:  06/28/2024  Patient ID:  [name], Ryan June 09, 1966, MRN [id number] PCP:  Melvin Pao, NP  Cardiologist:  Lonni Hanson, MD  Cardiology APP:  Gerard Frederick, NP  Electrophysiologist:  Fonda Kitty, MD  Electrophysiology APP:  Siddarth Hsiung, NP     Discussed the use of AI scribe software for clinical note transcription with the patient, who gave verbal consent to proceed.   Patient Profile    Chief Complaint: hospital follow-up  History of Present Illness: Ryan [name] is a 59 y.o. male with PMH notable for parox AFib, non obs CAD, HFrEF, aortic dissection s/p mechanical AVR (on warfarin), LBBB, CVA/TIA, HTN, HLD, OSA ; seen today for Fonda Kitty, MD for post hospital follow up.     He is s/p AF, aflutter ablation w isolation of pulm veins, posterior wall, lateral mitral isthmus, and CTI on 1/2 by Dr. Kitty. He presented to ER 1/8 with R groin hematoma with slightly elevated INR. Ultrasound without pseudoaneurysm, hematoma stable. Warfarin was held for two days, with resumption at lower dose.   On follow-up today, he believes his right groin hematoma has improved.  It is no longer painful to the touch at all.  He has noticed significant improvement in his bruising. He continues to take 2.5 mg warfarin daily.  Saw Coumadin  clinic earlier today where INR was 1.6 with plans to take 5 mg today and then subsequent 2.5 mg daily.   He is not aware of any atrial fibrillation episodes.  He does note that since he has been staying at home and limiting his activities that he has been eating significantly more than he usually does.    Arrhythmia/Device History MDT ILR, imp 06/2024; dx AF mgmt    ROS:  Please see the history of present illness. All other systems are reviewed and otherwise negative.    Physical Exam    VS:  BP 120/64 (BP Location: Left Arm, Patient Position: Sitting, Cuff Size: Normal)   Pulse (!) 58   Ht 5' 7.5  (1.715 m)   Wt 174 lb (78.9 kg)   SpO2 97%   BMI 26.85 kg/m  BMI: Body mass index is 26.85 kg/m.           Wt Readings from Last 3 Encounters:  06/28/24 174 lb (78.9 kg)  06/23/24 169 lb 12.1 oz (77 kg)  06/16/24 170 lb (77.1 kg)      GEN- The patient is well appearing, alert and oriented x 3 today.   Lungs- Clear to ausculation bilaterally, normal work of breathing.  Heart- Regular rate and rhythm, no murmurs, rubs or gallops Extremities- No peripheral edema, warm, dry. R groin with hematoma present, slightly smaller than previous. L groin with lima-bean sized knot. Neither area is tender to palpation Skin-  IRL site with old bruising, incision healing well    1/14 remote device interrogation reviewed No AFib 4.2% PVC   Studies Reviewed   Previous EP, cardiology notes.    EKG is not ordered. Personal review of EKG from 06/17/2023 shows:  SR w rare PVC         R lower extremity ultrasound, 06/22/2024 1. Mixed echogenicity collection in the palpable groin region measuring 4.2 x 2.7 cm, most compatible with a hematoma.  TTE, 09/08/2023  1. Left ventricular ejection fraction, by estimation, is 35 to 40%. The left ventricle has moderately decreased function. Left ventricular endocardial border not optimally  defined to evaluate regional wall motion. The left ventricular internal cavity size was moderately dilated. There is moderate left ventricular hypertrophy. Left ventricular diastolic parameters are indeterminate.   2. Right ventricular systolic function is mildly reduced. The right ventricular size is normal.   3. Left atrial size was severely dilated.   4. The mitral valve is normal in structure. Mild mitral valve regurgitation. No evidence of mitral stenosis.   5. The aortic valve is normal in structure. Aortic valve regurgitation is not visualized. No aortic stenosis is present. There is a mechanical valve present in the aortic position. Procedure Date: 2001.   6. The  inferior vena cava is normal in size with greater than 50% respiratory variability, suggesting right atrial pressure of 3 mmHg.   Coronary CT, 10/23/2022 1. Coronary calcium score of 159. This was 85th percentile for age-, sex, and race-matched controls.  2. Total plaque volume 151 mm3 which is 51st percentile for age- and sex-matched controls (calcified plaque 16 mm3; non-calcified plaque 135 mm3). TPV is moderate.  3. Normal coronary origin with right dominance.  4. Minimal CAD (<25%) in the LAD/LCX/RCA.  5. 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 Stable compared to prior CTAs in 2023/2024.  6. S/p ascending aortic repair.  7. 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  TTE, 10/12/2022  1. Left ventricular ejection fraction, by estimation, is 40 to 45%. The left ventricle has mildly decreased function. Left ventricular endocardial border not optimally defined to evaluate regional wall motion. There is moderate left ventricular hypertrophy. Left ventricular diastolic parameters are indeterminate.   2. Right ventricular systolic function is normal. The right ventricular size is normal.   3. Left atrial size was moderately dilated.   4. Right atrial size was mildly dilated.   5. The mitral valve is normal in structure. Mild mitral valve regurgitation. No evidence of mitral stenosis.   6. The aortic valve has been repaired/replaced. Aortic valve regurgitation is trivial. No aortic stenosis is present. Echo findings are consistent with normal structure and function of the aortic valve prosthesis. Aortic valve mean gradient measures 9.5 mmHg.   7. Technically difficult study due to poor echo windows and suboptimal parasternal window.   Assessment and Plan     #) R groin hematoma #) parox AFib #) AFlutter #) ILR in situ S/p AF, aflutter ablation 1/2 by Dr. Kennyth Right groin hematoma appears to be  improving Continue to limit physical activity.   Recommend he drive as little as possible, work note provided No atrial fibrillation on ILR  #) Hypercoag d/t parox afib CHA2DS2-VASc Score = at least 5 [CHF History: 1, HTN History: 1, Diabetes History: 0, Stroke History: 2, Vascular Disease History: 1, Age Score: 0, Gender Score: 0].  Therefore, the patient's annual risk of stroke is 7.2 %.    Stroke ppx -warfarin, managed at [facility] heart care office.  INR earlier today 1.6 No new bleeding concerns       Current medicines are reviewed at length with the patient today.   The patient does not have concerns regarding his medicines.  The following changes were made today:  none  Labs/ tests ordered today include:  No orders of the defined types were placed in this encounter.    Disposition: Follow up with EP APP as scheduled in 2 weeks    Signed, Tanyla Stege, NP  06/28/2024  2:44 PM  Electrophysiology [facility] "

## 2024-06-28 NOTE — Patient Instructions (Signed)
 Take 1 tablet today then  Continue 1/2 tablet every day.   [phone];   Recheck INR in 1 week

## 2024-06-28 NOTE — Patient Instructions (Signed)
 Medication Instructions:  Your physician recommends that you continue on your current medications as directed. Please refer to the Current Medication list given to you today.  *If you need a refill on your cardiac medications before your next appointment, please call your pharmacy*  Lab Work: No labs ordered today    Testing/Procedures: No test ordered today   Follow-Up:  Continue to stay out of work and limit driving until your next follow up appointment.   At [facility], you and your health needs are our priority.  As part of our continuing mission to provide you with exceptional heart care, our providers are all part of one team.  This team includes your primary Cardiologist (physician) and Advanced Practice Providers or APPs (Physician Assistants and Nurse Practitioners) who all work together to provide you with the care you need, when you need it.  Your next appointment:    Monday 07/17/2024 at 1: 55 pm with Suzann Riddle, NP

## 2024-07-03 ENCOUNTER — Other Ambulatory Visit: Payer: Self-pay | Admitting: Internal Medicine

## 2024-07-03 DIAGNOSIS — I480 Paroxysmal atrial fibrillation: Secondary | ICD-10-CM

## 2024-07-04 NOTE — Progress Notes (Unsigned)
 "     Electrophysiology Clinic Note    Date:  07/05/2024  Patient ID:  Ryan [name], Ryan [name] Apr 17, 1966, MRN [id number] PCP:  Melvin Pao, NP  Cardiologist:  Lonni Hanson, MD  Cardiology APP:  Gerard Frederick, NP  Electrophysiologist:  Fonda Kitty, MD  Electrophysiology APP:  Elanie Hammitt, NP     Discussed the use of AI scribe software for clinical note transcription with the patient, who gave verbal consent to proceed.   Patient Profile    Chief Complaint: hospital follow-up  History of Present Illness: Ryan [name] is a 59 y.o. 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    He is s/p AF, aflutter ablation w isolation of pulm veins, posterior wall, lateral mitral isthmus, and CTI on 1/2 by Dr. Kitty. He presented to ER 1/8 with R groin hematoma with slightly elevated INR. Ultrasound without pseudoaneurysm, hematoma stable. Warfarin was held for two days, with resumption at lower dose.  I saw him 1 week ago for hospital follow-up where right groin hematoma was stable, bruising had significantly improved as had his pain.  He called clinic yesterday stating that his right groin was becoming more painful and he was having some pain on his outer right thigh.  Today, he tells me that he was getting very bored at the house following activity restrictions and went to church on 1/18.  After that, he noticed that his right groin was slightly more painful.  Since that time, he has resumed his activity restrictions.  He does endorse that he is very anxious about this post postprocedural complication.  No reduced sensation, no numbness or cold-feeling in his right foot. He has INR appt directly following my appt.   He is not aware of any AF episodes.    Arrhythmia/Device History MDT ILR, imp 06/2024; dx AF mgmt    ROS:  Please  see the history of present illness. All other systems are reviewed and otherwise negative.    Physical Exam    VS:  BP 126/62 (BP Location: Left Arm, Patient Position: Sitting, Cuff Size: Normal)   Pulse (!) 54   Ht 5' 7 (1.702 m)   Wt 179 lb (81.2 kg)   SpO2 98%   BMI 28.04 kg/m  BMI: Body mass index is 28.04 kg/m.           Wt Readings from Last 3 Encounters:  07/05/24 179 lb (81.2 kg)  06/28/24 174 lb (78.9 kg)  06/23/24 169 lb 12.1 oz (77 kg)      GEN- The patient is well appearing, alert and oriented x 3 today.   Lungs- Clear to ausculation bilaterally, normal work of breathing.  Heart- Regular rate and rhythm, no murmurs, rubs or gallops Extremities- No peripheral edema, warm, dry. 2+ pedal pulse. R groin with hematoma present, appears stable at about the size of two golf balls. No ecchymosis, Very slightly TTP  Skin-  incision healing well   1/21 remote device interrogation reviewed No AFib    Studies Reviewed   Previous EP, cardiology notes.    EKG is not ordered. Personal review of EKG from 06/17/2023 shows:  SR w rare PVC         R lower extremity ultrasound, 06/22/2024 1. Mixed echogenicity collection in the palpable groin region measuring 4.2 x 2.7 cm, most  compatible with a hematoma.  TTE, 09/08/2023  1. Left ventricular ejection fraction, by estimation, is 35 to 40%. The left ventricle has moderately decreased function. Left ventricular endocardial border not optimally defined to evaluate regional wall motion. The left ventricular internal cavity size was moderately dilated. There is moderate left ventricular hypertrophy. Left ventricular diastolic parameters are indeterminate.   2. Right ventricular systolic function is mildly reduced. The right ventricular size is normal.   3. Left atrial size was severely dilated.   4. The mitral valve is normal in structure. Mild mitral valve regurgitation. No evidence of mitral stenosis.   5. The aortic valve is  normal in structure. Aortic valve regurgitation is not visualized. No aortic stenosis is present. There is a mechanical valve present in the aortic position. Procedure Date: 2001.   6. The inferior vena cava is normal in size with greater than 50% respiratory variability, suggesting right atrial pressure of 3 mmHg.   Coronary CT, 10/23/2022 1. Coronary calcium score of 159. This was 85th percentile for age-, sex, and race-matched controls.  2. Total plaque volume 151 mm3 which is 51st percentile for age- and sex-matched controls (calcified plaque 16 mm3; non-calcified plaque 135 mm3). TPV is moderate.  3. Normal coronary origin with right dominance.  4. Minimal CAD (<25%) in the LAD/LCX/RCA.  5. 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 Stable compared to prior CTAs in 2023/2024.  6. S/p ascending aortic repair.  7. 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  TTE, 10/12/2022  1. Left ventricular ejection fraction, by estimation, is 40 to 45%. The left ventricle has mildly decreased function. Left ventricular endocardial border not optimally defined to evaluate regional wall motion. There is moderate left ventricular hypertrophy. Left ventricular diastolic parameters are indeterminate.   2. Right ventricular systolic function is normal. The right ventricular size is normal.   3. Left atrial size was moderately dilated.   4. Right atrial size was mildly dilated.   5. The mitral valve is normal in structure. Mild mitral valve regurgitation. No evidence of mitral stenosis.   6. The aortic valve has been repaired/replaced. Aortic valve regurgitation is trivial. No aortic stenosis is present. Echo findings are consistent with normal structure and function of the aortic valve prosthesis. Aortic valve mean gradient measures 9.5 mmHg.   7. Technically difficult study due to poor echo windows and suboptimal parasternal  window.   Assessment and Plan     #) R groin hematoma #) parox AFib #) AFlutter #) ILR in situ S/p AF, aflutter ablation 1/2 by Dr. Kennyth Right groin hematoma stable, reassurance provided Reiterated limiting physical activity No atrial fibrillation on ILR  #) Hypercoag d/t parox afib CHA2DS2-VASc Score = at least 5 [CHF History: 1, HTN History: 1, Diabetes History: 0, Stroke History: 2, Vascular Disease History: 1, Age Score: 0, Gender Score: 0].  Therefore, the patient's annual risk of stroke is 7.2 %.    Stroke ppx -warfarin, managed at [facility] heart care office.   INR 2.2, continuing 2.5mg  coumadin  daily      Current medicines are reviewed at length with the patient today.   The patient does not have concerns regarding his medicines.  The following changes were made today:  none  Labs/ tests ordered today include:  No orders of the defined types were placed in this encounter.    Disposition: Follow up with EP APP as usual  post procedure   Signed, Kailia Starry, NP  07/05/24  1:02 PM  Electrophysiology [facility] "

## 2024-07-04 NOTE — Telephone Encounter (Signed)
 Called to advise patient of 10:40 appointment with Suzann Riddle, NP, will notify Ozell of appointment and following appointment with INR

## 2024-07-04 NOTE — Telephone Encounter (Signed)
 Pain is spreading down to right leg, makes it unbearable to move and walk - states it's 7-8/10 with any movement - patient states knot has not changed at all in size  He has 11:15 appointment with Ozell for INR  You do have open OV at 10:40   Please advise

## 2024-07-05 ENCOUNTER — Ambulatory Visit: Attending: Internal Medicine

## 2024-07-05 ENCOUNTER — Encounter: Payer: Self-pay | Admitting: Cardiology

## 2024-07-05 ENCOUNTER — Ambulatory Visit (INDEPENDENT_AMBULATORY_CARE_PROVIDER_SITE_OTHER): Admitting: Cardiology

## 2024-07-05 VITALS — BP 126/62 | HR 54 | Ht 67.0 in | Wt 179.0 lb

## 2024-07-05 DIAGNOSIS — S3012XD Contusion of groin, subsequent encounter: Secondary | ICD-10-CM | POA: Diagnosis not present

## 2024-07-05 DIAGNOSIS — D6869 Other thrombophilia: Secondary | ICD-10-CM

## 2024-07-05 DIAGNOSIS — Z5181 Encounter for therapeutic drug level monitoring: Secondary | ICD-10-CM

## 2024-07-05 DIAGNOSIS — Z952 Presence of prosthetic heart valve: Secondary | ICD-10-CM

## 2024-07-05 DIAGNOSIS — I480 Paroxysmal atrial fibrillation: Secondary | ICD-10-CM | POA: Diagnosis not present

## 2024-07-05 DIAGNOSIS — Z7901 Long term (current) use of anticoagulants: Secondary | ICD-10-CM

## 2024-07-05 LAB — POCT INR: INR: 2.2 (ref 2.0–3.0)

## 2024-07-05 NOTE — Patient Instructions (Signed)
 Continue 0.5 tablet daily, except 1 tablet every Monday, Wednesday and Friday.   [phone];   Recheck INR in 3 weeks

## 2024-07-05 NOTE — Patient Instructions (Signed)
 Medication Instructions:  Your physician recommends that you continue on your current medications as directed. Please refer to the Current Medication list given to you today.  *If you need a refill on your cardiac medications before your next appointment, please call your pharmacy*  Lab Work: No labs ordered today    Testing/Procedures: No test ordered today   Follow-Up: At [facility], you and your health needs are our priority.  As part of our continuing mission to provide you with exceptional heart care, our providers are all part of one team.  This team includes your primary Cardiologist (physician) and Advanced Practice Providers or APPs (Physician Assistants and Nurse Practitioners) who all work together to provide you with the care you need, when you need it.  Your next appointment:    Scheduled with Suzann Riddle, NP on 07/17/2024

## 2024-07-16 NOTE — Progress Notes (Unsigned)
 "     Electrophysiology Clinic Note    Date:  07/17/2024  Patient ID:  Ryan [name], Ryan [name] 07/23/1965, MRN [id number] PCP:  Melvin Pao, NP  Cardiologist:  Lonni Hanson, MD  Cardiology APP:  Gerard Frederick, NP  Electrophysiologist:  Fonda Kitty, MD  Electrophysiology APP:  Guneet Delpino, NP     Discussed the use of AI scribe software for clinical note transcription with the patient, who gave verbal consent to proceed.   Patient Profile    Chief Complaint: Af ablation follow-up  History of Present Illness: Ryan [name] is a 59 y.o. 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    He is s/p AF, aflutter ablation w isolation of pulm veins, posterior wall, lateral mitral isthmus, and CTI on 1/2 by Dr. Kitty.  Procedure complicated by R groin hematoma.   Today, he is not aware of any AF episodes. His R groin is significantly improved, not having any pain or tenderness. He has continued to limit his activities, has not returned to work yet.  He denies bleeding concerns. He is taking 5mg  warfarin MWF, 2.5mg  all other days.      Arrhythmia/Device History MDT ILR, imp 06/2024; dx AF mgmt    ROS:  Please see the history of present illness. All other systems are reviewed and otherwise negative.    Physical Exam    VS:  BP 124/68 (BP Location: Left Arm, Patient Position: Sitting, Cuff Size: Normal)   Wt 181 lb 6.4 oz (82.3 kg)   SpO2 97%   BMI 28.41 kg/m  BMI: Body mass index is 28.41 kg/m.           Wt Readings from Last 3 Encounters:  07/17/24 181 lb 6.4 oz (82.3 kg)  07/05/24 179 lb (81.2 kg)  06/28/24 174 lb (78.9 kg)      GEN- The patient is well appearing, alert and oriented x 3 today.   Lungs- Clear to ausculation bilaterally, normal work of breathing.  Heart- Regular rate and rhythm, no murmurs,  rubs or gallops Extremities- No peripheral edema, warm, dry. 2+ pedal pulse. R groin - no bruising or tenderness, small area of swelling    2/2 remote device interrogation reviewed No AFib PVC - 5.2%     Studies Reviewed   Previous EP, cardiology notes.    EKG is ordered. Personal review of EKG from today shows:   EKG Interpretation Date/Time:  Monday July 17 2024 13:55:20 EST Ventricular Rate:  52 PR Interval:  152 QRS Duration:  124 QT Interval:  458 QTC Calculation: 425 R Axis:   22  Text Interpretation: Sinus bradycardia Left bundle branch block Confirmed by Mylan Lengyel [phone]) on 07/17/2024 1:58:00 PM     R lower extremity ultrasound, 06/22/2024 1. Mixed echogenicity collection in the palpable groin region measuring 4.2 x 2.7 cm, most compatible with a hematoma.  TTE, 09/08/2023  1. Left ventricular ejection fraction, by estimation, is 35 to 40%. The left ventricle has moderately decreased function. Left ventricular endocardial border not optimally defined to evaluate regional wall motion. The left ventricular internal cavity size was moderately dilated. There is moderate left ventricular hypertrophy. Left ventricular diastolic parameters are indeterminate.   2. Right ventricular systolic function is mildly reduced. The right ventricular size is normal.   3. Left atrial size was severely  dilated.   4. The mitral valve is normal in structure. Mild mitral valve regurgitation. No evidence of mitral stenosis.   5. The aortic valve is normal in structure. Aortic valve regurgitation is not visualized. No aortic stenosis is present. There is a mechanical valve present in the aortic position. Procedure Date: 2001.   6. The inferior vena cava is normal in size with greater than 50% respiratory variability, suggesting right atrial pressure of 3 mmHg.   Coronary CT, 10/23/2022 1. Coronary calcium score of 159. This was 85th percentile for age-, sex, and race-matched controls.  2.  Total plaque volume 151 mm3 which is 51st percentile for age- and sex-matched controls (calcified plaque 16 mm3; non-calcified plaque 135 mm3). TPV is moderate.  3. Normal coronary origin with right dominance.  4. Minimal CAD (<25%) in the LAD/LCX/RCA.  5. 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 Stable compared to prior CTAs in 2023/2024.  6. S/p ascending aortic repair.  7. 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  TTE, 10/12/2022  1. Left ventricular ejection fraction, by estimation, is 40 to 45%. The left ventricle has mildly decreased function. Left ventricular endocardial border not optimally defined to evaluate regional wall motion. There is moderate left ventricular hypertrophy. Left ventricular diastolic parameters are indeterminate.   2. Right ventricular systolic function is normal. The right ventricular size is normal.   3. Left atrial size was moderately dilated.   4. Right atrial size was mildly dilated.   5. The mitral valve is normal in structure. Mild mitral valve regurgitation. No evidence of mitral stenosis.   6. The aortic valve has been repaired/replaced. Aortic valve regurgitation is trivial. No aortic stenosis is present. Echo findings are consistent with normal structure and function of the aortic valve prosthesis. Aortic valve mean gradient measures 9.5 mmHg.   7. Technically difficult study due to poor echo windows and suboptimal parasternal window.   Assessment and Plan     #) R groin hematoma #) parox AFib #) AFlutter #) ILR in situ S/p AF, aflutter ablation 1/2 by Dr. Kennyth No recurrence of AFib via ILR Right groin hematoma significantly improved OK to return to work on limited hours. If R groin increases in pain, swelling, he is to notify office   #) Hypercoag d/t parox afib CHA2DS2-VASc Score = at least 5 [CHF History: 1, HTN History: 1, Diabetes History: 0,  Stroke History: 2, Vascular Disease History: 1, Age Score: 0, Gender Score: 0].  Therefore, the patient's annual risk of stroke is 7.2 %.    Stroke ppx -warfarin, managed at [facility] heart care office.    #) HFrEF Appears warm and euvolemic on exam Maintainining sinus rhythm     Current medicines are reviewed at length with the patient today.   The patient does not have concerns regarding his medicines.  The following changes were made today:  none  Labs/ tests ordered today include:  Orders Placed This Encounter  Procedures   EKG 12-Lead     Disposition: Follow up with EP APP in 2 months    Signed, Lorri Fukuhara, NP  07/17/24  2:40 PM  Electrophysiology [facility] "

## 2024-07-17 ENCOUNTER — Ambulatory Visit: Admitting: Cardiology

## 2024-07-17 ENCOUNTER — Encounter: Payer: Self-pay | Admitting: Cardiology

## 2024-07-17 ENCOUNTER — Ambulatory Visit

## 2024-07-17 VITALS — BP 124/68 | Wt 181.4 lb

## 2024-07-17 DIAGNOSIS — I5020 Unspecified systolic (congestive) heart failure: Secondary | ICD-10-CM

## 2024-07-17 DIAGNOSIS — I480 Paroxysmal atrial fibrillation: Secondary | ICD-10-CM | POA: Diagnosis not present

## 2024-07-17 DIAGNOSIS — Z7901 Long term (current) use of anticoagulants: Secondary | ICD-10-CM

## 2024-07-17 DIAGNOSIS — I4892 Unspecified atrial flutter: Secondary | ICD-10-CM

## 2024-07-17 DIAGNOSIS — S3012XD Contusion of groin, subsequent encounter: Secondary | ICD-10-CM

## 2024-07-17 NOTE — Patient Instructions (Addendum)
 Medication Instructions:  Your physician recommends that you continue on your current medications as directed. Please refer to the Current Medication list given to you today.    *If you need a refill on your cardiac medications before your next appointment, please call your pharmacy*  Lab Work: No labs ordered today    Testing/Procedures: No test ordered today   Follow-Up: At [facility], you and your health needs are our priority.  As part of our continuing mission to provide you with exceptional heart care, our providers are all part of one team.  This team includes your primary Cardiologist (physician) and Advanced Practice Providers or APPs (Physician Assistants and Nurse Practitioners) who all work together to provide you with the care you need, when you need it.  Your next appointment:   2 month(s)  Provider:   Suzann Riddle, NP

## 2024-07-18 LAB — CUP PACEART REMOTE DEVICE CHECK
Date Time Interrogation Session: 20260202210618
Implantable Pulse Generator Implant Date: 20260102

## 2024-07-19 ENCOUNTER — Ambulatory Visit

## 2024-07-26 ENCOUNTER — Ambulatory Visit

## 2024-08-17 ENCOUNTER — Ambulatory Visit

## 2024-09-06 ENCOUNTER — Ambulatory Visit: Admitting: Cardiology

## 2024-09-13 ENCOUNTER — Ambulatory Visit: Admitting: Cardiology

## 2024-09-17 ENCOUNTER — Ambulatory Visit

## 2024-10-18 ENCOUNTER — Ambulatory Visit

## 2024-11-18 ENCOUNTER — Ambulatory Visit

## 2024-12-19 ENCOUNTER — Ambulatory Visit

## 2025-01-19 ENCOUNTER — Ambulatory Visit

## 2025-02-19 ENCOUNTER — Ambulatory Visit

## 2025-03-22 ENCOUNTER — Ambulatory Visit

## 2025-04-22 ENCOUNTER — Ambulatory Visit
